# Patient Record
Sex: Female | Born: 1985
Health system: Southern US, Community
[De-identification: ages and names within clinical notes are randomized; demographics above are authoritative.]

## PROBLEM LIST (undated history)

## (undated) ENCOUNTER — Inpatient Hospital Stay (HOSPITAL_COMMUNITY): Payer: Self-pay

## (undated) ENCOUNTER — Emergency Department (HOSPITAL_COMMUNITY): Payer: Medicaid Other

## (undated) DIAGNOSIS — N390 Urinary tract infection, site not specified: Secondary | ICD-10-CM

## (undated) DIAGNOSIS — D649 Anemia, unspecified: Secondary | ICD-10-CM

## (undated) DIAGNOSIS — F329 Major depressive disorder, single episode, unspecified: Secondary | ICD-10-CM

## (undated) DIAGNOSIS — F32A Depression, unspecified: Secondary | ICD-10-CM

## (undated) DIAGNOSIS — A599 Trichomoniasis, unspecified: Secondary | ICD-10-CM

## (undated) DIAGNOSIS — R51 Headache: Secondary | ICD-10-CM

## (undated) DIAGNOSIS — A749 Chlamydial infection, unspecified: Secondary | ICD-10-CM

## (undated) DIAGNOSIS — O139 Gestational [pregnancy-induced] hypertension without significant proteinuria, unspecified trimester: Secondary | ICD-10-CM

## (undated) DIAGNOSIS — R87629 Unspecified abnormal cytological findings in specimens from vagina: Secondary | ICD-10-CM

## (undated) DIAGNOSIS — D219 Benign neoplasm of connective and other soft tissue, unspecified: Secondary | ICD-10-CM

## (undated) DIAGNOSIS — A549 Gonococcal infection, unspecified: Secondary | ICD-10-CM

## (undated) DIAGNOSIS — IMO0002 Reserved for concepts with insufficient information to code with codable children: Secondary | ICD-10-CM

## (undated) HISTORY — PX: GYNECOLOGIC CRYOSURGERY: SHX857

## (undated) HISTORY — PX: FOOT SURGERY: SHX648

## (undated) HISTORY — PX: LIPOSUCTION: SHX10

## (undated) HISTORY — PX: HAMMER TOE SURGERY: SHX385

---
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
## 2003-05-24 ENCOUNTER — Emergency Department (HOSPITAL_COMMUNITY): Admission: EM | Admit: 2003-05-24 | Discharge: 2003-05-24 | Payer: Self-pay | Admitting: Emergency Medicine

## 2003-06-11 ENCOUNTER — Emergency Department (HOSPITAL_COMMUNITY): Admission: EM | Admit: 2003-06-11 | Discharge: 2003-06-11 | Payer: Self-pay | Admitting: *Deleted

## 2003-06-11 ENCOUNTER — Encounter: Payer: Self-pay | Admitting: Emergency Medicine

## 2003-10-24 ENCOUNTER — Emergency Department (HOSPITAL_COMMUNITY): Admission: EM | Admit: 2003-10-24 | Discharge: 2003-10-24 | Payer: Self-pay | Admitting: Emergency Medicine

## 2003-12-24 ENCOUNTER — Emergency Department (HOSPITAL_COMMUNITY): Admission: EM | Admit: 2003-12-24 | Discharge: 2003-12-24 | Payer: Self-pay | Admitting: Emergency Medicine

## 2004-03-03 ENCOUNTER — Emergency Department (HOSPITAL_COMMUNITY): Admission: EM | Admit: 2004-03-03 | Discharge: 2004-03-03 | Payer: Self-pay | Admitting: Emergency Medicine

## 2004-09-20 ENCOUNTER — Emergency Department (HOSPITAL_COMMUNITY): Admission: EM | Admit: 2004-09-20 | Discharge: 2004-09-20 | Payer: Self-pay | Admitting: Family Medicine

## 2005-04-16 ENCOUNTER — Inpatient Hospital Stay (HOSPITAL_COMMUNITY): Admission: AD | Admit: 2005-04-16 | Discharge: 2005-04-16 | Payer: Self-pay

## 2005-05-16 ENCOUNTER — Other Ambulatory Visit: Admission: RE | Admit: 2005-05-16 | Discharge: 2005-05-16 | Payer: Self-pay | Admitting: Obstetrics and Gynecology

## 2005-05-27 ENCOUNTER — Inpatient Hospital Stay (HOSPITAL_COMMUNITY): Admission: AD | Admit: 2005-05-27 | Discharge: 2005-05-27 | Payer: Self-pay | Admitting: Obstetrics and Gynecology

## 2005-06-05 ENCOUNTER — Inpatient Hospital Stay (HOSPITAL_COMMUNITY): Admission: AD | Admit: 2005-06-05 | Discharge: 2005-06-05 | Payer: Self-pay | Admitting: Obstetrics and Gynecology

## 2005-09-23 ENCOUNTER — Inpatient Hospital Stay (HOSPITAL_COMMUNITY): Admission: AD | Admit: 2005-09-23 | Discharge: 2005-09-23 | Payer: Self-pay | Admitting: Obstetrics and Gynecology

## 2005-09-26 ENCOUNTER — Inpatient Hospital Stay (HOSPITAL_COMMUNITY): Admission: AD | Admit: 2005-09-26 | Discharge: 2005-09-26 | Payer: Self-pay | Admitting: Obstetrics and Gynecology

## 2005-09-27 ENCOUNTER — Inpatient Hospital Stay (HOSPITAL_COMMUNITY): Admission: AD | Admit: 2005-09-27 | Discharge: 2005-09-27 | Payer: Self-pay | Admitting: Obstetrics & Gynecology

## 2005-10-01 ENCOUNTER — Inpatient Hospital Stay (HOSPITAL_COMMUNITY): Admission: AD | Admit: 2005-10-01 | Discharge: 2005-10-01 | Payer: Self-pay | Admitting: Obstetrics and Gynecology

## 2005-10-09 ENCOUNTER — Encounter (INDEPENDENT_AMBULATORY_CARE_PROVIDER_SITE_OTHER): Payer: Self-pay | Admitting: *Deleted

## 2005-11-04 ENCOUNTER — Inpatient Hospital Stay (HOSPITAL_COMMUNITY): Admission: AD | Admit: 2005-11-04 | Discharge: 2005-11-04 | Payer: Self-pay | Admitting: Obstetrics and Gynecology

## 2005-11-05 ENCOUNTER — Inpatient Hospital Stay (HOSPITAL_COMMUNITY): Admission: AD | Admit: 2005-11-05 | Discharge: 2005-11-08 | Payer: Self-pay | Admitting: Obstetrics and Gynecology

## 2005-11-06 ENCOUNTER — Encounter (INDEPENDENT_AMBULATORY_CARE_PROVIDER_SITE_OTHER): Payer: Self-pay | Admitting: *Deleted

## 2006-01-09 ENCOUNTER — Inpatient Hospital Stay (HOSPITAL_COMMUNITY): Admission: AD | Admit: 2006-01-09 | Discharge: 2006-01-10 | Payer: Self-pay | Admitting: Obstetrics and Gynecology

## 2006-01-24 ENCOUNTER — Inpatient Hospital Stay (HOSPITAL_COMMUNITY): Admission: AD | Admit: 2006-01-24 | Discharge: 2006-01-24 | Payer: Self-pay | Admitting: Obstetrics & Gynecology

## 2006-03-08 ENCOUNTER — Ambulatory Visit: Payer: Self-pay | Admitting: Family Medicine

## 2006-08-08 ENCOUNTER — Ambulatory Visit: Payer: Self-pay | Admitting: Family Medicine

## 2006-09-13 ENCOUNTER — Ambulatory Visit: Payer: Self-pay | Admitting: *Deleted

## 2006-12-07 ENCOUNTER — Encounter (INDEPENDENT_AMBULATORY_CARE_PROVIDER_SITE_OTHER): Payer: Self-pay | Admitting: *Deleted

## 2007-01-31 ENCOUNTER — Emergency Department (HOSPITAL_COMMUNITY): Admission: EM | Admit: 2007-01-31 | Discharge: 2007-02-01 | Payer: Self-pay | Admitting: Emergency Medicine

## 2007-02-11 ENCOUNTER — Ambulatory Visit: Payer: Self-pay | Admitting: Sports Medicine

## 2007-02-11 DIAGNOSIS — J309 Allergic rhinitis, unspecified: Secondary | ICD-10-CM | POA: Insufficient documentation

## 2007-02-26 ENCOUNTER — Telehealth: Payer: Self-pay | Admitting: Psychology

## 2007-02-26 ENCOUNTER — Telehealth: Payer: Self-pay | Admitting: *Deleted

## 2007-03-08 ENCOUNTER — Encounter: Payer: Self-pay | Admitting: *Deleted

## 2007-05-07 ENCOUNTER — Telehealth: Payer: Self-pay | Admitting: *Deleted

## 2007-05-08 ENCOUNTER — Encounter (INDEPENDENT_AMBULATORY_CARE_PROVIDER_SITE_OTHER): Payer: Self-pay | Admitting: Family Medicine

## 2007-05-08 ENCOUNTER — Ambulatory Visit: Payer: Self-pay | Admitting: Family Medicine

## 2007-05-08 LAB — CONVERTED CEMR LAB
Bilirubin Urine: NEGATIVE
Glucose, Urine, Semiquant: NEGATIVE
Protein, U semiquant: NEGATIVE
pH: 6

## 2007-05-09 ENCOUNTER — Ambulatory Visit: Payer: Self-pay | Admitting: Family Medicine

## 2007-05-09 ENCOUNTER — Telehealth: Payer: Self-pay | Admitting: *Deleted

## 2007-05-27 ENCOUNTER — Telehealth: Payer: Self-pay | Admitting: *Deleted

## 2007-10-28 ENCOUNTER — Emergency Department (HOSPITAL_COMMUNITY): Admission: EM | Admit: 2007-10-28 | Discharge: 2007-10-28 | Payer: Self-pay | Admitting: Emergency Medicine

## 2007-12-10 ENCOUNTER — Inpatient Hospital Stay (HOSPITAL_COMMUNITY): Admission: AD | Admit: 2007-12-10 | Discharge: 2007-12-10 | Payer: Self-pay | Admitting: Obstetrics & Gynecology

## 2008-01-13 ENCOUNTER — Telehealth: Payer: Self-pay | Admitting: *Deleted

## 2008-01-28 ENCOUNTER — Ambulatory Visit: Payer: Self-pay | Admitting: Vascular Surgery

## 2008-01-28 ENCOUNTER — Telehealth: Payer: Self-pay | Admitting: *Deleted

## 2008-01-28 ENCOUNTER — Emergency Department (HOSPITAL_COMMUNITY): Admission: EM | Admit: 2008-01-28 | Discharge: 2008-01-28 | Payer: Self-pay | Admitting: Emergency Medicine

## 2008-01-28 ENCOUNTER — Encounter (INDEPENDENT_AMBULATORY_CARE_PROVIDER_SITE_OTHER): Payer: Self-pay | Admitting: Emergency Medicine

## 2008-02-09 ENCOUNTER — Emergency Department (HOSPITAL_COMMUNITY): Admission: EM | Admit: 2008-02-09 | Discharge: 2008-02-09 | Payer: Self-pay | Admitting: Emergency Medicine

## 2008-02-26 ENCOUNTER — Telehealth: Payer: Self-pay | Admitting: Family Medicine

## 2008-02-26 ENCOUNTER — Encounter: Payer: Self-pay | Admitting: *Deleted

## 2008-03-04 ENCOUNTER — Encounter (INDEPENDENT_AMBULATORY_CARE_PROVIDER_SITE_OTHER): Payer: Self-pay | Admitting: *Deleted

## 2008-03-04 ENCOUNTER — Other Ambulatory Visit: Admission: RE | Admit: 2008-03-04 | Discharge: 2008-03-04 | Payer: Self-pay | Admitting: Family Medicine

## 2008-03-04 ENCOUNTER — Ambulatory Visit: Payer: Self-pay | Admitting: Family Medicine

## 2008-03-04 DIAGNOSIS — E669 Obesity, unspecified: Secondary | ICD-10-CM | POA: Insufficient documentation

## 2008-03-05 ENCOUNTER — Encounter (INDEPENDENT_AMBULATORY_CARE_PROVIDER_SITE_OTHER): Payer: Self-pay | Admitting: *Deleted

## 2008-03-05 LAB — CONVERTED CEMR LAB
ALT: 11 units/L (ref 0–35)
CO2: 22 meq/L (ref 19–32)
Cholesterol: 204 mg/dL — ABNORMAL HIGH (ref 0–200)
GC Probe Amp, Genital: NEGATIVE
LDL Cholesterol: 134 mg/dL — ABNORMAL HIGH (ref 0–99)
Sodium: 138 meq/L (ref 135–145)
TSH: 1.464 microintl units/mL (ref 0.350–5.50)
Total Bilirubin: 0.4 mg/dL (ref 0.3–1.2)
Total Protein: 7.2 g/dL (ref 6.0–8.3)
VLDL: 22 mg/dL (ref 0–40)

## 2008-03-06 ENCOUNTER — Telehealth (INDEPENDENT_AMBULATORY_CARE_PROVIDER_SITE_OTHER): Payer: Self-pay | Admitting: *Deleted

## 2008-03-10 ENCOUNTER — Encounter (INDEPENDENT_AMBULATORY_CARE_PROVIDER_SITE_OTHER): Payer: Self-pay | Admitting: *Deleted

## 2008-03-15 ENCOUNTER — Emergency Department (HOSPITAL_COMMUNITY): Admission: EM | Admit: 2008-03-15 | Discharge: 2008-03-15 | Payer: Self-pay | Admitting: Emergency Medicine

## 2008-03-16 ENCOUNTER — Encounter (INDEPENDENT_AMBULATORY_CARE_PROVIDER_SITE_OTHER): Payer: Self-pay | Admitting: *Deleted

## 2008-06-25 ENCOUNTER — Telehealth: Payer: Self-pay | Admitting: Family Medicine

## 2008-07-06 ENCOUNTER — Encounter: Payer: Self-pay | Admitting: Family Medicine

## 2008-07-14 ENCOUNTER — Telehealth: Payer: Self-pay | Admitting: *Deleted

## 2008-07-16 ENCOUNTER — Ambulatory Visit: Payer: Self-pay | Admitting: Family Medicine

## 2008-07-16 ENCOUNTER — Encounter: Payer: Self-pay | Admitting: Family Medicine

## 2008-07-16 DIAGNOSIS — D649 Anemia, unspecified: Secondary | ICD-10-CM

## 2008-07-16 LAB — CONVERTED CEMR LAB
HCT: 36.9 % (ref 36.0–46.0)
Hemoglobin: 11.6 g/dL — ABNORMAL LOW (ref 12.0–15.0)
MCHC: 31.4 g/dL (ref 30.0–36.0)
MCV: 81.8 fL (ref 78.0–100.0)
Platelets: 259 10*3/uL (ref 150–400)
RBC: 4.51 M/uL (ref 3.87–5.11)
RDW: 13.1 % (ref 11.5–15.5)
WBC: 5.4 10*3/uL (ref 4.0–10.5)

## 2008-07-30 ENCOUNTER — Encounter: Payer: Self-pay | Admitting: *Deleted

## 2008-09-15 ENCOUNTER — Emergency Department (HOSPITAL_COMMUNITY): Admission: EM | Admit: 2008-09-15 | Discharge: 2008-09-15 | Payer: Self-pay | Admitting: Emergency Medicine

## 2008-11-26 ENCOUNTER — Inpatient Hospital Stay (HOSPITAL_COMMUNITY): Admission: AD | Admit: 2008-11-26 | Discharge: 2008-11-26 | Payer: Self-pay | Admitting: Family Medicine

## 2008-11-27 ENCOUNTER — Encounter: Payer: Self-pay | Admitting: Family Medicine

## 2008-11-29 ENCOUNTER — Inpatient Hospital Stay (HOSPITAL_COMMUNITY): Admission: AD | Admit: 2008-11-29 | Discharge: 2008-11-29 | Payer: Self-pay | Admitting: Obstetrics and Gynecology

## 2008-12-04 ENCOUNTER — Inpatient Hospital Stay (HOSPITAL_COMMUNITY): Admission: RE | Admit: 2008-12-04 | Discharge: 2008-12-04 | Payer: Self-pay | Admitting: Family Medicine

## 2008-12-15 ENCOUNTER — Other Ambulatory Visit: Admission: RE | Admit: 2008-12-15 | Discharge: 2008-12-15 | Payer: Self-pay | Admitting: Family Medicine

## 2008-12-15 ENCOUNTER — Encounter (INDEPENDENT_AMBULATORY_CARE_PROVIDER_SITE_OTHER): Payer: Self-pay | Admitting: Family Medicine

## 2008-12-15 ENCOUNTER — Telehealth: Payer: Self-pay | Admitting: Family Medicine

## 2008-12-15 ENCOUNTER — Ambulatory Visit: Payer: Self-pay | Admitting: Family Medicine

## 2008-12-15 LAB — CONVERTED CEMR LAB
Chlamydia, DNA Probe: NEGATIVE
Whiff Test: POSITIVE

## 2008-12-17 ENCOUNTER — Encounter (INDEPENDENT_AMBULATORY_CARE_PROVIDER_SITE_OTHER): Payer: Self-pay | Admitting: Family Medicine

## 2008-12-17 ENCOUNTER — Ambulatory Visit: Payer: Self-pay | Admitting: Family Medicine

## 2008-12-17 ENCOUNTER — Encounter: Payer: Self-pay | Admitting: Family Medicine

## 2008-12-17 LAB — CONVERTED CEMR LAB
Antibody Screen: NEGATIVE
Band Neutrophils: 0 % (ref 0–10)
Basophils Absolute: 0 10*3/uL (ref 0.0–0.1)
Basophils Relative: 0 % (ref 0–1)
Eosinophils Absolute: 0.1 10*3/uL (ref 0.0–0.7)
Eosinophils Relative: 1 % (ref 0–5)
HCT: 35.4 % — ABNORMAL LOW (ref 36.0–46.0)
Hemoglobin: 11.5 g/dL — ABNORMAL LOW (ref 12.0–15.0)
Hepatitis B Surface Ag: NEGATIVE
Lymphocytes Relative: 29 % (ref 12–46)
Lymphs Abs: 1.6 10*3/uL (ref 0.7–4.0)
MCHC: 32.5 g/dL (ref 30.0–36.0)
MCV: 81 fL (ref 78.0–100.0)
Monocytes Absolute: 0.5 10*3/uL (ref 0.1–1.0)
Monocytes Relative: 8 % (ref 3–12)
Neutro Abs: 3.4 10*3/uL (ref 1.7–7.7)
Neutrophils Relative %: 61 % (ref 43–77)
Platelets: 214 10*3/uL (ref 150–400)
RBC: 4.37 M/uL (ref 3.87–5.11)
RDW: 13.8 % (ref 11.5–15.5)
Rh Type: POSITIVE
Rubella: 186.8 intl units/mL — ABNORMAL HIGH
Sickle Cell Screen: NEGATIVE
WBC: 5.6 10*3/uL (ref 4.0–10.5)

## 2008-12-18 ENCOUNTER — Encounter: Payer: Self-pay | Admitting: Family Medicine

## 2008-12-24 ENCOUNTER — Ambulatory Visit: Payer: Self-pay | Admitting: Family Medicine

## 2008-12-24 LAB — CONVERTED CEMR LAB

## 2009-01-01 ENCOUNTER — Ambulatory Visit: Payer: Self-pay | Admitting: Family Medicine

## 2009-01-04 ENCOUNTER — Telehealth: Payer: Self-pay | Admitting: Family Medicine

## 2009-01-05 ENCOUNTER — Telehealth: Payer: Self-pay | Admitting: Family Medicine

## 2009-01-06 ENCOUNTER — Telehealth: Payer: Self-pay | Admitting: *Deleted

## 2009-01-06 ENCOUNTER — Encounter: Payer: Self-pay | Admitting: *Deleted

## 2009-01-07 ENCOUNTER — Encounter: Payer: Self-pay | Admitting: *Deleted

## 2009-01-18 ENCOUNTER — Telehealth: Payer: Self-pay | Admitting: Family Medicine

## 2009-01-18 ENCOUNTER — Ambulatory Visit: Payer: Self-pay | Admitting: Family Medicine

## 2009-01-18 ENCOUNTER — Inpatient Hospital Stay (HOSPITAL_COMMUNITY): Admission: AD | Admit: 2009-01-18 | Discharge: 2009-01-18 | Payer: Self-pay | Admitting: Family Medicine

## 2009-01-21 ENCOUNTER — Ambulatory Visit: Payer: Self-pay | Admitting: Family Medicine

## 2009-02-18 ENCOUNTER — Ambulatory Visit: Payer: Self-pay | Admitting: Obstetrics & Gynecology

## 2009-02-18 LAB — CONVERTED CEMR LAB: TSH: 0.919 microintl units/mL (ref 0.350–4.500)

## 2009-02-21 ENCOUNTER — Inpatient Hospital Stay (HOSPITAL_COMMUNITY): Admission: AD | Admit: 2009-02-21 | Discharge: 2009-02-21 | Payer: Self-pay | Admitting: Family Medicine

## 2009-03-09 ENCOUNTER — Ambulatory Visit (HOSPITAL_COMMUNITY): Admission: RE | Admit: 2009-03-09 | Discharge: 2009-03-09 | Payer: Self-pay | Admitting: Family Medicine

## 2009-03-11 ENCOUNTER — Emergency Department (HOSPITAL_COMMUNITY): Admission: EM | Admit: 2009-03-11 | Discharge: 2009-03-11 | Payer: Self-pay | Admitting: Emergency Medicine

## 2009-03-16 ENCOUNTER — Inpatient Hospital Stay (HOSPITAL_COMMUNITY): Admission: AD | Admit: 2009-03-16 | Discharge: 2009-03-16 | Payer: Self-pay | Admitting: Obstetrics & Gynecology

## 2009-03-16 ENCOUNTER — Ambulatory Visit: Payer: Self-pay | Admitting: Physician Assistant

## 2009-03-22 ENCOUNTER — Ambulatory Visit: Payer: Self-pay | Admitting: Obstetrics & Gynecology

## 2009-03-23 ENCOUNTER — Ambulatory Visit (HOSPITAL_COMMUNITY): Admission: RE | Admit: 2009-03-23 | Discharge: 2009-03-23 | Payer: Self-pay | Admitting: Obstetrics & Gynecology

## 2009-04-05 ENCOUNTER — Ambulatory Visit: Payer: Self-pay | Admitting: Obstetrics & Gynecology

## 2009-04-12 ENCOUNTER — Inpatient Hospital Stay (HOSPITAL_COMMUNITY): Admission: AD | Admit: 2009-04-12 | Discharge: 2009-04-12 | Payer: Self-pay | Admitting: Obstetrics & Gynecology

## 2009-04-15 ENCOUNTER — Ambulatory Visit (HOSPITAL_COMMUNITY): Admission: RE | Admit: 2009-04-15 | Discharge: 2009-04-15 | Payer: Self-pay | Admitting: Obstetrics & Gynecology

## 2009-04-22 ENCOUNTER — Ambulatory Visit: Payer: Self-pay | Admitting: Obstetrics & Gynecology

## 2009-04-29 ENCOUNTER — Ambulatory Visit (HOSPITAL_COMMUNITY): Admission: RE | Admit: 2009-04-29 | Discharge: 2009-04-29 | Payer: Self-pay | Admitting: Obstetrics & Gynecology

## 2009-05-01 ENCOUNTER — Inpatient Hospital Stay (HOSPITAL_COMMUNITY): Admission: AD | Admit: 2009-05-01 | Discharge: 2009-05-02 | Payer: Self-pay | Admitting: Obstetrics and Gynecology

## 2009-05-07 ENCOUNTER — Ambulatory Visit (HOSPITAL_COMMUNITY): Admission: RE | Admit: 2009-05-07 | Discharge: 2009-05-07 | Payer: Self-pay | Admitting: Obstetrics & Gynecology

## 2009-05-13 ENCOUNTER — Ambulatory Visit: Payer: Self-pay | Admitting: Obstetrics & Gynecology

## 2009-05-13 ENCOUNTER — Ambulatory Visit (HOSPITAL_COMMUNITY): Admission: RE | Admit: 2009-05-13 | Discharge: 2009-05-13 | Payer: Self-pay | Admitting: Obstetrics & Gynecology

## 2009-05-13 ENCOUNTER — Encounter: Payer: Self-pay | Admitting: Obstetrics & Gynecology

## 2009-05-13 LAB — CONVERTED CEMR LAB: Chlamydia, DNA Probe: NEGATIVE

## 2009-05-14 ENCOUNTER — Encounter: Payer: Self-pay | Admitting: Obstetrics & Gynecology

## 2009-05-14 LAB — CONVERTED CEMR LAB
Clue Cells Wet Prep HPF POC: NONE SEEN
Yeast Wet Prep HPF POC: NONE SEEN

## 2009-05-20 ENCOUNTER — Encounter (INDEPENDENT_AMBULATORY_CARE_PROVIDER_SITE_OTHER): Payer: Self-pay | Admitting: *Deleted

## 2009-05-20 ENCOUNTER — Ambulatory Visit: Payer: Self-pay | Admitting: Obstetrics & Gynecology

## 2009-05-20 LAB — CONVERTED CEMR LAB
ALT: 9 units/L (ref 0–35)
AST: 11 units/L (ref 0–37)
Albumin: 3.4 g/dL — ABNORMAL LOW (ref 3.5–5.2)
CO2: 21 meq/L (ref 19–32)
Calcium: 8.6 mg/dL (ref 8.4–10.5)
Chloride: 106 meq/L (ref 96–112)
Creatinine 24 HR UR: 1456 mg/24hr (ref 700–1800)
Creatinine, Ser: 0.55 mg/dL (ref 0.40–1.20)
HCT: 30.7 % — ABNORMAL LOW (ref 36.0–46.0)
Platelets: 206 10*3/uL (ref 150–400)
Potassium: 4 meq/L (ref 3.5–5.3)
RDW: 13.5 % (ref 11.5–15.5)
Total Protein: 6.3 g/dL (ref 6.0–8.3)
Uric Acid, Serum: 2.8 mg/dL (ref 2.4–7.0)
WBC: 7.1 10*3/uL (ref 4.0–10.5)

## 2009-05-21 ENCOUNTER — Ambulatory Visit: Payer: Self-pay | Admitting: Obstetrics & Gynecology

## 2009-05-21 ENCOUNTER — Ambulatory Visit (HOSPITAL_COMMUNITY): Admission: RE | Admit: 2009-05-21 | Discharge: 2009-05-21 | Payer: Self-pay | Admitting: Obstetrics & Gynecology

## 2009-05-24 ENCOUNTER — Ambulatory Visit: Payer: Self-pay | Admitting: Obstetrics & Gynecology

## 2009-05-28 ENCOUNTER — Ambulatory Visit (HOSPITAL_COMMUNITY): Admission: RE | Admit: 2009-05-28 | Discharge: 2009-05-28 | Payer: Self-pay | Admitting: Obstetrics & Gynecology

## 2009-05-31 ENCOUNTER — Encounter: Payer: Self-pay | Admitting: Obstetrics & Gynecology

## 2009-05-31 ENCOUNTER — Ambulatory Visit: Payer: Self-pay | Admitting: Obstetrics & Gynecology

## 2009-06-07 ENCOUNTER — Ambulatory Visit: Payer: Self-pay | Admitting: Obstetrics & Gynecology

## 2009-06-09 ENCOUNTER — Ambulatory Visit (HOSPITAL_COMMUNITY): Admission: RE | Admit: 2009-06-09 | Discharge: 2009-06-09 | Payer: Self-pay | Admitting: Obstetrics & Gynecology

## 2009-06-14 ENCOUNTER — Ambulatory Visit: Payer: Self-pay | Admitting: Obstetrics and Gynecology

## 2009-06-14 ENCOUNTER — Inpatient Hospital Stay (HOSPITAL_COMMUNITY): Admission: AD | Admit: 2009-06-14 | Discharge: 2009-06-14 | Payer: Self-pay | Admitting: Obstetrics & Gynecology

## 2009-06-17 ENCOUNTER — Ambulatory Visit (HOSPITAL_COMMUNITY): Admission: RE | Admit: 2009-06-17 | Discharge: 2009-06-17 | Payer: Self-pay | Admitting: Obstetrics & Gynecology

## 2009-06-17 ENCOUNTER — Ambulatory Visit: Payer: Self-pay | Admitting: Obstetrics and Gynecology

## 2009-06-17 ENCOUNTER — Inpatient Hospital Stay (HOSPITAL_COMMUNITY): Admission: AD | Admit: 2009-06-17 | Discharge: 2009-06-17 | Payer: Self-pay | Admitting: Family Medicine

## 2009-06-17 ENCOUNTER — Ambulatory Visit: Payer: Self-pay | Admitting: Obstetrics & Gynecology

## 2009-06-24 ENCOUNTER — Ambulatory Visit: Payer: Self-pay | Admitting: Obstetrics & Gynecology

## 2009-06-29 ENCOUNTER — Inpatient Hospital Stay (HOSPITAL_COMMUNITY): Admission: AD | Admit: 2009-06-29 | Discharge: 2009-06-29 | Payer: Self-pay | Admitting: Obstetrics & Gynecology

## 2009-06-29 ENCOUNTER — Ambulatory Visit: Payer: Self-pay | Admitting: Obstetrics and Gynecology

## 2009-07-01 ENCOUNTER — Ambulatory Visit (HOSPITAL_COMMUNITY): Admission: RE | Admit: 2009-07-01 | Discharge: 2009-07-01 | Payer: Self-pay | Admitting: Obstetrics & Gynecology

## 2009-07-01 ENCOUNTER — Ambulatory Visit: Payer: Self-pay | Admitting: Family Medicine

## 2009-07-01 ENCOUNTER — Encounter: Payer: Self-pay | Admitting: Family Medicine

## 2009-07-01 LAB — CONVERTED CEMR LAB
Chlamydia, DNA Probe: NEGATIVE
GC Probe Amp, Genital: NEGATIVE

## 2009-07-05 ENCOUNTER — Ambulatory Visit: Payer: Self-pay | Admitting: Family Medicine

## 2009-07-05 DIAGNOSIS — F4321 Adjustment disorder with depressed mood: Secondary | ICD-10-CM | POA: Insufficient documentation

## 2009-07-08 ENCOUNTER — Ambulatory Visit (HOSPITAL_COMMUNITY): Admission: RE | Admit: 2009-07-08 | Discharge: 2009-07-08 | Payer: Self-pay | Admitting: Obstetrics & Gynecology

## 2009-07-08 ENCOUNTER — Ambulatory Visit: Payer: Self-pay | Admitting: Obstetrics & Gynecology

## 2009-07-14 ENCOUNTER — Inpatient Hospital Stay (HOSPITAL_COMMUNITY): Admission: AD | Admit: 2009-07-14 | Discharge: 2009-07-17 | Payer: Self-pay | Admitting: Obstetrics & Gynecology

## 2009-07-17 ENCOUNTER — Inpatient Hospital Stay (HOSPITAL_COMMUNITY): Admission: AD | Admit: 2009-07-17 | Discharge: 2009-07-18 | Payer: Self-pay | Admitting: Obstetrics and Gynecology

## 2009-07-20 ENCOUNTER — Encounter: Payer: Self-pay | Admitting: *Deleted

## 2009-08-16 ENCOUNTER — Emergency Department (HOSPITAL_COMMUNITY): Admission: EM | Admit: 2009-08-16 | Discharge: 2009-08-16 | Payer: Self-pay | Admitting: Emergency Medicine

## 2009-08-23 ENCOUNTER — Ambulatory Visit: Payer: Self-pay | Admitting: Family Medicine

## 2009-08-23 LAB — CONVERTED CEMR LAB: Beta hcg, urine, semiquantitative: NEGATIVE

## 2010-10-24 ENCOUNTER — Emergency Department (HOSPITAL_COMMUNITY)
Admission: EM | Admit: 2010-10-24 | Discharge: 2010-10-25 | Payer: Self-pay | Source: Home / Self Care | Admitting: Emergency Medicine

## 2010-10-26 LAB — DIFFERENTIAL
Basophils Absolute: 0 10*3/uL (ref 0.0–0.1)
Basophils Relative: 0 % (ref 0–1)
Eosinophils Absolute: 0.1 10*3/uL (ref 0.0–0.7)
Eosinophils Relative: 1 % (ref 0–5)
Lymphocytes Relative: 36 % (ref 12–46)
Lymphs Abs: 2.4 10*3/uL (ref 0.7–4.0)
Monocytes Absolute: 0.5 10*3/uL (ref 0.1–1.0)
Monocytes Relative: 8 % (ref 3–12)
Neutro Abs: 3.5 10*3/uL (ref 1.7–7.7)
Neutrophils Relative %: 54 % (ref 43–77)

## 2010-10-26 LAB — URINALYSIS, ROUTINE W REFLEX MICROSCOPIC
Bilirubin Urine: NEGATIVE
Hgb urine dipstick: NEGATIVE
Ketones, ur: NEGATIVE mg/dL
Nitrite: NEGATIVE
Protein, ur: NEGATIVE mg/dL
Specific Gravity, Urine: 1.031 — ABNORMAL HIGH (ref 1.005–1.030)
Urine Glucose, Fasting: NEGATIVE mg/dL
Urobilinogen, UA: 1 mg/dL (ref 0.0–1.0)
pH: 5.5 (ref 5.0–8.0)

## 2010-10-26 LAB — POCT CARDIAC MARKERS
CKMB, poc: <1 ng/mL — ABNORMAL LOW (ref 1.0–8.0)
CKMB, poc: <1 ng/mL — ABNORMAL LOW (ref 1.0–8.0)
Myoglobin, poc: 33.6 ng/mL (ref 12–200)
Myoglobin, poc: 35.8 ng/mL (ref 12–200)
Troponin i, poc: <0.05 ng/mL (ref 0.00–0.09)
Troponin i, poc: <0.05 ng/mL (ref 0.00–0.09)

## 2010-10-26 LAB — RAPID URINE DRUG SCREEN, HOSP PERFORMED
Amphetamines: NOT DETECTED
Barbiturates: NOT DETECTED
Benzodiazepines: NOT DETECTED
Cocaine: NOT DETECTED
Opiates: NOT DETECTED
Tetrahydrocannabinol: NOT DETECTED

## 2010-10-26 LAB — CBC
HCT: 36.9 % (ref 36.0–46.0)
Hemoglobin: 11.7 g/dL — ABNORMAL LOW (ref 12.0–15.0)
MCH: 25.7 pg — ABNORMAL LOW (ref 26.0–34.0)
MCHC: 31.7 g/dL (ref 30.0–36.0)
MCV: 81.1 fL (ref 78.0–100.0)
Platelets: 236 10*3/uL (ref 150–400)
RBC: 4.55 MIL/uL (ref 3.87–5.11)
RDW: 13 % (ref 11.5–15.5)
WBC: 6.5 10*3/uL (ref 4.0–10.5)

## 2010-10-26 LAB — BASIC METABOLIC PANEL
BUN: 10 mg/dL (ref 6–23)
CO2: 27 mEq/L (ref 19–32)
Calcium: 9.2 mg/dL (ref 8.4–10.5)
Chloride: 104 mEq/L (ref 96–112)
Creatinine, Ser: 0.73 mg/dL (ref 0.4–1.2)
GFR calc Af Amer: >60 mL/min (ref >60)
GFR calc non Af Amer: >60 mL/min (ref >60)
Glucose, Bld: 84 mg/dL (ref 70–99)
Potassium: 3.7 mEq/L (ref 3.5–5.1)
Sodium: 139 mEq/L (ref 135–145)

## 2010-10-26 LAB — PROTIME-INR
INR: 0.98 (ref 0.00–1.49)
Prothrombin Time: 13.2 seconds (ref 11.6–15.2)

## 2010-10-26 LAB — POCT PREGNANCY, URINE: Preg Test, Ur: NEGATIVE

## 2010-10-26 LAB — D-DIMER, QUANTITATIVE: D-Dimer, Quant: <0.22 ug/mL-FEU (ref 0.00–0.48)

## 2010-10-30 ENCOUNTER — Encounter: Payer: Self-pay | Admitting: *Deleted

## 2010-10-31 ENCOUNTER — Encounter: Payer: Self-pay | Admitting: *Deleted

## 2010-11-08 NOTE — Assessment & Plan Note (Signed)
Summary: 6 WK PP/KH   Vital Signs:  Patient profile:   25 year old female Weight:      186.1 pounds Pulse rate:   71 / minute BP sitting:   130 / 92  (right arm)  Vitals Entered By: Arlyss Repress CMA, (August 23, 2009 2:51 PM) CC: post partum check Is Patient Diabetic? No Pain Assessment Patient in pain? no        Primary Care Provider:  Helane Rima DO  CC:  post partum check.  History of Present Illness: 25 y/o  G2P2002, f/u for 6 week post-partum check:  1. Bleeding: had menses-like bleeding for last 3 days, ending today. Hgb 9.4 at 28 weeks, still taking PNV. 2. GU: no concerns, denies dysuria and incontinence. 3. Depression: no SI/HI, has family support, coping when overwhelmed, not on meds. 4. Contraception: wants Depo bridge to IUD. 5. Feeding: breast/bottle. No concerns.  Habits & Providers  Alcohol-Tobacco-Diet     Tobacco Status: never  Current Medications (verified): 1)  Prenatal Vitamins 0.8 Mg Tabs (Prenatal Multivit-Min-Fe-Fa) .Marland Kitchen.. 1 By Mouth Daily 2)  Ferrous Fumarate 325 Mg Tabs (Ferrous Fumarate) .Marland Kitchen.. 1 By Mouth Daily 3)  Colace 100 Mg Caps (Docusate Sodium) .Marland Kitchen.. 1 By Mouth 2 Times Daily As Needed For Constipation 4)  Monistat 1 Combo Pack 1200-2 Mg-% Kit (Miconazole Nitrate) .... Per Package Instructions  Allergies (verified): No Known Drug Allergies  Review of Systems General:  Denies chills and fever. GI:  Denies constipation. GU:  Denies discharge, dysuria, and incontinence. Psych:  Denies anxiety, depression, easily angered, easily tearful, suicidal thoughts/plans, and thoughts /plans of harming others.  Physical Exam  General:  Well-developed,well-nourished,in no acute distress; alert,appropriate and cooperative throughout examination. vitals reviewed. Neck:  No deformities, masses, or tenderness noted. Lungs:  Normal respiratory effort, chest expands symmetrically. Lungs are clear to auscultation, no crackles or wheezes. Heart:   Normal rate and regular rhythm. S1 and S2 normal without gallop, murmur, click, rub or other extra sounds. Abdomen:  Bowel sounds positive,abdomen soft and non-tender without masses, organomegaly or hernias noted. Genitalia:  normal introitus, no external lesions, no vaginal discharge, normal uterus size and position, and no adnexal masses or tenderness.   Extremities:  No edema. Psych:  normally interactive, good eye contact, and not anxious appearing.     Impression & Recommendations:  Problem # 1:  PREGNANCY, NORMAL (ICD-V22.2) Assessment Unchanged  Doing well. Anticipatory guidance given and questions answered.  [facility]  Problem # 2:  CONTRACEPTIVE MANAGEMENT (ICD-V25.09) Assessment: Unchanged Referred to HD for Implanon. Orders: U Preg-[facility] ([id number]) Depo-Provera 150mg  (J1055) Postpartum visit- [facility] (62952)  Complete Medication List: 1)  Prenatal Vitamins 0.8 Mg Tabs (Prenatal multivit-min-fe-fa) .Marland Kitchen.. 1 by mouth daily 2)  Ferrous Fumarate 325 Mg Tabs (Ferrous fumarate) .Marland Kitchen.. 1 by mouth daily 3)  Colace 100 Mg Caps (Docusate sodium) .Marland Kitchen.. 1 by mouth 2 times daily as needed for constipation 4)  Monistat 1 Combo Pack 1200-2 Mg-% Kit (Miconazole nitrate) .... Per package instructions  Patient Instructions: 1)  It was great to see you today! 2)  You may resume intercourse. 3)  You may resume working. 4)  Let me know if you have any questions or concerns!  Laboratory Results   Urine Tests  Date/Time Received: August 23, 2009 3:11 PM  Date/Time Reported: August 23, 2009 3:16 PM     Urine HCG: negative Comments: ...............test performed by......Marland KitchenBonnie A. [place], MLS (ASCP)cm       Medication  Administration  Injection # 1:    Medication: Depo-Provera 150mg     Diagnosis: CONTRACEPTIVE MANAGEMENT (ICD-V25.09)    Route: IM    Site: LUOQ gluteus    Exp Date: 12/08/2011    Lot #: [id number]    Mfr: Ty Hilts    Comments: NEXT DEPO  DUE 11-08-09 THROUGH 11-22-2008    Patient tolerated injection without complications    Given by: Arlyss Repress CMA, (August 23, 2009 3:41 PM)  Orders Added: 1)  U Preg-[facility] [[id number]] 2)  Depo-Provera 150mg  [J1055] 3)  Postpartum visit- [facility] [[id number]]

## 2010-11-08 NOTE — Assessment & Plan Note (Signed)
Summary: PE  /  DPG   Vital Signs:  Patient Profile:   25 Years Old Female Height:     65 inches Weight:      182.3 pounds Temp:     98.2 degrees F Pulse rate:   72 / minute BP sitting:   120 / 83  (left arm)  Pt. in pain?   no  Vitals Entered By: Alphia Kava (Mar 04, 2008 8:32 AM)              Is Patient Diabetic? Yes      Chief Complaint:  CPP.  History of Present Illness: 25 y/o AAF with multiple issues:  1) PAP - due for this.  Last that I can tell is 2004.  History of abnormal paps and colpo in the early 2000s, but most recent PAP normal.  LMP right now.  2) Family planning - sexually active with one person.  Doesn't want to become pregnant, but not on birth control.  Doesn't want hormonal birth control.  Has a history of multiple STDs, last 2008.  3) Allergic rhinitis - interested in an allergist referral.  has tried OTc nasal sprays and claritin and benardryl and zyrtec without improvement, but hasn't tried nasal steroids.  Had antibiotics earlier this month for sinus infection that helped the congestion/drainage, but she has chronic itchy eyes and nose and sneezing.  4) Obesity - gained weight since last visit (6 lbs).  She attributes it to stress and her job although she is out of work right now.  She doesn't currently exericse, but has been to the [facility] in the past.  Not interested in dietary counselling unless she has diabetes.  5) skin discoloration of nipple - Has a new small hyperpigmented are on her right nipple at about 3 o'clock that she just noticed over the last week.  Nontender.  No change noted.    Past Medical History:    h/o abnl paps (2/03, 7/02) with colpo/cryo (7/02)--> normal PAP 2004    h/o recurrant BV    h/o multiple STDs    gonorrhea in 4/08, trichomonas 4/08 treated.      OBESITY (ICD-278.00)    RHINITIS, ALLERGIC NOS (ICD-477.9)    DISORDER, EPISODIC MOOD NOS (ICD-296.90)       Family History:    Father deceased from gunshot wound.     HTN - mother Kadie Balestrieri)  Social History:    Mother Jari Sportsman) who works in the [employer].   Denies smoking, ETOH, drugs.     Physical Exam  General:     Morbidly obese AAF with no acute distress, alert and oriented x 3 and cooperative Mouth:     Oral mucosa and oropharynx without lesions or exudates.  Teeth in good repair. Neck:     No deformities, masses, or tenderness noted. Breasts:     No mass, nodules, thickening, tenderness, bulging, retraction, inflamation, nipple discharge or skin changes noted.    a 1 cm diameter nonpalpable hyperpigmentation at 3 o'clock of her right breast.  Lungs:     Normal respiratory effort, chest expands symmetrically. Lungs are clear to auscultation, no crackles or wheezes. Heart:     Normal rate and regular rhythm. S1 and S2 normal without gallop, murmur, click, rub or other extra sounds. Abdomen:     Bowel sounds positive,abdomen soft and non-tender without masses, organomegaly or hernias noted. Genitalia:     Normal introitus for age, no external lesions, no vaginal discharge, blood  in cervical os (on her period), mucosa pink and moist, no vaginal or cervical lesions, no vaginal atrophy, no friaility or hemorrhage, normal uterus size and position, no adnexal masses or tenderness Msk:     No deformity or scoliosis noted of thoracic or lumbar spine.   Pulses:     2+ DP pulses Extremities:     No clubbing, cyanosis, edema, or deformity noted with normal full range of motion of all joints.   Neurologic:     No cranial nerve deficits noted. Station and gait are normal. Plantar reflexes are down-going bilaterally. DTRs are symmetrical throughout. Sensory, motor and coordinative functions appear intact. Skin:     Intact without suspicious lesions or rashes Psych:     Cognition and judgment appear intact. Alert and cooperative with normal attention span and concentration. No apparent delusions, illusions,  hallucinations    Impression & Recommendations:  Problem # 1:  CERVICAL DYSPLASIA (ICD-622.1) Pap smear performed.  As stated, last was normal, but it has been since 2004.  Ideally we would have not done on her period day, but given her infrequent visits and extended time since last visit, we decided to proceed.  Orders: Pap Smear-[facility] ([id number]) [facility] - Est  18-39 yrs ([id number])   Problem # 2:  OBESITY (ICD-278.00) Assessment: Deteriorated Screening labs.  Advised to adjust diet and exercise 5 days/week for 45 minutes.  Orders: Comp Met-[facility] [id number]) Lipid-[facility] [id number]) TSH-[facility] [phone]) [facility] - Est  18-39 yrs ([id number])   Problem # 3:  NEVUS, NON-NEOPLASTIC (ICD-448.1) Assessment: New Her nipple skin lesion does not appear to be malignant or infections.  I think it is a benign nevus.  If it increases in size or changes in any other way, she will return for biopsy. Orders: [facility] - Est  18-39 yrs ([id number])   Problem # 4:  EXPOSURE TO VIRAL DISEASE, NEC (ICD-V01.79) Assessment: Unchanged STD screening.  trich negative.  Orders: HIV-[facility] ([id number]) RPR-[facility] [phone]) GC/Chlamydia-[facility] ([id number]) Wet Prep- [facility] ([id number]) [facility] - Est  18-39 yrs ([id number])   Problem # 5:  FAMILY PLANNING (ICD-V25.09) Assessment: Deteriorated Given refusal to use hormonal contraception, we will consider copper IUD.  She has a history of STDs in the past though, so we will discuss risks and benefits at return appointment.  She will use condoms in the mean time. Orders: [facility] - Est  18-39 yrs ([id number])   Complete Medication List: 1)  Metronidazole 500 Mg Tabs (Metronidazole) .... Take 4 tablets all at once for infection. 2)  Flonase 50 Mcg/act Susp (Fluticasone propionate) .... 2 sprays in each nostril daily    Prescriptions: FLONASE 50 MCG/ACT  SUSP (FLUTICASONE PROPIONATE) 2 sprays in each nostril daily  #1 x 11   Entered and Authorized by:   Angeline Slim MD   Signed by:   Terese Door on 03/04/2008   Method used:   Electronically sent to ...       [facility] [address] [phone]*       [address]       [place], [place]  [address]       Ph: [phone]       Fax: [phone]   RxID:   [id number]  ] Laboratory Results  Date/Time Received: Mar 04, 2008 9:29 AM  Date/Time Reported: Mar 04, 2008 9:39 AM   [employer] Source: VAGINAL WBC/hpf: OCC Bacteria/hpf: 2+ Clue cells/hpf: few Yeast/hpf: none Trichomonas/hpf: none Comments: >20 RBC's present ...........test  performed by...........Marland KitchenTerese Door, CMA

## 2010-11-08 NOTE — Assessment & Plan Note (Signed)
Summary: NOB/DSL   Vital Signs:  Patient profile:   25 year old female LMP:     10/23/2008 Weight:      190 pounds BP sitting:   130 / 80  Vitals Entered By: Arlyss Repress CMA, (December 24, 2008 1:47 PM)  History of Present Illness: Christine Cervantes is a 25 year old G41P0101. LMP 10/23/08 with EDC 08/02/09. Confirmed with 5 week [place] on 12/04/08 [facility] 07/30/09). Presents for New OB Visit. FOB with her today.  1. Hx Depression: Not currently on medication. She admits that she is unhappy about being pregnant. She did not want to have a baby and she has had a tougher time during this pregnancy in regards to nausea, fatigue, and mood swings. FOB says that he worries about her mood swings. He is happy about the pregnancy and wants to be involved in the child's life.   2. Anemia: Patient was seen in 10/09 for fatigue/depression. Hgb 11.6, MCV 81.8. Rx FeSO4/Colace, but never started.     Habits & Providers     Alcohol drinks/day: 0     Alcohol Counseling: not indicated; patient does not drink     Tobacco Status: never     Tobacco Counseling: not indicated; no tobacco use     Cigarette Packs/Day: n/a     Have you felt down or hopeless? yes     Have you felt little pleasure in things? yes     Depression Counseling: further diagnostic testing and/or other treatment is indicated     STD Risk: past     STD Risk Counseling: not indicated-no STD risk noted     Contraception Counseling: not applicable     Drug Use: never     Seat Belt Use: always     Sun Exposure: infrequent  Current Medications (verified): 1)  Prenatal Vitamins 0.8 Mg Tabs (Prenatal Multivit-Min-Fe-Fa) .Marland Kitchen.. 1 By Mouth Daily 2)  Ferrous Fumarate 325 Mg Tabs (Ferrous Fumarate) .Marland Kitchen.. 1 By Mouth Daily 3)  Colace 100 Mg Caps (Docusate Sodium) .Marland Kitchen.. 1 By Mouth 2 Times Daily As Needed For Constipation  Allergies (verified): No Known Drug Allergies  Past History:  Past Medical History:    H/O abnl paps (2/03, 7/02) with colpo/cryo (7/02)  and normal PAP 2004, 2010    H/O recurrant BV    H/O multiple STDs    Gonorrhea in 4/08, Trichomonas 4/08 - Treated      Obesity     Allergic Rhinitis    Depression    Anemia  Social History:    Occupation:  CNA    EducationNetwork engineer    Packs/Day:  n/a    Hepatitis Risk:  no    STD Risk:  past    Drug Use:  never    Risk analyst Use:  always    Sun Exposure-Excessive:  infrequent  Review of Systems General:  Complains of fatigue; denies chills, fever, loss of appetite, malaise, and sleep disorder. GI:  Complains of nausea and vomiting; denies abdominal pain, constipation, diarrhea, indigestion, loss of appetite, and vomiting blood. GU:  Denies abnormal vaginal bleeding, discharge, dysuria, genital sores, urinary frequency, and urinary hesitancy. Psych:  Complains of depression and irritability; denies sense of great danger, suicidal thoughts/plans, thoughts of violence, and thoughts /plans of harming others.  Physical Exam  General:  Overwieght AAF with no acute distress, alert and oriented x 3 and cooperative. Head:  Normocephalic and atraumatic without obvious abnormalities. Eyes:  Vision grossly intact, pupils equal,  pupils round, and pupils reactive to light.   Ears:  R ear normal and L ear normal.   Nose:  No external deformity.   Mouth:  Oral mucosa and oropharynx without lesions or exudates.  Teeth in good repair. Neck:  No deformities, masses, or tenderness noted. Lungs:  Normal respiratory effort, chest expands symmetrically. Lungs are clear to auscultation, no crackles or wheezes. Heart:  Normal rate and regular rhythm. S1 and S2 normal without gallop, murmur, click, rub or other extra sounds. Abdomen:  Bowel sounds positive,abdomen soft and non-tender. Pulses:  R and L carotid, dorsalis pedis, and posterior tibial pulses are full and equal bilaterally. Extremities:  No clubbing, cyanosis, edema, or deformity noted with normal full range of motion of all joints.     Neurologic:  No cranial nerve deficits noted. Station and gait are normal. DTRs are symmetrical throughout. Sensory, motor and coordinative functions appear intact. Skin:  Intact without suspicious lesions or rashes. Psych:  Flat affect, lethargic, Oriented X 3 and normally interactive.     Impression & Recommendations:  Problem # 1:  PREGNANCY, NORMAL (ICD-V22.2) Assessment Unchanged  LMP conistent with 8.[redacted] weeks GA. Patient with nausea but able to keep down food and liquid. PNV causes nausea, advised Flinstone Vitamins at night.  Mom with diet-controlled diabetes, so will schedule one hour glucola (patient not able to stay today).   As patient is unhappy about her pregnancy and is still fairly early, I discussed the patient's options with her and FOB including: continuing with the pregnancy with as much support (in terms of community programs) as we could find, adoption, and termination. At this time, patient wishes to continue with pregancy with intent to keep.   OB US on 12/04/08 at 5 wk 4 d confimed [facility] 07/30/09. Intrauterine sac visualized. Hgb 11.5 (12/17/08) Hct 35.4 (12/17/08) Platelets (12/17/08) HepB Negative RPR NR Rubella Immune Sickle Cell Negative HIV NR O Positive, Antibody Negative Urine Culture Negative (12/17/08) GC/Chlam Negative (12/15/08) PAP Negative (12/15/08) Received Seasonal FluVax  Orders: Medicaid OB visit - [facility] ([id number])  Problem # 2:  DEPRESSIVE DISORDER (ICD-311) Assessment: Deteriorated This patient was seen 07/16/08 for depression. Today, she has a flat affect and raises concerns about her pregnancy. We discussed her concerns. No evidence of SI/HI. She is not interested in therapy at this time.  Problem # 3:  ANEMIA (ICD-285.9) Assessment: Unchanged  Her updated medication list for this problem includes:    Ferrous Fumarate 325 Mg Tabs (Ferrous fumarate) .Marland Kitchen... 1 by mouth daily  Complete Medication List: 1)  Prenatal Vitamins 0.8 Mg Tabs (Prenatal  multivit-min-fe-fa) .Marland Kitchen.. 1 by mouth daily 2)  Ferrous Fumarate 325 Mg Tabs (Ferrous fumarate) .Marland Kitchen.. 1 by mouth daily 3)  Colace 100 Mg Caps (Docusate sodium) .Marland Kitchen.. 1 by mouth 2 times daily as needed for constipation  Patient Instructions: 1)  Please schedule a follow-up appointment in 1 month.  2)  If you have any concerns or questions, please call the clinic. If you have bleeding, cramping, or contractions please go to the MAU. 3)  Take 1-2 Flinstone Vitamins at night. 4)  Take Iron Pills as directed. Make sure to get lots of fiber in your diet since they can cause constipation. I will also prescribe colace in case you need it for constipation. 5)  The medication list was reviewed and reconciled.  All changed/ newly prescribed medications were explained.  A complete medication list was provided to the patient/ caregiver. Prescriptions: COLACE 100 MG CAPS (DOCUSATE  SODIUM) 1 by mouth 2 times daily as needed for constipation  #180 x 3   Entered and Authorized by:   Helane Rima MD   Signed by:   Helane Rima MD on 12/24/2008   Method used:   Electronically to        [facility] [address] [phone]* (retail)       [address]       [place], [place]  [address]       Ph: [phone] or [phone]       Fax: [phone]   RxID:   [id number] FERROUS FUMARATE 325 MG TABS (FERROUS FUMARATE) 1 by mouth daily  #90 x 3   Entered and Authorized by:   Helane Rima MD   Signed by:   Helane Rima MD on 12/24/2008   Method used:   Electronically to        [facility] [address] [phone]* (retail)       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  Menstrual History    LMP (date): 10/23/2008    EDC by LMP: 07/30/2009    Best Working EDC: 07/30/2009    LMP - Character: normal    LMP - Reliable? : Yes    Menarche: 9 years    Menses interval: 28 days    Menstrual flow 3-4 days    On BCP's at conception: no    Date of positive (+) home preg. test: 11/18/2008    Pre Pregnancy Weight: 193 lbs.    Symptoms since LMP: amenorrhea, nausea, vomiting, fatigue, tender breasts, urinary frequency   Flowsheet View for Follow-up Visit    Estimated weeks of       gestation:     8 6/7    Weight:     190    Blood pressure:   130 / 80    Headache:     No    Nausea/vomiting:   nausea    Edema:     0    Vaginal bleeding:   no    Vaginal discharge:   no    FHR:       Too Early    Fetal activity:     no    Labor symptoms:   no    Taking prenatal vits?   N    Smoking:     n/a    Next visit:     4 wk    Resident:     EW    Preceptor:     Chambliss   Flowsheet View for Follow-up Visit    Estimated weeks of  gestation:     8 6/7    Weight:     190    Blood pressure:   130 / 80    Hx headache?     No    Nausea/vomiting?   nausea    Edema?     0    Bleeding?     no    Leakage/discharge?   no    Fetal activity:       no    Labor symptoms?   no    FHR:       Too Early    Taking Vitamins?   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  Menstrual History    LMP (date): 10/23/2008    EDC by LMP: 07/30/2009    Best Working EDC: 07/30/2009    LMP - Character: normal    LMP - Reliable? : Yes    Menarche: 9 years    Menses interval: 28 days    Menstrual flow 3-4 days    On BCP's at conception: no    Date of positive (+) home preg. test: 11/18/2008    Pre Pregnancy Weight: 193 lbs.    Symptoms since LMP: amenorrhea, nausea, vomiting, fatigue, tender breasts, urinary frequency   Prenatal Visit    FOB name: Earl Lites [facility] Confirmation:    New working [facility]: 07/30/2009    LMP reliable? Yes    Last menses onset (LMP) date: 10/23/2008    EDC by LMP: 07/30/2009 Ultrasound Dating Information:    First U/S on 12/04/2008   Gest age: 35 wk 4 d   EDC: 08/02/2009.   Past Pregnancy History    Gravida:     2    Term Births:     0    Premature Births:   1    Living Children:   1    Para:       1    Mult. Births:     0    Prev C-Section:   0    Aborta:     0    Elect. Ab:     0    Spont. Ab:     0    Ectopics:     0  Pregnancy # 1    Delivery date:     11/06/2005    Weeks Gestation:   36    Preterm labor:     yes    Delivery type:     NSVD    Anesthesia type:     epidural    Delivery location:     [facility]    Infant Sex:     Female    Birth weight:     6 lbs 7 oz    Name:  Elijah    Comments:     No NICU. No intubations.   Genetic History    Father of baby:   Earl Lites     Thalassemia:     mother: no   father: no    Neural tube defect:   mother: no   father: no    Down's Syndrome:   mother: no   father: no    Tay-Sachs:     mother: no   father: no    Sickle Cell Dz/Trait:   mother: no   father: no    Hemophilia:     mother: no   father: no    Muscular Dystrophy:   mother: no   father: no    Cystic Fibrosis:   mother: no   father: no    Huntington's Dz:   mother: no   father: no     Mental Retardation:   mother: no   father: no    Fragile X:     mother: no   father: no    Other Genetic or       Chromosomal Dz:   mother: no   father: no    Child with other       birth defect:     mother: no   father: no    > 3 spont. abortions:   mother: no    Hx of stillbirth:     mother: no  Infection Risk History    High Risk Hepatitis B: no    Immunized against Hepatitis B: no    Exposure to TB: no    Patient with history of Genital Herpes: no    Sexual partner with history of Genital Herpes: no    History of STD (GC, Chlamydia, Syphilis, HPV): yes    Specific STD: Chlamydia, Gonorrhea, Trich    Rash, Viral, or Febrile Illness since LMP: no    Exposure to Cat Litter: no    Chicken Pox Immune Status: Hx of Disease: Immune    History of Parvovirus (Fifth Disease): no    Occupational Exposure to Children: none  Environmental Exposures    Xray Exposure since LMP: no    Chemical or other exposure: no    Medication, drug, or alcohol use since LMP: no  Additional Infection/Environmental Comments:    Patient uncomfortable speaking about infection history in front of FOB.    Appended Document: Orders Update     Clinical Lists Changes  Orders: Added new Referral order of Obstetric Referral (Obstetric) - Signed

## 2010-11-08 NOTE — Progress Notes (Signed)
Summary: OB - severe cramps   Phone Note Call from Patient Call back at Home Phone [phone]   Caller: Patient Summary of Call: pt at home [phone] is about [redacted] weeks pregnant and started cramping last night, pt describes pain level 8-9.   Initial call taken by: Rae Roam,  December 15, 2008 8:40 AM  Follow-up for Phone Call        c/o discharge x 1 week. clear with an odor. was dark brown yesterday. states the cramps are severe. advised tylenol. she wants to be seen. appt at 11am with Dr. Luz Brazen Follow-up by: Golden Circle RN,  December 15, 2008 8:44 AM

## 2010-11-08 NOTE — Progress Notes (Signed)
Summary: triage   Phone Note Call from Patient Call back at Home Phone [phone]   Caller: Patient Summary of Call: has pain on left side - off and on - dull ache going down her leg Initial call taken by: De Nurse,  January 05, 2009 8:45 AM  Follow-up for Phone Call        c/o pain in ruq. states she has spoken to her doctor about this before. started again last night. comes & goes. 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 has not tried any tylenol. wants to be checked. She is pregnant. appt this am with Dr. Mauricio Po Follow-up by: Golden Circle RN,  January 05, 2009 8:50 AM

## 2010-11-08 NOTE — Progress Notes (Signed)
Summary: referral   Phone Note Call from Patient Call back at Home Phone [phone] Call back at [phone]   Caller: Patient Summary of Call: pt wants to be referred to an OBGYN to continue w/ prenatal care Initial call taken by: De Nurse,  January 04, 2009 10:33 AM  Follow-up for Phone Call        fwd. to dr.Rayanna Matusik Follow-up by: Arlyss Repress CMA,,  January 04, 2009 11:06 AM    Patient has history of preterm labor and may require 17P injections. This can be done at the [facility]. Will forward to Dr. Mauricio Po as he is seeing her this am in the clinic.

## 2010-11-08 NOTE — Progress Notes (Signed)
Summary: PE FORM   Phone Note Call from Patient Call back at Home Phone [phone]   Caller: Patient Summary of Call: PT NEEDS LAST PHYSICAL COMPLETED ON ONE OF OUR SPORTS PE FORMS FOR WORK.  PLEASE CONTACT PT WHEN FORM IS READY FOR PICKUP Initial call taken by: Dedra Skeens CMA,,  June 25, 2008 9:18 AM  Follow-up for Phone Call        Clinical portion done, placed in MD box for completion. Follow-up by: ASHA BENTON LPN,  June 25, 2008 9:36 AM

## 2010-11-08 NOTE — Assessment & Plan Note (Signed)
Summary: ob wants to discuss some things with Dr Earlene Plater   Primary Care Provider:  Helane Rima DO  CC:  depression.  History of Present Illness: 25 yo AAF with:   1. 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 she is worried about her finances, especially with Christmas approaching. Christine Cervantes has several financial aid programs in place including Medicaid, WIC, and food stamps. FOB is involved with pregnancy "when he isn't making me mad." she does have family support Christine Cervantes, also one of my patients, is her mother). Christine Cervantes had similar c/o (again, when she was out of work) prior to becoming pregnant and was prescribed Zoloft which helped, but she stopped as soon as she found out that she was pregnant. she denies a PMHx of post partum depression. she does want to breastfeed. she believes that she will feel better after the baby is born and she can work again. she denies SI/HI.   2. High Risk Pregnancy: G2P0101. LMP 10/23/08 with EDC 08/02/09, confirmed with 5 week [place]. Christine Cervantes has been followed at the [facility] since our inital OB visit. she asks today if I would deliver the baby. she has been told by MFM that she may be induced by 39 weeks. she has already received betamethasone x 2 (August). she states that the baby has a "tumor by its heart" and that she has seen 2 specialists. her son is a patient at the [facility] and she would like for her new baby to be seen here as well if appropriate.   Current Medications (verified): 1)  Prenatal Vitamins 0.8 Mg Tabs (Prenatal Multivit-Min-Fe-Fa) .Marland Kitchen.. 1 By Mouth Daily 2)  Ferrous Fumarate 325 Mg Tabs (Ferrous Fumarate) .Marland Kitchen.. 1 By Mouth Daily 3)  Colace 100 Mg Caps (Docusate Sodium) .Marland Kitchen.. 1 By Mouth 2 Times Daily As Needed For Constipation 4)  Monistat 1 Combo Pack 1200-2 Mg-% Kit (Miconazole Nitrate) .... Per Package Instructions 5)  Sertraline Hcl 100 Mg Tabs (Sertraline Hcl) .... Take 1/2 By Mouth  Daily X 1 Week, Then Increase To 1 Tab Daily.  Allergies (verified): No Known Drug Allergies  Past History:  Past Medical History: Last updated: 12/24/2008 H/O abnl paps (2/03, 7/02) with colpo/cryo (7/02) and normal PAP 2004, 2010 H/O recurrant BV H/O multiple STDs Gonorrhea in 4/08, Trichomonas 4/08 - Treated   Obesity  Allergic Rhinitis Depression Anemia  Family History: Last updated: 2008/07/17 Father- Deceased (gunshot wound) HTN - Mother Christine Cervantes)  Review of Systems General:  Denies chills, fever, and malaise. Psych:  Complains of depression, easily angered, easily tearful, and irritability; denies panic attacks, suicidal thoughts/plans, and thoughts /plans of harming others.  Physical Exam  General:  Overwieght AAF with no acute distress, alert and oriented x 3 and cooperative.  Lungs:  CTAB Heart:  RRR no m/r/g Msk:  Gravid Psych:  Flat affect, lethargic, Oriented X 3 and normally interactive.     Impression & Recommendations:  Problem # 1:  DEPRESSION, SITUATIONAL (ICD-309.0) Assessment New  Will restart Zoloft at low dose and slowly titrate. Guidelines for depression in pregnancy: If patient had previous episode of depression that improved with SSRI/SNRI, restarting medication is first line. Zoloft category C (she is late third trimester), safe for breastfeeding. Discussed with Dr. Raymondo Band, PharmD. Will monitor closely.  Orders: [facility]- Est Level  3 (04540)  Problem # 2:  PREGNANCY, NORMAL (ICD-V22.2) Assessment: Unchanged  [facility]. Will  review records and be available for delivery.  Orders: [facility]- Est Level  3 ([address]109)  Complete Medication List: 1)  Prenatal Vitamins 0.8 Mg Tabs (Prenatal multivit-min-fe-fa) .Marland Kitchen.. 1 by mouth daily 2)  Ferrous Fumarate 325 Mg Tabs (Ferrous fumarate) .Marland Kitchen.. 1 by mouth daily 3)  Colace 100 Mg Caps (Docusate sodium) .Marland Kitchen.. 1 by mouth 2 times daily as needed for constipation 4)  Monistat 1 Combo Pack 1200-2  Mg-% Kit (Miconazole nitrate) .... Per package instructions 5)  Sertraline Hcl 100 Mg Tabs (Sertraline hcl) .... Take 1/2 by mouth daily x 1 week, then increase to 1 tab daily.  Patient Instructions: 1)  It was great to see you today! 2)  I am restarting your previous anti-depression medication, Zoloft. I would like to see you again in 2 weeks to make sure that it is working. If you start to have thoughts of hurting yourself or others, please stop the medication and call. 3)  I would love to deliver your daughter. I will review your records and discuss the case with the [facility] doctors. Please give the nurses my card if you go into labor.  4)  Please let me know if you have any questions or problems.  Prescriptions: SERTRALINE HCL 100 MG TABS (SERTRALINE HCL) take 1/2 by mouth daily x 1 week, then increase to 1 tab daily.  #30 x 1   Entered and Authorized by:   Helane Rima MD   Signed by:   Helane Rima MD on 07/05/2009   Method used:   Electronically to        [facility] [address] [phone]* (retail)       [address]       [place], [place]  [address]       Ph: [phone] or [phone]       Fax: [phone]   RxID:   [id number]

## 2010-11-08 NOTE — Assessment & Plan Note (Signed)
Summary: fatigue, and itching around nipple on breast/ls   Vital Signs:  Patient Profile:   25 Years Old Female Height:     65 inches Weight:      193 pounds BMI:     32.23 Pulse rate:   76 / minute BP sitting:   119 / 81  Vitals Entered By: Lillia Pauls CMA (July 16, 2008 4:27 PM)                 PCP:  Helane Rima MD  Chief Complaint:  TIRED/ANXIOUS X 2-3 WKS; INSOMNIA; NIPPLE ITCH.  History of Present Illness: Christine Cervantes is a 25 year old female presenting with multiple complaints today.  1. Fatigue- She c/o feeling tired throughout the day for several months now, though worse lately. She admits that she has not slept well these last few weeks. She does not exercise.  2. Insomnia- She has trouble with falling asleep due to racing thoughts and worries. She also has trouble with early wakening. She admits to eating at night when she wakes and cannot go back to sleep. She does take Tylenol PM to help her to sleep, but complains that it also makes her sleepy during the next day.  3. Depression/ Anxiety- She admits to feeling depressed lately and crying frequently. This has worsened recently as she is concerned that she may have cancer (she has noticed that her left nipple is itchy and has read that this may be a sign of breast cancer) or MS (her family member was recently diagnosed with MS). She has never been diagnosed with depression before; has never taken medication for depression in the past). She denies suicidal or homicidal ideations.  4. Areola/Nipple Itch- This is intermittent, located at areola/nipple of left breast only, in the same area that she was found to have a benign nevus at her last checkup.    Updated Prior Medication List: FLONASE 50 MCG/ACT  SUSP (FLUTICASONE PROPIONATE) 2 sprays in each nostril daily ZOLOFT 50 MG TABS (SERTRALINE HCL) one by mouth daily TRAZODONE HCL 50 MG TABS (TRAZODONE HCL) take 1/2 to one tab by mouth each night as needed for  sleep  Current Allergies: No known allergies   Past Medical History:    Reviewed history from 03/04/2008 and no changes required:       H/O abnl paps (2/03, 7/02) with colpo/cryo (7/02) and normal PAP 2004       H/o recurrant BV       H/O multiple STDs       Gonorrhea in 4/08, Trichomonas 4/08 - treated         OBESITY        RHINITIS, ALLERGIC NOS       DISORDER, EPISODIC MOOD NOS          Family History:    Reviewed history from 03/04/2008 and no changes required:       Father- Deceased (gunshot wound)       HTN - Mother Joliana Claflin)  Social History:    Reviewed history from 03/04/2008 and no changes required:       Mother Jari Sportsman) who works in the [employer].   Denies smoking, ETOH, and drugs.    Review of Systems       The patient complains of depression.  The patient denies fever, weight loss, chest pain, syncope, dyspnea on exertion, peripheral edema, prolonged cough, headaches, abdominal pain, suspicious skin lesions, and breast masses.     Physical  Exam  General:     Morbidly obese AAF with no acute distress, alert and oriented x 3 and cooperative Neck:     No deformities, masses, or tenderness noted. Breasts:     Fibrocystic change bilaterally, no tenderness, bulging, retraction, inflamation, nipple discharge or skin changes noted.    a 1 cm diameter nonpalpable hyperpigmentation at 3 o'clock of her right breast.  Lungs:     Normal respiratory effort, chest expands symmetrically. Lungs are clear to auscultation, no crackles or wheezes. Heart:     Normal rate and regular rhythm. S1 and S2 normal without gallop, murmur, click, rub or other extra sounds. Abdomen:     Bowel sounds positive,abdomen soft and non-tender without masses, organomegaly or hernias noted. Psych:     Oriented X3, good eye contact, and flat affect.      Impression & Recommendations:  Problem # 1:  FATIGUE (ICD-780.79) Assessment: New TSH was normal at 1.464 on 5/09.  Discussed that multiple factors to consider when treating her fatigue including (1) treating her insomnia (2) addressing depression (3) ruling out other organic causes such as anemia (4) begin exercise and diet changes for weight loss (5) addressing her concerns regarding breast cancer and MS.  Orders: [facility] ([address]) [facility]- Est  Level 4 (60454)   Problem # 2:  ANEMIA (ICD-285.9) Assessment: New Hemoglobin slightly low at 11.6, MCV 81.8. Will discuss anemia with patient at next visit. Will advise ferrous sulfate supplementation with colace for constipation.  Problem # 3:  DEPRESSIVE DISORDER (ICD-311) Assessment: New Will begin low dose Zoloft and recheck in 2 weeks. Orders: [facility]- Est  Level 4 ([address])  Her updated medication list for this problem includes:    Zoloft 50 Mg Tabs (Sertraline hcl) ..... One by mouth daily    Trazodone Hcl 50 Mg Tabs (Trazodone hcl) .Marland Kitchen... Take 1/2 to one tab by mouth each night as needed for sleep   Problem # 4:  INSOMNIA-SLEEP DISORDER-UNSPEC (ICD-780.52) Assessment: New Will begin low dose Trazodone at night for insomnia and recheck in 2 weeks. Orders: [facility]- Est  Level 4 (91478)   Problem # 5:  OBESITY (ICD-278.00) Assessment: Deteriorated Encourage diet change and exercise for weight loss.  Problem # 6:  PRURITUS (ICD-698.9) Assessment: New No masses, nipple discharge, or skin changes were noted during the breast exam. Kristle has no family history of breast cancer. I discussed the low likelihood that she may have breast cancer based on her symptoms and advised her to use calamine lotion for relief of the itching.   Complete Medication List: 1)  Flonase 50 Mcg/act Susp (Fluticasone propionate) .... 2 sprays in each nostril daily 2)  Zoloft 50 Mg Tabs (Sertraline hcl) .... One by mouth daily 3)  Trazodone Hcl 50 Mg Tabs (Trazodone hcl) .... Take 1/2 to one tab by mouth each night as needed for sleep  Other Orders: Influenza Vaccine NON MCR  (29562)   Patient Instructions: 1)  Please schedule a follow-up appointment in 2 weeks.   Prescriptions: TRAZODONE HCL 50 MG TABS (TRAZODONE HCL) take 1/2 to one tab by mouth each night as needed for sleep  #30 x 1   Entered and Authorized by:   Helane Rima MD   Signed by:   Helane Rima MD on 07/16/2008   Method used:   Print then Give to Patient   RxID:   [id number] ZOLOFT 50 MG TABS (SERTRALINE HCL) one by mouth daily  #30 x 1   Entered and Authorized  by:   Helane Rima MD   Signed by:   Helane Rima MD on 07/16/2008   Method used:   Print then Give to Patient   RxID:   [id number]  ]  Influenza Vaccine    Vaccine Type: Fluvax Non-MCR    Site: left deltoid    Mfr: GlaxoSmithKline    Dose: 0.5 ml    Route: IM    Given by: Lillia Pauls CMA    Exp. Date: 04/07/2009    Lot #: [id number]    VIS given: 05/02/07 version given July 16, 2008.  Flu Vaccine Consent Questions    Do you have a history of severe allergic reactions to this vaccine? no    Any prior history of allergic reactions to egg and/or gelatin? no    Do you have a sensitivity to the preservative Thimersol? no    Do you have a past history of Guillan-Barre Syndrome? no    Do you currently have an acute febrile illness? no    Have you ever had a severe reaction to latex? no    Vaccine information given and explained to patient? yes    Are you currently pregnant? no

## 2010-11-08 NOTE — Miscellaneous (Signed)
Summary: MEDICAL FORM  Patient dropped off form to be filled out for her job.  Please call her when it is ready to be picked up. Christine Cervantes  July 06, 2008 10:39 AM  I do not have this paperwork. Please let me know if this has already been completed. Thanks! Christine Cervantes  Appended Document: MEDICAL FORM  completion request form states patient was notified to pick up form that MD filled out on 07/07/2008.

## 2010-11-08 NOTE — Progress Notes (Signed)
Summary: triage   Phone Note Call from Patient Call back at Home Phone [phone]   Caller: Patient Summary of Call: pt is about 3 mo pregnant having constant cramping. feeling real dizzy and light headed. Initial call taken by: Clydell Hakim,  January 18, 2009 2:55 PM  Follow-up for Phone Call        unable to reach at either number in chart. when she calls back will refer her to [facility] ed. she is to start care there in high risk clinic this Thursday Follow-up by: Golden Circle RN,  January 18, 2009 2:59 PM  Additional Follow-up for Phone Call Additional follow up Details #1::        worse today. on & off x 1 week. constant today. sent to [facility] ED. she agreed with this plan Additional Follow-up by: Golden Circle RN,  January 18, 2009 3:11 PM      Appended Document: triage Patient was seen in the MAU at the [facility] on 01/18/09. She was diagnosed with Dehydration and discharged home.

## 2010-11-10 ENCOUNTER — Encounter: Payer: Self-pay | Admitting: Family Medicine

## 2010-11-10 ENCOUNTER — Ambulatory Visit: Payer: Self-pay | Admitting: Family Medicine

## 2010-11-10 ENCOUNTER — Encounter: Payer: Self-pay | Admitting: *Deleted

## 2010-11-10 DIAGNOSIS — N62 Hypertrophy of breast: Secondary | ICD-10-CM | POA: Insufficient documentation

## 2010-11-10 DIAGNOSIS — E049 Nontoxic goiter, unspecified: Secondary | ICD-10-CM | POA: Insufficient documentation

## 2010-11-10 DIAGNOSIS — S161XXA Strain of muscle, fascia and tendon at neck level, initial encounter: Secondary | ICD-10-CM | POA: Insufficient documentation

## 2010-11-10 LAB — CONVERTED CEMR LAB: TSH: 2.007 microintl units/mL (ref 0.350–4.500)

## 2010-11-10 LAB — TSH: TSH: 2.007 u[IU]/mL (ref 0.350–4.500)

## 2010-11-11 ENCOUNTER — Encounter: Payer: Self-pay | Admitting: Family Medicine

## 2010-11-15 ENCOUNTER — Encounter: Payer: Self-pay | Admitting: *Deleted

## 2010-11-16 NOTE — Letter (Signed)
Summary: Work Excuse  [facility]  [address]   [place], [place] [address]   Phone: [phone]  Fax: [phone]    Today's Date: November 10, 2010  Name of Patient: Christine Cervantes  The above named patient had a medical visit today at: 8:45 am.  Please take this into consideration when reviewing the time away from work/school.    Special Instructions:  Arly.Keller ] None  [  ] To be off the remainder of today, returning to the normal work / school schedule tomorrow.  [  ] To be off until the next scheduled appointment on ______________________.  [  ] Other ________________________________________________________________ ________________________________________________________________________   Sincerely yours,   Loralee Pacas CMA

## 2010-11-16 NOTE — Letter (Addendum)
Summary: Generic Letter  [name]  [address]   [place], [place] [address]   Phone: [phone]  Fax: [phone]    11/11/2010 MRN: [id number]  [address] [place], [place]  [address]  [place]  Dear Ms. Philipps,   Your TSH level came back normal. This suggests your thyroid function is normal.    Sincerely,   [facility]  Appended Document: Generic Letter mailed

## 2010-11-16 NOTE — Assessment & Plan Note (Signed)
Summary: chest & back pain/bmc   Vital Signs:  Patient profile:   25 year old female Height:      65 inches Weight:      197.4 pounds BMI:     32.97 Temp:     98.1 degrees F oral Pulse rate:   76 / minute BP sitting:   142 / 93  (left arm) Cuff size:   regular  Vitals Entered By: Jimmy Footman, CMA (November 10, 2010 9:19 AM) CC: back & chest pain x1 month Is Patient Diabetic? No Pain Assessment Patient in pain? yes      Comments Was seen in ER a few weeks ago for this   Primary Provider:  Helane Rima DO  CC:  back & chest pain x1 month.  History of Present Illness: 1. Thoracic Back pain.  patient has a 1.5 month history of upper thoracic midline back pain associated with intermittant chest pressure. She has no other symptoms. She was worked up for MI and PE in the emergency room on the 17th. She had a chest xray which was normal and reviewed again today.  She does have large breasts and shoulder grooves suggestive of strain from the weight of her mastomegaly. On exam the pain is just below the neck in the midline with minimal pain on palpation. She has no palpable vertebral irregularities. She has no weight loss. no family history of bone cancer. She has no fevers, chills or night sweats. She does a lot of heavy lifting with her job.  2. Thyromegaly.  Will obtain a TSH level. it was bilaterally palpable without any nodules. She has no symptoms of hypo or hyperthyroidism.  Preventive Screening-Counseling & Management  Alcohol-Tobacco     Smoking Status: never  Allergies: No Known Drug Allergies  Review of Systems       SEE HPI  Physical Exam  Msk:  tenderness to palpation of upper thoracic vertebra.   Impression & Recommendations:  Problem # 1:  PAIN IN THORACIC SPINE (ICD-724.1) she will do back strengthening excercises. she will purchase a weight distributing bra to help alleviate pain from her large breast weight. she will consider breast reduction  surgery.  Her updated medication list for this problem includes:    Ibuprofen 800 Mg Tabs (Ibuprofen) .Marland Kitchen... Take one tab every 6 hours as needed for pain, take with food  Orders: [facility]- Est Level  3 (99213)  Problem # 2:  THYROMEGALY (ICD-240.9) Will follow TSH level today. close monitoring.  Orders: [facility]- Est Level  3 ([address]) TSH-[facility] ([address])  Complete Medication List: 1)  Ibuprofen 800 Mg Tabs (Ibuprofen) .... Take one tab every 6 hours as needed for pain, take with food  Patient Instructions: 1)  Please schedule a follow-up appointment in 1 month. 2)  Please try and find a supportive bra that helps with back pain. 3)  I recommend strength training excercises for your upper back to build up muscle and support your back. Prescriptions: IBUPROFEN 800 MG TABS (IBUPROFEN) take one tab every 6 hours as needed for pain, take with food  #30 x 1   Entered and Authorized by:   Edd Arbour   Signed by:   Edd Arbour on 11/10/2010   Method used:   Electronically to        [facility]* (retail)       [address]       [place]       [place], [place]  [id number]       Ph: [phone]       Fax: [phone]   RxID:   [id number]    Orders Added: 1)  [facility]- Est Level  3 [6[address]] 2)  [facility] [[id number]]

## 2010-12-05 ENCOUNTER — Telehealth: Payer: Self-pay | Admitting: Family Medicine

## 2010-12-05 NOTE — Telephone Encounter (Signed)
Wants to talk to nurse about her being so tired.

## 2010-12-05 NOTE — Telephone Encounter (Signed)
Discussed with Dennison Nancy RN. She can have the appt Wed pm with her pcp or keep the one she has on Monday. I called pt back & she is adamant that she cannnot get off work Wed. She threatened to go to ED.

## 2010-12-05 NOTE — Telephone Encounter (Signed)
C/o SOB easily. Hx back & chest pain due to her breasts. No URI per pt. C/o extreme fatigue. Sleeps from to 2pm. This has been going on since December. Hx of anemia. Takes one a day vitamins. Admits to fast food diet. Grandma watches the kids.

## 2010-12-12 ENCOUNTER — Ambulatory Visit: Payer: Self-pay | Admitting: Family Medicine

## 2010-12-16 ENCOUNTER — Encounter: Payer: Self-pay | Admitting: Family Medicine

## 2010-12-16 ENCOUNTER — Ambulatory Visit (INDEPENDENT_AMBULATORY_CARE_PROVIDER_SITE_OTHER): Payer: Self-pay | Admitting: Family Medicine

## 2010-12-16 VITALS — BP 130/82 | Temp 98.4°F | Ht 65.0 in | Wt 198.0 lb

## 2010-12-16 DIAGNOSIS — R5381 Other malaise: Secondary | ICD-10-CM

## 2010-12-16 DIAGNOSIS — R5383 Other fatigue: Secondary | ICD-10-CM

## 2010-12-16 LAB — CBC
HCT: 39.1 % (ref 36.0–46.0)
MCHC: 31.2 g/dL (ref 30.0–36.0)
Platelets: 237 10*3/uL (ref 150–400)
RDW: 13.4 % (ref 11.5–15.5)
WBC: 4.2 10*3/uL (ref 4.0–10.5)

## 2010-12-16 LAB — CONVERTED CEMR LAB
AST: 13 units/L (ref 0–37)
Alkaline Phosphatase: 56 units/L (ref 39–117)
BUN: 9 mg/dL (ref 6–23)
Glucose, Bld: 97 mg/dL (ref 70–99)
MCHC: 31.2 g/dL (ref 30.0–36.0)
MCV: 83 fL (ref 78.0–100.0)
RBC: 4.71 M/uL (ref 3.87–5.11)
RDW: 13.4 % (ref 11.5–15.5)
Sodium: 138 meq/L (ref 135–145)
Total Bilirubin: 0.4 mg/dL (ref 0.3–1.2)

## 2010-12-16 LAB — COMPREHENSIVE METABOLIC PANEL
ALT: 10 U/L (ref 0–35)
AST: 13 U/L (ref 0–37)
Albumin: 4.3 g/dL (ref 3.5–5.2)
Alkaline Phosphatase: 56 U/L (ref 39–117)
BUN: 9 mg/dL (ref 6–23)
Calcium: 8.7 mg/dL (ref 8.4–10.5)
Chloride: 105 mEq/L (ref 96–112)
Potassium: 4 mEq/L (ref 3.5–5.3)
Sodium: 138 mEq/L (ref 135–145)

## 2010-12-16 NOTE — Patient Instructions (Signed)
It was nice to see you today!  We are checking labs and a urine pregnancy test today. I will call when the results come back.

## 2010-12-19 ENCOUNTER — Encounter: Payer: Self-pay | Admitting: Family Medicine

## 2010-12-21 ENCOUNTER — Telehealth: Payer: Self-pay | Admitting: Family Medicine

## 2010-12-21 NOTE — Telephone Encounter (Signed)
Pt requesting test results.

## 2010-12-21 NOTE — Telephone Encounter (Signed)
Called patient, informed of normal lab results and neg pregnancy test. Patient requesting to come in for B 12 shot. Told her to schedule appointment with Dr Earlene Plater to discuss.

## 2010-12-21 NOTE — Telephone Encounter (Signed)
Sorry, I sent a letter. All results were normal. Negative pregnancy, normal hemoglobin, normal electrolytes, normal thyroid. Ask patient to make an appointment if she would like to discuss further work-up.

## 2010-12-21 NOTE — Telephone Encounter (Signed)
Forward to [place] for review

## 2010-12-25 ENCOUNTER — Encounter: Payer: Self-pay | Admitting: Family Medicine

## 2010-12-25 NOTE — Progress Notes (Signed)
  Subjective:     Christine Cervantes is a 25 y.o. female who presents for evaluation of fatigue. Symptoms began several weeks ago. The patient feels the fatigue began with: excessive menstrual bleeding. Symptoms of her fatigue have been general malaise and hypersomnolence. Patient describes the following psychological symptoms: NONE - Patient directly denies stress and depression.. Patient denies change in hair texture, cold intolerance, constipation, fever, GI blood loss, significant change in weight, symptoms of arthritis, unusual rashes and witnessed or suspected sleep apnea. Symptoms have been intermittent. Symptom severity: struggles to carry out day to day responsibilities.. Previous visits for this problem: none.   The following portions of the patient's history were reviewed and updated as appropriate: allergies, current medications, past family history, past medical history, past social history, past surgical history and problem list.  Review of Systems Pertinent items are noted in HPI.    Objective:    BP 130/82  Temp(Src) 98.4 F (36.9 C) (Oral)  Ht 5\' 5"  (1.651 m)  Wt 198 lb (89.812 kg)  BMI 32.95 kg/m2  LMP 12/02/2010 General appearance: alert, cooperative, no distress and moderately obese Eyes: conjunctivae/corneas clear. PERRL, EOM's intact. Fundi benign. Neck: no adenopathy and thyroid not enlarged, symmetric, no tenderness/mass/nodules Lungs: clear to auscultation bilaterally Heart: regular rate and rhythm, S1, S2 normal, no murmur, click, rub or gallop Abdomen: soft, non-tender; bowel sounds normal; no masses,  no organomegaly Extremities: extremities normal, atraumatic, no cyanosis or edema Pulses: 2+ and symmetric    Assessment:    Depression is likely contributing to the problem. Will check CBC and TSH for anemia and hypothyroid.    Plan:    Discussed lifestyle modification as means of resolving problem. See orders for lab evaluation. Discussed how depression can  be a cause of fatigue.

## 2011-01-05 ENCOUNTER — Inpatient Hospital Stay (HOSPITAL_COMMUNITY)
Admission: AD | Admit: 2011-01-05 | Discharge: 2011-01-05 | Disposition: A | Discharge status: Home / Self Care | Payer: BC Managed Care – PPO | Source: Ambulatory Visit | Attending: Obstetrics and Gynecology | Admitting: Obstetrics and Gynecology

## 2011-01-05 ENCOUNTER — Inpatient Hospital Stay (HOSPITAL_COMMUNITY): Payer: BC Managed Care – PPO

## 2011-01-05 DIAGNOSIS — R109 Unspecified abdominal pain: Secondary | ICD-10-CM

## 2011-01-05 DIAGNOSIS — O26899 Other specified pregnancy related conditions, unspecified trimester: Secondary | ICD-10-CM

## 2011-01-05 DIAGNOSIS — O210 Mild hyperemesis gravidarum: Secondary | ICD-10-CM

## 2011-01-05 DIAGNOSIS — O9989 Other specified diseases and conditions complicating pregnancy, childbirth and the puerperium: Secondary | ICD-10-CM

## 2011-01-05 DIAGNOSIS — O99891 Other specified diseases and conditions complicating pregnancy: Secondary | ICD-10-CM | POA: Insufficient documentation

## 2011-01-05 LAB — CBC
HCT: 36.2 % (ref 36.0–46.0)
MCH: 25.5 pg — ABNORMAL LOW (ref 26.0–34.0)
MCV: 81.7 fL (ref 78.0–100.0)
Platelets: 200 10*3/uL (ref 150–400)
RDW: 13.5 % (ref 11.5–15.5)
WBC: 6.1 10*3/uL (ref 4.0–10.5)

## 2011-01-05 LAB — URINALYSIS, ROUTINE W REFLEX MICROSCOPIC
Glucose, UA: NEGATIVE mg/dL
Leukocytes, UA: NEGATIVE
pH: 6 (ref 5.0–8.0)

## 2011-01-05 LAB — URINE MICROSCOPIC-ADD ON

## 2011-01-05 LAB — WET PREP, GENITAL
Trich, Wet Prep: NONE SEEN
Yeast Wet Prep HPF POC: NONE SEEN

## 2011-01-06 LAB — GC/CHLAMYDIA PROBE AMP, GENITAL
Chlamydia, DNA Probe: NEGATIVE
GC Probe Amp, Genital: NEGATIVE

## 2011-01-07 ENCOUNTER — Inpatient Hospital Stay (HOSPITAL_COMMUNITY)
Admission: AD | Admit: 2011-01-07 | Discharge: 2011-01-07 | Disposition: A | Discharge status: Home / Self Care | Payer: BC Managed Care – PPO | Source: Ambulatory Visit | Attending: Family Medicine | Admitting: Family Medicine

## 2011-01-07 DIAGNOSIS — R109 Unspecified abdominal pain: Secondary | ICD-10-CM | POA: Insufficient documentation

## 2011-01-07 DIAGNOSIS — O99891 Other specified diseases and conditions complicating pregnancy: Secondary | ICD-10-CM | POA: Insufficient documentation

## 2011-01-10 ENCOUNTER — Inpatient Hospital Stay (HOSPITAL_COMMUNITY)
Admission: AD | Admit: 2011-01-10 | Discharge: 2011-01-10 | Disposition: A | Discharge status: Home / Self Care | Payer: BC Managed Care – PPO | Source: Ambulatory Visit | Attending: Obstetrics and Gynecology | Admitting: Obstetrics and Gynecology

## 2011-01-10 DIAGNOSIS — O99891 Other specified diseases and conditions complicating pregnancy: Secondary | ICD-10-CM | POA: Insufficient documentation

## 2011-01-10 DIAGNOSIS — N949 Unspecified condition associated with female genital organs and menstrual cycle: Secondary | ICD-10-CM | POA: Insufficient documentation

## 2011-01-10 DIAGNOSIS — O9989 Other specified diseases and conditions complicating pregnancy, childbirth and the puerperium: Secondary | ICD-10-CM

## 2011-01-10 LAB — HCG, QUANTITATIVE, PREGNANCY: hCG, Beta Chain, Quant, S: 1615 m[IU]/mL — ABNORMAL HIGH (ref <5)

## 2011-01-12 ENCOUNTER — Inpatient Hospital Stay (HOSPITAL_COMMUNITY): Payer: BC Managed Care – PPO

## 2011-01-12 ENCOUNTER — Inpatient Hospital Stay (HOSPITAL_COMMUNITY)
Admission: AD | Admit: 2011-01-12 | Discharge: 2011-01-12 | Disposition: A | Discharge status: Home / Self Care | Payer: BC Managed Care – PPO | Source: Ambulatory Visit | Attending: Obstetrics & Gynecology | Admitting: Obstetrics & Gynecology

## 2011-01-12 DIAGNOSIS — O039 Complete or unspecified spontaneous abortion without complication: Secondary | ICD-10-CM

## 2011-01-12 DIAGNOSIS — O200 Threatened abortion: Secondary | ICD-10-CM

## 2011-01-12 LAB — CBC
Hemoglobin: 10.2 g/dL — ABNORMAL LOW (ref 12.0–15.0)
MCHC: 31.8 g/dL (ref 30.0–36.0)
Platelets: 196 10*3/uL (ref 150–400)
RBC: 3.48 MIL/uL — ABNORMAL LOW (ref 3.87–5.11)
RDW: 15 % (ref 11.5–15.5)
WBC: 8.6 10*3/uL (ref 4.0–10.5)

## 2011-01-12 LAB — URINALYSIS, ROUTINE W REFLEX MICROSCOPIC
Bilirubin Urine: NEGATIVE
Ketones, ur: NEGATIVE mg/dL
Nitrite: NEGATIVE
Specific Gravity, Urine: <1.005 — ABNORMAL LOW (ref 1.005–1.030)
Urobilinogen, UA: 0.2 mg/dL (ref 0.0–1.0)
pH: 7 (ref 5.0–8.0)

## 2011-01-12 LAB — HCG, QUANTITATIVE, PREGNANCY: hCG, Beta Chain, Quant, S: 531 m[IU]/mL — ABNORMAL HIGH (ref <5)

## 2011-01-12 LAB — RPR: RPR Ser Ql: NONREACTIVE

## 2011-01-13 LAB — POCT URINALYSIS DIP (DEVICE)
Bilirubin Urine: NEGATIVE
Bilirubin Urine: NEGATIVE
Glucose, UA: NEGATIVE mg/dL
Glucose, UA: NEGATIVE mg/dL
Glucose, UA: NEGATIVE mg/dL
Hgb urine dipstick: NEGATIVE
Hgb urine dipstick: NEGATIVE
Ketones, ur: NEGATIVE mg/dL
Ketones, ur: NEGATIVE mg/dL
Ketones, ur: NEGATIVE mg/dL
Ketones, ur: NEGATIVE mg/dL
Nitrite: NEGATIVE
Specific Gravity, Urine: 1.015 (ref 1.005–1.030)
Specific Gravity, Urine: 1.025 (ref 1.005–1.030)
Specific Gravity, Urine: 1.02 (ref 1.005–1.030)
Urobilinogen, UA: 0.2 mg/dL (ref 0.0–1.0)
pH: 7 (ref 5.0–8.0)

## 2011-01-13 LAB — URINALYSIS, ROUTINE W REFLEX MICROSCOPIC
Glucose, UA: NEGATIVE mg/dL
Glucose, UA: NEGATIVE mg/dL
Hgb urine dipstick: NEGATIVE
Protein, ur: NEGATIVE mg/dL
Specific Gravity, Urine: 1.025 (ref 1.005–1.030)
pH: 6.5 (ref 5.0–8.0)
pH: 6.5 (ref 5.0–8.0)

## 2011-01-13 LAB — URINE MICROSCOPIC-ADD ON

## 2011-01-13 LAB — WET PREP, GENITAL

## 2011-01-14 LAB — POCT URINALYSIS DIP (DEVICE)
Bilirubin Urine: NEGATIVE
Bilirubin Urine: NEGATIVE
Bilirubin Urine: NEGATIVE
Glucose, UA: NEGATIVE mg/dL
Glucose, UA: NEGATIVE mg/dL
Hgb urine dipstick: NEGATIVE
Ketones, ur: 15 mg/dL — AB
Ketones, ur: NEGATIVE mg/dL
Nitrite: NEGATIVE
Nitrite: NEGATIVE
Nitrite: NEGATIVE
Protein, ur: 30 mg/dL — AB
Specific Gravity, Urine: 1.01 (ref 1.005–1.030)
Specific Gravity, Urine: 1.025 (ref 1.005–1.030)
Urobilinogen, UA: 1 mg/dL (ref 0.0–1.0)
pH: 7 (ref 5.0–8.0)
pH: 6 (ref 5.0–8.0)

## 2011-01-15 LAB — URINALYSIS, ROUTINE W REFLEX MICROSCOPIC
Bilirubin Urine: NEGATIVE
Hgb urine dipstick: NEGATIVE
Ketones, ur: NEGATIVE mg/dL
Nitrite: NEGATIVE
Protein, ur: NEGATIVE mg/dL
Specific Gravity, Urine: 1.03 (ref 1.005–1.030)
Urobilinogen, UA: 2 mg/dL — ABNORMAL HIGH (ref 0.0–1.0)
Urobilinogen, UA: 0.2 mg/dL (ref 0.0–1.0)
pH: 6 (ref 5.0–8.0)

## 2011-01-15 LAB — POCT URINALYSIS DIP (DEVICE)
Bilirubin Urine: NEGATIVE
Ketones, ur: NEGATIVE mg/dL
Nitrite: NEGATIVE
Protein, ur: NEGATIVE mg/dL

## 2011-01-15 LAB — FETAL FIBRONECTIN: Fetal Fibronectin: NEGATIVE

## 2011-01-15 LAB — GC/CHLAMYDIA PROBE AMP, GENITAL: GC Probe Amp, Genital: NEGATIVE

## 2011-01-16 LAB — URINALYSIS, ROUTINE W REFLEX MICROSCOPIC
Glucose, UA: NEGATIVE mg/dL
Nitrite: NEGATIVE
Protein, ur: NEGATIVE mg/dL

## 2011-01-16 LAB — POCT URINALYSIS DIP (DEVICE)
Glucose, UA: NEGATIVE mg/dL
Glucose, UA: NEGATIVE mg/dL
Nitrite: NEGATIVE
Protein, ur: 30 mg/dL — AB
Specific Gravity, Urine: 1.025 (ref 1.005–1.030)
Specific Gravity, Urine: 1.025 (ref 1.005–1.030)
Urobilinogen, UA: 2 mg/dL — ABNORMAL HIGH (ref 0.0–1.0)
Urobilinogen, UA: 1 mg/dL (ref 0.0–1.0)

## 2011-01-17 LAB — POCT URINALYSIS DIP (DEVICE)
Glucose, UA: NEGATIVE mg/dL
Hgb urine dipstick: NEGATIVE
Nitrite: NEGATIVE
Protein, ur: 30 mg/dL — AB
Specific Gravity, Urine: 1.015 (ref 1.005–1.030)
Urobilinogen, UA: 0.2 mg/dL (ref 0.0–1.0)
pH: 7 (ref 5.0–8.0)

## 2011-01-17 LAB — WET PREP, GENITAL
Trich, Wet Prep: NONE SEEN
Yeast Wet Prep HPF POC: NONE SEEN

## 2011-01-18 LAB — POCT URINALYSIS DIP (DEVICE): Specific Gravity, Urine: 1.025 (ref 1.005–1.030)

## 2011-01-18 LAB — URINALYSIS, ROUTINE W REFLEX MICROSCOPIC
Bilirubin Urine: NEGATIVE
Glucose, UA: NEGATIVE mg/dL
Hgb urine dipstick: NEGATIVE
Ketones, ur: 15 mg/dL — AB
Protein, ur: NEGATIVE mg/dL
Urobilinogen, UA: 0.2 mg/dL (ref 0.0–1.0)

## 2011-01-18 LAB — COMPREHENSIVE METABOLIC PANEL
ALT: 15 U/L (ref 0–35)
AST: 14 U/L (ref 0–37)
Alkaline Phosphatase: 34 U/L — ABNORMAL LOW (ref 39–117)
CO2: 25 mEq/L (ref 19–32)
Calcium: 9.1 mg/dL (ref 8.4–10.5)
GFR calc Af Amer: >60 mL/min (ref >60)
Glucose, Bld: 75 mg/dL (ref 70–99)
Potassium: 3.6 mEq/L (ref 3.5–5.1)
Sodium: 135 mEq/L (ref 135–145)
Total Protein: 6.3 g/dL (ref 6.0–8.3)

## 2011-01-18 LAB — CBC
Hemoglobin: 11 g/dL — ABNORMAL LOW (ref 12.0–15.0)
MCHC: 33.8 g/dL (ref 30.0–36.0)
RBC: 3.89 MIL/uL (ref 3.87–5.11)
RDW: 13.6 % (ref 11.5–15.5)

## 2011-01-18 LAB — WET PREP, GENITAL
Clue Cells Wet Prep HPF POC: NONE SEEN
Trich, Wet Prep: NONE SEEN

## 2011-01-18 LAB — URINE CULTURE: Colony Count: NO GROWTH

## 2011-01-18 LAB — GC/CHLAMYDIA PROBE AMP, GENITAL: GC Probe Amp, Genital: NEGATIVE

## 2011-01-19 LAB — GLUCOSE, CAPILLARY: Glucose-Capillary: 107 mg/dL — ABNORMAL HIGH (ref 70–99)

## 2011-01-24 LAB — WET PREP, GENITAL
Trich, Wet Prep: NONE SEEN
Yeast Wet Prep HPF POC: NONE SEEN

## 2011-01-24 LAB — CBC
Hemoglobin: 10.6 g/dL — ABNORMAL LOW (ref 12.0–15.0)
Platelets: 202 10*3/uL (ref 150–400)
RBC: 4 MIL/uL (ref 3.87–5.11)
RDW: 14 % (ref 11.5–15.5)

## 2011-01-24 LAB — URINALYSIS, ROUTINE W REFLEX MICROSCOPIC
Bilirubin Urine: NEGATIVE
Glucose, UA: NEGATIVE mg/dL
Hgb urine dipstick: NEGATIVE
Ketones, ur: NEGATIVE mg/dL
pH: 7 (ref 5.0–8.0)

## 2011-01-24 LAB — GC/CHLAMYDIA PROBE AMP, GENITAL
Chlamydia, DNA Probe: NEGATIVE
GC Probe Amp, Genital: NEGATIVE

## 2011-01-24 LAB — HCG, QUANTITATIVE, PREGNANCY
hCG, Beta Chain, Quant, S: 1767 m[IU]/mL — ABNORMAL HIGH (ref <5)
hCG, Beta Chain, Quant, S: 446 m[IU]/mL — ABNORMAL HIGH (ref <5)

## 2011-01-26 ENCOUNTER — Inpatient Hospital Stay (HOSPITAL_COMMUNITY)
Admission: AD | Admit: 2011-01-26 | Discharge: 2011-01-27 | Disposition: A | Discharge status: Home / Self Care | Payer: BC Managed Care – PPO | Source: Ambulatory Visit | Attending: Obstetrics & Gynecology | Admitting: Obstetrics & Gynecology

## 2011-01-26 DIAGNOSIS — R112 Nausea with vomiting, unspecified: Secondary | ICD-10-CM | POA: Insufficient documentation

## 2011-01-26 LAB — CBC
HCT: 39.6 % (ref 36.0–46.0)
Hemoglobin: 12.2 g/dL (ref 12.0–15.0)
MCH: 25.7 pg — ABNORMAL LOW (ref 26.0–34.0)
MCHC: 30.8 g/dL (ref 30.0–36.0)

## 2011-01-27 LAB — COMPREHENSIVE METABOLIC PANEL
CO2: 25 mEq/L (ref 19–32)
Calcium: 8.9 mg/dL (ref 8.4–10.5)
Creatinine, Ser: 0.73 mg/dL (ref 0.4–1.2)
GFR calc Af Amer: >60 mL/min (ref >60)
GFR calc non Af Amer: >60 mL/min (ref >60)
Glucose, Bld: 85 mg/dL (ref 70–99)

## 2011-01-27 LAB — URINALYSIS, ROUTINE W REFLEX MICROSCOPIC
Nitrite: NEGATIVE
Specific Gravity, Urine: 1.01 (ref 1.005–1.030)
pH: 8 (ref 5.0–8.0)

## 2011-04-19 ENCOUNTER — Encounter (HOSPITAL_COMMUNITY): Payer: Self-pay | Admitting: *Deleted

## 2011-04-19 ENCOUNTER — Inpatient Hospital Stay (HOSPITAL_COMMUNITY)
Admission: AD | Admit: 2011-04-19 | Discharge: 2011-04-19 | Disposition: A | Discharge status: Home / Self Care | Payer: BC Managed Care – PPO | Source: Ambulatory Visit | Attending: Obstetrics & Gynecology | Admitting: Obstetrics & Gynecology

## 2011-04-19 DIAGNOSIS — B373 Candidiasis of vulva and vagina: Secondary | ICD-10-CM

## 2011-04-19 DIAGNOSIS — R109 Unspecified abdominal pain: Secondary | ICD-10-CM | POA: Insufficient documentation

## 2011-04-19 DIAGNOSIS — N760 Acute vaginitis: Secondary | ICD-10-CM

## 2011-04-19 LAB — URINALYSIS, ROUTINE W REFLEX MICROSCOPIC
Bilirubin Urine: NEGATIVE
Hgb urine dipstick: NEGATIVE
Nitrite: NEGATIVE
Specific Gravity, Urine: 1.025 (ref 1.005–1.030)
pH: 6 (ref 5.0–8.0)

## 2011-04-19 LAB — WET PREP, GENITAL
Trich, Wet Prep: NONE SEEN
Yeast Wet Prep HPF POC: NONE SEEN

## 2011-04-19 MED ORDER — FLUCONAZOLE 150 MG PO TABS: 150.0000 mg | ORAL_TABLET | Freq: Once | ORAL | Status: AC

## 2011-04-19 MED ORDER — IBUPROFEN 800 MG PO TABS: 800.0000 mg | ORAL_TABLET | Freq: Three times a day (TID) | ORAL | Status: DC | PRN

## 2011-04-19 MED ORDER — IBUPROFEN 800 MG PO TABS
800.0000 mg | ORAL_TABLET | Freq: Once | ORAL | Status: AC
  Administered 2011-04-19: 800 mg via ORAL
  Filled 2011-04-19: qty 1

## 2011-04-19 MED ORDER — FLUCONAZOLE 150 MG PO TABS: 150.0000 mg | ORAL_TABLET | Freq: Once | ORAL | Status: DC

## 2011-04-19 NOTE — Treatment Plan (Signed)
Georges Mouse CNM in to see pt

## 2011-04-19 NOTE — ED Notes (Signed)
Georges Mouse CNM in. Spec exam done and wet prep and GC/Chlam obtained. Pt tol well

## 2011-04-19 NOTE — Progress Notes (Signed)
Pt is G3P2 with c/o abd cramping for 2-3 days. Some cl vag d/c with "musty" odor. Breasts tender. LMP 04/09/11. Some nausea but denies emesis and no diarrhea.

## 2011-04-19 NOTE — ED Provider Notes (Signed)
History     Chief Complaint  Patient presents with  . Abdominal Pain   HPI 25 yo G3P2012 presents with c/o low abd cramping x 2 days, vaginal irritation and musty odor x 2 days. Also c/o some breast tenderness and nausea. Reports that LMP on 7/1 was lighter than normal and a few days early.   OB History    Grav Para Term Preterm Abortions TAB SAB Ect Mult Living   3 2  2 1  1   2       No past medical history on file.  No past surgical history on file.  No family history on file.  History  Substance Use Topics  . Smoking status: Never Smoker   . Smokeless tobacco: Never Used  . Alcohol Use: Not on file    Allergies: No Known Allergies  Prescriptions prior to admission  Medication Sig Dispense Refill  . ibuprofen (ADVIL,MOTRIN) 800 MG tablet Take 800 mg by mouth every 6 (six) hours as needed. Take with food         Review of Systems  Constitutional: Negative for fever and chills.  Respiratory: Negative.   Cardiovascular: Negative.   Gastrointestinal: Positive for nausea and abdominal pain. Negative for vomiting, diarrhea and constipation.  Genitourinary: Negative for dysuria, urgency, frequency and hematuria.       + vag d/c, odor  Neurological: Negative.   Psychiatric/Behavioral: Negative.    Physical Exam   Blood pressure 120/83, pulse 80, temperature 98.5 F (36.9 C), temperature source Oral, resp. rate 18, last menstrual period 04/09/2011.  Physical Exam  Constitutional: She appears well-developed and well-nourished. No distress.  Cardiovascular: Normal rate.   Respiratory: Effort normal.  GI: Soft. She exhibits no distension. There is no tenderness.  Genitourinary: Uterus normal. There is no rash or lesion on the right labia. There is no rash or lesion on the left labia. Uterus is not tender. Cervix exhibits no motion tenderness. Right adnexum displays no mass, no tenderness and no fullness. Left adnexum displays no mass, no tenderness and no fullness. No  bleeding around the vagina. Vaginal discharge (curdlike) found.   Wet prep: neg clue, trich, yeast, mod WBC UA neg UPT neg  MAU Course  Procedures  A/P: 25 yo G3P2 with vaginitis, will treat for yeast with diflucan d/t appearance of discharge, F/U with PCP as needed

## 2011-04-19 NOTE — Progress Notes (Signed)
PT SAYS  SHE HAS BEEN CRAMPING X2 DAYS - TOOK NO MED. DID HOME PREG TEST- NEG BUT FEELS S/S OF PREG- BREAST TENDER , NAUSEA, VAG IRRIT.  DENIES MRSA.

## 2011-04-20 LAB — GC/CHLAMYDIA PROBE AMP, GENITAL: Chlamydia, DNA Probe: NEGATIVE

## 2011-05-09 ENCOUNTER — Other Ambulatory Visit (HOSPITAL_COMMUNITY)
Admission: RE | Admit: 2011-05-09 | Discharge: 2011-05-09 | Disposition: A | Discharge status: Home / Self Care | Payer: BC Managed Care – PPO | Source: Ambulatory Visit | Attending: Family Medicine | Admitting: Family Medicine

## 2011-05-09 ENCOUNTER — Encounter: Payer: Self-pay | Admitting: Family Medicine

## 2011-05-09 ENCOUNTER — Ambulatory Visit (INDEPENDENT_AMBULATORY_CARE_PROVIDER_SITE_OTHER): Payer: BC Managed Care – PPO | Admitting: Family Medicine

## 2011-05-09 DIAGNOSIS — Z0000 Encounter for general adult medical examination without abnormal findings: Secondary | ICD-10-CM

## 2011-05-09 DIAGNOSIS — N62 Hypertrophy of breast: Secondary | ICD-10-CM

## 2011-05-09 DIAGNOSIS — Z01419 Encounter for gynecological examination (general) (routine) without abnormal findings: Secondary | ICD-10-CM | POA: Insufficient documentation

## 2011-05-09 DIAGNOSIS — N760 Acute vaginitis: Secondary | ICD-10-CM

## 2011-05-09 DIAGNOSIS — R5383 Other fatigue: Secondary | ICD-10-CM

## 2011-05-09 DIAGNOSIS — Z309 Encounter for contraceptive management, unspecified: Secondary | ICD-10-CM

## 2011-05-09 DIAGNOSIS — R5381 Other malaise: Secondary | ICD-10-CM

## 2011-05-09 DIAGNOSIS — Z124 Encounter for screening for malignant neoplasm of cervix: Secondary | ICD-10-CM

## 2011-05-09 LAB — POCT WET PREP (WET MOUNT)

## 2011-05-09 LAB — POCT URINE PREGNANCY: Preg Test, Ur: NEGATIVE

## 2011-05-09 MED ORDER — MEDROXYPROGESTERONE ACETATE 150 MG/ML IM SUSP
150.0000 mg | Freq: Once | INTRAMUSCULAR | Status: AC
  Administered 2011-05-09: 150 mg via INTRAMUSCULAR

## 2011-05-09 NOTE — Patient Instructions (Addendum)
It was very nice to meet you today! I will call you with the results from your PAP smear and from your blood work. These results should be back in a couple of days.  As far as the fatigue goes, we'll check your B12 and see if they are low. If not, I can see you back in one month and we can decide to start you on something that might help with depression. About your question about breast reduction, we can re-discuss the option of a referral to a plastic surgeon in a follow up visit. In the meantime, continue the great work with exercise and a balanced diet.

## 2011-05-09 NOTE — Assessment & Plan Note (Addendum)
PAP smear, wet prep, GC/Chlamydia done. HIV antibody drawn per patient request. Will call pt with results.  Contraception: pt started on Depo Provera today.

## 2011-05-09 NOTE — Assessment & Plan Note (Signed)
Pt interested in possibility of breast reduction. Advised pt that she would need to loose some weight prior to this, but that we could refer her to a plastic surgeon if she still wanted to pursue possibility.

## 2011-05-09 NOTE — Progress Notes (Signed)
  Subjective:    Patient ID: Christine Cervantes, female    DOB: [birth year], 25 y.o.   MRN: [id number]  HPI 25 yo female presents for yearly physical. She is concerned about increasing fatigue that she has been experiencing since December of last year. She reports a feeling of low energy. She sleeps 8hours per night and denies waking up at night. She denies falling asleep at the wheel or while sitting still during the day. She reports occasional light headness. Pt denies any skin changes, hair loss or constipation. She had a TSH in February which was normal as well as a CBC in April which was normal as well. She denies any GI bleed. Her period lasts three days and is less heavy than it has been in the past. She has a h/o depression and feels that her lack of energy may be bringing on feelings of depression. She reports lack of interest due to low energy. She denies any suicidal ideations.  Pt also concerned that weight from breast could be causing some extra fatigue. Inquired about possibility of breast reduction.   GYN history: last pap smear in 2010: wnl. H/o abnormal pap. H/o chlamydia and trichomonas, both treated.  First day of last menstrual period: 05/05/2011. Period lasts 3 days, moderate flow and moderate cramping. Menarche: 25yo. Denies any burning or itching. Reports having chronic discharge for which she takes boric acid inserts.  Review of Systems Negative except per HPI.     Objective:   Physical Exam Physical Examination: General appearance - alert, well appearing, and in no distress and overweight Chest - clear to auscultation, no wheezes, rales or rhonchi, symmetric air entry Heart - normal rate, regular rhythm, normal S1, S2, no murmurs, rubs, clicks or gallops Abdomen - soft, nontender, nondistended, no masses or organomegaly Breasts - large, symmetrical, no skin changes, no masses b/l.  Pelvic - VULVA: normal appearing vulva with no masses, tenderness or lesions. Silver clitoral  ring noted. VAGINA: normal appearing vagina with normal color and discharge, no lesions, CERVIX: normal appearing cervix without discharge or lesions, UTERUS: uterus is normal size, shape, consistency and nontender, ADNEXA: normal adnexa in size, nontender and no masses Skin - normal coloration and turgor      Assessment & Plan:

## 2011-05-09 NOTE — Assessment & Plan Note (Signed)
Fatigue does not appear to be due to iron deficiency anemia or to hypothyroid, given normal lab results. Sleep deprivation also seems less likely given her report of sleeping 8 uninterrupted hours. B12 deficiency could be a factor-will check B12 levels and give B12 shots if level is low. Pt eager to find medicine that could help her.  Depression could also be playing a role. Will follow up in one month to reevaluate and see if antidepressant may be needed.

## 2011-05-10 LAB — GC/CHLAMYDIA PROBE AMP, GENITAL
Chlamydia, DNA Probe: NEGATIVE
GC Probe Amp, Genital: NEGATIVE

## 2011-05-11 ENCOUNTER — Telehealth: Payer: Self-pay | Admitting: Family Medicine

## 2011-05-11 MED ORDER — METRONIDAZOLE 500 MG PO TABS: 500.0000 mg | ORAL_TABLET | Freq: Two times a day (BID) | ORAL | Status: DC

## 2011-05-11 MED ORDER — METRONIDAZOLE 0.75 % VA GEL: 1.0000 | Freq: Every day | VAGINAL | Status: AC

## 2011-05-11 NOTE — Telephone Encounter (Signed)
I received note from Dennison Nancy letting me know that Ms. Longsworth had called about lab results and also mentioned that she had been having a non productive cough without fevers. I called her back to let her know that this was probably a viral infection that she could take OTC Robitussin for. She told me that she had tried over the counter drugs that did not work and that she thought it was a viral infection and would be going to an urgent care this weekend when she wasn't working. I explained that a viral infection is treated symptomatically and might last up to 10days and that antibiotics are not needed.   She expressed frustration at still feeling tired and not knowing the reason why. I told her I would call her about her B12 results as soon as I received them.  I also let her know that her wet prep showed BV. Pt told me that flagyl made her sick when she took it by mouth. I called the preceptor room and talked with Dr. Cathey Endow who switched the flagyl po to metrogel applicator. Called pt back about this and left message on voicemail to fill the topical flagyl instead of the po form.

## 2011-05-11 NOTE — Telephone Encounter (Signed)
Pt is asking for results of test. Also had told doctor about her not feeling good and having a cough.  She now wants to know if she can get something for her cough.  She doesn't want to come in b/c it would be another $35 co pay.  pls advise

## 2011-05-11 NOTE — Telephone Encounter (Signed)
I filled your pt's Flagyl for her.  OK to use OTC cold meds prn.

## 2011-05-11 NOTE — Telephone Encounter (Signed)
Patient in for an OV on 7/1 and had wet prep, GC/Chlamydia, HIV, PAP and B12 level drawn.   Wanting to know the results of everything.  Told her that the wet prep was positive for BV, HIV was negative and GC/Chlamydia was negative.  The final results for the PAP and B12 were not in yet.  Told her that Dr. Gwenlyn Saran would be calling her to discuss the wet prep and would also share the final results of the B12 and PAP.  Patient also states that during the visit she told her that she was not feeling well and had a slight cough.  Patient now states that the cough has gotten worse however she is afebrile and the cough is non-productive.  She thinks it may be due to sinus drainage.  Wanting to know if Dr. Gwenlyn Saran would be willing to either call something in or recommend an OTC for the cough.   Explained to patient that I would be sending this note to Dr. Gwenlyn Saran and they could discuss this during the conversation.  Patient appreciative.

## 2011-05-15 ENCOUNTER — Telehealth: Payer: Self-pay | Admitting: Family Medicine

## 2011-05-15 DIAGNOSIS — F32A Depression, unspecified: Secondary | ICD-10-CM

## 2011-05-15 DIAGNOSIS — F329 Major depressive disorder, single episode, unspecified: Secondary | ICD-10-CM

## 2011-05-15 MED ORDER — CITALOPRAM HYDROBROMIDE 20 MG PO TABS: 20.0000 mg | ORAL_TABLET | Freq: Every day | ORAL | Status: DC

## 2011-05-15 NOTE — Telephone Encounter (Signed)
Called pt to let her know that her B12 and PAP results were normal. Offered to start her on citalopram, since she does have a history of depression. She agreed to that and to follow up in clinic in 2-3 weeks. Called in citalopram 20mg  to pharmacy.

## 2011-05-15 NOTE — Telephone Encounter (Signed)
Christine Cervantes is calling to get the rest of her test results.

## 2011-05-15 NOTE — Telephone Encounter (Signed)
Will forward to Dr Losq 

## 2011-05-25 ENCOUNTER — Ambulatory Visit: Payer: BC Managed Care – PPO | Admitting: Family Medicine

## 2011-06-29 LAB — POCT PREGNANCY, URINE
Operator id: 234501
Preg Test, Ur: NEGATIVE

## 2011-06-29 LAB — CBC
Hemoglobin: 12.9
Platelets: 180
RDW: 13.1

## 2011-06-29 LAB — I-STAT 8, (EC8 V) (CONVERTED LAB)
Glucose, Bld: 115 — ABNORMAL HIGH
Potassium: 3.9
TCO2: 25
pCO2, Ven: 45.4
pH, Ven: 7.331 — ABNORMAL HIGH

## 2011-06-29 LAB — DIFFERENTIAL
Basophils Absolute: 0
Lymphocytes Relative: 9 — ABNORMAL LOW
Monocytes Absolute: 0.8
Neutro Abs: 5.2
Neutrophils Relative %: 78 — ABNORMAL HIGH

## 2011-06-29 LAB — URINALYSIS, ROUTINE W REFLEX MICROSCOPIC
Leukocytes, UA: NEGATIVE
Nitrite: NEGATIVE
Specific Gravity, Urine: 1.028
pH: 6

## 2011-06-29 LAB — URINE MICROSCOPIC-ADD ON

## 2011-07-03 LAB — URINALYSIS, ROUTINE W REFLEX MICROSCOPIC
Glucose, UA: NEGATIVE
Protein, ur: NEGATIVE
Specific Gravity, Urine: >1.03 — ABNORMAL HIGH
Urobilinogen, UA: 0.2

## 2011-07-03 LAB — POCT PREGNANCY, URINE: Operator id: 25114

## 2011-07-14 LAB — POCT URINALYSIS DIP (DEVICE)
Glucose, UA: NEGATIVE mg/dL
Hgb urine dipstick: NEGATIVE
Nitrite: POSITIVE — AB
Urobilinogen, UA: 1 mg/dL (ref 0.0–1.0)
pH: 6 (ref 5.0–8.0)

## 2011-07-14 LAB — WET PREP, GENITAL

## 2011-08-27 ENCOUNTER — Encounter (HOSPITAL_COMMUNITY): Payer: Self-pay | Admitting: *Deleted

## 2011-08-27 ENCOUNTER — Inpatient Hospital Stay (HOSPITAL_COMMUNITY)
Admission: AD | Admit: 2011-08-27 | Discharge: 2011-08-27 | Disposition: A | Discharge status: Home / Self Care | Payer: BC Managed Care – PPO | Source: Ambulatory Visit | Attending: Obstetrics & Gynecology | Admitting: Obstetrics & Gynecology

## 2011-08-27 DIAGNOSIS — K2990 Gastroduodenitis, unspecified, without bleeding: Secondary | ICD-10-CM

## 2011-08-27 DIAGNOSIS — K2970 Gastritis, unspecified, without bleeding: Secondary | ICD-10-CM

## 2011-08-27 DIAGNOSIS — A5901 Trichomonal vulvovaginitis: Secondary | ICD-10-CM | POA: Insufficient documentation

## 2011-08-27 DIAGNOSIS — R109 Unspecified abdominal pain: Secondary | ICD-10-CM | POA: Insufficient documentation

## 2011-08-27 HISTORY — DX: Chlamydial infection, unspecified: A74.9

## 2011-08-27 HISTORY — DX: Gonococcal infection, unspecified: A54.9

## 2011-08-27 HISTORY — DX: Reserved for concepts with insufficient information to code with codable children: IMO0002

## 2011-08-27 HISTORY — DX: Trichomoniasis, unspecified: A59.9

## 2011-08-27 LAB — URINALYSIS, ROUTINE W REFLEX MICROSCOPIC
Nitrite: NEGATIVE
Specific Gravity, Urine: 1.025 (ref 1.005–1.030)
pH: 6 (ref 5.0–8.0)

## 2011-08-27 LAB — URINE MICROSCOPIC-ADD ON

## 2011-08-27 LAB — COMPREHENSIVE METABOLIC PANEL
Albumin: 3.4 g/dL — ABNORMAL LOW (ref 3.5–5.2)
BUN: 8 mg/dL (ref 6–23)
Chloride: 103 mEq/L (ref 96–112)
Creatinine, Ser: 0.79 mg/dL (ref 0.50–1.10)
GFR calc Af Amer: >90 mL/min (ref >90)
Total Bilirubin: 0.4 mg/dL (ref 0.3–1.2)

## 2011-08-27 LAB — CBC
HCT: 36.9 % (ref 36.0–46.0)
MCHC: 30.9 g/dL (ref 30.0–36.0)
MCV: 81.8 fL (ref 78.0–100.0)
RDW: 13.6 % (ref 11.5–15.5)

## 2011-08-27 MED ORDER — ONDANSETRON HCL 4 MG PO TABS: 4.0000 mg | ORAL_TABLET | Freq: Every day | ORAL | Status: DC | PRN

## 2011-08-27 MED ORDER — RANITIDINE HCL 150 MG PO TABS: 150.0000 mg | ORAL_TABLET | Freq: Two times a day (BID) | ORAL | Status: DC

## 2011-08-27 MED ORDER — ONDANSETRON 8 MG PO TBDP
8.0000 mg | ORAL_TABLET | Freq: Once | ORAL | Status: AC
  Administered 2011-08-27: 8 mg via ORAL
  Filled 2011-08-27: qty 1

## 2011-08-27 MED ORDER — METRONIDAZOLE 500 MG PO TABS
2000.0000 mg | ORAL_TABLET | Freq: Once | ORAL | Status: AC
  Administered 2011-08-27: 2000 mg via ORAL
  Filled 2011-08-27: qty 4

## 2011-08-27 MED ORDER — GI COCKTAIL ~~LOC~~
30.0000 mL | Freq: Once | ORAL | Status: AC
  Administered 2011-08-27: 30 mL via ORAL
  Filled 2011-08-27: qty 30

## 2011-08-27 NOTE — ED Provider Notes (Signed)
Agree with above note.  Christine Azer H. 08/27/2011 9:25 PM

## 2011-08-27 NOTE — Progress Notes (Signed)
Onset of N/V for two weeks was on Depo did not get her shot in October no normal period since however has spotting on and offf, not using a birth control, coughing up green mucus

## 2011-08-27 NOTE — ED Provider Notes (Signed)
History     Chief Complaint  Patient presents with  . Emesis  . Dysuria   HPI C/O upper abd pain x 2 weeks, intermittently, normally relieved with vomiting or BM. Sometimes made worse by eating, certain smells increase nausea.3-4 episodes of vomiting in the last 2 weeks. Missed depo shot in October. Last unprotected IC about 4 weeks ago.     Past Medical History  Diagnosis Date  . Abnormal Pap smear   . Chlamydia   . Trichimoniasis   . Gonorrhea     Past Surgical History  Procedure Date  . Foot surgery 2004-2004    corns removed from both feet    Family History  Problem Relation Age of Onset  . Hypertension Mother   . Hypertension Maternal Aunt   . Hypertension Maternal Grandmother     History  Substance Use Topics  . Smoking status: Never Smoker   . Smokeless tobacco: Never Used  . Alcohol Use: 0.6 oz/week    1 Glasses of wine per week    Allergies: No Known Allergies  Prescriptions prior to admission  Medication Sig Dispense Refill  . citalopram (CELEXA) 20 MG tablet Take 1 tablet (20 mg total) by mouth daily.  30 tablet  2  . ibuprofen (ADVIL,MOTRIN) 800 MG tablet Take 1 tablet (800 mg total) by mouth every 8 (eight) hours as needed. Take with food  30 tablet  0  . Multiple Vitamins-Minerals (MULTI COMPLETE PO) Take 1 tablet by mouth daily.          Review of Systems  Constitutional: Negative.   Respiratory: Positive for cough and sputum production.   Cardiovascular: Negative.   Gastrointestinal: Positive for nausea, vomiting and abdominal pain. Negative for diarrhea and constipation.  Genitourinary: Negative for dysuria, urgency, frequency, hematuria and flank pain.       Negative for vaginal bleeding, vaginal discharge, dyspareunia  Musculoskeletal: Negative.   Neurological: Negative.   Psychiatric/Behavioral: Negative.    Physical Exam   Blood pressure 113/70, pulse 96, temperature 99.5 F (37.5 C), temperature source Oral, resp. rate 18, height  5\' 5"  (1.651 m), weight 83.462 kg (184 lb).  Physical Exam  Nursing note and vitals reviewed. Constitutional: She is oriented to person, place, and time. She appears well-developed and well-nourished. No distress.  Cardiovascular: Normal rate.   Respiratory: Effort normal.  GI: Soft. Bowel sounds are normal. She exhibits no distension and no mass. There is tenderness (epigastric). There is no rebound and no guarding.  Genitourinary: Vagina normal.  Musculoskeletal: Normal range of motion.  Neurological: She is alert and oriented to person, place, and time.  Skin: Skin is warm and dry.  Psychiatric: She has a normal mood and affect.    MAU Course  Procedures  Results for orders placed during the hospital encounter of 08/27/11 (from the past 24 hour(s))  URINALYSIS, ROUTINE W REFLEX MICROSCOPIC     Status: Abnormal   Collection Time   08/27/11  2:30 PM      Component Value Range   Color, Urine YELLOW  YELLOW    Appearance CLEAR  CLEAR    Specific Gravity, Urine 1.025  1.005 - 1.030    pH 6.0  5.0 - 8.0    Glucose, UA NEGATIVE  NEGATIVE (mg/dL)   Hgb urine dipstick SMALL (*) NEGATIVE    Bilirubin Urine NEGATIVE  NEGATIVE    Ketones, ur NEGATIVE  NEGATIVE (mg/dL)   Protein, ur NEGATIVE  NEGATIVE (mg/dL)   Urobilinogen, UA 0.2  0.0 - 1.0 (mg/dL)   Nitrite NEGATIVE  NEGATIVE    Leukocytes, UA TRACE (*) NEGATIVE   URINE MICROSCOPIC-ADD ON     Status: Abnormal   Collection Time   08/27/11  2:30 PM      Component Value Range   Squamous Epithelial / LPF FEW (*) RARE    WBC, UA 3-6  <3 (WBC/hpf)   RBC / HPF 0-2  <3 (RBC/hpf)   Bacteria, UA RARE  RARE    Urine-Other TRICHOMONAS PRESENT    POCT PREGNANCY, URINE     Status: Normal   Collection Time   08/27/11  2:39 PM      Component Value Range   Preg Test, Ur NEGATIVE    CBC     Status: Abnormal   Collection Time   08/27/11  2:57 PM      Component Value Range   WBC 6.9  4.0 - 10.5 (K/uL)   RBC 4.51  3.87 - 5.11 (MIL/uL)    Hemoglobin 11.4 (*) 12.0 - 15.0 (g/dL)   HCT 16.1  09.6 - 04.5 (%)   MCV 81.8  78.0 - 100.0 (fL)   MCH 25.3 (*) 26.0 - 34.0 (pg)   MCHC 30.9  30.0 - 36.0 (g/dL)   RDW 40.9  81.1 - 91.4 (%)   Platelets 235  150 - 400 (K/uL)  COMPREHENSIVE METABOLIC PANEL     Status: Abnormal   Collection Time   08/27/11  2:57 PM      Component Value Range   Sodium 136  135 - 145 (mEq/L)   Potassium 4.1  3.5 - 5.1 (mEq/L)   Chloride 103  96 - 112 (mEq/L)   CO2 25  19 - 32 (mEq/L)   Glucose, Bld 90  70 - 99 (mg/dL)   BUN 8  6 - 23 (mg/dL)   Creatinine, Ser 7.82  0.50 - 1.10 (mg/dL)   Calcium 9.1  8.4 - 95.6 (mg/dL)   Total Protein 6.8  6.0 - 8.3 (g/dL)   Albumin 3.4 (*) 3.5 - 5.2 (g/dL)   AST 12  0 - 37 (U/L)   ALT 12  0 - 35 (U/L)   Alkaline Phosphatase 59  39 - 117 (U/L)   Total Bilirubin 0.4  0.3 - 1.2 (mg/dL)   GFR calc non Af Amer >90  >90 (mL/min)   GFR calc Af Amer >90  >90 (mL/min)    Abd pain resolved with GI cocktail  Trich treated with Flagyl in MAU   Assessment and Plan  25 y.o. [id number] with trichomoniasis and abdominal pain Received Flagyl in MAU Rx Pepcid and Zofran D/C home, f/u at [facility]  Angelic Schnelle 08/27/2011, 2:50 PM

## 2011-08-28 ENCOUNTER — Ambulatory Visit (INDEPENDENT_AMBULATORY_CARE_PROVIDER_SITE_OTHER): Payer: BC Managed Care – PPO | Admitting: Family Medicine

## 2011-08-28 ENCOUNTER — Encounter: Payer: Self-pay | Admitting: Family Medicine

## 2011-08-28 DIAGNOSIS — R112 Nausea with vomiting, unspecified: Secondary | ICD-10-CM | POA: Insufficient documentation

## 2011-08-28 DIAGNOSIS — K5900 Constipation, unspecified: Secondary | ICD-10-CM | POA: Insufficient documentation

## 2011-08-28 MED ORDER — POLYETHYLENE GLYCOL 3350 17 GM/SCOOP PO POWD: 17.0000 g | Freq: Three times a day (TID) | ORAL | Status: AC

## 2011-08-28 NOTE — Patient Instructions (Addendum)
It was nice to meet you today. I'm sorry that you're still having issues with nausea and vomiting. I think that this may be related to constipation. You should start taking MiraLAX one capful 3 times a day until you start having one soft stool a day. You can decrease the amount that you're taking if you start having diarrhea or very loose stools. Once you start having daily soft stools, you can decrease the dose to once a day. Please come back and see your regular physician, Dr. Gwenlyn Saran, next week so she can see how you are doing and if this new plan is helping. Please be seen sooner if you start having blood in her vomit or bright green vomiting or you have blood in your stool or your stools become very black and tarry. You should also return if you start having fevers, worsening abdominal pain, or your nausea and vomiting worsens to the point where you are not able to keep anything down by mouth.    Constipation in Adults Constipation is having fewer than 2 bowel movements per week. Usually, the stools are hard. As we grow older, constipation is more common. If you try to fix constipation with laxatives, the problem may get worse. This is because laxatives taken over a long period of time make the colon muscles weaker. A low-fiber diet, not taking in enough fluids, and taking some medicines may make these problems worse. MEDICATIONS THAT MAY CAUSE CONSTIPATION  Water pills (diuretics).   Calcium channel blockers (used to control blood pressure and for the heart).   Certain pain medicines (narcotics).   Anticholinergics.   Anti-inflammatory agents.   Antacids that contain aluminum.  DISEASES THAT CONTRIBUTE TO CONSTIPATION  Diabetes.   Parkinson's disease.   Dementia.   Stroke.   Depression.   Illnesses that cause problems with salt and water metabolism.  HOME CARE INSTRUCTIONS   Constipation is usually best cared for without medicines. Increasing dietary fiber and eating more  fruits and vegetables is the best way to manage constipation.   Slowly increase fiber intake to 25 to 38 grams per day. Whole grains, fruits, vegetables, and legumes are good sources of fiber. A dietitian can further help you incorporate high-fiber foods into your diet.   Drink enough water and fluids to keep your urine clear or pale yellow.   A fiber supplement may be added to your diet if you cannot get enough fiber from foods.   Increasing your activities also helps improve regularity.   Suppositories, as suggested by your caregiver, will also help. If you are using antacids, such as aluminum or calcium containing products, it will be helpful to switch to products containing magnesium if your caregiver says it is okay.   If you have been given a liquid injection (enema) today, this is only a temporary measure. It should not be relied on for treatment of longstanding (chronic) constipation.   Stronger measures, such as magnesium sulfate, should be avoided if possible. This may cause uncontrollable diarrhea. Using magnesium sulfate may not allow you time to make it to the bathroom.  SEEK IMMEDIATE MEDICAL CARE IF:   There is bright red blood in the stool.   The constipation stays for more than 4 days.   There is belly (abdominal) or rectal pain.   You do not seem to be getting better.   You have any questions or concerns.  MAKE SURE YOU:   Understand these instructions.   Will watch your condition.  Will get help right away if you are not doing well or get worse.  Document Released: 06/23/2004 Document Revised: 06/07/2011 Document Reviewed: 05/13/2008 [facility] Patient Information 2012 [place], [place].

## 2011-08-28 NOTE — Assessment & Plan Note (Signed)
We'll start MiraLAX regimen and have patient followup with PCP next week

## 2011-08-28 NOTE — Assessment & Plan Note (Signed)
Likely secondary constipation. Only red flag is unintentional weight loss. We'll start patient on MiraLAX regimen and have her follow up with PCP in one week. At that time, could consider checking a sedimentation rate as this could be IBS or other autoimmune or inflammatory issue. Patient does have very mild anemia seen on blood work from [facility] hospital yesterday. Otherwise, BMET and CBC were within the normal limits. Patient was treated for trichomonas at that time which may also have been contributing to nausea and vomiting.

## 2011-08-28 NOTE — Progress Notes (Signed)
S: Pt comes in today for nausea/vomiting x 3 weeks. Patient reports vomiting every 2-3 days. She does not think anything brings this on. She last vomited yesterday evening. She sometimes feels lightheaded when she becomes nauseated and has a "hurting feeling" in her upper abdomen which she cannot really describe further that can states it is not sharp and not crampy it is more of an achy feeling. She reports that prior to vomiting, she often has the urge as if she needs to have a bowel movement. One she tries to go the bathroom, she becomes nauseated and vomits. After she vomits, she will have a small somewhat loose stool. She thinks it is been greater than one month since she's had a regular formed bowel movement. She has not had any real diarrhea. A small amount of stool that she has is nonbloody and has not dark and tarry. Her emesis is nonbloody and nonbilious. When she becomes nauseated she becomes very sensitive to smell. She has not started any new medications. She does not have a history of any real stomach issues. She's never had any abdominal surgeries. She has had 2 spontaneous vaginal deliveries, last pregnancy was in March. She had a history of constipation and not pregnancy. 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 She did have a cold for approximately 2 weeks which resolved approximately one week ago. She's not had any fevers or chills. She has had a substantial loss of appetite and reports a 14 pound weight loss.[facility]. She had a negative urine pregnancy test at that time. She has not tried anything for the constipation. The only new medication that she is started on methadone which was given to her last night. She was also given a prescription for Zofran, which she has not yet filled.    ROS: Per HPI  History  Smoking status  . Never Smoker   Smokeless  tobacco  . Never Used    O:  Filed Vitals:   08/28/11 0926  BP: 117/79  Pulse: 94  Temp: 98.6 F (37 C)    Gen: NAD CV: RRR, no murmur Pulm: CTA bilat, no wheezes or crackles Abd: soft, hypoactive BS x4 quad, mild periumbilical tenderness to palpation otherwise benign exam  Ext: Warm, no chronic skin changes, no edema   A/P: 25 y.o. female p/w Nausea and vomiting x3 weeks, constipation  -See problem list -f/u in 1 week

## 2011-09-15 ENCOUNTER — Telehealth: Payer: Self-pay | Admitting: Family Medicine

## 2011-09-15 NOTE — Telephone Encounter (Signed)
Patient was treated for trich recently (4 pills) but says that her urine still has a strong odor.  She has 4 pills that she did not take from an older prescription (2 months ago) and was wondering if she should take them.  Told her to go ahead and if urine still hs odor by the beginning of the week to call us back and we would bring her in for a urine specimen.

## 2011-09-15 NOTE — Telephone Encounter (Signed)
Thinks she has trich and was given metronadazole a few months ago and wants to know if she can still take it now - she used the gel at that time instead.

## 2011-09-15 NOTE — Telephone Encounter (Signed)
Pt returned phone call and requested a call back.

## 2011-11-07 ENCOUNTER — Ambulatory Visit (INDEPENDENT_AMBULATORY_CARE_PROVIDER_SITE_OTHER): Payer: BC Managed Care – PPO | Admitting: Family Medicine

## 2011-11-07 ENCOUNTER — Encounter: Payer: Self-pay | Admitting: Family Medicine

## 2011-11-07 ENCOUNTER — Telehealth: Payer: Self-pay | Admitting: Family Medicine

## 2011-11-07 VITALS — BP 128/87 | HR 88 | Temp 98.8°F | Ht 65.0 in | Wt 182.1 lb

## 2011-11-07 DIAGNOSIS — R112 Nausea with vomiting, unspecified: Secondary | ICD-10-CM

## 2011-11-07 DIAGNOSIS — R110 Nausea: Secondary | ICD-10-CM

## 2011-11-07 DIAGNOSIS — R5381 Other malaise: Secondary | ICD-10-CM

## 2011-11-07 DIAGNOSIS — N760 Acute vaginitis: Secondary | ICD-10-CM

## 2011-11-07 DIAGNOSIS — R5383 Other fatigue: Secondary | ICD-10-CM

## 2011-11-07 LAB — CBC WITH DIFFERENTIAL/PLATELET
Basophils Absolute: 0 10*3/uL (ref 0.0–0.1)
Lymphocytes Relative: 40 % (ref 12–46)
Lymphs Abs: 2 10*3/uL (ref 0.7–4.0)
Neutrophils Relative %: 51 % (ref 43–77)
Platelets: 223 10*3/uL (ref 150–400)
RBC: 4.85 MIL/uL (ref 3.87–5.11)
RDW: 13.9 % (ref 11.5–15.5)
WBC: 5 10*3/uL (ref 4.0–10.5)

## 2011-11-07 LAB — COMPREHENSIVE METABOLIC PANEL
ALT: 11 U/L (ref 0–35)
Albumin: 4.2 g/dL (ref 3.5–5.2)
CO2: 24 mEq/L (ref 19–32)
Calcium: 9.1 mg/dL (ref 8.4–10.5)
Chloride: 103 mEq/L (ref 96–112)
Glucose, Bld: 97 mg/dL (ref 70–99)
Potassium: 3.8 mEq/L (ref 3.5–5.3)
Sodium: 138 mEq/L (ref 135–145)
Total Protein: 7.2 g/dL (ref 6.0–8.3)

## 2011-11-07 LAB — POCT WET PREP (WET MOUNT): Trichomonas Wet Prep HPF POC: NEGATIVE

## 2011-11-07 LAB — POCT URINALYSIS DIPSTICK
Glucose, UA: NEGATIVE
Nitrite, UA: NEGATIVE
Protein, UA: NEGATIVE
Urobilinogen, UA: 1
pH, UA: 5.5

## 2011-11-07 LAB — POCT HEMOGLOBIN: Hemoglobin: 12.2

## 2011-11-07 LAB — POCT URINE PREGNANCY: Preg Test, Ur: NEGATIVE

## 2011-11-07 LAB — POCT UA - MICROSCOPIC ONLY

## 2011-11-07 LAB — TSH: TSH: 0.858 u[IU]/mL (ref 0.350–4.500)

## 2011-11-07 MED ORDER — ONDANSETRON HCL 4 MG PO TABS: 4.0000 mg | ORAL_TABLET | Freq: Three times a day (TID) | ORAL | Status: AC | PRN

## 2011-11-07 NOTE — Telephone Encounter (Signed)
Patient called the emergency line.  Vomiting started early this morning, loose stools since Saturday.  Already has an appt today at 2pm at [facility], but was wondering if she can go to the ER if she feels like she can't wait until then.  (no blood in her stools, ?small amount in her vomit, some postural dizziness, some abdominal pain, no chest pain/SOB, no other medical problems)  Told pt she is always able to go to the ER, but encouraged her to wait for her appt at the [facility].  Patient asked if she would get more testing done in the ER than in the clinic, but I told her not necessarily, and again encouraged her to come to the clinic appt.

## 2011-11-07 NOTE — Patient Instructions (Signed)
We are going to check a few labs that we talked about. I think this is most likely a viral gastroenteritis but we want to check other labs to make sure that nothing else is going on.  Drink plenty of fluids. If you are no longer able to take fluids, if you are having worsening abdominal pain or feeling weak and dizzy by the end of the week, come back to the clinic to be further evaluated. I will precsribe some zofran to help with the nausea.

## 2011-11-07 NOTE — Progress Notes (Signed)
Patient ID: JESSILYN CATINO    DOB: [birth year], 26 y.o.   MRN: [id number] --- Subjective:  Kissa is a 26 y.o.female who presents with nausea, vomiting and loose stools since Saturday. She had multiple loose, non bloody stools from Saturday to early Monday which then resolved and was replaced with nausea and non bilious, non bloody vomiting. She had 4 episodes of vomiting since yesterday.She has been feeling weak and dizzy with decreased appetite. She has been able to drink soda but has not eaten anything since yesterday noon. She had some associated cramping with the loose stools. She has a mild sore throat, but no congestion, cough, shortness of breath or fever.  She works in a nursing home but does not not think she has anything that has occurred there. She had an episode of nausea, vomiting and loose stools in November which was thought to be due to constipation. This has since resolved and she feels like it was worst then than it is now.  She is sexually active but took a pregnancy test which was negative. She is worried that she might have trichomonas but denies any recent discharge.    Objective: Filed Vitals:   11/07/11 1429  BP: 128/87  Pulse: 88  Temp:     Physical Examination:   General appearance - alert, anxious appearing Mouth - mucous membranes moist, pharynx normal without lesions, non erythematous  Neck - supple, no significant adenopathy Chest - clear to auscultation, no wheezes, rales or rhonchi, symmetric air entry Heart - normal rate, regular rhythm, normal S1, S2, no murmurs, rubs, clicks or gallops Abdomen - soft, non distended, mildly tender in left lower quadrant, no masses or organomegaly Extremities - peripheral pulses normal, no pedal edema, no clubbing or cyanosis Pelvic exam: normal external genitalia, vulva, vagina, cervix, uterus and adnexa, VAGINA: vaginal discharge - white and malodorous, CERVIX: normal appearing cervix without discharge or lesions, WET MOUNT  done - results: pending.

## 2011-11-07 NOTE — Assessment & Plan Note (Addendum)
Symptoms could be due to viral gastroenteritis, however given description of fatigue and weakness prior to episode as well as occurrence of similar symptoms a few months before, other causes such as thyroid disorder, inflammatory bowel, celiac disease are not excluded. UPT was ngative, excluding pregnancy. Urinalysis did not show any evidence of UTI. Specific gravity was elevated but did not have any ketones. Moreover, patient did not appear dehydrated on exam. Will check BMP to make sure that there are no electrolyte deficiencies. Will also check TSH given documented 123 pound loss since July. Will check CBC for any anemia. Will also check sed rate for any abnormal elevation. If symptoms persist, will consider GI follow up.  Since patient was convinced that her symptoms were due to trichomonas, wet prep was obtained.

## 2011-11-08 LAB — GC/CHLAMYDIA PROBE AMP, GENITAL: GC Probe Amp, Genital: NEGATIVE

## 2011-11-09 ENCOUNTER — Telehealth: Payer: Self-pay | Admitting: Family Medicine

## 2011-11-09 NOTE — Telephone Encounter (Signed)
Patient is calling for results

## 2011-11-09 NOTE — Telephone Encounter (Signed)
Left message letting patient know that all her lab work is normal.

## 2011-11-09 NOTE — Telephone Encounter (Signed)
Called pt. Left message for pt to call back. Please tell pt, that Dr.Losq will review her labs. Fwd. To Dr.Losq  .Christine Cervantes

## 2011-11-10 NOTE — Telephone Encounter (Signed)
Called pt again and left message to call back. Christine Cervantes, Christine Cervantes

## 2011-11-10 NOTE — Telephone Encounter (Signed)
Pt called back. I informed her of normal labs. Pt had questions about her wet prep (clue cells). Denies symptoms, except some odor. I advised the pt to call back, if she develops vag d/c, itching...she agreed. Fwd. To PCP for info. Lorenda Hatchet, Renato Battles

## 2012-01-01 ENCOUNTER — Ambulatory Visit (INDEPENDENT_AMBULATORY_CARE_PROVIDER_SITE_OTHER): Payer: BC Managed Care – PPO | Admitting: Family Medicine

## 2012-01-01 ENCOUNTER — Encounter: Payer: Self-pay | Admitting: Family Medicine

## 2012-01-01 VITALS — BP 134/95 | HR 77 | Temp 98.3°F | Ht 65.0 in | Wt 185.8 lb

## 2012-01-01 DIAGNOSIS — S139XXA Sprain of joints and ligaments of unspecified parts of neck, initial encounter: Secondary | ICD-10-CM

## 2012-01-01 DIAGNOSIS — M542 Cervicalgia: Secondary | ICD-10-CM

## 2012-01-01 DIAGNOSIS — S161XXA Strain of muscle, fascia and tendon at neck level, initial encounter: Secondary | ICD-10-CM

## 2012-01-01 MED ORDER — MELOXICAM 15 MG PO TABS: 15.0000 mg | ORAL_TABLET | Freq: Every day | ORAL | Status: DC

## 2012-01-01 MED ORDER — CYCLOBENZAPRINE HCL 5 MG PO TABS: 5.0000 mg | ORAL_TABLET | Freq: Three times a day (TID) | ORAL | Status: AC | PRN

## 2012-01-01 NOTE — Progress Notes (Signed)
  Subjective:    Patient ID: Christine Cervantes, female    DOB: 1985-12-08, 26 y.o.   MRN: [id number]  HPI Work in for acute neck strain x several days.  No injury or change in activtiy. 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  Has neck strain approx monthly, concerned about need for breast reduction.  Worse with certain movements, better with rest.  Taking ibuprofen with little relief.  Pain 10/10 located in right neck, no radiation.  No numbness, tingling, weakness.  I have reviewed patient's  PMH, FH, and Social history and Medications as related to this visit.  Review of Systems See HPI    Objective:   Physical Exam GEN: Alert & Oriented, No acute distress Back: no TTP over spine.  Right trapezius tight.  ROM of neck wnl.  Shoulders with normal ROM without pain.       Assessment & Plan:

## 2012-01-01 NOTE — Assessment & Plan Note (Signed)
Will treat acute strain with meloxicam and flexeril.  Agreed to write note to be out of work x 1 day as she would like to take flexeril and has a history of medicines causing sedation and has not taken flexeril before.  Advised will refer to PT as has recurrent neck strains.  Advised against breast reduction today- will try PT first to improve neck/back support and encouraged supportive bra.  Follow-up with PCP

## 2012-01-01 NOTE — Patient Instructions (Signed)
Will refer you to physical therapy to help strengthen and prevent neck strains  Meloxicam is a cousin of ibuprofen- do not take aleve, advil or ibuprofen at the same time.  Safe to take Acetaminophen or tylenol

## 2012-01-18 ENCOUNTER — Inpatient Hospital Stay (HOSPITAL_COMMUNITY)
Admission: AD | Admit: 2012-01-18 | Discharge: 2012-01-18 | Disposition: A | Discharge status: Home / Self Care | Payer: BC Managed Care – PPO | Source: Ambulatory Visit | Attending: Obstetrics and Gynecology | Admitting: Obstetrics and Gynecology

## 2012-01-18 ENCOUNTER — Encounter (HOSPITAL_COMMUNITY): Payer: Self-pay | Admitting: *Deleted

## 2012-01-18 DIAGNOSIS — N760 Acute vaginitis: Secondary | ICD-10-CM | POA: Insufficient documentation

## 2012-01-18 DIAGNOSIS — N949 Unspecified condition associated with female genital organs and menstrual cycle: Secondary | ICD-10-CM | POA: Insufficient documentation

## 2012-01-18 DIAGNOSIS — A499 Bacterial infection, unspecified: Secondary | ICD-10-CM

## 2012-01-18 DIAGNOSIS — B9689 Other specified bacterial agents as the cause of diseases classified elsewhere: Secondary | ICD-10-CM | POA: Insufficient documentation

## 2012-01-18 HISTORY — DX: Urinary tract infection, site not specified: N39.0

## 2012-01-18 HISTORY — DX: Major depressive disorder, single episode, unspecified: F32.9

## 2012-01-18 HISTORY — DX: Gestational (pregnancy-induced) hypertension without significant proteinuria, unspecified trimester: O13.9

## 2012-01-18 HISTORY — DX: Depression, unspecified: F32.A

## 2012-01-18 LAB — URINALYSIS, ROUTINE W REFLEX MICROSCOPIC
Bilirubin Urine: NEGATIVE
Glucose, UA: NEGATIVE mg/dL
Ketones, ur: NEGATIVE mg/dL
pH: 6 (ref 5.0–8.0)

## 2012-01-18 LAB — URINE MICROSCOPIC-ADD ON

## 2012-01-18 MED ORDER — METRONIDAZOLE 500 MG PO TABS: 500.0000 mg | ORAL_TABLET | Freq: Two times a day (BID) | ORAL | Status: AC

## 2012-01-18 NOTE — MAU Note (Signed)
Pt states LMP-01/06/2012, unsure if she removed tampon this past Sunday 01/14/2012, notes brown odorous vaginal d/c. Has started feeling light-headed. Feels same was as prior time she had stuck tampon.

## 2012-01-18 NOTE — Discharge Instructions (Signed)
Bacterial Vaginosis Bacterial vaginosis (BV) is a vaginal infection where the normal balance of bacteria in the vagina is disrupted. The normal balance is then replaced by an overgrowth of certain bacteria. There are several different kinds of bacteria that can cause BV. BV is the most common vaginal infection in women of childbearing age. CAUSES   The cause of BV is not fully understood. BV develops when there is an increase or imbalance of harmful bacteria.   Some activities or behaviors can upset the normal balance of bacteria in the vagina and put women at increased risk including:   Having a new sex partner or multiple sex partners.   Douching.   Using an intrauterine device (IUD) for contraception.   It is not clear what role sexual activity plays in the development of BV. However, women that have never had sexual intercourse are rarely infected with BV.  Women do not get BV from toilet seats, bedding, swimming pools or from touching objects around them.  SYMPTOMS   Grey vaginal discharge.   A fish-like odor with discharge, especially after sexual intercourse.   Itching or burning of the vagina and vulva.   Burning or pain with urination.   Some women have no signs or symptoms at all.  DIAGNOSIS  Your caregiver must examine the vagina for signs of BV. Your caregiver will perform lab tests and look at the sample of vaginal fluid through a microscope. They will look for bacteria and abnormal cells (clue cells), a pH test higher than 4.5, and a positive amine test all associated with BV.  RISKS AND COMPLICATIONS   Pelvic inflammatory disease (PID).   Infections following gynecology surgery.   Developing HIV.   Developing herpes virus.  TREATMENT  Sometimes BV will clear up without treatment. However, all women with symptoms of BV should be treated to avoid complications, especially if gynecology surgery is planned. Female partners generally do not need to be treated. However,  BV may spread between female sex partners so treatment is helpful in preventing a recurrence of BV.   BV may be treated with antibiotics. The antibiotics come in either pill or vaginal cream forms. Either can be used with nonpregnant or pregnant women, but the recommended dosages differ. These antibiotics are not harmful to the baby.   BV can recur after treatment. If this happens, a second round of antibiotics will often be prescribed.   Treatment is important for pregnant women. If not treated, BV can cause a premature delivery, especially for a pregnant woman who had a premature birth in the past. All pregnant women who have symptoms of BV should be checked and treated.   For chronic reoccurrence of BV, treatment with a type of prescribed gel vaginally twice a week is helpful.  HOME CARE INSTRUCTIONS   Finish all medication as directed by your caregiver.   Do not have sex until treatment is completed.   Tell your sexual partner that you have a vaginal infection. They should see their caregiver and be treated if they have problems, such as a mild rash or itching.   Practice safe sex. Use condoms. Only have 1 sex partner.  PREVENTION  Basic prevention steps can help reduce the risk of upsetting the natural balance of bacteria in the vagina and developing BV:  Do not have sexual intercourse (be abstinent).   Do not douche.   Use all of the medicine prescribed for treatment of BV, even if the signs and symptoms go away.     Tell your sex partner if you have BV. That way, they can be treated, if needed, to prevent reoccurrence.  SEEK MEDICAL CARE IF:   Your symptoms are not improving after 3 days of treatment.   You have increased discharge, pain, or fever.  MAKE SURE YOU:   Understand these instructions.   Will watch your condition.   Will get help right away if you are not doing well or get worse.  FOR MORE INFORMATION  Division of STD Prevention (DSTDP), Centers for Disease  Control and Prevention: www.cdc.gov/std American Social Health Association (ASHA): www.ashastd.org  Document Released: 09/25/2005 Document Revised: 09/14/2011 Document Reviewed: 03/18/2009 ExitCare Patient Information 2012 [employer]. 

## 2012-01-18 NOTE — MAU Provider Note (Signed)
History     CSN: [id number]  Arrival date and time: 01/18/12 1478   First Provider Initiated Contact with Patient 01/18/12 0900      Chief Complaint  Patient presents with  . Vaginal Discharge  . Foreign Body in Vagina   HPI Christine Cervantes is 26 y.o. [id number] presents with report of her last period as been on and off this month.  3 days ago she noted a large amount of discharge in her panties and then yesterday had less discharge but a foul odor. ? Retained tampon.  Sexually active X 1 partner for 6 months.  Hx of trich, she was treated and her partner says he was treated.  Denies fever and chills. Has some lightheaded this am.  Patient of [facility].    Past Medical History  Diagnosis Date  . Chlamydia   . Trichimoniasis   . Gonorrhea   . Pregnancy induced hypertension   . Preterm labor   . Urinary tract infection   . Abnormal Pap smear     cryo on cervix  . Depression     Past Surgical History  Procedure Date  . Foot surgery 2004-2004    corns removed from both feet, hammertoes repaired    Family History  Problem Relation Age of Onset  . Hypertension Mother   . Hypertension Maternal Aunt   . Hypertension Maternal Grandmother   . Anesthesia problems Neg Hx     History  Substance Use Topics  . Smoking status: Never Smoker   . Smokeless tobacco: Never Used  . Alcohol Use: 0.6 oz/week    1 Glasses of wine per week    Allergies: No Known Allergies  Prescriptions prior to admission  Medication Sig Dispense Refill  . Boric Acid POWD Place 1 capsule vaginally daily as needed. Dr. told pt to pour powder into a gelatin capsule, and insert into the vagina, to reduce bacterial infections      . cyclobenzaprine (FLEXERIL) 5 MG tablet Take 5 mg by mouth daily as needed. For muscle spasms        Review of Systems  Constitutional: Negative for fever and chills.  Gastrointestinal: Negative for abdominal pain.  Genitourinary:       + for vaginal odor, off and on brown  discharge.  Hx of irregular cycle this month   Physical Exam   Blood pressure 127/82, pulse 75, temperature 98.4 F (36.9 C), resp. rate 18, height 5\' 4"  (1.626 m), weight 83.632 kg (184 lb 6 oz), last menstrual period 01/06/2012, SpO2 100.00%.  Physical Exam  Constitutional: She is oriented to person, place, and time. She appears well-developed and well-nourished.  HENT:  Head: Normocephalic.  Neck: Normal range of motion.  Cardiovascular: Normal rate.   Respiratory: Effort normal.  GI: Soft. There is no tenderness.  Genitourinary: No bleeding around the vagina. No foreign body around the vagina. Vaginal discharge (small amount of malodorous discharge) found.  Neurological: She is alert and oriented to person, place, and time.  Skin: Skin is warm and dry.  Psychiatric: She has a normal mood and affect. Her behavior is normal.   Results for orders placed during the hospital encounter of 01/18/12 (from the past 24 hour(s))  URINALYSIS, ROUTINE W REFLEX MICROSCOPIC     Status: Abnormal   Collection Time   01/18/12  8:45 AM      Component Value Range   Color, Urine YELLOW  YELLOW    APPearance CLEAR  CLEAR    Specific  Gravity, Urine 1.020  1.005 - 1.030    pH 6.0  5.0 - 8.0    Glucose, UA NEGATIVE  NEGATIVE (mg/dL)   Hgb urine dipstick TRACE (*) NEGATIVE    Bilirubin Urine NEGATIVE  NEGATIVE    Ketones, ur NEGATIVE  NEGATIVE (mg/dL)   Protein, ur NEGATIVE  NEGATIVE (mg/dL)   Urobilinogen, UA 1.0  0.0 - 1.0 (mg/dL)   Nitrite NEGATIVE  NEGATIVE    Leukocytes, UA NEGATIVE  NEGATIVE   URINE MICROSCOPIC-ADD ON     Status: Abnormal   Collection Time   01/18/12  8:45 AM      Component Value Range   Squamous Epithelial / LPF FEW (*) RARE    RBC / HPF 0-2  <3 (RBC/hpf)  POCT PREGNANCY, URINE     Status: Normal   Collection Time   01/18/12  8:55 AM      Component Value Range   Preg Test, Ur NEGATIVE  NEGATIVE   WET PREP, GENITAL     Status: Abnormal   Collection Time   01/18/12  9:11  AM      Component Value Range   Yeast Wet Prep HPF POC NONE SEEN  NONE SEEN    Trich, Wet Prep NONE SEEN  NONE SEEN    Clue Cells Wet Prep HPF POC FEW (*) NONE SEEN    WBC, Wet Prep HPF POC FEW (*) NONE SEEN    MAU Course  Procedures GC/CHL culture to lab  MDM   Assessment and Plan  A:  Bacterial vaginosis  P:  Rx for Flagyl bid for 7 days, ETOH warning     Avoid intercourse during treatment  Nayeli Calvert,EVE M 01/18/2012, 9:02 AM

## 2012-01-18 NOTE — MAU Note (Signed)
Been having cycle off and on.  Yesterday noted heavy brownish d/c with foul odor.  Had had a tampon in last wk, doesn't  Remember taking it out, but it was gone in the morning.  Hx of stuck/lost tampon.

## 2012-01-22 NOTE — MAU Provider Note (Signed)
Agree with above note.  Christine Cervantes 01/22/2012 1:35 PM

## 2012-04-08 ENCOUNTER — Ambulatory Visit (INDEPENDENT_AMBULATORY_CARE_PROVIDER_SITE_OTHER): Payer: BC Managed Care – PPO | Admitting: Family Medicine

## 2012-04-08 ENCOUNTER — Encounter: Payer: Self-pay | Admitting: Family Medicine

## 2012-04-08 ENCOUNTER — Telehealth: Payer: Self-pay | Admitting: Family Medicine

## 2012-04-08 VITALS — BP 146/88 | HR 105 | Ht 64.0 in | Wt 185.7 lb

## 2012-04-08 DIAGNOSIS — M25561 Pain in right knee: Secondary | ICD-10-CM

## 2012-04-08 DIAGNOSIS — M25569 Pain in unspecified knee: Secondary | ICD-10-CM

## 2012-04-08 MED ORDER — IBUPROFEN 600 MG PO TABS: 600.0000 mg | ORAL_TABLET | Freq: Three times a day (TID) | ORAL | Status: AC

## 2012-04-08 NOTE — Telephone Encounter (Signed)
Scheduled patient an appt with [facility] overflow clinic at 3:30pm.  Gaylene Brooks, RN

## 2012-04-08 NOTE — Assessment & Plan Note (Signed)
No red flags.  Likely due to inflammation.  Will try scheduled motrin and icing x 2 weeks.  Pt interested in having knee ultrasounded-- will put in referral for Sports Medicine f/u if ice and antiinflammatories do not work.

## 2012-04-08 NOTE — Progress Notes (Signed)
S: Pt comes in today for [facility] for right knee pain.  Has been occuring for months.  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  Pain is achy in nature.  Has tried Tylenol and motrin, which have not helped. C/o some swelling, better with elevation.   Has not tried icing.  No locking, occasional popping.  No known injury.     ROS: Per HPI  History  Smoking status  . Never Smoker   Smokeless tobacco  . Never Used    O:  Filed Vitals:   04/08/12 1543  BP: 146/88  Pulse: 105    Gen: NAD Ext: Warm, no chronic skin changes, no edema Right knee:  No swelling, erythema.  + medial and lateral joint line tenderness, knee is stable, 5/5 strength bilaterally   A/P: 26 y.o. female p/w right knee pain -See problem list -f/u in 2 weeks

## 2012-04-08 NOTE — Patient Instructions (Addendum)
It was nice to meet you.  Take the motrin 3 times per day.  Ice your knee for 15 minutes 2-3 times per day.  You can use a brace at work if you feel like it helps.  Come back and be seen in 2 weeks to see if the ice and motrin are helping.

## 2012-04-08 NOTE — Telephone Encounter (Signed)
Patient is calling because of knee pain and swelling wanting to be seen today because she is out of work today.  There are no openings and everyone has been double booked and there are no cancellations.

## 2012-05-16 ENCOUNTER — Inpatient Hospital Stay (HOSPITAL_COMMUNITY)
Admission: AD | Admit: 2012-05-16 | Discharge: 2012-05-16 | Disposition: A | Discharge status: Home / Self Care | Payer: BC Managed Care – PPO | Source: Ambulatory Visit | Attending: Obstetrics & Gynecology | Admitting: Obstetrics & Gynecology

## 2012-05-16 ENCOUNTER — Encounter (HOSPITAL_COMMUNITY): Payer: Self-pay

## 2012-05-16 ENCOUNTER — Inpatient Hospital Stay (HOSPITAL_COMMUNITY): Payer: BC Managed Care – PPO

## 2012-05-16 DIAGNOSIS — O26899 Other specified pregnancy related conditions, unspecified trimester: Secondary | ICD-10-CM

## 2012-05-16 DIAGNOSIS — R109 Unspecified abdominal pain: Secondary | ICD-10-CM | POA: Insufficient documentation

## 2012-05-16 DIAGNOSIS — N7013 Chronic salpingitis and oophoritis: Secondary | ICD-10-CM | POA: Insufficient documentation

## 2012-05-16 DIAGNOSIS — O34599 Maternal care for other abnormalities of gravid uterus, unspecified trimester: Secondary | ICD-10-CM | POA: Insufficient documentation

## 2012-05-16 DIAGNOSIS — Z3687 Encounter for antenatal screening for uncertain dates: Secondary | ICD-10-CM

## 2012-05-16 DIAGNOSIS — O26859 Spotting complicating pregnancy, unspecified trimester: Secondary | ICD-10-CM

## 2012-05-16 LAB — URINALYSIS, ROUTINE W REFLEX MICROSCOPIC
Nitrite: NEGATIVE
Specific Gravity, Urine: >1.03 — ABNORMAL HIGH (ref 1.005–1.030)
Urobilinogen, UA: 2 mg/dL — ABNORMAL HIGH (ref 0.0–1.0)

## 2012-05-16 LAB — HCG, QUANTITATIVE, PREGNANCY: hCG, Beta Chain, Quant, S: 3897 m[IU]/mL — ABNORMAL HIGH (ref <5)

## 2012-05-16 LAB — WET PREP, GENITAL: Trich, Wet Prep: NONE SEEN

## 2012-05-16 LAB — POCT PREGNANCY, URINE: Preg Test, Ur: POSITIVE — AB

## 2012-05-16 NOTE — MAU Provider Note (Signed)
History     CSN: [id number]  Arrival date and time: 05/16/12 8295   First Provider Initiated Contact with Patient 05/16/12 2124      Chief Complaint  Patient presents with  . Abdominal Cramping  . Fatigue  . Emesis  . Headache   HPI Christine Cervantes is a 26 y.o. female who presents to MAU with abdominal pain and spotting in early pregnancy. Patient states she has had spotting off and on since July 30th. The abdominal pain started a few days ago. She describes the pain as cramping that comes and goes. She has been with her current sex partner off and on for 6 years. Last pap smear less than one year ago and was normal. Last sexual intercourse 3 days ago. Hx of chlamydia, trichomonas, and GC. The history was provided by the patient.  OB History    Grav Para Term Preterm Abortions TAB SAB Ect Mult Living   5 2 1 1 1  1   2       Past Medical History  Diagnosis Date  . Chlamydia   . Trichimoniasis   . Gonorrhea   . Pregnancy induced hypertension   . Preterm labor   . Urinary tract infection   . Abnormal Pap smear     cryo on cervix  . Depression     Past Surgical History  Procedure Date  . Foot surgery 2004-2004    corns removed from both feet, hammertoes repaired    Family History  Problem Relation Age of Onset  . Hypertension Mother   . Hypertension Maternal Aunt   . Hypertension Maternal Grandmother   . Anesthesia problems Neg Hx     History  Substance Use Topics  . Smoking status: Never Smoker   . Smokeless tobacco: Never Used  . Alcohol Use: 0.6 oz/week    1 Glasses of wine per week    Allergies: No Known Allergies  Prescriptions prior to admission  Medication Sig Dispense Refill  . Multiple Vitamin (MULTIVITAMIN WITH MINERALS) TABS Take 1 tablet by mouth daily.        Review of Systems  Constitutional: Negative for fever, chills and weight loss.  HENT: Negative for ear pain, nosebleeds, congestion, sore throat and neck pain.   Eyes: Negative for  blurred vision, double vision, photophobia and pain.  Respiratory: Negative for cough, shortness of breath and wheezing.   Cardiovascular: Negative for chest pain, palpitations and leg swelling.  Gastrointestinal: Positive for nausea and abdominal pain. Negative for heartburn, vomiting, diarrhea and constipation.  Genitourinary: Positive for frequency. Negative for dysuria and urgency.       Vaginal bleeding (spotting)  Musculoskeletal: Negative for myalgias and back pain.  Skin: Negative for itching and rash.  Neurological: Negative for dizziness, sensory change, speech change, seizures, weakness and headaches.  Endo/Heme/Allergies: Does not bruise/bleed easily.  Psychiatric/Behavioral: Negative for depression. The patient is not nervous/anxious.    Physical Exam   Blood pressure 135/75, pulse 82, temperature 98.1 F (36.7 C), temperature source Oral, resp. rate 16, height 5' 4.5" (1.638 m), weight 187 lb 12.8 oz (85.186 kg), last menstrual period 03/11/2012.  Physical Exam  Constitutional: She is oriented to person, place, and time. She appears well-developed and well-nourished. No distress.  HENT:  Head: Normocephalic and atraumatic.  Eyes: EOM are normal.  Neck: Neck supple.  Cardiovascular: Normal rate.   Respiratory: Effort normal.  GI: Soft. There is tenderness.       Mildly tender lower abdomen with  deep palpation. No guarding or rebound.  Genitourinary:       External genitalia without lesions. Frothy discharge vaginal vault. No blood noted. Cervix long, closed, mild CMT, bilateral adnexal tenderness. Uterus slightly enlarged.  Musculoskeletal: Normal range of motion.  Neurological: She is alert and oriented to person, place, and time.  Skin: Skin is warm and dry.  Psychiatric: She has a normal mood and affect. Her behavior is normal. Judgment and thought content normal.   Results for orders placed during the hospital encounter of 05/16/12 (from the past 24 hour(s))    URINALYSIS, ROUTINE W REFLEX MICROSCOPIC     Status: Abnormal   Collection Time   05/16/12  7:30 PM      Component Value Range   Color, Urine YELLOW  YELLOW   APPearance CLEAR  CLEAR   Specific Gravity, Urine >1.030 (*) 1.005 - 1.030   pH 6.0  5.0 - 8.0   Glucose, UA NEGATIVE  NEGATIVE mg/dL   Hgb urine dipstick NEGATIVE  NEGATIVE   Bilirubin Urine NEGATIVE  NEGATIVE   Ketones, ur NEGATIVE  NEGATIVE mg/dL   Protein, ur NEGATIVE  NEGATIVE mg/dL   Urobilinogen, UA 2.0 (*) 0.0 - 1.0 mg/dL   Nitrite NEGATIVE  NEGATIVE   Leukocytes, UA NEGATIVE  NEGATIVE  POCT PREGNANCY, URINE     Status: Abnormal   Collection Time   05/16/12  7:36 PM      Component Value Range   Preg Test, Ur POSITIVE (*) NEGATIVE  HCG, QUANTITATIVE, PREGNANCY     Status: Abnormal   Collection Time   05/16/12  7:53 PM      Component Value Range   hCG, Beta Chain, Quant, S 3897 (*) <5 mIU/mL  WET PREP, GENITAL     Status: Abnormal   Collection Time   05/16/12  9:30 PM      Component Value Range   Yeast Wet Prep HPF POC NONE SEEN  NONE SEEN   Trich, Wet Prep NONE SEEN  NONE SEEN   Clue Cells Wet Prep HPF POC FEW (*) NONE SEEN   WBC, Wet Prep HPF POC FEW (*) NONE SEEN   US Ob Comp Less 14 Wks  05/16/2012  *RADIOLOGY REPORT*  Clinical Data: Spotting.  Unsure of last menstrual period. Quantitative beta HCG is 3897.  Estimated gestational age by dates is 9 weeks 3 days.  OBSTETRIC <14 WK [place] AND TRANSVAGINAL OB US  Technique:  Both transabdominal and transvaginal ultrasound examinations were performed for complete evaluation of the gestation as well as the maternal uterus, adnexal regions, and pelvic cul-de-sac.  Transvaginal technique was performed to assess early pregnancy.  Comparison:  None.  Intrauterine gestational sac:  A single intrauterine gestational sac is not visualized. Yolk sac: Yolk sac is visualized. Embryo: Embryo is not definitively visualized. Cardiac Activity: N/A Heart Rate: N/A bpm  MSD: 7.6 mm  5 w 3 d           [place] EDC: 01/13/2013  Maternal uterus/adnexae: The uterus is anteverted.  No focal myometrial masses.  The ovaries appear symmetrical in size and shape with normal follicular changes present.  In the left adnexa.  There is a tubular cystic structure measuring 3.5 x 0.9 x 0.8 cm suggesting hydrosalpinx.  No free pelvic fluid collections.  IMPRESSION: Early intrauterine gestational sac present.  The yolk sac is visualized but embryo is not definitively identified, likely due to early gestational age.  Estimated gestational age by mean sac diameter is 5  weeks 3 days.  Tubular fluid filled structure in the left adnexa suggesting hydrosalpinx.  Original Report Authenticated By: Marlon Pel, M.D.   US Ob Transvaginal  05/16/2012  *RADIOLOGY REPORT*  Clinical Data: Spotting.  Unsure of last menstrual period. Quantitative beta HCG is 3897.  Estimated gestational age by dates is 9 weeks 3 days.  OBSTETRIC <14 WK [place] AND TRANSVAGINAL OB US  Technique:  Both transabdominal and transvaginal ultrasound examinations were performed for complete evaluation of the gestation as well as the maternal uterus, adnexal regions, and pelvic cul-de-sac.  Transvaginal technique was performed to assess early pregnancy.  Comparison:  None.  Intrauterine gestational sac:  A single intrauterine gestational sac is not visualized. Yolk sac: Yolk sac is visualized. Embryo: Embryo is not definitively visualized. Cardiac Activity: N/A Heart Rate: N/A bpm  MSD: 7.6 mm  5 w 3 d          [place] EDC: 01/13/2013  Maternal uterus/adnexae: The uterus is anteverted.  No focal myometrial masses.  The ovaries appear symmetrical in size and shape with normal follicular changes present.  In the left adnexa.  There is a tubular cystic structure measuring 3.5 x 0.9 x 0.8 cm suggesting hydrosalpinx.  No free pelvic fluid collections.  IMPRESSION: Early intrauterine gestational sac present.  The yolk sac is visualized but embryo is not definitively identified,  likely due to early gestational age.  Estimated gestational age by mean sac diameter is 5 weeks 3 days.  Tubular fluid filled structure in the left adnexa suggesting hydrosalpinx.  Original Report Authenticated By: Marlon Pel, M.D.   Assessment: 26 y.o. female @ 5 weeks 3 days gestation    Hydrosalpinx left adnexa   First trimester spotting  Plan:  Start prenatal care and prenatal vitamins   GC, Chlamydia cultures sent   Tylenol for discomfort  MAU Course: Discussed with Dr. Debroah Loop agrees with A/P  Procedures Medication List  As of 05/17/2012  4:28 AM   CONTINUE taking these medications         multivitamin with minerals Tabs           Follow-up Information    Follow up with [facility] [place]. (OB of choice)    Contact information:   [address] [facility] [address]         Kerrie Buffalo, [place], FNP, [facility] 05/16/2012, 9:25 PM

## 2012-05-16 NOTE — MAU Note (Signed)
Pt unsure of LMP, had an episode of spotting x 1 day 7/30.  Pt feeling tired, cramping and occasional vomiting.

## 2012-05-26 ENCOUNTER — Encounter (HOSPITAL_COMMUNITY): Payer: Self-pay | Admitting: *Deleted

## 2012-05-26 ENCOUNTER — Inpatient Hospital Stay (HOSPITAL_COMMUNITY)
Admission: AD | Admit: 2012-05-26 | Discharge: 2012-05-27 | Disposition: A | Discharge status: Home / Self Care | Payer: BC Managed Care – PPO | Source: Ambulatory Visit | Attending: Obstetrics and Gynecology | Admitting: Obstetrics and Gynecology

## 2012-05-26 ENCOUNTER — Inpatient Hospital Stay (HOSPITAL_COMMUNITY): Payer: BC Managed Care – PPO

## 2012-05-26 DIAGNOSIS — O210 Mild hyperemesis gravidarum: Secondary | ICD-10-CM | POA: Insufficient documentation

## 2012-05-26 DIAGNOSIS — O219 Vomiting of pregnancy, unspecified: Secondary | ICD-10-CM

## 2012-05-26 DIAGNOSIS — O26859 Spotting complicating pregnancy, unspecified trimester: Secondary | ICD-10-CM | POA: Insufficient documentation

## 2012-05-26 LAB — URINALYSIS, ROUTINE W REFLEX MICROSCOPIC
Glucose, UA: NEGATIVE mg/dL
Leukocytes, UA: NEGATIVE
Nitrite: NEGATIVE
Protein, ur: NEGATIVE mg/dL
pH: 7 (ref 5.0–8.0)

## 2012-05-26 MED ORDER — LACTATED RINGERS IV BOLUS (SEPSIS)
1000.0000 mL | Freq: Once | INTRAVENOUS | Status: AC
  Administered 2012-05-26: 1000 mL via INTRAVENOUS

## 2012-05-26 MED ORDER — ONDANSETRON 8 MG PO TBDP
8.0000 mg | ORAL_TABLET | Freq: Once | ORAL | Status: AC
  Administered 2012-05-26: 8 mg via ORAL
  Filled 2012-05-26: qty 1

## 2012-05-26 MED ORDER — SODIUM CHLORIDE 0.9 % IV SOLN
25.0000 mg | INTRAVENOUS | Status: DC
  Filled 2012-05-26: qty 1

## 2012-05-26 NOTE — MAU Provider Note (Signed)
Christine Cervantes 17 @[redacted]w[redacted]d  by LMP Chief Complaint  Patient presents with  . Hyperemesis Gravidarum  . Vaginal Bleeding     First Provider Initiated Contact with Patient 05/26/12 2236      SUBJECTIVE  HPI: She presents with nausea and vomiting all day. Does not have any antiemetics.  In addition she continues to have vaginal spotting and cramping tenderness been evaluated here at prior visits. More vaginal spotting than she has been having previously. Very concerned about viability d/t past experience of SAB. On 05/14/2012 she had ultrasound showing intrauterine gestational sac with yolk sac consistent with 5 week 3 day, no embryo seen, possible left hydrosalpinx. She had few clue cells on wet prep which was treated and she had negative GC and chlamydia.  Past Medical History  Diagnosis Date  . Chlamydia   . Trichimoniasis   . Gonorrhea   . Pregnancy induced hypertension   . Preterm labor   . Urinary tract infection   . Abnormal Pap smear     cryo on cervix  . Depression    Past Surgical History  Procedure Date  . Foot surgery 2004-2004    corns removed from both feet, hammertoes repaired   History   Social History  . Marital Status: Single    Spouse Name: N/A    Number of Children: N/A  . Years of Education: N/A   Occupational History  . Not on file.   Social History Main Topics  . Smoking status: Never Smoker   . Smokeless tobacco: Never Used  . Alcohol Use: 0.6 oz/week    1 Glasses of wine per week  . Drug Use: No  . Sexually Active: Yes    Birth Control/ Protection: None, Condom   Other Topics Concern  . Not on file   Social History Narrative  . No narrative on file   No current facility-administered medications on file prior to encounter.   Current Outpatient Prescriptions on File Prior to Encounter  Medication Sig Dispense Refill  . Multiple Vitamin (MULTIVITAMIN WITH MINERALS) TABS Take 1 tablet by mouth daily.       No Known  Allergies  ROS: Pertinent items in HPI  OBJECTIVE Blood pressure 134/70, pulse 78, temperature 98.7 F (37.1 C), temperature source Oral, resp. rate 18, height 5' 4.5" (1.638 m), weight 84.369 kg (186 lb), last menstrual period 03/11/2012, SpO2 100.00%.  GENERAL: Well-developed, well-nourished female. Looks uncomfortable and is vomiting.   HEENT: Normocephalic, good dentition HEART: normal rate RESP: normal effort ABDOMEN: Soft, nontender EXTREMITIES: Nontender, no edema NEURO: Alert and oriented LAB RESULTS  Results for orders placed during the hospital encounter of 05/26/12 (from the past 24 hour(s))  URINALYSIS, ROUTINE W REFLEX MICROSCOPIC     Status: Abnormal   Collection Time   05/26/12 10:20 PM      Component Value Range   Color, Urine YELLOW  YELLOW   APPearance CLEAR  CLEAR   Specific Gravity, Urine 1.015  1.005 - 1.030   pH 7.0  5.0 - 8.0   Glucose, UA NEGATIVE  NEGATIVE mg/dL   Hgb urine dipstick NEGATIVE  NEGATIVE   Bilirubin Urine NEGATIVE  NEGATIVE   Ketones, ur 15 (*) NEGATIVE mg/dL   Protein, ur NEGATIVE  NEGATIVE mg/dL   Urobilinogen, UA 1.0  0.0 - 1.0 mg/dL   Nitrite NEGATIVE  NEGATIVE   Leukocytes, UA NEGATIVE  NEGATIVE    IMAGING Clinical Data: Spotting. Follow-up from 05/16/2012. Estimated  gestational age from previous ultrasound  is 6 weeks 6 days. No  quantitative beta HCG results available.  TRANSVAGINAL OBSTETRIC US  Technique: Transvaginal ultrasound was performed for complete  evaluation of the gestation as well as the maternal uterus, adnexal  regions, and pelvic cul-de-sac.  Comparison: 05/16/2012  Intrauterine gestational sac: A single intrauterine gestational sac  is present. No subchorionic hemorrhage identified.  Yolk sac: Yolk sac is visualized.  Embryo: Embryo is visualized.  Cardiac Activity: Fetal cardiac activity is observed.  Heart Rate: 122 beats per minute  CRL: 7 mm w 6 d [place] EDC: 01/15/2012  Subchorionic hemorrhage: None  identified.  Maternal uterus/adnexae:  Uterine myometrium appears homogeneous. No free pelvic fluid  collections. Left ovary measures 2.9 x 1.8 cm. Central hypo dense  structure measuring 1.7 cm diameter likely representing corpus  luteum. Flow is demonstrated in the left ovary on color flow  Doppler imaging.  Right ovary measures 2.4 x 1.1 cm. Normal follicular changes.  Tubular cystic structure in the left adnexa consistent with  hydrosalpinx. No change since previous study.  IMPRESSION:  Single intrauterine pregnancy with fetal pole and yolk sac  identified. Estimated gestational age by crown-rump length is 6  weeks 6 days, representing appropriate interval growth since the  previous study. Mild hydrosalpinx again demonstrated.  Original Report Authenticated By: Marlon Pel, M.D.            External Result Report     External Result Report            Imaging     Imaging Information        ASSESSMENT  1. Nausea and vomiting in pregnancy prior to [redacted] weeks gestation   2. Spotting complicating pregnancy     PLAN  Medication List  As of 05/26/2012 10:49 PM   ASK your doctor about these medications         multivitamin with minerals Tabs   Take 1 tablet by mouth daily.           Bleeding precautions. F/U with [facility] provider of choice Pregnancy veerification letter given     [facility] 05/26/2012 10:49 PM

## 2012-05-26 NOTE — MAU Note (Signed)
Pt reports "i am spotting and cramping and vomiting", states she is nauseated all the time and thinks she may be constipated.

## 2012-05-27 DIAGNOSIS — O210 Mild hyperemesis gravidarum: Secondary | ICD-10-CM

## 2012-05-27 MED ORDER — PROMETHAZINE HCL 12.5 MG PO TABS: 12.5000 mg | ORAL_TABLET | Freq: Four times a day (QID) | ORAL | Status: DC | PRN

## 2012-05-27 MED ORDER — ONDANSETRON 4 MG PO TBDP: 4.0000 mg | ORAL_TABLET | Freq: Four times a day (QID) | ORAL | Status: AC | PRN

## 2012-06-01 ENCOUNTER — Telehealth: Payer: Self-pay | Admitting: Family Medicine

## 2012-06-01 NOTE — Telephone Encounter (Signed)
Patient called regarding hyperemesis. She was seen at MAU 7 days ago and given rx for phenergan and zofran. She states these are not helping. Advised she may try double dose of each, but cannot tolerated the side effect-makes her too drowsy. States she cannot keep down fluids at all. Advised her to present to MAU again for evaluation, as she may need IV hydration or observation.

## 2012-06-04 NOTE — MAU Provider Note (Signed)
Attestation of Attending Supervision of Advanced Practitioner: Evaluation and management procedures were performed by the PA/NP/CNM/OB Fellow under my supervision/collaboration. Chart reviewed and agree with management and plan.  Keltin Baird V 06/04/2012 7:10 PM    

## 2012-06-05 ENCOUNTER — Inpatient Hospital Stay (HOSPITAL_COMMUNITY)
Admission: AD | Admit: 2012-06-05 | Discharge: 2012-06-05 | Disposition: A | Discharge status: Home / Self Care | Payer: BC Managed Care – PPO | Source: Ambulatory Visit | Attending: Obstetrics and Gynecology | Admitting: Obstetrics and Gynecology

## 2012-06-05 DIAGNOSIS — O210 Mild hyperemesis gravidarum: Secondary | ICD-10-CM | POA: Insufficient documentation

## 2012-06-05 LAB — URINALYSIS, ROUTINE W REFLEX MICROSCOPIC
Ketones, ur: NEGATIVE mg/dL
Leukocytes, UA: NEGATIVE
Nitrite: NEGATIVE
Urobilinogen, UA: 1 mg/dL (ref 0.0–1.0)
pH: 7 (ref 5.0–8.0)

## 2012-06-05 MED ORDER — METOCLOPRAMIDE HCL 10 MG PO TABS
10.0000 mg | ORAL_TABLET | Freq: Once | ORAL | Status: AC
  Administered 2012-06-05: 10 mg via ORAL
  Filled 2012-06-05: qty 1

## 2012-06-05 MED ORDER — METOCLOPRAMIDE HCL 10 MG PO TABS: 10.0000 mg | ORAL_TABLET | Freq: Four times a day (QID) | ORAL | Status: DC | PRN

## 2012-06-05 MED ORDER — ONDANSETRON 8 MG PO TBDP: 8.0000 mg | ORAL_TABLET | Freq: Once | ORAL | Status: DC

## 2012-06-05 NOTE — MAU Provider Note (Signed)
Chief Complaint: Morning Sickness   First Provider Initiated Contact with Patient 06/05/12 2147     SUBJECTIVE HPI: Christine Cervantes is a 26 y.o. [id number] at [redacted]w[redacted]d by LMP who presents with continuation of hyperemesis. Taking Zofran and phenergan daily w/ little relief of Sx. Also reports upper abd tightness relived by passage of flatus. Denies fever, chills, sick contacts, low abd pain, VB, vaginal discharge. Viable IUP verified at previous MAU visit.  Vomits after any solid food. Able to keep down water and Sprite. C/O constipation.   Past Medical History  Diagnosis Date  . Chlamydia   . Trichimoniasis   . Gonorrhea   . Pregnancy induced hypertension   . Preterm labor   . Urinary tract infection   . Abnormal Pap smear     cryo on cervix  . Depression    OB History    Grav Para Term Preterm Abortions TAB SAB Ect Mult Living   5 2 1 1 1  1   2      # Outc Date GA Lbr Len/2nd Wgt Sex Del Anes PTL Lv   1 PRE     M SVD EPI     Comments: elevated BP   2 TRM     F SVD  No Yes   Comments: ? mass on fetal heart- no problems   3 SAB            4 GRA            5 CUR              Past Surgical History  Procedure Date  . Foot surgery 2004-2004    corns removed from both feet, hammertoes repaired   History   Social History  . Marital Status: Single    Spouse Name: N/A    Number of Children: N/A  . Years of Education: N/A   Occupational History  . Not on file.   Social History Main Topics  . Smoking status: Never Smoker   . Smokeless tobacco: Never Used  . Alcohol Use: 0.6 oz/week    1 Glasses of wine per week  . Drug Use: No  . Sexually Active: Yes    Birth Control/ Protection: None, Condom   Other Topics Concern  . Not on file   Social History Narrative  . No narrative on file   No current facility-administered medications on file prior to encounter.   Current Outpatient Prescriptions on File Prior to Encounter  Medication Sig Dispense Refill  . Multiple Vitamin  (MULTIVITAMIN WITH MINERALS) TABS Take 1 tablet by mouth daily.      . metoCLOPramide (REGLAN) 10 MG tablet Take 1 tablet (10 mg total) by mouth 4 (four) times daily as needed.  30 tablet  2  . promethazine (PHENERGAN) 12.5 MG tablet Take 1 tablet (12.5 mg total) by mouth every 6 (six) hours as needed for nausea.  30 tablet  0   No Known Allergies  ROS: Pertinent items in HPI  OBJECTIVE Blood pressure 116/73, pulse 78, temperature 98.3 F (36.8 C), temperature source Oral, resp. rate 16, height 5' 4.5" (1.638 m), weight 84.369 kg (186 lb), last menstrual period 03/11/2012. GENERAL: Well-developed, well-nourished female in no acute distress.  HEENT: Normocephalic. Mucous membranes moist HEART: normal rate RESP: normal effort ABDOMEN: Soft, non-tender EXTREMITIES: Nontender, no edema NEURO: Alert and oriented SPECULUM EXAM: deferred  LAB RESULTS Results for orders placed during the hospital encounter of 06/05/12 (from the past 24  hour(s))  URINALYSIS, ROUTINE W REFLEX MICROSCOPIC     Status: Normal   Collection Time   06/05/12  8:30 PM      Component Value Range   Color, Urine YELLOW  YELLOW   APPearance CLEAR  CLEAR   Specific Gravity, Urine 1.025  1.005 - 1.030   pH 7.0  5.0 - 8.0   Glucose, UA NEGATIVE  NEGATIVE mg/dL   Hgb urine dipstick NEGATIVE  NEGATIVE   Bilirubin Urine NEGATIVE  NEGATIVE   Ketones, ur NEGATIVE  NEGATIVE mg/dL   Protein, ur NEGATIVE  NEGATIVE mg/dL   Urobilinogen, UA 1.0  0.0 - 1.0 mg/dL   Nitrite NEGATIVE  NEGATIVE   Leukocytes, UA NEGATIVE  NEGATIVE    IMAGING NA  ED COURSE Nausea resolved w./ Reglan. No vomiting in MAU.   ASSESSMENT 1. Hyperemesis complicating pregnancy, antepartum   w/out dehydration  PLAN Discharge home Stop Zofran. Colace TID for constipation Medication List  As of 06/05/2012 11:07 PM   TAKE these medications         metoCLOPramide 10 MG tablet   Commonly known as: REGLAN   Take 1 tablet (10 mg total) by mouth 4  (four) times daily as needed.      multivitamin with minerals Tabs   Take 1 tablet by mouth daily.      promethazine 12.5 MG tablet   Commonly known as: PHENERGAN   Take 1 tablet (12.5 mg total) by mouth every 6 (six) hours as needed for nausea.           Follow-up Information    Follow up with [facility]. (As needed if symptoms worsen)    Contact information:   [address] [place] [address] [phone]         Dorathy Kinsman, [place] 06/05/2012  11:07 PM

## 2012-06-05 NOTE — MAU Note (Signed)
Had N&V for a month;

## 2012-06-06 NOTE — MAU Provider Note (Signed)
Attestation of Attending Supervision of Advanced Practitioner: Evaluation and management procedures were performed by the PA/NP/CNM/OB Fellow under my supervision/collaboration. Chart reviewed and agree with management and plan.  Gaylen Venning V 06/06/2012 4:22 AM

## 2012-06-13 ENCOUNTER — Ambulatory Visit (INDEPENDENT_AMBULATORY_CARE_PROVIDER_SITE_OTHER): Payer: BC Managed Care – PPO | Admitting: Family Medicine

## 2012-06-13 VITALS — BP 111/76 | Wt 182.0 lb

## 2012-06-13 DIAGNOSIS — Z3201 Encounter for pregnancy test, result positive: Secondary | ICD-10-CM

## 2012-06-13 DIAGNOSIS — O210 Mild hyperemesis gravidarum: Secondary | ICD-10-CM

## 2012-06-13 DIAGNOSIS — N912 Amenorrhea, unspecified: Secondary | ICD-10-CM

## 2012-06-13 DIAGNOSIS — O2690 Pregnancy related conditions, unspecified, unspecified trimester: Secondary | ICD-10-CM

## 2012-06-13 MED ORDER — VITAMIN B-6 25 MG PO TABS: 25.0000 mg | ORAL_TABLET | Freq: Three times a day (TID) | ORAL | Status: DC

## 2012-06-13 MED ORDER — DOXYLAMINE SUCCINATE 5 MG PO CHEW: 10.0000 mg | CHEWABLE_TABLET | Freq: Four times a day (QID) | ORAL | Status: DC | PRN

## 2012-06-13 NOTE — Progress Notes (Signed)
Subjective: Pt is a 26yo female at [redacted]w[redacted]d by LMP with ultrasound done on 8/8 at [facility] consistent with IUP estimated at that time to be ~[redacted]w[redacted]d. Since then, pt has been seen multiple times at [facility] for nausea/vomiting and has been prescribed phenergan, zofran, and Reglan. Pt has not established her regular OB follow-up here at [facility] yet.   Pt states that she has continued to have vomiting 2-3 times per day (about enough to fill a ziplock bag), worse with heat or when she eats solid foods. She is able to drink water and Sprite. She has not been taking prenatal vitamins as she has been afraid to try eating them for fear of vomiting more. She can eat some solid bland foods (such as steamed vegetables) in very small bites and portions but often has to stop due to nausea. Pt states she has been very depressed that her symptoms have been about the same despite meds. 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 She has also tried Mylanta, Colace, and PeptoBismol without much relief.  ROS: As above. Otherwise denies bleeding, leakage of fluids, fevers/chills. Some occasional upper abdominal pain/tightness improved after flatus. No pain down in lower abdomen, typically. Some occasional feelings of constipation but no real change in bowel habits.  Objective/Physical Exam: BP 111/76  Wt 182 lb (82.555 kg)  LMP 03/11/2012 Gen: young female in NAD, appears slightly uncomfortable but pleasant and appropriate/cooperative Cardio: RRR, no rub/murmur Pulm: CTAB, no wheezes Abd: soft, mild tenderness to palpation in LUQ, BS+ Ext: no appreciable LE edema, distal pulses 2+ and intact  Assessment: 26yo female with IUP and continued n/v despite various medications. No apparent dehydration at this time, pt able to drink water and Sprite  and eat steamed vegetables/etc a little at a time. Precepted with Dr. Gwendolyn Grant. See Problem List notes for details.  Plan: IUP at [redacted]w[redacted]d: Pt to return to clinic early next week for initial OB visit and initial prenatal labs.  Hyperemesis gravidarum: Pt instructed to continue Reglan and Phenergan as tolerated. Rx given for doxylamine and pyridoxine.

## 2012-06-13 NOTE — Patient Instructions (Signed)
Thank  You for coming in to the clinic, today! I hope we can help with your nausea. I have written prescriptions for doxylamine 5 mg chewable tablets and pyridoxine (vitamin B6) 25 mg tablets. You may take the doxylamine 2 tablets at a time, every 6 hours as needed for the nausea. You should take the pyridoxine 1 tablet at a time, three times every day.  We will check some of your initial prenatal labs today. Please make an "initial prenatal OB visit" for early next week. Please feel free to call the clinic if you have any questions or concerns.

## 2012-06-13 NOTE — Assessment & Plan Note (Signed)
A: Pt with continued N/V for 1+ month, not improved with phenergan/Reglan. Continues 2-3 times per day, ~200-300 mL.  P: Rx's given for doxylamine 5 mg chewable tablets and pyridoxine 25 mg. Pt to take doxylamine 10 mg q6h PRN and pyridoxine 25 mg TID. Follow-up at regular OB visits (to start early next week).

## 2012-06-13 NOTE — Assessment & Plan Note (Addendum)
A: Pt at [redacted]w[redacted]d by LMP. Ultrasound on 8/8 showed ~5w gestational sac with yolk sac but no definite embryo at that time and possible left hydrosalpinx. Not currently taking prenatal vitamins due to prolonged nausea/vomiting with multiple visits to [facility] and despite phenergan/Reglan. Pt not yet established for regular OB visits, no formal initial antepartum visit yet. Some weight loss over the last month likely due to difficulty with N/V. Pt also not taking prenatal vitamins regularly due to N/V.  P: Pt to return early next week for formal initiation of prenatal care and labs. Follow weights at that time. Follow-up on prenatal vitamins, as well; instructed pt to try taking them and if she is not able to keep them down, to try gummy or chewable Flintstones vitamins or similar.

## 2012-07-04 DIAGNOSIS — Z3201 Encounter for pregnancy test, result positive: Secondary | ICD-10-CM | POA: Insufficient documentation

## 2012-07-04 NOTE — Addendum Note (Signed)
Addended by: Bobbye Morton on: 07/04/2012 02:59 PM   Modules accepted: Level of Service

## 2012-08-09 ENCOUNTER — Inpatient Hospital Stay (HOSPITAL_COMMUNITY)
Admission: AD | Admit: 2012-08-09 | Discharge: 2012-08-09 | Disposition: A | Discharge status: Home / Self Care | Payer: Medicaid Other | Source: Ambulatory Visit | Attending: Obstetrics & Gynecology | Admitting: Obstetrics & Gynecology

## 2012-08-09 ENCOUNTER — Encounter (HOSPITAL_COMMUNITY): Payer: Self-pay | Admitting: *Deleted

## 2012-08-09 DIAGNOSIS — O99891 Other specified diseases and conditions complicating pregnancy: Secondary | ICD-10-CM | POA: Insufficient documentation

## 2012-08-09 DIAGNOSIS — N949 Unspecified condition associated with female genital organs and menstrual cycle: Secondary | ICD-10-CM

## 2012-08-09 DIAGNOSIS — M545 Low back pain, unspecified: Secondary | ICD-10-CM | POA: Insufficient documentation

## 2012-08-09 DIAGNOSIS — R109 Unspecified abdominal pain: Secondary | ICD-10-CM | POA: Insufficient documentation

## 2012-08-09 LAB — WET PREP, GENITAL: Trich, Wet Prep: NONE SEEN

## 2012-08-09 LAB — URINALYSIS, ROUTINE W REFLEX MICROSCOPIC
Bilirubin Urine: NEGATIVE
Hgb urine dipstick: NEGATIVE
Specific Gravity, Urine: 1.01 (ref 1.005–1.030)
Urobilinogen, UA: 1 mg/dL (ref 0.0–1.0)

## 2012-08-09 MED ORDER — ACETAMINOPHEN 325 MG PO TABS
650.0000 mg | ORAL_TABLET | Freq: Once | ORAL | Status: AC
  Administered 2012-08-09: 650 mg via ORAL
  Filled 2012-08-09: qty 2

## 2012-08-09 NOTE — MAU Note (Signed)
Pt states having lower abd pain, intermittent vaginal pressure, back pains. Has had pains x2 weeks. Denies bleeding, notes abnormal vaginal discharge, thick, only noted 1 day. Constant back pain, intermittent vaginal/abd pain.

## 2012-08-09 NOTE — MAU Provider Note (Signed)
History     CSN: [id number]  Arrival date and time: 08/09/12 1708   First Provider Initiated Contact with Patient 08/09/12 2002      Chief Complaint  Patient presents with  . Abdominal Pain   HPI Christine Cervantes is a 26 y.o. [id number] at [redacted]w[redacted]d. She presents today with low back pain and abdominal pain.   Had a period 04/11/12. She thinks that period was normal. She has not started any prenatal care at this time. She was undecided about keeping the pregnancy, and has now decided to keep it. She started having lower back pain radiating to the buttocks X 2 weeks. She thinks that she has possibly felt the baby move. Has had some white vaginal discharge.   Has been seen previously here at the [facility] clinic D/T hx of pre-term labor and delivery.  Past Medical History  Diagnosis Date  . Chlamydia   . Trichimoniasis   . Gonorrhea   . Pregnancy induced hypertension   . Preterm labor   . Urinary tract infection   . Abnormal Pap smear     cryo on cervix  . Depression     Past Surgical History  Procedure Date  . Foot surgery 2004-2004    corns removed from both feet, hammertoes repaired  . No past surgeries     Family History  Problem Relation Age of Onset  . Hypertension Mother   . Hypertension Maternal Aunt   . Hypertension Maternal Grandmother   . Anesthesia problems Neg Hx   . Other Neg Hx     History  Substance Use Topics  . Smoking status: Never Smoker   . Smokeless tobacco: Never Used  . Alcohol Use: 0.6 oz/week    1 Glasses of wine per week    Allergies: No Known Allergies  No prescriptions prior to admission    Review of Systems  Constitutional: Negative for fever and chills.  Respiratory: Negative for cough.   Cardiovascular: Negative for orthopnea.  Gastrointestinal: Positive for abdominal pain. Negative for nausea, vomiting (see HPI), diarrhea and constipation.  Genitourinary: Negative for dysuria, urgency and flank pain.  Neurological: Negative for  dizziness and headaches.   Physical Exam   Blood pressure 123/71, pulse 81, temperature 97 F (36.1 C), temperature source Oral, resp. rate 18, height 5\' 5"  (1.651 m), weight 83.519 kg (184 lb 2 oz), last menstrual period 03/11/2012.  Physical Exam  Nursing note and vitals reviewed. Constitutional: She is oriented to person, place, and time. She appears well-developed and well-nourished.  Cardiovascular: Normal rate and regular rhythm.   Respiratory: Effort normal and breath sounds normal.  GI: Soft. Bowel sounds are normal.  Genitourinary:        External:normal, clitoral piercing Vagina: small amount of white discharge Cervix: pink, smooth. Closed/thick Uterus: 17 week size Adnexa: NP  Neurological: She is alert and oriented to person, place, and time.  Skin: Skin is warm and dry.  Psychiatric: She has a normal mood and affect.    MAU Course  Procedures  Results for orders placed during the hospital encounter of 08/09/12 (from the past 24 hour(s))  URINALYSIS, ROUTINE W REFLEX MICROSCOPIC     Status: Normal   Collection Time   08/09/12  6:16 PM      Component Value Range   Color, Urine YELLOW  YELLOW   APPearance CLEAR  CLEAR   Specific Gravity, Urine 1.010  1.005 - 1.030   pH 7.0  5.0 - 8.0   Glucose,  UA NEGATIVE  NEGATIVE mg/dL   Hgb urine dipstick NEGATIVE  NEGATIVE   Bilirubin Urine NEGATIVE  NEGATIVE   Ketones, ur NEGATIVE  NEGATIVE mg/dL   Protein, ur NEGATIVE  NEGATIVE mg/dL   Urobilinogen, UA 1.0  0.0 - 1.0 mg/dL   Nitrite NEGATIVE  NEGATIVE   Leukocytes, UA NEGATIVE  NEGATIVE  WET PREP, GENITAL     Status: Abnormal   Collection Time   08/09/12  8:15 PM      Component Value Range   Yeast Wet Prep HPF POC NONE SEEN  NONE SEEN   Trich, Wet Prep NONE SEEN  NONE SEEN   Clue Cells Wet Prep HPF POC FEW (*) NONE SEEN   WBC, Wet Prep HPF POC MANY (*) NONE SEEN    Assessment and Plan   1. Round ligament pain   Tylenol PRN 2nd trimester danger signs  reviewed Will schedule appointment with [facility] clinic for [facility]   Tawnya Crook 08/09/2012, 8:02 PM

## 2012-08-09 NOTE — MAU Note (Signed)
Pt reports back and stomach pains for two weeks. Pt also reports some increased discharge 2 weeks ago and then a "clump of white discharge 4 days ago". Today pt reports normal discharge. Pt was unsure about keeping the baby and has not had prenatal yet, but now is keeping the pregnancy and is waiting for medicaid to start.

## 2012-08-10 LAB — GC/CHLAMYDIA PROBE AMP, GENITAL
Chlamydia, DNA Probe: NEGATIVE
GC Probe Amp, Genital: NEGATIVE

## 2012-08-20 ENCOUNTER — Telehealth (HOSPITAL_COMMUNITY): Payer: Self-pay

## 2012-08-26 ENCOUNTER — Encounter (HOSPITAL_COMMUNITY): Payer: Self-pay | Admitting: *Deleted

## 2012-08-26 ENCOUNTER — Emergency Department (HOSPITAL_COMMUNITY): Payer: Medicaid Other

## 2012-08-26 ENCOUNTER — Emergency Department (HOSPITAL_COMMUNITY)
Admission: EM | Admit: 2012-08-26 | Discharge: 2012-08-26 | Disposition: A | Discharge status: Home / Self Care | Payer: Medicaid Other | Attending: Emergency Medicine | Admitting: Emergency Medicine

## 2012-08-26 DIAGNOSIS — Z8619 Personal history of other infectious and parasitic diseases: Secondary | ICD-10-CM | POA: Insufficient documentation

## 2012-08-26 DIAGNOSIS — S99929A Unspecified injury of unspecified foot, initial encounter: Secondary | ICD-10-CM | POA: Insufficient documentation

## 2012-08-26 DIAGNOSIS — Z3490 Encounter for supervision of normal pregnancy, unspecified, unspecified trimester: Secondary | ICD-10-CM

## 2012-08-26 DIAGNOSIS — M25569 Pain in unspecified knee: Secondary | ICD-10-CM

## 2012-08-26 DIAGNOSIS — Y9389 Activity, other specified: Secondary | ICD-10-CM | POA: Insufficient documentation

## 2012-08-26 DIAGNOSIS — S8990XA Unspecified injury of unspecified lower leg, initial encounter: Secondary | ICD-10-CM | POA: Insufficient documentation

## 2012-08-26 DIAGNOSIS — Z8709 Personal history of other diseases of the respiratory system: Secondary | ICD-10-CM | POA: Insufficient documentation

## 2012-08-26 DIAGNOSIS — O99891 Other specified diseases and conditions complicating pregnancy: Secondary | ICD-10-CM | POA: Insufficient documentation

## 2012-08-26 DIAGNOSIS — R109 Unspecified abdominal pain: Secondary | ICD-10-CM

## 2012-08-26 LAB — URINALYSIS, ROUTINE W REFLEX MICROSCOPIC
Glucose, UA: NEGATIVE mg/dL
Leukocytes, UA: NEGATIVE
Nitrite: NEGATIVE
Protein, ur: NEGATIVE mg/dL
pH: 6.5 (ref 5.0–8.0)

## 2012-08-26 LAB — CBC WITH DIFFERENTIAL/PLATELET
Basophils Absolute: 0 10*3/uL (ref 0.0–0.1)
Basophils Relative: 0 % (ref 0–1)
MCHC: 32.3 g/dL (ref 30.0–36.0)
Neutro Abs: 4.4 10*3/uL (ref 1.7–7.7)
Neutrophils Relative %: 67 % (ref 43–77)
RDW: 12.4 % (ref 11.5–15.5)

## 2012-08-26 LAB — ABO/RH: ABO/RH(D): O POS

## 2012-08-26 LAB — COMPREHENSIVE METABOLIC PANEL
AST: 11 U/L (ref 0–37)
Albumin: 2.9 g/dL — ABNORMAL LOW (ref 3.5–5.2)
Alkaline Phosphatase: 39 U/L (ref 39–117)
Chloride: 103 mEq/L (ref 96–112)
Potassium: 3.5 mEq/L (ref 3.5–5.1)
Total Bilirubin: 0.3 mg/dL (ref 0.3–1.2)

## 2012-08-26 LAB — TYPE AND SCREEN: Antibody Screen: NEGATIVE

## 2012-08-26 MED ORDER — FENTANYL CITRATE 0.05 MG/ML IJ SOLN
50.0000 ug | Freq: Once | INTRAMUSCULAR | Status: AC
  Administered 2012-08-26: 50 ug via INTRAVENOUS

## 2012-08-26 MED ORDER — HYDROCODONE-ACETAMINOPHEN 5-325 MG PO TABS: 1.0000 | ORAL_TABLET | Freq: Three times a day (TID) | ORAL | Status: DC | PRN

## 2012-08-26 MED ORDER — SODIUM CHLORIDE 0.9 % IV SOLN
Freq: Once | INTRAVENOUS | Status: AC
  Administered 2012-08-26: 09:00:00 via INTRAVENOUS

## 2012-08-26 MED ORDER — FENTANYL CITRATE 0.05 MG/ML IJ SOLN
INTRAMUSCULAR | Status: AC
  Filled 2012-08-26: qty 2

## 2012-08-26 MED ORDER — NALBUPHINE HCL 10 MG/ML IJ SOLN
10.0000 mg | INTRAMUSCULAR | Status: DC | PRN
  Filled 2012-08-26: qty 1
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
## 2012-08-26 NOTE — ED Notes (Signed)
Patient transported to X-ray 

## 2012-08-26 NOTE — ED Notes (Signed)
Patient transported to CT 
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
## 2012-08-26 NOTE — ED Notes (Addendum)
Ortho tech paged & notified

## 2012-08-26 NOTE — ED Notes (Addendum)
Dr Bonk at bedside with ultrasound 
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
## 2012-08-26 NOTE — ED Notes (Signed)
Pt sitting up in chair. Ice pack given & applied to right knee. Awaiting ortho tech.

## 2012-08-26 NOTE — Progress Notes (Signed)
Orthopedic Tech Progress Note Patient Details:  Christine Cervantes 12-18-1985 [id number] Ace wrap applied by ED nurse.  Ortho Devices Type of Ortho Device: Ace wrap Ortho Device/Splint Location: Right Ortho Device/Splint Interventions: Application   Asia R Thompson 08/26/2012, 12:06 PM
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
## 2012-08-26 NOTE — ED Notes (Signed)
Rapid Response OB nurse remains at bedside, placed on toco

## 2012-08-31 ENCOUNTER — Encounter (HOSPITAL_COMMUNITY): Payer: Self-pay | Admitting: *Deleted

## 2012-08-31 ENCOUNTER — Inpatient Hospital Stay (HOSPITAL_COMMUNITY)
Admission: AD | Admit: 2012-08-31 | Discharge: 2012-09-01 | Disposition: A | Discharge status: Home / Self Care | Payer: Medicaid Other | Source: Ambulatory Visit | Attending: Obstetrics & Gynecology | Admitting: Obstetrics & Gynecology

## 2012-08-31 DIAGNOSIS — O0930 Supervision of pregnancy with insufficient antenatal care, unspecified trimester: Secondary | ICD-10-CM | POA: Insufficient documentation

## 2012-08-31 DIAGNOSIS — O99891 Other specified diseases and conditions complicating pregnancy: Secondary | ICD-10-CM | POA: Insufficient documentation

## 2012-08-31 DIAGNOSIS — O26872 Cervical shortening, second trimester: Secondary | ICD-10-CM

## 2012-08-31 DIAGNOSIS — O26879 Cervical shortening, unspecified trimester: Secondary | ICD-10-CM | POA: Insufficient documentation

## 2012-08-31 DIAGNOSIS — R109 Unspecified abdominal pain: Secondary | ICD-10-CM | POA: Insufficient documentation

## 2012-08-31 LAB — URINALYSIS, ROUTINE W REFLEX MICROSCOPIC
Bilirubin Urine: NEGATIVE
Ketones, ur: NEGATIVE mg/dL
Leukocytes, UA: NEGATIVE
Nitrite: NEGATIVE
Urobilinogen, UA: 1 mg/dL (ref 0.0–1.0)
pH: 6 (ref 5.0–8.0)

## 2012-08-31 MED ORDER — PROGESTERONE MICRONIZED 200 MG PO CAPS: ORAL_CAPSULE | ORAL | Status: DC

## 2012-08-31 MED ORDER — PROGESTERONE MICRONIZED 200 MG PO CAPS
200.0000 mg | ORAL_CAPSULE | Freq: Once | ORAL | Status: AC
  Administered 2012-09-01: 200 mg via VAGINAL
  Filled 2012-08-31: qty 1
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
## 2012-09-04 ENCOUNTER — Encounter (HOSPITAL_COMMUNITY): Payer: Self-pay

## 2012-09-04 ENCOUNTER — Inpatient Hospital Stay (HOSPITAL_COMMUNITY): Payer: Medicaid Other

## 2012-09-04 ENCOUNTER — Encounter: Payer: Self-pay | Admitting: Advanced Practice Midwife

## 2012-09-04 ENCOUNTER — Inpatient Hospital Stay (HOSPITAL_COMMUNITY)
Admission: AD | Admit: 2012-09-04 | Discharge: 2012-09-04 | Disposition: A | Discharge status: Home / Self Care | Payer: Medicaid Other | Source: Ambulatory Visit | Attending: Family Medicine | Admitting: Family Medicine

## 2012-09-04 ENCOUNTER — Ambulatory Visit (INDEPENDENT_AMBULATORY_CARE_PROVIDER_SITE_OTHER): Payer: Self-pay | Admitting: Advanced Practice Midwife

## 2012-09-04 VITALS — BP 110/71 | Temp 97.2°F | Wt 182.2 lb

## 2012-09-04 DIAGNOSIS — O09219 Supervision of pregnancy with history of pre-term labor, unspecified trimester: Secondary | ICD-10-CM

## 2012-09-04 DIAGNOSIS — O26879 Cervical shortening, unspecified trimester: Secondary | ICD-10-CM | POA: Diagnosis present

## 2012-09-04 DIAGNOSIS — O4410 Placenta previa with hemorrhage, unspecified trimester: Secondary | ICD-10-CM

## 2012-09-04 DIAGNOSIS — O2690 Pregnancy related conditions, unspecified, unspecified trimester: Secondary | ICD-10-CM

## 2012-09-04 DIAGNOSIS — O4420 Partial placenta previa NOS or without hemorrhage, unspecified trimester: Secondary | ICD-10-CM | POA: Insufficient documentation

## 2012-09-04 DIAGNOSIS — O09899 Supervision of other high risk pregnancies, unspecified trimester: Secondary | ICD-10-CM | POA: Insufficient documentation

## 2012-09-04 DIAGNOSIS — O09299 Supervision of pregnancy with other poor reproductive or obstetric history, unspecified trimester: Secondary | ICD-10-CM

## 2012-09-04 DIAGNOSIS — O4700 False labor before 37 completed weeks of gestation, unspecified trimester: Secondary | ICD-10-CM | POA: Insufficient documentation

## 2012-09-04 LAB — POCT URINALYSIS DIP (DEVICE)
Bilirubin Urine: NEGATIVE
Hgb urine dipstick: NEGATIVE
Ketones, ur: NEGATIVE mg/dL
Protein, ur: NEGATIVE mg/dL
Specific Gravity, Urine: >=1.03 (ref 1.005–1.030)
pH: 6 (ref 5.0–8.0)

## 2012-09-04 NOTE — Progress Notes (Signed)
P = 89 Lower pelvic and vaginal pressure
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
## 2012-09-04 NOTE — MAU Note (Signed)
Patient states she was sent to [facility] for evaluation of preterm contractions. Patient states she was told her cervix was short. Patient states she has been feeling some contractions and lower abdominal pain off and on. Has felt fetal movement. Denies bleeding or discharge.

## 2012-09-04 NOTE — MAU Note (Signed)
Patient is sent from the clinic to be monitored for preterm contractions. She c/o contractions when she is standing or walking. She states that she does not feel the contractions when she lays down. She denies vaginal bleeding or lof. She reports good fetal movement.
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
## 2012-09-05 ENCOUNTER — Encounter: Payer: Self-pay | Admitting: Advanced Practice Midwife

## 2012-09-05 LAB — OBSTETRIC PANEL
Antibody Screen: NEGATIVE
Eosinophils Absolute: 0.1 10*3/uL (ref 0.0–0.7)
Eosinophils Relative: 2 % (ref 0–5)
HCT: 32.3 % — ABNORMAL LOW (ref 36.0–46.0)
Hemoglobin: 10.6 g/dL — ABNORMAL LOW (ref 12.0–15.0)
Hepatitis B Surface Ag: NEGATIVE
Lymphs Abs: 1.2 10*3/uL (ref 0.7–4.0)
MCH: 27.4 pg (ref 26.0–34.0)
MCHC: 32.8 g/dL (ref 30.0–36.0)
MCV: 83.5 fL (ref 78.0–100.0)
Monocytes Absolute: 0.5 10*3/uL (ref 0.1–1.0)
Monocytes Relative: 7 % (ref 3–12)
Neutrophils Relative %: 75 % (ref 43–77)
RBC: 3.87 MIL/uL (ref 3.87–5.11)
Rh Type: POSITIVE

## 2012-09-06 NOTE — MAU Provider Note (Signed)
Chart reviewed and agree with management and plan.  

## 2012-09-09 ENCOUNTER — Ambulatory Visit (HOSPITAL_COMMUNITY)
Admission: RE | Admit: 2012-09-09 | Discharge: 2012-09-09 | Disposition: A | Discharge status: Home / Self Care | Payer: Medicaid Other | Source: Ambulatory Visit | Attending: Advanced Practice Midwife | Admitting: Advanced Practice Midwife

## 2012-09-09 DIAGNOSIS — Z363 Encounter for antenatal screening for malformations: Secondary | ICD-10-CM | POA: Insufficient documentation

## 2012-09-09 DIAGNOSIS — O358XX0 Maternal care for other (suspected) fetal abnormality and damage, not applicable or unspecified: Secondary | ICD-10-CM | POA: Insufficient documentation

## 2012-09-09 DIAGNOSIS — O4420 Partial placenta previa NOS or without hemorrhage, unspecified trimester: Secondary | ICD-10-CM

## 2012-09-09 DIAGNOSIS — Z1389 Encounter for screening for other disorder: Secondary | ICD-10-CM | POA: Insufficient documentation

## 2012-09-09 DIAGNOSIS — O09899 Supervision of other high risk pregnancies, unspecified trimester: Secondary | ICD-10-CM

## 2012-09-09 DIAGNOSIS — Z8751 Personal history of pre-term labor: Secondary | ICD-10-CM | POA: Insufficient documentation

## 2012-09-09 LAB — CULTURE, OB URINE

## 2012-09-12 ENCOUNTER — Ambulatory Visit (INDEPENDENT_AMBULATORY_CARE_PROVIDER_SITE_OTHER): Payer: Self-pay | Admitting: Advanced Practice Midwife

## 2012-09-12 ENCOUNTER — Encounter: Payer: Self-pay | Admitting: Advanced Practice Midwife

## 2012-09-12 ENCOUNTER — Other Ambulatory Visit (HOSPITAL_COMMUNITY)
Admission: RE | Admit: 2012-09-12 | Discharge: 2012-09-12 | Disposition: A | Discharge status: Home / Self Care | Payer: Self-pay | Source: Ambulatory Visit | Attending: Advanced Practice Midwife | Admitting: Advanced Practice Midwife

## 2012-09-12 VITALS — BP 114/72 | Temp 97.6°F | Wt 182.5 lb

## 2012-09-12 DIAGNOSIS — O26879 Cervical shortening, unspecified trimester: Secondary | ICD-10-CM

## 2012-09-12 DIAGNOSIS — O2390 Unspecified genitourinary tract infection in pregnancy, unspecified trimester: Secondary | ICD-10-CM

## 2012-09-12 DIAGNOSIS — N390 Urinary tract infection, site not specified: Secondary | ICD-10-CM

## 2012-09-12 DIAGNOSIS — N760 Acute vaginitis: Secondary | ICD-10-CM | POA: Insufficient documentation

## 2012-09-12 DIAGNOSIS — O9989 Other specified diseases and conditions complicating pregnancy, childbirth and the puerperium: Secondary | ICD-10-CM

## 2012-09-12 DIAGNOSIS — N898 Other specified noninflammatory disorders of vagina: Secondary | ICD-10-CM

## 2012-09-12 DIAGNOSIS — O26899 Other specified pregnancy related conditions, unspecified trimester: Secondary | ICD-10-CM

## 2012-09-12 DIAGNOSIS — O2340 Unspecified infection of urinary tract in pregnancy, unspecified trimester: Secondary | ICD-10-CM

## 2012-09-12 LAB — POCT URINALYSIS DIP (DEVICE)
Glucose, UA: NEGATIVE mg/dL
Specific Gravity, Urine: >=1.03 (ref 1.005–1.030)
Urobilinogen, UA: 1 mg/dL (ref 0.0–1.0)
pH: 5.5 (ref 5.0–8.0)

## 2012-09-12 MED ORDER — AMPICILLIN 500 MG PO CAPS: 500.0000 mg | ORAL_CAPSULE | Freq: Four times a day (QID) | ORAL | Status: DC

## 2012-09-12 NOTE — Progress Notes (Signed)
P = 88 Pressure in lower abdomen and vaginal area Concerned about LOF

## 2012-09-12 NOTE — Patient Instructions (Addendum)
Preterm Labor Preterm labor is when labor starts at less than 37 weeks of pregnancy. The normal length of a pregnancy is 39 to 41 weeks. CAUSES Often, there is no identifiable underlying cause as to why a woman goes into preterm labor. However, one of the most common known causes of preterm labor is infection. Infections of the uterus, cervix, vagina, amniotic sac, bladder, kidney, or even the lungs (pneumonia) can cause labor to start. Other causes of preterm labor include:  Urogenital infections, such as yeast infections and bacterial vaginosis.  Uterine abnormalities (uterine shape, uterine septum, fibroids, bleeding from the placenta).  A cervix that has been operated on and opens prematurely.  Malformations in the baby.  Multiple gestations (twins, triplets, and so on).  Breakage of the amniotic sac. Additional risk factors for preterm labor include:  Previous history of preterm labor.  Premature rupture of membranes (PROM).  A placenta that covers the opening of the cervix (placenta previa).  A placenta that separates from the uterus (placenta abruption).  A cervix that is too weak to hold the baby in the uterus (incompetence cervix).  Having too much fluid in the amniotic sac (polyhydramnios).  Taking illegal drugs or smoking while pregnant.  Not gaining enough weight while pregnant.  Women younger than 14 and older than 29 years old.  Low socioeconomic status.  African-American ethnicity. SYMPTOMS Signs and symptoms of preterm labor include:  Menstrual-like cramps.  Contractions that are 30 to 70 seconds apart, become very regular, closer together, and are more intense and painful.  Contractions that start on the top of the uterus and spread down to the lower abdomen and back.  A sense of increased pelvic pressure or back pain.  A watery or bloody discharge that comes from the vagina. DIAGNOSIS  A diagnosis can be confirmed by:  A vaginal exam.  An  ultrasound of the cervix.  Sampling (swabbing) cervico-vaginal secretions. These samples can be tested for the presence of fetal fibronectin. This is a protein found in cervical discharge which is associated with preterm labor.  Fetal monitoring. TREATMENT  Depending on the length of the pregnancy and other circumstances, a caregiver may suggest bed rest. If necessary, there are medicines that can be given to stop contractions and to quicken fetal lung maturity. If labor happens before 34 weeks of pregnancy, a prolonged hospital stay may be recommended. Treatment depends on the condition of both the mother and baby. PREVENTION There are some things a mother can do to lower the risk of preterm labor in future pregnancies. A woman can:   Stop smoking.  Maintain healthy weight gain and avoid chemicals and drugs that are not necessary.  Be watchful for any type of infection.  Inform her caregiver if she has a known history of preterm labor. Document Released: 12/16/2003 Document Revised: 12/18/2011 Document Reviewed: 01/20/2011 [facility] Patient Information 2013 [place], [place].   Urinary Tract Infection Urinary tract infections (UTIs) can develop anywhere along your urinary tract. Your urinary tract is your body's drainage system for removing wastes and extra water. Your urinary tract includes two kidneys, two ureters, a bladder, and a urethra. Your kidneys are a pair of bean-shaped organs. Each kidney is about the size of your fist. They are located below your ribs, one on each side of your spine. CAUSES Infections are caused by microbes, which are microscopic organisms, including fungi, viruses, and bacteria. These organisms are so small that they can only be seen through a microscope. Bacteria are the  microbes that most commonly cause UTIs. SYMPTOMS  Symptoms of UTIs may vary by age and gender of the patient and by the location of the infection. Symptoms in young women typically include a  frequent and intense urge to urinate and a painful, burning feeling in the bladder or urethra during urination. Older women and men are more likely to be tired, shaky, and weak and have muscle aches and abdominal pain. A fever may mean the infection is in your kidneys. Other symptoms of a kidney infection include pain in your back or sides below the ribs, nausea, and vomiting. DIAGNOSIS To diagnose a UTI, your caregiver will ask you about your symptoms. Your caregiver also will ask to provide a urine sample. The urine sample will be tested for bacteria and white blood cells. White blood cells are made by your body to help fight infection. TREATMENT  Typically, UTIs can be treated with medication. Because most UTIs are caused by a bacterial infection, they usually can be treated with the use of antibiotics. The choice of antibiotic and length of treatment depend on your symptoms and the type of bacteria causing your infection. HOME CARE INSTRUCTIONS  If you were prescribed antibiotics, take them exactly as your caregiver instructs you. Finish the medication even if you feel better after you have only taken some of the medication.  Drink enough water and fluids to keep your urine clear or pale yellow.  Avoid caffeine, tea, and carbonated beverages. They tend to irritate your bladder.  Empty your bladder often. Avoid holding urine for long periods of time.  Empty your bladder before and after sexual intercourse.  After a bowel movement, women should cleanse from front to back. Use each tissue only once. SEEK MEDICAL CARE IF:   You have back pain.  You develop a fever.  Your symptoms do not begin to resolve within 3 days. SEEK IMMEDIATE MEDICAL CARE IF:   You have severe back pain or lower abdominal pain.  You develop chills.  You have nausea or vomiting.  You have continued burning or discomfort with urination. MAKE SURE YOU:   Understand these instructions.  Will watch your  condition.  Will get help right away if you are not doing well or get worse. Document Released: 07/05/2005 Document Revised: 03/26/2012 Document Reviewed: 11/03/2011 [facility] Patient Information 2013 [place], [place].

## 2012-09-12 NOTE — Progress Notes (Signed)
Pelvic pressure and contractions improved since last visit, but still present. Also reports dampness in underwear. Unsure color. Fern neg. Wet prep ordered. Dx. UTI 09/04/12. Will Rx Ampicillin to cover Proteus and Enterococcus. Anatomy scan normal growth, CL 4.02???, Placenta posterior contradicts findings from limited US 09/04/12 showing marginal previa and CL 2.2 cm. Consulted w/ Dr. Penne Lash. [place] for placentation and CL ordered. Very gentle SSE show no visible dilation of cervix. Cannot afford Prometrium. Agrees to 17-P. Will order and start ASAP.

## 2012-09-12 NOTE — Progress Notes (Signed)
Nutrition Note:  1st visit with RD Diagnosis:  Pt seen for general nutrition education.  Hx of prenatal delivery and pregnancy induced hypertension. Obese prior to pregnancy.  Wt. Loss of .5# currently.  Pt reports drinking Boost with previous pregnancy to gain adequate wt. Pt given verbal and written diet instruction for pregnancy.  No food allergies.  Takes Flinstones vitamins occ.  Meal pattern varies.  Generally 1 meal and 2 snacks/day.   Discussed weight gain goals of 11-20#.  Discussed meal pattern of 3 meals and 2-3 snacks/day.  Discussed regular vitamin intake. Started application for [facility] today. F/U in 2-4 weeks Candice C. Earlene Plater, MPH, RD, LDN

## 2012-09-12 NOTE — Progress Notes (Signed)
U/S scheduled 09/17/12 at 4 pm with MFM.

## 2012-09-17 ENCOUNTER — Other Ambulatory Visit: Payer: Self-pay | Admitting: Advanced Practice Midwife

## 2012-09-17 ENCOUNTER — Encounter (HOSPITAL_COMMUNITY): Payer: Self-pay

## 2012-09-17 ENCOUNTER — Ambulatory Visit (HOSPITAL_COMMUNITY)
Admission: RE | Admit: 2012-09-17 | Discharge: 2012-09-17 | Disposition: A | Discharge status: Home / Self Care | Payer: Medicaid Other | Source: Ambulatory Visit | Attending: Advanced Practice Midwife | Admitting: Advanced Practice Midwife

## 2012-09-17 VITALS — BP 106/62 | HR 89 | Wt 185.0 lb

## 2012-09-17 DIAGNOSIS — O358XX0 Maternal care for other (suspected) fetal abnormality and damage, not applicable or unspecified: Secondary | ICD-10-CM | POA: Insufficient documentation

## 2012-09-17 DIAGNOSIS — N760 Acute vaginitis: Secondary | ICD-10-CM

## 2012-09-17 DIAGNOSIS — O4420 Partial placenta previa NOS or without hemorrhage, unspecified trimester: Secondary | ICD-10-CM

## 2012-09-17 DIAGNOSIS — O26899 Other specified pregnancy related conditions, unspecified trimester: Secondary | ICD-10-CM

## 2012-09-17 DIAGNOSIS — Z8751 Personal history of pre-term labor: Secondary | ICD-10-CM | POA: Insufficient documentation

## 2012-09-17 DIAGNOSIS — O26879 Cervical shortening, unspecified trimester: Secondary | ICD-10-CM

## 2012-09-17 DIAGNOSIS — O09899 Supervision of other high risk pregnancies, unspecified trimester: Secondary | ICD-10-CM

## 2012-09-17 MED ORDER — METRONIDAZOLE 500 MG PO TABS: 500.0000 mg | ORAL_TABLET | Freq: Two times a day (BID) | ORAL | Status: DC

## 2012-09-17 NOTE — Progress Notes (Signed)
Ms. Smylie was seen for ultrasound appointment today.  Please see [facility] report for details.  

## 2012-09-17 NOTE — Progress Notes (Signed)
Dx BV 09/12/12. Rx Flagyl.

## 2012-09-18 NOTE — Progress Notes (Signed)
Christine Cervantes, and unable to leave a message- did hear message that this mailbox is full.  Pt. Has appt 09/19/12

## 2012-09-19 ENCOUNTER — Encounter: Payer: Self-pay | Admitting: Family

## 2012-09-19 ENCOUNTER — Encounter: Payer: Self-pay | Admitting: Advanced Practice Midwife

## 2012-09-26 ENCOUNTER — Ambulatory Visit (INDEPENDENT_AMBULATORY_CARE_PROVIDER_SITE_OTHER): Payer: Self-pay | Admitting: Family

## 2012-09-26 VITALS — BP 117/75 | Wt 186.6 lb

## 2012-09-26 DIAGNOSIS — O09899 Supervision of other high risk pregnancies, unspecified trimester: Secondary | ICD-10-CM

## 2012-09-26 DIAGNOSIS — O26879 Cervical shortening, unspecified trimester: Secondary | ICD-10-CM

## 2012-09-26 LAB — POCT URINALYSIS DIP (DEVICE)
Bilirubin Urine: NEGATIVE
Ketones, ur: NEGATIVE mg/dL
pH: 7 (ref 5.0–8.0)

## 2012-09-26 MED ORDER — HYDROXYPROGESTERONE CAPROATE 250 MG/ML IM OIL
250.0000 mg | TOPICAL_OIL | Freq: Once | INTRAMUSCULAR | Status: AC
  Administered 2012-09-26: 250 mg via INTRAMUSCULAR

## 2012-09-26 NOTE — Progress Notes (Signed)
Pulse: 93

## 2012-09-26 NOTE — Addendum Note (Signed)
Addended by: Jill Side on: 09/26/2012 12:34 PM   Modules accepted: Orders

## 2012-09-26 NOTE — Progress Notes (Signed)
No concerns today, contractions have lessened; Unable to do Prometrium due to Medicaid pending; obtain 17-p today.

## 2012-10-04 ENCOUNTER — Ambulatory Visit: Payer: Self-pay

## 2012-10-09 NOTE — L&D Delivery Note (Signed)
Delivery Note  KARIMAH WINQUIST is a 27 y.o. N5A2130 admitted at [redacted]w[redacted]d in active labor. She progressed to complete dilation and SVD.   At 11:39 PM a viable and healthy female was delivered via Vaginal, Spontaneous Delivery (Presentation: ; Occiput Anterior).  APGAR: 8, 9; weight pending.   Placenta status: Intact, Spontaneous.  Cord: 3 vessels with the following complications: None.  Cord pH: n/a  Anesthesia: Epidural  Episiotomy: None Lacerations: None Suture Repair: none Est. Blood Loss (mL): 250  Mom to postpartum.  Baby to nursery-stable.  MCGILL,JACQUELYN 01/02/2013, 11:54 PM  I was present for entire delivery and agree with above resident note. Napoleon Form, MD

## 2012-10-10 ENCOUNTER — Encounter: Payer: Self-pay | Admitting: Family

## 2012-10-22 ENCOUNTER — Encounter (HOSPITAL_COMMUNITY): Payer: Self-pay | Admitting: *Deleted

## 2012-10-22 ENCOUNTER — Inpatient Hospital Stay (HOSPITAL_COMMUNITY)
Admission: AD | Admit: 2012-10-22 | Discharge: 2012-10-22 | Disposition: A | Discharge status: Home / Self Care | Payer: Medicaid Other | Source: Ambulatory Visit | Attending: Family Medicine | Admitting: Family Medicine

## 2012-10-22 DIAGNOSIS — B9689 Other specified bacterial agents as the cause of diseases classified elsewhere: Secondary | ICD-10-CM

## 2012-10-22 DIAGNOSIS — N760 Acute vaginitis: Secondary | ICD-10-CM

## 2012-10-22 DIAGNOSIS — N949 Unspecified condition associated with female genital organs and menstrual cycle: Secondary | ICD-10-CM

## 2012-10-22 DIAGNOSIS — A499 Bacterial infection, unspecified: Secondary | ICD-10-CM

## 2012-10-22 DIAGNOSIS — R109 Unspecified abdominal pain: Secondary | ICD-10-CM

## 2012-10-22 LAB — URINALYSIS, ROUTINE W REFLEX MICROSCOPIC
Bilirubin Urine: NEGATIVE
Hgb urine dipstick: NEGATIVE
Ketones, ur: NEGATIVE mg/dL
Specific Gravity, Urine: 1.02 (ref 1.005–1.030)
pH: 6 (ref 5.0–8.0)

## 2012-10-22 LAB — WET PREP, GENITAL: Trich, Wet Prep: NONE SEEN

## 2012-10-22 MED ORDER — METRONIDAZOLE 500 MG PO TABS: 500.0000 mg | ORAL_TABLET | Freq: Two times a day (BID) | ORAL | Status: DC

## 2012-10-22 MED ORDER — OXYCODONE-ACETAMINOPHEN 5-325 MG PO TABS
1.0000 | ORAL_TABLET | Freq: Once | ORAL | Status: AC
  Administered 2012-10-22: 1 via ORAL
  Filled 2012-10-22: qty 1

## 2012-10-22 MED ORDER — CYCLOBENZAPRINE HCL 10 MG PO TABS
10.0000 mg | ORAL_TABLET | Freq: Once | ORAL | Status: AC
  Administered 2012-10-22: 10 mg via ORAL
  Filled 2012-10-22: qty 1

## 2012-10-22 NOTE — MAU Note (Signed)
Patient taken directly from lobby to room 10 in MAU

## 2012-10-22 NOTE — MAU Provider Note (Signed)
I saw this patient with the PA student. I agree with his assessment with the following additions.   A: BV Round ligament pain  P: Discharge patient [facility] with an appointment Rx for Flagyl sent to patient's pharmacy. Patient had not taken Rx last time because she uses a "home remedy". Agrees to take Rx this time.  Patient may return to MAU as needed  Freddi Starr, PA-C 10/22/2012 12:19 PM

## 2012-10-22 NOTE — MAU Note (Signed)
Pt C/O aching all over, intense pain in vagina.  Denies discharge or bleeding, LOF.  Pain has been for 2 days.

## 2012-10-22 NOTE — MAU Provider Note (Signed)
History  Patient presents to the MAU with abdominal pain, generalized body aches, and vaginal pain.  The patient states that the symptoms began two days ago and have persisted.  The patient states that her abdominal pain is not relieved with flatus, is colicky, and is a 10/10 when present.  Her baseline abdominal pain is an 8.  The patient states that movement makes the pain worse and rest alleviates most of her pain .  The patient's body aches began around the same time as the abdominal pain and is hard to localize.  Her vaginal pain is also present with movement and alleviated with rest.  The patient has not had these symptoms in the past.  The patient's last sexual intercourse was one week ago and she denies any sick contacts.  She has a history of Chlamydia, Trich, and Gonorrhea but states no recent STI's.  The patient complains of no vaginal bleeding or discharge.  The patient has had no fever, chills, night sweats, or syncope.  The patient does not complain of headaches, SOB, chest pain, bowel/bladder issues, swelling of the hands feet, or face.  The patients does not indicate any rashes.  CSN: [id number]  Arrival date and time: 10/22/12 4098   First Provider Initiated Contact with Patient 10/22/12 1020      Chief Complaint  Patient presents with  . Generalized Body Aches  . Vaginal Pain   HPI  Pertinent Gynecological History:   Contraception: none DES exposure: unknown Blood transfusions: unknown Sexually transmitted diseases: past history: gonorrhea, chlamydia, trich Last pap: unknown Date: unknown   Past Medical History  Diagnosis Date  . Chlamydia   . Trichimoniasis   . Gonorrhea   . Pregnancy induced hypertension   . Preterm labor   . Urinary tract infection   . Abnormal Pap smear     cryo on cervix  . Depression     Past Surgical History  Procedure Date  . Foot surgery 2004-2004    corns removed from both feet, hammertoes repaired  . No past surgeries      Family History  Problem Relation Age of Onset  . Hypertension Mother   . Hypertension Maternal Aunt   . Hypertension Maternal Grandmother   . Anesthesia problems Neg Hx   . Other Neg Hx   . Diabetes Paternal Grandmother     History  Substance Use Topics  . Smoking status: Never Smoker   . Smokeless tobacco: Never Used  . Alcohol Use: No     Comment: occ before (+) UPT    Allergies: No Known Allergies  Prescriptions prior to admission  Medication Sig Dispense Refill  . flintstones complete (FLINTSTONES) 60 MG chewable tablet Chew 1 tablet by mouth daily.      Marland Kitchen ibuprofen (ADVIL,MOTRIN) 200 MG tablet Take 800 mg by mouth daily as needed. For pain      . metroNIDAZOLE (FLAGYL) 500 MG tablet Take 1 tablet (500 mg total) by mouth 2 (two) times daily.  14 tablet  0    ROS Physical Exam   Blood pressure 112/55, pulse 101, temperature 99.4 F (37.4 C), temperature source Oral, resp. rate 18, height 5\' 4"  (1.626 m), weight 190 lb 3.2 oz (86.274 kg), last menstrual period 03/11/2012.  Physical Exam   Head:atraumatic, normocephalic Heart: RRR Lungs: clear breath sounds bilaterally ABD: tender to mild palpation in RLQ, LLQ.  No hepatosplenomegaly noted. No CVA tenderness PV: good distal pulses, no peripheral edema GU: no vaginal discharge noted, off  white, medium consistency discharged noted on speculum exam.  Cervix palpable on bimanual   MAU Course  Procedures    Assessment and Plan  GC/Chlamydia screen Wet prep Patient was given flexeril and percocet for pain Referral to clinic for 17 P   Evalee Mutton 10/22/2012, 10:36 AM

## 2012-10-23 ENCOUNTER — Emergency Department (HOSPITAL_COMMUNITY)
Admission: EM | Admit: 2012-10-23 | Discharge: 2012-10-23 | Disposition: A | Discharge status: Home / Self Care | Payer: Medicaid Other | Attending: Emergency Medicine | Admitting: Emergency Medicine

## 2012-10-23 ENCOUNTER — Ambulatory Visit: Payer: Self-pay | Admitting: Family Medicine

## 2012-10-23 ENCOUNTER — Encounter (HOSPITAL_COMMUNITY): Payer: Self-pay | Admitting: *Deleted

## 2012-10-23 DIAGNOSIS — IMO0001 Reserved for inherently not codable concepts without codable children: Secondary | ICD-10-CM | POA: Insufficient documentation

## 2012-10-23 DIAGNOSIS — Z87440 Personal history of urinary (tract) infections: Secondary | ICD-10-CM | POA: Insufficient documentation

## 2012-10-23 DIAGNOSIS — R5381 Other malaise: Secondary | ICD-10-CM | POA: Insufficient documentation

## 2012-10-23 DIAGNOSIS — J3489 Other specified disorders of nose and nasal sinuses: Secondary | ICD-10-CM | POA: Insufficient documentation

## 2012-10-23 DIAGNOSIS — J111 Influenza due to unidentified influenza virus with other respiratory manifestations: Secondary | ICD-10-CM

## 2012-10-23 DIAGNOSIS — Z8659 Personal history of other mental and behavioral disorders: Secondary | ICD-10-CM | POA: Insufficient documentation

## 2012-10-23 DIAGNOSIS — O9989 Other specified diseases and conditions complicating pregnancy, childbirth and the puerperium: Secondary | ICD-10-CM | POA: Insufficient documentation

## 2012-10-23 DIAGNOSIS — R509 Fever, unspecified: Secondary | ICD-10-CM | POA: Insufficient documentation

## 2012-10-23 DIAGNOSIS — R059 Cough, unspecified: Secondary | ICD-10-CM | POA: Insufficient documentation

## 2012-10-23 DIAGNOSIS — R5383 Other fatigue: Secondary | ICD-10-CM | POA: Insufficient documentation

## 2012-10-23 DIAGNOSIS — R51 Headache: Secondary | ICD-10-CM | POA: Insufficient documentation

## 2012-10-23 DIAGNOSIS — Z8619 Personal history of other infectious and parasitic diseases: Secondary | ICD-10-CM | POA: Insufficient documentation

## 2012-10-23 DIAGNOSIS — Z3490 Encounter for supervision of normal pregnancy, unspecified, unspecified trimester: Secondary | ICD-10-CM

## 2012-10-23 DIAGNOSIS — R05 Cough: Secondary | ICD-10-CM | POA: Insufficient documentation

## 2012-10-23 DIAGNOSIS — J029 Acute pharyngitis, unspecified: Secondary | ICD-10-CM | POA: Insufficient documentation

## 2012-10-23 LAB — GC/CHLAMYDIA PROBE AMP
CT Probe RNA: NEGATIVE
GC Probe RNA: NEGATIVE

## 2012-10-23 MED ORDER — ACETAMINOPHEN 325 MG PO TABS
650.0000 mg | ORAL_TABLET | Freq: Once | ORAL | Status: AC
  Administered 2012-10-23: 650 mg via ORAL
  Filled 2012-10-23: qty 2

## 2012-10-23 MED ORDER — OSELTAMIVIR PHOSPHATE 75 MG PO CAPS: 75.0000 mg | ORAL_CAPSULE | Freq: Two times a day (BID) | ORAL | Status: DC

## 2012-10-23 NOTE — ED Notes (Signed)
Pt c/o fever and body aches with cough; pt sts [redacted] weeks pregnant and sts was seen at [facility] last night for same

## 2012-10-23 NOTE — ED Provider Notes (Signed)
History     CSN: [id number]  Arrival date & time 10/23/12  1518   First MD Initiated Contact with Patient 10/23/12 1655      Chief Complaint  Patient presents with  . Influenza    (Consider location/radiation/quality/duration/timing/severity/associated sxs/prior treatment) HPI Comments: Pt who is currently [redacted] weeks pregnant presents to the ED with complaints of flu-like symptoms of cough, congestion, sore throat, muscle aches, chills, fever, headache, and fatigue.  The patient states that the symptoms started two days ago.  Pt has been around other sick contacts and did not get the flu shot this year. The patient denies neck pain or stiffness, weakness, vision changes, severe abdominal pain, inability to eat or drink, difficulty breathing, SOB, wheezing, or chest pain. She denies vaginal discharge, vaginal bleeding, or pelvic pain.  The patient has taken Ibuprofen and Dayquil for her symptoms without relief.  She was seen at [facility] last evening and was told that everything was OK with the baby.    The history is provided by the patient.    Past Medical History  Diagnosis Date  . Chlamydia   . Trichimoniasis   . Gonorrhea   . Pregnancy induced hypertension   . Preterm labor   . Urinary tract infection   . Abnormal Pap smear     cryo on cervix  . Depression     Past Surgical History  Procedure Date  . Foot surgery 2004-2004    corns removed from both feet, hammertoes repaired  . No past surgeries     Family History  Problem Relation Age of Onset  . Hypertension Mother   . Hypertension Maternal Aunt   . Hypertension Maternal Grandmother   . Anesthesia problems Neg Hx   . Other Neg Hx   . Diabetes Paternal Grandmother     History  Substance Use Topics  . Smoking status: Never Smoker   . Smokeless tobacco: Never Used  . Alcohol Use: No     Comment: occ before (+) UPT    OB History    Grav Para Term Preterm Abortions TAB SAB Ect Mult Living   4 2 1 1 1   1   2       Review of Systems  Constitutional: Positive for fever, chills and fatigue.  HENT: Positive for congestion, sore throat and rhinorrhea. Negative for trouble swallowing, neck pain, neck stiffness, voice change and sinus pressure.   Eyes: Negative for visual disturbance.  Respiratory: Positive for cough. Negative for shortness of breath.   Cardiovascular: Negative for leg swelling.  Gastrointestinal: Negative for nausea, vomiting, abdominal pain and diarrhea.  Genitourinary: Negative for dysuria, urgency, frequency, decreased urine volume, vaginal bleeding, vaginal discharge and pelvic pain.  Skin: Negative for rash.  Neurological: Positive for headaches. Negative for dizziness, syncope and light-headedness.  Psychiatric/Behavioral: Negative for confusion.    Allergies  Review of patient's allergies indicates no known allergies.  Home Medications   Current Outpatient Rx  Name  Route  Sig  Dispense  Refill  . ACETAMINOPHEN 325 MG PO TABS   Oral   Take 650 mg by mouth once.         Marland Kitchen FLINTSTONES COMPLETE 60 MG PO CHEW   Oral   Chew 1 tablet by mouth daily.         Marland Kitchen METRONIDAZOLE 500 MG PO TABS   Oral   Take 1 tablet (500 mg total) by mouth 2 (two) times daily.   14 tablet   0  BP 109/66  Pulse 105  Temp 100 F (37.8 C) (Oral)  Resp 20  SpO2 100%  LMP 03/11/2012  Physical Exam  Nursing note and vitals reviewed. Constitutional: She appears well-developed and well-nourished. No distress.  HENT:  Head: Normocephalic and atraumatic.  Mouth/Throat: Oropharynx is clear and moist.  Eyes: EOM are normal. Pupils are equal, round, and reactive to light.  Neck: Normal range of motion. Neck supple.  Cardiovascular: Normal rate, regular rhythm and normal heart sounds.   Pulmonary/Chest: Effort normal and breath sounds normal. No respiratory distress. She has no wheezes. She has no rales.  Abdominal: Soft. Bowel sounds are normal. There is no tenderness.        Gravid  Musculoskeletal: Normal range of motion.  Neurological: She is alert.  Skin: Skin is warm and dry. No rash noted. She is not diaphoretic.  Psychiatric: She has a normal mood and affect.    ED Course  Procedures (including critical care time)  Labs Reviewed - No data to display No results found.   No diagnosis found.    MDM  Patient who is currently [redacted] weeks pregnant presents to the ED today with flu like symptoms.  Patient able to tolerate PO liquids.  She denies any pelvic pain or vaginal bleeding.  She was seen at [facility] last evening.  She is followed by an Geologist, engineering at [facility].  Patient educated to not use Ibuprofen and to use Tylenol instead.  Patient discharged home with Rx for Tamiflu.  Return precautions discussed.        Pascal Lux [place], PA-C 10/24/12 1304  Pascal Lux [place], PA-C 10/24/12 [phone]

## 2012-10-23 NOTE — ED Notes (Signed)
Pt reports flu like symptoms, generalized weakness, and fever x 2 days. Reports coughing up yellow tinged sputum.

## 2012-10-24 ENCOUNTER — Ambulatory Visit: Payer: Self-pay

## 2012-10-26 NOTE — ED Provider Notes (Signed)
Medical screening examination/treatment/procedure(s) were performed by non-physician practitioner and as supervising physician I was immediately available for consultation/collaboration.   Maelin Kurkowski L Kalem Rockwell, MD 10/26/12 0752 

## 2012-11-11 ENCOUNTER — Encounter: Payer: Self-pay | Admitting: *Deleted

## 2012-11-14 ENCOUNTER — Encounter: Payer: Self-pay | Admitting: Obstetrics & Gynecology

## 2012-11-17 ENCOUNTER — Encounter (HOSPITAL_COMMUNITY): Payer: Self-pay | Admitting: Obstetrics and Gynecology

## 2012-11-17 ENCOUNTER — Inpatient Hospital Stay (HOSPITAL_COMMUNITY)
Admission: AD | Admit: 2012-11-17 | Discharge: 2012-11-20 | DRG: 781 | Disposition: A | Discharge status: Home / Self Care | Payer: Medicaid Other | Source: Ambulatory Visit | Attending: Obstetrics and Gynecology | Admitting: Obstetrics and Gynecology

## 2012-11-17 DIAGNOSIS — B9689 Other specified bacterial agents as the cause of diseases classified elsewhere: Secondary | ICD-10-CM | POA: Diagnosis present

## 2012-11-17 DIAGNOSIS — O4700 False labor before 37 completed weeks of gestation, unspecified trimester: Secondary | ICD-10-CM | POA: Diagnosis present

## 2012-11-17 DIAGNOSIS — R05 Cough: Secondary | ICD-10-CM | POA: Diagnosis present

## 2012-11-17 DIAGNOSIS — R059 Cough, unspecified: Secondary | ICD-10-CM | POA: Diagnosis present

## 2012-11-17 DIAGNOSIS — N760 Acute vaginitis: Secondary | ICD-10-CM | POA: Diagnosis present

## 2012-11-17 DIAGNOSIS — N390 Urinary tract infection, site not specified: Secondary | ICD-10-CM | POA: Diagnosis present

## 2012-11-17 DIAGNOSIS — O4420 Partial placenta previa NOS or without hemorrhage, unspecified trimester: Secondary | ICD-10-CM

## 2012-11-17 DIAGNOSIS — O2390 Unspecified genitourinary tract infection in pregnancy, unspecified trimester: Secondary | ICD-10-CM | POA: Diagnosis present

## 2012-11-17 DIAGNOSIS — O09899 Supervision of other high risk pregnancies, unspecified trimester: Secondary | ICD-10-CM

## 2012-11-17 DIAGNOSIS — O26879 Cervical shortening, unspecified trimester: Secondary | ICD-10-CM

## 2012-11-17 DIAGNOSIS — A499 Bacterial infection, unspecified: Secondary | ICD-10-CM | POA: Diagnosis present

## 2012-11-17 DIAGNOSIS — O4410 Placenta previa with hemorrhage, unspecified trimester: Secondary | ICD-10-CM | POA: Diagnosis present

## 2012-11-17 LAB — URINALYSIS, ROUTINE W REFLEX MICROSCOPIC
Bilirubin Urine: NEGATIVE
Glucose, UA: NEGATIVE mg/dL
Hgb urine dipstick: NEGATIVE
Ketones, ur: 15 mg/dL — AB
Protein, ur: NEGATIVE mg/dL
pH: 6 (ref 5.0–8.0)

## 2012-11-17 LAB — WET PREP, GENITAL: Trich, Wet Prep: NONE SEEN

## 2012-11-17 LAB — URINE MICROSCOPIC-ADD ON

## 2012-11-17 MED ORDER — PRENATAL MULTIVITAMIN CH
1.0000 | ORAL_TABLET | Freq: Every day | ORAL | Status: DC
  Administered 2012-11-18 – 2012-11-20 (×3): 1 via ORAL
  Filled 2012-11-17 (×3): qty 1

## 2012-11-17 MED ORDER — CALCIUM CARBONATE ANTACID 500 MG PO CHEW
2.0000 | CHEWABLE_TABLET | ORAL | Status: DC | PRN
  Filled 2012-11-17: qty 2

## 2012-11-17 MED ORDER — ZOLPIDEM TARTRATE 5 MG PO TABS
5.0000 mg | ORAL_TABLET | Freq: Every evening | ORAL | Status: DC | PRN
  Administered 2012-11-18: 5 mg via ORAL
  Filled 2012-11-17: qty 1

## 2012-11-17 MED ORDER — METRONIDAZOLE 500 MG PO TABS
500.0000 mg | ORAL_TABLET | Freq: Two times a day (BID) | ORAL | Status: DC
  Administered 2012-11-17 – 2012-11-20 (×6): 500 mg via ORAL
  Filled 2012-11-17 (×7): qty 1

## 2012-11-17 MED ORDER — MAGNESIUM SULFATE 40 G IN LACTATED RINGERS - SIMPLE
2.0000 g/h | INTRAVENOUS | Status: DC
  Administered 2012-11-18: 2 g/h via INTRAVENOUS
  Filled 2012-11-17 (×2): qty 500

## 2012-11-17 MED ORDER — DOCUSATE SODIUM 100 MG PO CAPS
100.0000 mg | ORAL_CAPSULE | Freq: Every day | ORAL | Status: DC
  Administered 2012-11-20: 100 mg via ORAL
  Filled 2012-11-17 (×3): qty 1

## 2012-11-17 MED ORDER — SODIUM CHLORIDE 0.9 % IV SOLN: INTRAVENOUS | Status: DC

## 2012-11-17 MED ORDER — BETAMETHASONE SOD PHOS & ACET 6 (3-3) MG/ML IJ SUSP
12.0000 mg | INTRAMUSCULAR | Status: AC
  Administered 2012-11-17 – 2012-11-18 (×2): 12 mg via INTRAMUSCULAR
  Filled 2012-11-17 (×2): qty 2

## 2012-11-17 MED ORDER — MAGNESIUM SULFATE BOLUS VIA INFUSION
4.0000 g | Freq: Once | INTRAVENOUS | Status: AC
  Administered 2012-11-17: 4 g via INTRAVENOUS
  Filled 2012-11-17: qty 500

## 2012-11-17 MED ORDER — NIFEDIPINE 10 MG PO CAPS
10.0000 mg | ORAL_CAPSULE | Freq: Once | ORAL | Status: AC
  Administered 2012-11-17: 10 mg via ORAL
  Filled 2012-11-17: qty 1

## 2012-11-17 MED ORDER — LACTATED RINGERS IV BOLUS (SEPSIS)
1000.0000 mL | Freq: Once | INTRAVENOUS | Status: AC
  Administered 2012-11-17: 1000 mL via INTRAVENOUS

## 2012-11-17 MED ORDER — NIFEDIPINE 10 MG PO CAPS
10.0000 mg | ORAL_CAPSULE | ORAL | Status: DC | PRN
  Administered 2012-11-17 (×2): 10 mg via ORAL
  Filled 2012-11-17 (×2): qty 1
  Filled 2012-11-17: qty 2

## 2012-11-17 MED ORDER — ACETAMINOPHEN 325 MG PO TABS
650.0000 mg | ORAL_TABLET | ORAL | Status: DC | PRN
  Administered 2012-11-18: 650 mg via ORAL
  Filled 2012-11-17: qty 2

## 2012-11-17 NOTE — MAU Note (Signed)
"  I have been having BH UC's for the past 4 days.  My lower back is really hurting the worse.  I had preterm labor with my daughter the same way.  (+) FM.  No VB or LOF."

## 2012-11-17 NOTE — MAU Note (Signed)
Pt reports having braxton hicks contractions for the past 4 days. Stated they feel stronger and having constant back pain now.

## 2012-11-17 NOTE — MAU Provider Note (Signed)
Chief Complaint:  Contractions   First Provider Initiated Contact with Patient 11/17/12 1716      HPI: Christine Cervantes is a 27 y.o. [id number] at [redacted]w[redacted]d who presents to maternity admissions reporting worsening back pain and suprapubic pain concerning for contractions.  Pt states these began about two days ago and has had some lower back pain that reminded her of her previous pregnancy when she delivered preterm.  She is also having increased urinary frequency and urgency for the last two days but denies fever, chills, sweats, CVA tenderness.    Denies  leakage of fluid or vaginal bleeding, HA, blurred vision, diplopia, worsening LE edema, Nausea/Vomiting/Diarrhea/Recent Sick contacts. Good fetal movement.   Has been following in [facility] due to previous shortened cervix with preterm delivery.  Pt has not been to her appointment in about 1 1/2 months due to scheduling but previous to that had an US showing placenta non previa and also adequate cervix.     Past Medical History: Past Medical History  Diagnosis Date  . Chlamydia   . Trichimoniasis   . Gonorrhea   . Pregnancy induced hypertension   . Preterm labor   . Urinary tract infection   . Abnormal Pap smear     cryo on cervix  . Depression     Past obstetric history: OB History   Grav Para Term Preterm Abortions TAB SAB Ect Mult Living   4 2 1 1 1  1   2      # Outc Date GA Lbr Len/2nd Wgt Sex Del Anes PTL Lv   1 PRE 1/07 107w0d   M SVD EPI Yes Yes   Comments: elevated BP   2 TRM 10/10 [redacted]w[redacted]d   F SVD EPI No Yes   Comments: ? mass on fetal heart- no problems   3 SAB 2011           4 CUR               Past Surgical History: Past Surgical History  Procedure Laterality Date  . Foot surgery  2004-2004    corns removed from both feet, hammertoes repaired  . No past surgeries      Family History: Family History  Problem Relation Age of Onset  . Hypertension Mother   . Hypertension Maternal Aunt   . Hypertension Maternal  Grandmother   . Anesthesia problems Neg Hx   . Other Neg Hx   . Diabetes Paternal Grandmother     Social History: History  Substance Use Topics  . Smoking status: Never Smoker   . Smokeless tobacco: Never Used  . Alcohol Use: No     Comment: occ before (+) UPT    Allergies: No Known Allergies  Meds:  Prescriptions prior to admission  Medication Sig Dispense Refill  . Multiple Vitamins-Minerals (ADEKS) chewable tablet Chew 1 tablet by mouth daily.        ROS: Pertinent findings in history of present illness.  Physical Exam  Blood pressure 117/72, pulse 95, temperature 98.4 F (36.9 C), temperature source Oral, resp. rate 18, height 5\' 4"  (1.626 m), weight 84.46 kg (186 lb 3.2 oz), last menstrual period 03/11/2012. GENERAL: Well-developed, well-nourished female in no acute distress.  HEENT: normocephalic HEART: RRR RESP: CTA ABDOMEN: Soft,  TTP suprapubic, no CVA tenderness, no RUQ tenderness EXTREMITIES: Nontender, no edema NEURO: alert and oriented SPECULUM EXAM: NEFG, physiologic discharge, no blood, cervix clean    FHT:  Baseline 145 , moderate variability, accelerations present,  no decelerations Contractions: Irregular    Labs: Results for orders placed during the hospital encounter of 11/17/12 (from the past 24 hour(s))  URINALYSIS, ROUTINE W REFLEX MICROSCOPIC     Status: Abnormal   Collection Time    11/17/12  3:55 PM      Result Value Range   Color, Urine YELLOW  YELLOW   APPearance CLEAR  CLEAR   Specific Gravity, Urine 1.025  1.005 - 1.030   pH 6.0  5.0 - 8.0   Glucose, UA NEGATIVE  NEGATIVE mg/dL   Hgb urine dipstick NEGATIVE  NEGATIVE   Bilirubin Urine NEGATIVE  NEGATIVE   Ketones, ur 15 (*) NEGATIVE mg/dL   Protein, ur NEGATIVE  NEGATIVE mg/dL   Urobilinogen, UA 1.0  0.0 - 1.0 mg/dL   Nitrite NEGATIVE  NEGATIVE   Leukocytes, UA MODERATE (*) NEGATIVE  URINE MICROSCOPIC-ADD ON     Status: Abnormal   Collection Time    11/17/12  3:55 PM       Result Value Range   Squamous Epithelial / LPF MANY (*) RARE   WBC, UA 7-10  <3 WBC/hpf   Bacteria, UA RARE  RARE   Urine-Other MUCOUS PRESENT      Imaging:  No results found. MAU Course: 1) Wet Prep shows BV, will tx with Flagyl x 7 days, also will tx UTI with Keflex x 7 days.  2) Pt continues to have contractions, responded to Procardia 10 mg, will try another 10 mg.  If this works, will send home on Procardia 30 mg XL qd.    Assessment: 1. Supervision of other high-risk pregnancy   2. Previous pregnancy complicated by pregnancy-induced hypertension, antepartum   3. Marginal placenta previa     Plan: Please see Thressa Sheller, CNM note, pt signed off for A/P     Medication List    ASK your doctor about these medications       adeks chewable tablet  Chew 1 tablet by mouth daily.        Briscoe Deutscher, DO 11/17/2012 5:25 PM  I saw and examined patient along with student and agree with above note.   FRAZIER,NATALIE 11/22/2012 5:24 PM

## 2012-11-17 NOTE — H&P (Signed)
Chief Complaint: Contractions   First Provider Initiated Contact with Patient 11/17/12 1716  HPI: Christine Cervantes is a 27 y.o. [id number] at [redacted]w[redacted]d who presents to maternity admissions reporting worsening back pain and suprapubic pain concerning for contractions. Pt states these began about two days ago and has had some lower back pain that reminded her of her previous pregnancy when she delivered preterm. She is also having increased urinary frequency and urgency for the last two days but denies fever, chills, sweats, CVA tenderness.  Denies leakage of fluid or vaginal bleeding, HA, blurred vision, diplopia, worsening LE edema, Nausea/Vomiting/Diarrhea/Recent Sick contacts. Good fetal movement.  Has been following in [place] due to previous shortened cervix with preterm delivery. Pt has not been to her appointment in about 1 1/2 months due to scheduling but previous to that had an US showing placenta non previa and also adequate cervix.  Past Medical History:  Past Medical History   Diagnosis  Date   .  Chlamydia    .  Trichimoniasis    .  Gonorrhea    .  Pregnancy induced hypertension    .  Preterm labor    .  Urinary tract infection    .  Abnormal Pap smear      cryo on cervix   .  Depression     Past obstetric history:  OB History    Grav  Para  Term  Preterm  Abortions  TAB  SAB  Ect  Mult  Living    4  2  1  1  1   1    2       #  Outc  Date  GA  Lbr Len/2nd  Wgt  Sex  Del  Anes  PTL  Lv    1  PRE  1/07  102w0d    M  SVD  EPI  Yes  Yes    Comments: elevated BP    2  TRM  10/10  [redacted]w[redacted]d    F  SVD  EPI  No  Yes    Comments: ? mass on fetal heart- no problems    3  SAB  2011            4  CUR               Past Surgical History:  Past Surgical History   Procedure  Laterality  Date   .  Foot surgery   2004-2004     corns removed from both feet, hammertoes repaired   .  No past surgeries      Family History:  Family History   Problem  Relation  Age of Onset   .  Hypertension  Mother     .  Hypertension  Maternal Aunt    .  Hypertension  Maternal Grandmother    .  Anesthesia problems  Neg Hx    .  Other  Neg Hx    .  Diabetes  Paternal Grandmother     Social History:  History   Substance Use Topics   .  Smoking status:  Never Smoker   .  Smokeless tobacco:  Never Used   .  Alcohol Use:  No      Comment: occ before (+) UPT    Allergies: No Known Allergies  Meds:  Prescriptions prior to admission   Medication  Sig  Dispense  Refill   .  Multiple Vitamins-Minerals (ADEKS) chewable tablet  Chew 1 tablet  by mouth daily.      ROS: Pertinent findings in history of present illness.  Physical Exam   Blood pressure 117/72, pulse 95, temperature 98.4 F (36.9 C), temperature source Oral, resp. rate 18, height 5\' 4"  (1.626 m), weight 84.46 kg (186 lb 3.2 oz), last menstrual period 03/11/2012.  GENERAL: Well-developed, well-nourished female in no acute distress.  HEENT: normocephalic  HEART: RRR  RESP: CTA  ABDOMEN: Soft, TTP suprapubic, no CVA tenderness, no RUQ tenderness  EXTREMITIES: Nontender, no edema  NEURO: alert and oriented  SPECULUM EXAM: NEFG, physiologic discharge, no blood, cervix clean   FHT: Baseline 145 , moderate variability, accelerations present, no decelerations  Contractions: Irregular  Labs:  Results for orders placed during the hospital encounter of 11/17/12 (from the past 24 hour(s))   URINALYSIS, ROUTINE W REFLEX MICROSCOPIC Status: Abnormal    Collection Time    11/17/12 3:55 PM   Result  Value  Range    Color, Urine  YELLOW  YELLOW    APPearance  CLEAR  CLEAR    Specific Gravity, Urine  1.025  1.005 - 1.030    pH  6.0  5.0 - 8.0    Glucose, UA  NEGATIVE  NEGATIVE mg/dL    Hgb urine dipstick  NEGATIVE  NEGATIVE    Bilirubin Urine  NEGATIVE  NEGATIVE    Ketones, ur  15 (*)  NEGATIVE mg/dL    Protein, ur  NEGATIVE  NEGATIVE mg/dL    Urobilinogen, UA  1.0  0.0 - 1.0 mg/dL    Nitrite  NEGATIVE  NEGATIVE    Leukocytes, UA  MODERATE (*)   NEGATIVE   URINE MICROSCOPIC-ADD ON Status: Abnormal    Collection Time    11/17/12 3:55 PM   Result  Value  Range    Squamous Epithelial / LPF  MANY (*)  RARE    WBC, UA  7-10  <3 WBC/hpf    Bacteria, UA  RARE  RARE    Urine-Other  MUCOUS PRESENT     Imaging:  No results found.  MAU Course: 1) Wet Prep shows BV, will tx with Flagyl x 7 days, also will tx UTI with Keflex x 7 days. 2) Pt continues to have contractions, responded to Procardia 10 mg, will try another 10 mg. If this works, will send home on Procardia 30 mg XL qd.  Assessment:  1.  Supervision of other high-risk pregnancy   2.  Previous pregnancy complicated by pregnancy-induced hypertension, antepartum   3.  Marginal placenta previa    Plan: Admit to Antenatal BMZ x 2 Mag for CP prophylaxsis

## 2012-11-18 ENCOUNTER — Inpatient Hospital Stay (HOSPITAL_COMMUNITY): Payer: Medicaid Other

## 2012-11-18 ENCOUNTER — Encounter (HOSPITAL_COMMUNITY): Payer: Self-pay | Admitting: *Deleted

## 2012-11-18 DIAGNOSIS — O26879 Cervical shortening, unspecified trimester: Principal | ICD-10-CM

## 2012-11-18 DIAGNOSIS — O4700 False labor before 37 completed weeks of gestation, unspecified trimester: Secondary | ICD-10-CM

## 2012-11-18 LAB — RAPID URINE DRUG SCREEN, HOSP PERFORMED
Barbiturates: NOT DETECTED
Benzodiazepines: NOT DETECTED
Cocaine: NOT DETECTED
Tetrahydrocannabinol: NOT DETECTED

## 2012-11-18 LAB — URINE CULTURE: Colony Count: 8000

## 2012-11-18 LAB — CBC
HCT: 31.8 % — ABNORMAL LOW (ref 36.0–46.0)
Hemoglobin: 10.3 g/dL — ABNORMAL LOW (ref 12.0–15.0)
MCH: 27.1 pg (ref 26.0–34.0)
MCHC: 32.4 g/dL (ref 30.0–36.0)
RDW: 13.4 % (ref 11.5–15.5)

## 2012-11-18 LAB — TYPE AND SCREEN: ABO/RH(D): O POS

## 2012-11-18 MED ORDER — OXYCODONE-ACETAMINOPHEN 5-325 MG PO TABS: 1.0000 | ORAL_TABLET | Freq: Four times a day (QID) | ORAL | Status: DC | PRN

## 2012-11-18 MED ORDER — ONDANSETRON 8 MG PO TBDP
8.0000 mg | ORAL_TABLET | Freq: Three times a day (TID) | ORAL | Status: DC | PRN
  Administered 2012-11-18: 8 mg via ORAL
  Filled 2012-11-18: qty 1

## 2012-11-18 MED ORDER — LACTATED RINGERS IV SOLN
INTRAVENOUS | Status: DC
  Administered 2012-11-18 – 2012-11-19 (×3): via INTRAVENOUS

## 2012-11-18 MED ORDER — CYCLOBENZAPRINE HCL 10 MG PO TABS
10.0000 mg | ORAL_TABLET | Freq: Every day | ORAL | Status: DC
  Administered 2012-11-18 – 2012-11-20 (×2): 10 mg via ORAL
  Filled 2012-11-18 (×2): qty 1

## 2012-11-18 NOTE — Progress Notes (Signed)
Patient ID: Christine Cervantes, female   DOB: 1985-11-15, 27 y.o.   MRN: [id number] FACULTY PRACTICE ANTEPARTUM(COMPREHENSIVE) NOTE  Christine Cervantes is a 27 y.o. [id number] with Estimated Date of Delivery: 01/13/13  by  early ultrasound at [redacted]w[redacted]d  who is admitted for Preterm labor.    Fetal presentation is unsure. Length of Stay:  1  Days  Date of admission:11/17/2012  Subjective: Patient reports Christine Cervantes lower back pain. She reports good fetal movement, rare contractions, no leakage of fluid or vaginal bleeding Patient reports the fetal movement as active. Patient reports uterine contraction  activity as irregular. Patient reports  vaginal bleeding as none. Patient describes fluid per vagina as None.  Vitals:  Blood pressure 118/59, pulse 93, temperature 98.5 F (36.9 C), temperature source Oral, resp. rate 16, height 5\' 4"  (1.626 m), weight 186 lb 3.2 oz (84.46 kg), last menstrual period 03/11/2012. Filed Vitals:   11/18/12 0305 11/18/12 0402 11/18/12 0502 11/18/12 0602  BP: 100/47 112/69 107/70 118/59  Pulse: 86 90 100 93  Temp:      TempSrc:      Resp: 20 20 16 16   Height:      Weight:       Physical Examination:  General appearance - alert, well appearing, and in no distress Back exam - reproducible lower back pain Abd: Soft, gravid, NT Pelvic Exam:  examination not indicated Cervical Exam: Not evaluated. . Extremities: extremities normal, atraumatic, no cyanosis or edema and no edema, redness or tenderness in the calves or thighs with DTRs 2+ bilaterally Membranes:intact  Fetal Monitoring:  Baseline: 140 bpm, Variability: Good {> 6 bpm), Accelerations: Reactive, Decelerations: Variable: mild and Toco: uterine irritability     Labs:  Recent Results (from the past 24 hour(s))  URINALYSIS, ROUTINE W REFLEX MICROSCOPIC   Collection Time    11/17/12  3:55 PM      Result Value Range   Color, Urine YELLOW  YELLOW   APPearance CLEAR  CLEAR   Specific Gravity, Urine 1.025  1.005 - 1.030    pH 6.0  5.0 - 8.0   Glucose, UA NEGATIVE  NEGATIVE mg/dL   Hgb urine dipstick NEGATIVE  NEGATIVE   Bilirubin Urine NEGATIVE  NEGATIVE   Ketones, ur 15 (*) NEGATIVE mg/dL   Protein, ur NEGATIVE  NEGATIVE mg/dL   Urobilinogen, UA 1.0  0.0 - 1.0 mg/dL   Nitrite NEGATIVE  NEGATIVE   Leukocytes, UA MODERATE (*) NEGATIVE  URINE MICROSCOPIC-ADD ON   Collection Time    11/17/12  3:55 PM      Result Value Range   Squamous Epithelial / LPF MANY (*) RARE   WBC, UA 7-10  <3 WBC/hpf   Bacteria, UA RARE  RARE   Urine-Other MUCOUS PRESENT    WET PREP, GENITAL   Collection Time    11/17/12  5:35 PM      Result Value Range   Yeast Wet Prep HPF POC NONE SEEN  NONE SEEN   Trich, Wet Prep NONE SEEN  NONE SEEN   Clue Cells Wet Prep HPF POC MODERATE (*) NONE SEEN   WBC, Wet Prep HPF POC MODERATE (*) NONE SEEN    Imaging Studies:      Medications:  Scheduled . betamethasone acetate-betamethasone sodium phosphate  12 mg Intramuscular Q24H  . docusate sodium  100 mg Oral Daily  . metroNIDAZOLE  500 mg Oral BID  . prenatal multivitamin  1 tablet Oral Daily   I have reviewed the patient's current  medications.  ASSESSMENT: Patient Active Problem List  Diagnosis  . OBESITY  . depression  . THYROMEGALY  . HYPERTROPHY OF BREAST  . Fatigue  . Right knee pain  . Hyperemesis complicating pregnancy, antepartum  . Pregnancy examination or test, positive result  . Supervision of other high-risk pregnancy  . History of preterm delivery, currently pregnant  . Previous pregnancy complicated by pregnancy-induced hypertension, antepartum  . Marginal placenta previa  . Cervical shortening complicating pregnancy    PLAN: 27 yo [id number] at 32 weeks admitted with PTL - Continue tocolysis with magnesium sulfate - Patient to receive second dose of betamethasone this evening - Continue close monitoring and current antenatal care  [name] 11/18/2012,7:15 AM

## 2012-11-18 NOTE — H&P (Signed)
Attestation of Attending Supervision of Advanced Practitioner (CNM/NP): Evaluation and management procedures were performed by the Advanced Practitioner under my supervision and collaboration.  I have reviewed the Advanced Practitioner's note and chart, and I agree with the management and plan.  Jordis Repetto 11/18/2012 8:02 AM

## 2012-11-18 NOTE — Progress Notes (Signed)
Patient ID: Christine Cervantes, female   DOB: Aug 25, 1986, 27 y.o.   MRN: [id number] S: called to room pt c/o of intermittent lower back pain and nausea. +FM, denies lof or vb O: VSS, afebrile Toco: some UI, 1 UC in last hour No abdominal tenderness No CVA tenderness Cervix: unchanged from yesterday, 2/thick/high A/P: muscle spasm Flexeril 10mg  PO now Zofran 8mg  ODT

## 2012-11-18 NOTE — Progress Notes (Signed)
Ur chart review completed.  

## 2012-11-19 LAB — GC/CHLAMYDIA PROBE AMP
CT Probe RNA: NEGATIVE
GC Probe RNA: NEGATIVE

## 2012-11-19 MED ORDER — MAGNESIUM SULFATE 40 G IN LACTATED RINGERS - SIMPLE
1.0000 g/h | INTRAVENOUS | Status: DC
  Filled 2012-11-19: qty 500

## 2012-11-19 MED ORDER — NIFEDIPINE 10 MG PO CAPS
20.0000 mg | ORAL_CAPSULE | Freq: Four times a day (QID) | ORAL | Status: DC
  Administered 2012-11-19 – 2012-11-20 (×3): 20 mg via ORAL
  Filled 2012-11-19 (×2): qty 2

## 2012-11-19 MED ORDER — GUAIFENESIN 100 MG/5ML PO SOLN
200.0000 mg | Freq: Four times a day (QID) | ORAL | Status: DC | PRN
  Administered 2012-11-19: 200 mg via ORAL
  Administered 2012-11-20: 300 mg via ORAL
  Filled 2012-11-19 (×2): qty 15

## 2012-11-19 NOTE — Progress Notes (Signed)
Pt called out concerned about cough that has worsened through night.  Lungs clear.  Pt offered cough med.  MD notified of pt cough/lungs clear and order received

## 2012-11-19 NOTE — Progress Notes (Signed)
CSW scheduled pt a [facility] appointment for Tuesday, November 26, 2012 @ 3pm, upon pt's request.  Pt thanked CSW and did not identify other CSW needs at this time.  CSW signing off.

## 2012-11-19 NOTE — Progress Notes (Signed)
Patient ID: Christine Cervantes, female   DOB: 1986/07/31, 27 y.o.   MRN: [id number]  FACULTY PRACTICE ANTEPARTUM(COMPREHENSIVE) NOTE  Christine Cervantes is a 27 y.o. [id number] at 101w1d  who is admitted for Preterm labor.   Length of Stay:  2  Days  Subjective: None Patient reports the fetal movement as active. Patient reports uterine contraction  activity as rare--5 overnight. Patient reports  vaginal bleeding as none. Patient describes fluid per vagina as None.  Vitals:  Blood pressure 110/70, pulse 91, temperature 98.1 F (36.7 C), temperature source Oral, resp. rate 18, height 5\' 4"  (1.626 m), weight 186 lb 3.2 oz (84.46 kg), last menstrual period 03/11/2012. Physical Examination:  General appearance - alert, well appearing, and in no distress Abdomen:  Gravid, soft, non tender, non distended Extremities - no edema, redness or tenderness in the calves or thighs, Homan's sign negative bilaterally  Fetal Monitoring:  Baseline: 130 bpm, Variability: Good {> 6 bpm), Accelerations: Reactive and Decelerations: Absent  Labs:  Recent Results (from the past 24 hour(s))  CBC   Collection Time    11/18/12  8:35 AM      Result Value Range   WBC 8.3  4.0 - 10.5 K/uL   RBC 3.80 (*) 3.87 - 5.11 MIL/uL   Hemoglobin 10.3 (*) 12.0 - 15.0 g/dL   HCT 01.0 (*) 27.2 - 53.6 %   MCV 83.7  78.0 - 100.0 fL   MCH 27.1  26.0 - 34.0 pg   MCHC 32.4  30.0 - 36.0 g/dL   RDW 64.4  03.4 - 74.2 %   Platelets 187  150 - 400 K/uL  TYPE AND SCREEN   Collection Time    11/18/12  8:35 AM      Result Value Range   ABO/RH(D) O POS     Antibody Screen NEG     Sample Expiration 11/21/2012    GROUP B STREP BY PCR   Collection Time    11/18/12  9:55 AM      Result Value Range   Group B strep by PCR NEGATIVE  NEGATIVE  URINE RAPID DRUG SCREEN (HOSP PERFORMED)   Collection Time    11/18/12  9:56 AM      Result Value Range   Opiates RESULTS UNAVAILABLE DUE TO INTERFERING SUBSTANCE (*) NONE DETECTED   Cocaine NONE  DETECTED  NONE DETECTED   Benzodiazepines NONE DETECTED  NONE DETECTED   Amphetamines NONE DETECTED  NONE DETECTED   Tetrahydrocannabinol NONE DETECTED  NONE DETECTED   Barbiturates NONE DETECTED  NONE DETECTED    Imaging Studies:      Medications:  Scheduled . cyclobenzaprine  10 mg Oral QHS  . docusate sodium  100 mg Oral Daily  . metroNIDAZOLE  500 mg Oral BID  . prenatal multivitamin  1 tablet Oral Daily   I have reviewed the patient's current medications.  ASSESSMENT: Patient Active Problem List  Diagnosis  . OBESITY  . depression  . THYROMEGALY  . HYPERTROPHY OF BREAST  . Fatigue  . Right knee pain  . Hyperemesis complicating pregnancy, antepartum  . Pregnancy examination or test, positive result  . Supervision of other high-risk pregnancy  . History of preterm delivery, currently pregnant  . Previous pregnancy complicated by pregnancy-induced hypertension, antepartum  . Marginal placenta previa  . Cervical shortening complicating pregnancy    PLAN: 27 yo female with PTL Cervical exam stable (2/thick) Will seen magnesium sulfate.  Christine Levi H. 11/19/2012,6:55 AM

## 2012-11-20 DIAGNOSIS — O09219 Supervision of pregnancy with history of pre-term labor, unspecified trimester: Secondary | ICD-10-CM

## 2012-11-20 DIAGNOSIS — O4700 False labor before 37 completed weeks of gestation, unspecified trimester: Secondary | ICD-10-CM | POA: Diagnosis present

## 2012-11-20 MED ORDER — ZOLPIDEM TARTRATE 5 MG PO TABS: 5.0000 mg | ORAL_TABLET | Freq: Every evening | ORAL | Status: DC | PRN

## 2012-11-20 MED ORDER — CYCLOBENZAPRINE HCL 10 MG PO TABS: 10.0000 mg | ORAL_TABLET | Freq: Every day | ORAL | Status: DC

## 2012-11-20 MED ORDER — NIFEDIPINE ER 30 MG PO TB24: 30.0000 mg | ORAL_TABLET | Freq: Two times a day (BID) | ORAL | Status: DC

## 2012-11-20 MED ORDER — HYDROXYPROGESTERONE CAPROATE 250 MG/ML IM OIL
250.0000 mg | TOPICAL_OIL | Freq: Once | INTRAMUSCULAR | Status: AC
  Administered 2012-11-20: 250 mg via INTRAMUSCULAR
  Filled 2012-11-20: qty 1

## 2012-11-20 MED ORDER — OXYCODONE-ACETAMINOPHEN 5-325 MG PO TABS: 1.0000 | ORAL_TABLET | Freq: Four times a day (QID) | ORAL | Status: DC | PRN

## 2012-11-20 MED ORDER — NIFEDIPINE ER 30 MG PO TB24
30.0000 mg | ORAL_TABLET | Freq: Two times a day (BID) | ORAL | Status: DC
  Administered 2012-11-20: 30 mg via ORAL
  Filled 2012-11-20: qty 1

## 2012-11-20 NOTE — Consult Note (Signed)
[facility]  Neonatal Medicine Consultation       11/20/2012    12:07 AM  I was called at the request of the patient's obstetrician (Dr. Jolayne Panther) to speak to this patient due to premature labor and shortened cervix at [redacted] weeks gestation.  The patient was admitted on 2/9, and has been treated with betamethasone, magnesium sulfate, antibiotics (for bacterial vaginosis and UTI), and now Procardia.  I reviewed likely outcome for a baby born this early.  Overall prognosis is good.  I talked about survival, length of stay, respiratory distress, feeding, long-term development.  Mom wants to breast feed, which I encouraged.    _____________________ Electronically Signed By: Angelita Ingles, MD Neonatologist

## 2012-11-20 NOTE — Discharge Summary (Addendum)
Antenatal Physician Discharge Summary  Patient ID: Christine Cervantes MRN: [id number] DOB/AGE: 29-Feb-1987 29 y.o.  Admit date: 11/17/2012 Discharge date: 11/20/2012  Admission Diagnoses: Preterm Labor  Discharge Diagnoses: The same  Prenatal Procedures: NST and ultrasound  Consults: Neonatology  Significant Diagnostic Studies: Results for orders placed during the hospital encounter of 11/17/12 (from the past 72 hour(s))  URINALYSIS, ROUTINE W REFLEX MICROSCOPIC     Status: Abnormal   Collection Time    11/17/12  3:55 PM      Result Value Range   Color, Urine YELLOW  YELLOW   APPearance CLEAR  CLEAR   Specific Gravity, Urine 1.025  1.005 - 1.030   pH 6.0  5.0 - 8.0   Glucose, UA NEGATIVE  NEGATIVE mg/dL   Hgb urine dipstick NEGATIVE  NEGATIVE   Bilirubin Urine NEGATIVE  NEGATIVE   Ketones, ur 15 (*) NEGATIVE mg/dL   Protein, ur NEGATIVE  NEGATIVE mg/dL   Urobilinogen, UA 1.0  0.0 - 1.0 mg/dL   Nitrite NEGATIVE  NEGATIVE   Leukocytes, UA MODERATE (*) NEGATIVE  URINE MICROSCOPIC-ADD ON     Status: Abnormal   Collection Time    11/17/12  3:55 PM      Result Value Range   Squamous Epithelial / LPF MANY (*) RARE   WBC, UA 7-10  <3 WBC/hpf   Bacteria, UA RARE  RARE   Urine-Other MUCOUS PRESENT    URINE CULTURE     Status: None   Collection Time    11/17/12  3:55 PM      Result Value Range   Specimen Description URINE, CLEAN CATCH     Special Requests Normal     Culture  Setup Time 11/17/2012 22:52     Colony Count 8,000 COLONIES/ML     Culture INSIGNIFICANT GROWTH     Report Status 11/18/2012 FINAL    WET PREP, GENITAL     Status: Abnormal   Collection Time    11/17/12  5:35 PM      Result Value Range   Yeast Wet Prep HPF POC NONE SEEN  NONE SEEN   Trich, Wet Prep NONE SEEN  NONE SEEN   Clue Cells Wet Prep HPF POC MODERATE (*) NONE SEEN   WBC, Wet Prep HPF POC MODERATE (*) NONE SEEN   Comment: FEW BACTERIA SEEN  GC/CHLAMYDIA PROBE AMP     Status: None   Collection Time     11/17/12  5:35 PM      Result Value Range   CT Probe RNA NEGATIVE  NEGATIVE   GC Probe RNA NEGATIVE  NEGATIVE   Comment: (NOTE)                                                                            CBC     Status: Abnormal   Collection Time    11/18/12  8:35 AM      Result Value Range   WBC 8.3  4.0 - 10.5 K/uL   RBC 3.80 (*) 3.87 - 5.11 MIL/uL   Hemoglobin 10.3 (*) 12.0 - 15.0 g/dL   HCT 30.8 (*) 65.7 - 84.6 %   MCV 83.7  78.0 - 100.0 fL  MCH 27.1  26.0 - 34.0 pg   MCHC 32.4  30.0 - 36.0 g/dL   RDW 96.0  45.4 - 09.8 %   Platelets 187  150 - 400 K/uL  TYPE AND SCREEN     Status: None   Collection Time    11/18/12  8:35 AM      Result Value Range   ABO/RH(D) O POS     Antibody Screen NEG     Sample Expiration 11/21/2012    GROUP B STREP BY PCR     Status: None   Collection Time    11/18/12  9:55 AM      Result Value Range   Group B strep by PCR NEGATIVE  NEGATIVE       URINE RAPID DRUG SCREEN (HOSP PERFORMED)     Status: Abnormal   Collection Time    11/18/12  9:56 AM      Result Value Range   Opiates RESULTS UNAVAILABLE DUE TO INTERFERING SUBSTANCE (*) NONE DETECTED   Comment: SUGGEST RECOLLECT   Cocaine NONE DETECTED  NONE DETECTED   Benzodiazepines NONE DETECTED  NONE DETECTED   Amphetamines NONE DETECTED  NONE DETECTED   Tetrahydrocannabinol NONE DETECTED  NONE DETECTED   Barbiturates NONE DETECTED  NONE DETECTED             11/18/12 OB Ultrasound EFW 1699g/38%, AFI 17.3 cm, cephalic, posterior placenta, 1.8 cm cervical length with funneling at the internal os, funnel length 0.9 cm and width 1.5 cm.  Treatments: IV hydration, Betamethasone x 2 doses, Magnesium sulfate for neuroprotection and initial tocolysis which was transitioned to [facility] Course:  This is a 27 y.o. [id number] with IUP at [redacted]w[redacted]d admitted for concern about preterm labor. [facility] history of PTD and short cervix and receives weekly 17-P.  She was admitted  with contractions, noted to have a cervical exam of 2/thick/posterior.  No leaking of fluid and no bleeding.  She was initially started on magnesium sulfate for tocolysis and neuroprotection and also received betamethasone x 2 doses.  Her tocolysis was transitioned to Procardia. She was seen by Neonatology during her stay.  She was observed, fetal heart rate monitoring remained reassuring, and she had no signs/symptoms of progressing preterm labor or other maternal-fetal concerns.  Her cervical exam was unchanged from admission.  She was deemed stable for discharge to home with outpatient follow up.  Discharge Exam: Blood pressure 126/62, pulse 101, temperature 98.3 F (36.8 C), temperature source Oral, resp. rate 18, height 5\' 4"  (1.626 m), weight 186 lb 3.2 oz (84.46 kg), last menstrual period 03/11/2012. General appearance: alert and no distress GI: soft, non-tender; bowel sounds normal; no masses,  no organomegaly Pelvic: cervix normal in appearance, external genitalia normal and 2/thick/high  Discharge Condition: Stable  Disposition: 01-Home or Self Care     Medication List    TAKE these medications       adeks chewable tablet  Chew 1 tablet by mouth daily.     cyclobenzaprine 10 MG tablet  Commonly known as:  FLEXERIL  Take 1 tablet (10 mg total) by mouth at bedtime.     NIFEdipine 30 MG 24 hr tablet  Commonly known as:  PROCARDIA-XL/ADALAT CC  Take 1 tablet (30 mg total) by mouth 2 (two) times daily.     oxyCODONE-acetaminophen 5-325 MG per tablet  Commonly known as:  PERCOCET/ROXICET  Take 1-2 tablets by mouth every 6 (six) hours as needed.  zolpidem 5 MG tablet  Commonly known as:  AMBIEN  Take 1 tablet (5 mg total) by mouth at bedtime as needed for sleep.         SignedJaynie Collins A 11/20/2012, 3:22 AM   Addendum:  17P injection ordered and given in hospital prior to D/C 11/20/12.  Appointment in [facility] next week for prenatal visit and next injection.     Sharen Counter Certified Nurse-Midwife

## 2012-11-21 NOTE — Discharge Summary (Signed)
Attestation of Attending Supervision of Advanced Practitioner (PA/CNM/NP): Evaluation and management procedures were performed by the Advanced Practitioner under my supervision and collaboration.  I have reviewed the Advanced Practitioner's note and chart, and I agree with the management and plan.  Rory Xiang, MD, FACOG Attending Obstetrician & Gynecologist Faculty Practice, [facility]  

## 2012-11-25 ENCOUNTER — Encounter: Payer: Self-pay | Admitting: Obstetrics & Gynecology

## 2012-11-25 NOTE — MAU Provider Note (Signed)
Attestation of Attending Supervision of Advanced Practitioner (CNM/NP): Evaluation and management procedures were performed by the Advanced Practitioner under my supervision and collaboration.  I have reviewed the Advanced Practitioner's note and chart, and I agree with the management and plan.  Geovanni Rahming 11/25/2012 8:58 AM

## 2012-12-02 ENCOUNTER — Telehealth: Payer: Self-pay | Admitting: Obstetrics and Gynecology

## 2012-12-02 NOTE — Telephone Encounter (Signed)
Patient called stating that she needs a letter to relieve her from work. Advised patient that she would need to make sure to come for her next appointment this Thursda 02/14 @  1015 and seek clearance of relief from work from the provider at that time. Patient agrees and satisfied.

## 2012-12-05 ENCOUNTER — Other Ambulatory Visit: Payer: Self-pay | Admitting: Obstetrics & Gynecology

## 2012-12-05 ENCOUNTER — Encounter: Payer: Self-pay | Admitting: *Deleted

## 2012-12-05 ENCOUNTER — Ambulatory Visit (INDEPENDENT_AMBULATORY_CARE_PROVIDER_SITE_OTHER): Payer: Medicaid Other | Admitting: Obstetrics & Gynecology

## 2012-12-05 DIAGNOSIS — O4700 False labor before 37 completed weeks of gestation, unspecified trimester: Secondary | ICD-10-CM

## 2012-12-05 LAB — POCT URINALYSIS DIP (DEVICE)
Bilirubin Urine: NEGATIVE
Glucose, UA: NEGATIVE mg/dL
Specific Gravity, Urine: 1.02 (ref 1.005–1.030)
pH: 7 (ref 5.0–8.0)

## 2012-12-05 MED ORDER — HYDROXYPROGESTERONE CAPROATE 250 MG/ML IM OIL
250.0000 mg | TOPICAL_OIL | Freq: Once | INTRAMUSCULAR | Status: AC
  Administered 2012-12-05: 250 mg via INTRAMUSCULAR

## 2012-12-05 NOTE — Patient Instructions (Addendum)

## 2012-12-05 NOTE — Progress Notes (Signed)
Hospitalized 3 days early Feb. Will resume 17p at this time, D/C nifedipine, which she isn't taking

## 2012-12-06 ENCOUNTER — Inpatient Hospital Stay (HOSPITAL_COMMUNITY)
Admission: AD | Admit: 2012-12-06 | Discharge: 2012-12-06 | Disposition: A | Discharge status: Home / Self Care | Payer: Medicaid Other | Source: Ambulatory Visit | Attending: Obstetrics & Gynecology | Admitting: Obstetrics & Gynecology

## 2012-12-06 ENCOUNTER — Encounter (HOSPITAL_COMMUNITY): Payer: Self-pay | Admitting: *Deleted

## 2012-12-06 DIAGNOSIS — O4700 False labor before 37 completed weeks of gestation, unspecified trimester: Secondary | ICD-10-CM | POA: Insufficient documentation

## 2012-12-06 DIAGNOSIS — N949 Unspecified condition associated with female genital organs and menstrual cycle: Secondary | ICD-10-CM | POA: Insufficient documentation

## 2012-12-06 LAB — URINE MICROSCOPIC-ADD ON

## 2012-12-06 LAB — URINALYSIS, ROUTINE W REFLEX MICROSCOPIC
Bilirubin Urine: NEGATIVE
Glucose, UA: NEGATIVE mg/dL
Specific Gravity, Urine: 1.02 (ref 1.005–1.030)

## 2012-12-06 MED ORDER — LACTATED RINGERS IV BOLUS (SEPSIS)
1000.0000 mL | Freq: Once | INTRAVENOUS | Status: AC
  Administered 2012-12-06: 1000 mL via INTRAVENOUS

## 2012-12-06 MED ORDER — NIFEDIPINE ER 30 MG PO TB24: 30.0000 mg | ORAL_TABLET | Freq: Two times a day (BID) | ORAL | Status: DC

## 2012-12-06 MED ORDER — ONDANSETRON 8 MG PO TBDP
8.0000 mg | ORAL_TABLET | Freq: Once | ORAL | Status: AC
  Administered 2012-12-06: 8 mg via ORAL
  Filled 2012-12-06: qty 1

## 2012-12-06 MED ORDER — NIFEDIPINE ER OSMOTIC RELEASE 30 MG PO TB24
30.0000 mg | ORAL_TABLET | ORAL | Status: AC
  Administered 2012-12-06: 30 mg via ORAL
  Filled 2012-12-06: qty 1

## 2012-12-06 MED ORDER — NIFEDIPINE 10 MG PO CAPS
20.0000 mg | ORAL_CAPSULE | Freq: Three times a day (TID) | ORAL | Status: DC
  Administered 2012-12-06: 20 mg via ORAL
  Filled 2012-12-06: qty 2

## 2012-12-06 NOTE — MAU Provider Note (Signed)
History     CSN: [id number]  Arrival date and time: 12/06/12 1478   First Provider Initiated Contact with Patient 12/06/12 0309      No chief complaint on file.  HPI  27 year old F G4P1112 @ 34.[redacted] weeks EGA by early u/s who presents with complaints of pelvic pressure and vomiting x 1. This pelvic pressure started this morning after receiving a 17-P injection at [facility] clinic for her history of pre-term delivery at 36 weeks. She says it is mainly lower pelvic pressure and pain but is uncertain if truly having contractions. She denies vaginal bleeding or leakage of fluids. She has had decreased PO intake today. Her cervix was checked in clinic this morning and determined to be  3.5/50/-2.   OB History   Grav Para Term Preterm Abortions TAB SAB Ect Mult Living   4 2 1 1 1  1   2       Patient Active Problem List  Diagnosis  . OBESITY  . Depression  . Fatigue  . Hyperemesis complicating pregnancy, antepartum  . Supervision of other high-risk pregnancy  . History of preterm delivery, currently pregnant  . Previous pregnancy complicated by pregnancy-induced hypertension, antepartum  . Cervical shortening complicating pregnancy  . Threatened preterm labor, antepartum     Past Medical History  Diagnosis Date  . Chlamydia   . Trichimoniasis   . Gonorrhea   . Pregnancy induced hypertension   . Preterm labor   . Urinary tract infection   . Abnormal Pap smear     cryo on cervix  . Depression     Past Surgical History  Procedure Laterality Date  . Foot surgery  2004-2004    corns removed from both feet, hammertoes repaired  . Gynecologic cryosurgery      Family History  Problem Relation Age of Onset  . Hypertension Mother   . Hypertension Maternal Aunt   . Hypertension Maternal Grandmother   . Anesthesia problems Neg Hx   . Other Neg Hx   . Diabetes Paternal Grandmother     History  Substance Use Topics  . Smoking status: Never Smoker   . Smokeless tobacco:  Never Used  . Alcohol Use: No     Comment: occ before (+) UPT    Allergies: No Known Allergies  Prescriptions prior to admission  Medication Sig Dispense Refill  . hydroxyprogesterone caproate (DELALUTIN) 250 mg/mL OIL Inject 250 mg into the muscle once.      . Multiple Vitamins-Minerals (ADEKS) chewable tablet Chew 1 tablet by mouth daily.      . cyclobenzaprine (FLEXERIL) 10 MG tablet Take 1 tablet (10 mg total) by mouth at bedtime.  30 tablet  2  . oxyCODONE-acetaminophen (PERCOCET/ROXICET) 5-325 MG per tablet Take 1-2 tablets by mouth every 6 (six) hours as needed.  30 tablet  0  . zolpidem (AMBIEN) 5 MG tablet Take 1 tablet (5 mg total) by mouth at bedtime as needed for sleep.  30 tablet  1    ROS Physical Exam   Blood pressure 120/78, pulse 94, temperature 98.4 F (36.9 C), temperature source Oral, resp. rate 20, height 5\' 4"  (1.626 m), weight 86.183 kg (190 lb), last menstrual period 03/11/2012.  Physical Exam  Constitutional: She appears well-developed and well-nourished. No distress.  HENT:  Head: Normocephalic and atraumatic.  Mouth/Throat: Oropharynx is clear and moist.  Neck: Normal range of motion. Neck supple.  Cardiovascular: Normal rate, regular rhythm, normal heart sounds and intact distal pulses.  Respiratory: Effort normal and breath sounds normal.  GI: There is no tenderness.  Gravid  Genitourinary: There is no lesion on the right labia. There is no lesion on the left labia. No bleeding around the vagina.  Clitoral piercing   Musculoskeletal: She exhibits no edema.  Neurological: She is alert.  Skin: Skin is warm and dry.  Psychiatric: She has a normal mood and affect. Her behavior is normal. Thought content normal.   @ 3:15 Dilation: 4 Station: -3 Exam by:: Dr Clinton Sawyer  @ 4:30 Dilation: 4 Effacement (%): 50 Cervical Position: Posterior Station: -3 Presentation: Vertex Exam by:: Dr Clinton Sawyer   MAU Course  Procedures  Give 1L LR bolus,  given zofran and procardia  MDM FHM - baseline 140, moderate variability Toco - irregular contractions q 15 min  Assessment and Plan  27 year old F G4P1112 @ 34.[redacted] weeks EGA who presents with false labor. Her cervical exam was stable from previous day in clinic and membranes were intact. Patient given rx for Procardia XL and indications to return. Otherwise, she will follow up at high risk clinic.   Obrien Huskins 12/06/2012, 3:20 AM

## 2012-12-06 NOTE — MAU Note (Signed)
PT SAYS  SHE  WENT TO [facility] TODAY-   VE -3-4 CM- GAVE 17 P SHOT.  WENT TO  BED AT HOME -  AWOKE AT 0130- VOMITNG  WITH WORSE PRESSURE IN VAG AREA,.  HAS LOWER  BACK PAIN- COLLECTED UA IN CLINIC-  NOTHING SAID.

## 2012-12-08 NOTE — MAU Provider Note (Signed)
I saw and examined patient and reviewed prenatal records, labs, imaging and FHTs. I checked her cervix prior to discharge and found it unchanged from prior checks (3.5 cm). I agree with above resident note and discharge. Discussed labor precautions with patient. Napoleon Form, MD

## 2012-12-12 ENCOUNTER — Observation Stay (HOSPITAL_COMMUNITY)
Admission: AD | Admit: 2012-12-12 | Discharge: 2012-12-12 | Disposition: A | Discharge status: Home / Self Care | Payer: Medicaid Other | Source: Ambulatory Visit | Attending: Obstetrics & Gynecology | Admitting: Obstetrics & Gynecology

## 2012-12-12 ENCOUNTER — Ambulatory Visit (INDEPENDENT_AMBULATORY_CARE_PROVIDER_SITE_OTHER): Payer: Medicaid Other | Admitting: Obstetrics & Gynecology

## 2012-12-12 ENCOUNTER — Encounter (HOSPITAL_COMMUNITY): Payer: Self-pay | Admitting: *Deleted

## 2012-12-12 ENCOUNTER — Other Ambulatory Visit (HOSPITAL_COMMUNITY)
Admission: RE | Admit: 2012-12-12 | Discharge: 2012-12-12 | Disposition: A | Discharge status: Home / Self Care | Payer: Medicaid Other | Source: Ambulatory Visit | Attending: Obstetrics & Gynecology | Admitting: Obstetrics & Gynecology

## 2012-12-12 DIAGNOSIS — Z113 Encounter for screening for infections with a predominantly sexual mode of transmission: Secondary | ICD-10-CM | POA: Insufficient documentation

## 2012-12-12 DIAGNOSIS — O4700 False labor before 37 completed weeks of gestation, unspecified trimester: Principal | ICD-10-CM | POA: Insufficient documentation

## 2012-12-12 DIAGNOSIS — O09893 Supervision of other high risk pregnancies, third trimester: Secondary | ICD-10-CM

## 2012-12-12 LAB — CBC
Hemoglobin: 10.7 g/dL — ABNORMAL LOW (ref 12.0–15.0)
MCH: 26.8 pg (ref 26.0–34.0)
MCV: 83.3 fL (ref 78.0–100.0)
RBC: 4 MIL/uL (ref 3.87–5.11)

## 2012-12-12 LAB — RPR: RPR Ser Ql: NONREACTIVE

## 2012-12-12 LAB — OB RESULTS CONSOLE GBS: GBS: NEGATIVE

## 2012-12-12 LAB — POCT URINALYSIS DIP (DEVICE)
Bilirubin Urine: NEGATIVE
Glucose, UA: NEGATIVE mg/dL
Nitrite: NEGATIVE

## 2012-12-12 MED ORDER — LACTATED RINGERS IV SOLN: INTRAVENOUS | Status: DC

## 2012-12-12 MED ORDER — OXYTOCIN BOLUS FROM INFUSION: 500.0000 mL | INTRAVENOUS | Status: DC

## 2012-12-12 MED ORDER — ACETAMINOPHEN 325 MG PO TABS: 650.0000 mg | ORAL_TABLET | ORAL | Status: DC | PRN

## 2012-12-12 MED ORDER — LIDOCAINE HCL (PF) 1 % IJ SOLN: 30.0000 mL | INTRAMUSCULAR | Status: DC | PRN

## 2012-12-12 MED ORDER — OXYTOCIN 40 UNITS IN LACTATED RINGERS INFUSION - SIMPLE MED: 62.5000 mL/h | INTRAVENOUS | Status: DC

## 2012-12-12 MED ORDER — ONDANSETRON HCL 4 MG/2ML IJ SOLN: 4.0000 mg | Freq: Four times a day (QID) | INTRAMUSCULAR | Status: DC | PRN

## 2012-12-12 MED ORDER — OXYCODONE-ACETAMINOPHEN 5-325 MG PO TABS: 1.0000 | ORAL_TABLET | ORAL | Status: DC | PRN

## 2012-12-12 MED ORDER — CITRIC ACID-SODIUM CITRATE 334-500 MG/5ML PO SOLN: 30.0000 mL | ORAL | Status: DC | PRN

## 2012-12-12 MED ORDER — IBUPROFEN 600 MG PO TABS: 600.0000 mg | ORAL_TABLET | Freq: Four times a day (QID) | ORAL | Status: DC | PRN

## 2012-12-12 MED ORDER — LACTATED RINGERS IV SOLN: 500.0000 mL | INTRAVENOUS | Status: DC | PRN

## 2012-12-12 NOTE — Progress Notes (Signed)
Pt having contractions.  5+ cm dilation.  Will send to L&D for obs.  Cultures done today.

## 2012-12-12 NOTE — Progress Notes (Signed)
Pulse- 97  Edema-feet  Pain/pressure- Lower back, vaginal Vaginal discharge- clear Pt states may have lost mucous plug and went to MAU last Thursday

## 2012-12-12 NOTE — Discharge Summary (Signed)
Pt s/p prolonged fetal monitoring with no cervical changes.  I spoke with pt who feels comfortable going home.  She expresses understanding of when to follow up.  Agree with above Midwife note.  Fermin Yan L. Harraway-Smith, M.D., Evern Core

## 2012-12-12 NOTE — Discharge Summary (Signed)
  Obstetric Discharge Summary Reason for Admission: threatened PTL  Prenatal Procedures: ultrasound and betamethasone   H/H: Lab Results  Component Value Date/Time   HGB 10.7* 12/12/2012 11:34 AM   HGB 12.2 11/07/2011  2:18 PM   HCT 33.3* 12/12/2012 11:34 AM      Discharge Diagnoses: False labor-undelivered [id number] at [redacted]w[redacted]d Preterm contractions without cervical change Category 1 FHR  Discharge Information: Date: 04/20/2011 Activity: pelvic rest Diet: routine Medications: PNV Declines Ambien Condition: stable Instructions: Rest as much as possible. Retrun for increased contractions or SROM, bleeding, decreased FM Discharge to: home   Bernal Luhman 12/12/2012,3:33 PM

## 2012-12-12 NOTE — Progress Notes (Signed)
Pt refuses 17-P today.  Feels contractions--cergvix is--

## 2012-12-12 NOTE — Progress Notes (Signed)
Christine Cervantes is a 27 y.o. [id number] at [redacted]w[redacted]d admitted from routine office visit due to preterm cervical dilation Subjective: Uncomfortable UCs for days. Had mucusy discharge prior to PNV today. Cx. 4/60/-2. (Had been 3cm 1 wk before).  S/P BMZ 3 wks ago. No LOF or VB. Good FM. Cx 5/70 per RN 1-2 hr ago. UCs still uncomfortable, but not significantly different. Has had IVFs.  Lives 10 min from [facility].   Objective: BP 114/53  Pulse 91  Temp(Src) 98.7 F (37.1 C) (Oral)  Resp 18  Ht 5\' 4"  (1.626 m)  Wt 84.823 kg (187 lb)  BMI 32.08 kg/m2  LMP 03/11/2012      FHT: 140 FHR: 140 bpm, variability: moderate,  accelerations:  Present,  decelerations:  Absent UC:   irregular, every 5-7 minutes, UI SVE:   Dilation: 4 Effacement (%): 60 Station: -2 Exam by:: Diedre Poe, cnm Cx mid to posterior, soft 4-5/60/-2 vtx intact  Labs: Lab Results  Component Value Date   WBC 6.6 12/12/2012   HGB 10.7* 12/12/2012   HCT 33.3* 12/12/2012   MCV 83.3 12/12/2012   PLT 190 12/12/2012    Assessment / Plan: Multiparous cx, unchanged after 7 hrs obs and IVF  hydration [redacted]w[redacted]d   Labor: not in estsablished labor. Prodromal contractions.  Fetal Wellbeing:  Category I Pain Control:  coping with discomfort I/D:  n/a Anticipated MOD:  NSVD. S/Sx labor reviewed at length Follow-up Information   Follow up with [facility]. Schedule an appointment as soon as possible for a visit in 1 week.   Contact information:   [address] [place] [address] [phone]      POE,DEIRDRE 12/12/2012, 3:38 PM

## 2012-12-12 NOTE — H&P (Signed)
Christine Cervantes is a 27 y.o. female presenting for contractions. Maternal Medical History:  Reason for admission: Contractions.  Nausea.  Contractions: Onset was 1 week ago.   Frequency: rare.   Duration is approximately 30 seconds.   Perceived severity is moderate.    Fetal activity: Perceived fetal activity is normal.    Prenatal complications: Preterm labor (hx of ).   No bleeding, hypertension or substance abuse.     OB History   Grav Para Term Preterm Abortions TAB SAB Ect Mult Living   4 2 1 1 1  1   2      Past Medical History  Diagnosis Date  . Chlamydia   . Trichimoniasis   . Gonorrhea   . Pregnancy induced hypertension   . Preterm labor   . Urinary tract infection   . Abnormal Pap smear     cryo on cervix  . Depression    Past Surgical History  Procedure Laterality Date  . Foot surgery  2004-2004    corns removed from both feet, hammertoes repaired  . Gynecologic cryosurgery     Family History: family history includes Diabetes in her paternal grandmother and Hypertension in her maternal aunt, maternal grandmother, and mother.  There is no history of Anesthesia problems and Other. Social History:  reports that she has never smoked. She has never used smokeless tobacco. She reports that she does not drink alcohol or use illicit drugs.   Prenatal Transfer Tool  Maternal Diabetes: No Genetic Screening: Declined Maternal Ultrasounds/Referrals: Normal Fetal Ultrasounds or other Referrals:  None Maternal Substance Abuse:  No Significant Maternal Medications:  None Significant Maternal Lab Results:  Lab values include: Other: Rubella  Other Comments:  None  Review of Systems  Constitutional: Negative for fever and chills.  HENT: Negative for congestion and sore throat.   Eyes: Positive for blurred vision (last week w/ spontaneious resolution). Negative for pain.  Respiratory: Negative for sputum production and wheezing.   Cardiovascular: Negative for chest  pain, palpitations, claudication and leg swelling.  Gastrointestinal: Negative for heartburn, nausea, vomiting, abdominal pain, diarrhea and constipation.  Genitourinary: Negative for dysuria, urgency, frequency and hematuria.  Musculoskeletal: Positive for back pain. Negative for myalgias and falls.  Skin: Negative for itching and rash.  Neurological: Negative for dizziness, tingling and headaches.  Endo/Heme/Allergies: Does not bruise/bleed easily.  Psychiatric/Behavioral: Negative for depression.  All other systems reviewed and are negative.      Blood pressure 122/75, pulse 93, temperature 98.7 F (37.1 C), temperature source Oral, resp. rate 20, height 5\' 4"  (1.626 m), weight 84.823 kg (187 lb), last menstrual period 03/11/2012. Maternal Exam:  Uterine Assessment: Contraction strength is mild.  Introitus: not evaluated.   Ferning test: not done.  Nitrazine test: not done. Amniotic fluid character: not assessed.  Cervix: not evaluated.   Fetal Exam Fetal Monitor Review: Mode: ultrasound.   Baseline rate: 150.  Variability: moderate (6-25 bpm).   Pattern: accelerations present.    Fetal State Assessment: Category I - tracings are normal.     Physical Exam  Constitutional: She is oriented to person, place, and time. She appears well-developed and well-nourished.  HENT:  Head: Normocephalic and atraumatic.  Eyes: Conjunctivae and EOM are normal. Pupils are equal, round, and reactive to light.  Neck: Normal range of motion. Neck supple.  Cardiovascular: Normal rate, regular rhythm, normal heart sounds and intact distal pulses.   Respiratory: Effort normal and breath sounds normal.  GI: Soft. Bowel sounds are normal.  There is no tenderness. There is no rebound, no guarding and no CVA tenderness.  Genitourinary:  deferred  Musculoskeletal: Normal range of motion.  Neurological: She is alert and oriented to person, place, and time. She has normal reflexes.  Reflex  Scores:      Bicep reflexes are 2+ on the right side and 2+ on the left side.      Achilles reflexes are 2+ on the right side and 2+ on the left side. Skin: Skin is warm and dry.  Psychiatric: She has a normal mood and affect. Her behavior is normal. Judgment and thought content normal.    Prenatal labs: ABO, Rh: --/--/O POS (02/10 1610) Antibody: NEG (02/10 0835) Rubella: 172.4 (11/27 1007) RPR: NON REAC (11/27 1007)  HBsAg: NEGATIVE (11/27 1007)  HIV: NON REACTIVE (11/27 1007)  GBS:   Unknown  U/S: 11/18/12: Impression: GA at [redacted]w[redacted]d.  EFW at 38%ile.  No late developing fetal abnormalities on visualized anatomy.  AFI 17.3 cm.  Shortened cervix at 1.8 cm in length.   Assessment/Plan: 27 y/o [id number] at [redacted]w[redacted]d presenting from the clinic with contractions  # Contractions:  -- admit for observation -- evaluation for labor progression -- routine care per floor protocol -- Rapid GBS 2/2 presenting with contractions at 35 weeks and hx of preterm delivery w/ 1st pregnancy  DISPO: Diet: thin liquids  Activity: up ab lib Nursing: Per floor protocol Electrolytes: replace as needed Prophylaxis: GI: none; DVT up ab lib Code: Full  Ancipitate the pt to be admitted for 2 midnight(s) for contractions and anticipated NSVD.     Gregor Hams 12/12/2012, 12:03 PM Evaluation and management procedures were performed by Resident physician under my supervision/collaboration. Chart reviewed, patient examined by me and I agree with management and plan.

## 2012-12-14 ENCOUNTER — Inpatient Hospital Stay (HOSPITAL_COMMUNITY)
Admission: AD | Admit: 2012-12-14 | Discharge: 2012-12-14 | Disposition: A | Discharge status: Home / Self Care | Payer: Medicaid Other | Source: Ambulatory Visit | Attending: Obstetrics and Gynecology | Admitting: Obstetrics and Gynecology

## 2012-12-14 ENCOUNTER — Encounter (HOSPITAL_COMMUNITY): Payer: Self-pay

## 2012-12-14 DIAGNOSIS — O09893 Supervision of other high risk pregnancies, third trimester: Secondary | ICD-10-CM

## 2012-12-14 DIAGNOSIS — O479 False labor, unspecified: Secondary | ICD-10-CM

## 2012-12-14 DIAGNOSIS — R109 Unspecified abdominal pain: Secondary | ICD-10-CM | POA: Insufficient documentation

## 2012-12-14 DIAGNOSIS — O4700 False labor before 37 completed weeks of gestation, unspecified trimester: Secondary | ICD-10-CM

## 2012-12-14 DIAGNOSIS — N949 Unspecified condition associated with female genital organs and menstrual cycle: Secondary | ICD-10-CM | POA: Insufficient documentation

## 2012-12-14 LAB — URINALYSIS, ROUTINE W REFLEX MICROSCOPIC
Hgb urine dipstick: NEGATIVE
Nitrite: NEGATIVE
Specific Gravity, Urine: <1.005 — ABNORMAL LOW (ref 1.005–1.030)
Urobilinogen, UA: 0.2 mg/dL (ref 0.0–1.0)

## 2012-12-14 LAB — URINE MICROSCOPIC-ADD ON

## 2012-12-14 MED ORDER — OXYCODONE-ACETAMINOPHEN 5-325 MG PO TABS
2.0000 | ORAL_TABLET | Freq: Once | ORAL | Status: AC
  Administered 2012-12-14: 2 via ORAL
  Filled 2012-12-14: qty 2

## 2012-12-14 NOTE — MAU Note (Signed)
Patient has been having contractions since Wednesday was told she was 4 cm, had mucus discharge.

## 2012-12-14 NOTE — MAU Provider Note (Signed)
  History     CSN: [id number]  Arrival date and time: 12/14/12 1527   First Provider Initiated Contact with Patient 12/14/12 1607      Chief Complaint  Patient presents with  . Contractions  . Vaginal Discharge   HPI Christine Cervantes is a 27yo [id number] at 35.5wks who presents for eval of upper abd tightening. Denies leak or bldg. Was observed for an extended period of time on 3/6 when cx remained 4+/70% during that time.   OB History   Grav Para Term Preterm Abortions TAB SAB Ect Mult Living   4 2 1 1 1  1   2       Past Medical History  Diagnosis Date  . Chlamydia   . Trichimoniasis   . Gonorrhea   . Pregnancy induced hypertension   . Preterm labor   . Urinary tract infection   . Abnormal Pap smear     cryo on cervix  . Depression     Past Surgical History  Procedure Laterality Date  . Foot surgery  2004-2004    corns removed from both feet, hammertoes repaired  . Gynecologic cryosurgery      Family History  Problem Relation Age of Onset  . Hypertension Mother   . Hypertension Maternal Aunt   . Hypertension Maternal Grandmother   . Anesthesia problems Neg Hx   . Other Neg Hx   . Diabetes Paternal Grandmother     History  Substance Use Topics  . Smoking status: Never Smoker   . Smokeless tobacco: Never Used  . Alcohol Use: No     Comment: occ before (+) UPT    Allergies: No Known Allergies  Prescriptions prior to admission  Medication Sig Dispense Refill  . flintstones complete (FLINTSTONES) 60 MG chewable tablet Chew 2 tablets by mouth daily.      . Pseudoeph-Doxylamine-DM-APAP (NYQUIL) 60-7.03-07-999 MG/30ML LIQD Take 30 mLs by mouth daily as needed (cold symptoms).        ROS Physical Exam   Blood pressure 113/71, pulse 98, temperature 98.3 F (36.8 C), temperature source Oral, resp. rate 18, height 5\' 4"  (1.626 m), weight 188 lb 12.8 oz (85.639 kg), last menstrual period 03/11/2012.  Physical Exam  Constitutional: She is oriented to person, place,  and time. She appears well-developed.  HENT:  Head: Normocephalic.  Cardiovascular: Normal rate.   Respiratory: Effort normal.  Genitourinary: Vagina normal.  Cx 4/60%/-2 very post  Musculoskeletal: Normal range of motion.  Neurological: She is alert and oriented to person, place, and time.  Skin: Skin is warm and dry.  Psychiatric: She has a normal mood and affect. Her behavior is normal. Thought content normal.  FHR 140s +accels, no decels No appreciable ctx pattern per toco  MAU Course  Procedures   Assessment and Plan  IUP at 35.5wks Braxton Hicks ctx  Given Percocet x 2 here D/C home with labor precautions F/U at [facility] this week if no labor  Cam Hai 12/14/2012, 4:18 PM

## 2012-12-15 LAB — CULTURE, BETA STREP (GROUP B ONLY)

## 2012-12-16 ENCOUNTER — Encounter: Payer: Self-pay | Admitting: Obstetrics & Gynecology

## 2012-12-16 NOTE — MAU Provider Note (Signed)
Attestation of Attending Supervision of Advanced Practitioner (CNM/NP): Evaluation and management procedures were performed by the Advanced Practitioner under my supervision and collaboration.  I have reviewed the Advanced Practitioner's note and chart, and I agree with the management and plan.  Jahnasia Tatum 12/16/2012 12:52 PM   

## 2012-12-16 NOTE — H&P (Signed)
Attestation of Attending Supervision of Advanced Practitioner (CNM/NP): Evaluation and management procedures were performed by the Advanced Practitioner under my supervision and collaboration.  I have reviewed the Advanced Practitioner's note and chart, and I agree with the management and plan.  HARRAWAY-SMITH, CAROLYN 10:51 AM     

## 2012-12-19 ENCOUNTER — Ambulatory Visit (INDEPENDENT_AMBULATORY_CARE_PROVIDER_SITE_OTHER): Payer: Medicaid Other | Admitting: Obstetrics & Gynecology

## 2012-12-19 ENCOUNTER — Other Ambulatory Visit: Payer: Self-pay | Admitting: Obstetrics & Gynecology

## 2012-12-19 VITALS — BP 117/74 | Temp 97.7°F | Wt 190.0 lb

## 2012-12-19 DIAGNOSIS — O09899 Supervision of other high risk pregnancies, unspecified trimester: Secondary | ICD-10-CM

## 2012-12-19 DIAGNOSIS — O09219 Supervision of pregnancy with history of pre-term labor, unspecified trimester: Secondary | ICD-10-CM

## 2012-12-19 LAB — POCT URINALYSIS DIP (DEVICE)
Glucose, UA: NEGATIVE mg/dL
Leukocytes, UA: NEGATIVE
Nitrite: NEGATIVE

## 2012-12-19 NOTE — Patient Instructions (Signed)
Return to clinic for any obstetric concerns or go to MAU for evaluation  

## 2012-12-19 NOTE — Progress Notes (Signed)
Pulse- 86  Edema-ankles  Pain/pressure- stomach/vaginal area  Vaginal discharge-clear

## 2012-12-19 NOTE — Progress Notes (Signed)
Refuses last 17-P today.  Unchanged cervix.  Negative GBS. No other complaints or concerns.  Fetal movement and labor precautions reviewed.

## 2012-12-23 ENCOUNTER — Encounter (HOSPITAL_COMMUNITY): Payer: Self-pay | Admitting: *Deleted

## 2012-12-23 ENCOUNTER — Inpatient Hospital Stay (HOSPITAL_COMMUNITY)
Admission: AD | Admit: 2012-12-23 | Discharge: 2012-12-23 | Disposition: A | Discharge status: Home / Self Care | Payer: Medicaid Other | Source: Ambulatory Visit | Attending: Obstetrics & Gynecology | Admitting: Obstetrics & Gynecology

## 2012-12-23 DIAGNOSIS — O09893 Supervision of other high risk pregnancies, third trimester: Secondary | ICD-10-CM

## 2012-12-23 DIAGNOSIS — N949 Unspecified condition associated with female genital organs and menstrual cycle: Secondary | ICD-10-CM | POA: Insufficient documentation

## 2012-12-23 DIAGNOSIS — O212 Late vomiting of pregnancy: Secondary | ICD-10-CM | POA: Insufficient documentation

## 2012-12-23 DIAGNOSIS — O479 False labor, unspecified: Secondary | ICD-10-CM | POA: Insufficient documentation

## 2012-12-23 DIAGNOSIS — O4700 False labor before 37 completed weeks of gestation, unspecified trimester: Secondary | ICD-10-CM

## 2012-12-23 MED ORDER — ONDANSETRON 4 MG PO TBDP
4.0000 mg | ORAL_TABLET | Freq: Once | ORAL | Status: AC
  Administered 2012-12-23: 4 mg via ORAL
  Filled 2012-12-23: qty 1

## 2012-12-23 MED ORDER — LACTATED RINGERS IV SOLN
INTRAVENOUS | Status: AC
  Filled 2012-12-23: qty 1000

## 2012-12-23 MED ORDER — NALBUPHINE HCL 10 MG/ML IJ SOLN
5.0000 mg | Freq: Once | INTRAMUSCULAR | Status: AC
  Administered 2012-12-23: 5 mg via INTRAVENOUS
  Filled 2012-12-23: qty 0.5

## 2012-12-23 MED ORDER — ONDANSETRON HCL 4 MG PO TABS
4.0000 mg | ORAL_TABLET | Freq: Once | ORAL | Status: AC
  Administered 2012-12-23: 4 mg via ORAL
  Filled 2012-12-23: qty 1

## 2012-12-23 MED ORDER — LACTATED RINGERS IV BOLUS (SEPSIS)
1000.0000 mL | INTRAVENOUS | Status: DC
  Administered 2012-12-23: 1000 mL via INTRAVENOUS

## 2012-12-23 MED ORDER — LACTATED RINGERS IV BOLUS (SEPSIS)
1000.0000 mL | Freq: Once | INTRAVENOUS | Status: AC
  Administered 2012-12-23: 1000 mL via INTRAVENOUS

## 2012-12-23 MED ORDER — ONDANSETRON 4 MG PO TBDP: 4.0000 mg | ORAL_TABLET | Freq: Three times a day (TID) | ORAL | Status: DC | PRN

## 2012-12-23 NOTE — MAU Note (Signed)
Patient is in with c/o intense pressure and vomiting since yesterday. She denies frequent contractions, vaginal bleeding or lof. She reports good fetal movement. She states that she was 5cm yesterday last week at the [facility].

## 2012-12-23 NOTE — MAU Provider Note (Signed)
History     CSN: [id number]  Arrival date and time: 12/23/12 1045   First Provider Initiated Contact with Patient 12/23/12 1109      Chief Complaint  Patient presents with  . Abdominal Pain  . Emesis   HPI 27 y/o [id number] at 37.0 here with vaginal pressure, nausea, and vomiting, she has been having irregular contractions for a few weeks but they became more severe with the onset of her nausea and vomiting 3-4 days ago. She has vomited after eating anything for the last 3-4 days, She states it is non-bloody/non-billous. Her contractions range from 5-9/10 and  have begun to get more severe and regular now at 8-10 minutes apart. She denies vaginal LOF/dc/bleeding/irritation, and decreased fetal movement,. She has had some blurry vision and occasional RUQ pain but neither today. She has a Hx of PTD and PIH in prevoious pregnancies She denies dyspnea, chest pain, headache, headache, and dysuria.    OB History   Grav Para Term Preterm Abortions TAB SAB Ect Mult Living   4 2 1 1 1  1   2       Past Medical History  Diagnosis Date  . Chlamydia   . Trichimoniasis   . Gonorrhea   . Pregnancy induced hypertension   . Preterm labor   . Urinary tract infection   . Abnormal Pap smear     cryo on cervix  . Depression     Past Surgical History  Procedure Laterality Date  . Foot surgery  2004-2004    corns removed from both feet, hammertoes repaired  . Gynecologic cryosurgery      Family History  Problem Relation Age of Onset  . Hypertension Mother   . Hypertension Maternal Aunt   . Hypertension Maternal Grandmother   . Anesthesia problems Neg Hx   . Other Neg Hx   . Diabetes Paternal Grandmother     History  Substance Use Topics  . Smoking status: Never Smoker   . Smokeless tobacco: Never Used  . Alcohol Use: No     Comment: occ before (+) UPT    Allergies: No Known Allergies  Prescriptions prior to admission  Medication Sig Dispense Refill  . flintstones complete  (FLINTSTONES) 60 MG chewable tablet Chew 2 tablets by mouth daily.      . Pseudoeph-Doxylamine-DM-APAP (NYQUIL) 60-7.03-07-999 MG/30ML LIQD Take 30 mLs by mouth daily as needed (cold symptoms).        ROS Per hpI Physical Exam   Blood pressure 133/84, pulse 88, temperature 97 F (36.1 C), temperature source Oral, resp. rate 20, height 5\' 4"  (1.626 m), weight 86.637 kg (191 lb), last menstrual period 03/11/2012.  Physical Exam  Gen: NAD, alert, cooperative with exam HEENT: NCAT, EOMI, PERRL CV: RRR, good S1/S2, no murmur Resp:soft, non tender pregnant abdomen Ext: No edema Neuro: Alert and oriented, No gross deficits  FHT: baseline 140, moderte variability, accels present, no decels Toco: irreg q2-10+ minutes, also some irritability  Dilation: 5.5 Effacement (%): 70 Cervical Position: Middle Station: -2 Presentation: Vertex Exam by:: Dr. Thad Ranger   MAU Course  Procedures  4 mg zofran ODT X2 and 2 L LR bolus given  Assessment and Plan  27 y/o [id number] here at 37.0 for contractions, nasuea, and vomiting - Nausea improved with 2 doses of PO zofran and 2 L LR- Rx given for zofran - Contractions/vaginal pain are continuing, helped by nubain, pt declines Rx for percocet - No real cervical change after 4 + hours of  observation, also very irregular contractions seen on the toco - Given that she was dilated 5 cm on 3/6 and again on 3/13 and has not made change despite significant change we will dc home with routine pre-natal care follow up. - labor precautions reviewed in detail, return if contractions intensify or become closer together and more regular.   Kevin Fenton 12/23/2012, 11:27 AM

## 2012-12-24 ENCOUNTER — Encounter: Payer: Medicaid Other | Admitting: Obstetrics & Gynecology

## 2012-12-26 ENCOUNTER — Encounter: Payer: Self-pay | Admitting: Family Medicine

## 2012-12-26 ENCOUNTER — Ambulatory Visit (INDEPENDENT_AMBULATORY_CARE_PROVIDER_SITE_OTHER): Payer: Medicaid Other | Admitting: Family Medicine

## 2012-12-26 VITALS — BP 133/85 | Temp 96.5°F | Wt 190.3 lb

## 2012-12-26 DIAGNOSIS — O26879 Cervical shortening, unspecified trimester: Secondary | ICD-10-CM

## 2012-12-26 DIAGNOSIS — R8281 Pyuria: Secondary | ICD-10-CM

## 2012-12-26 DIAGNOSIS — R82998 Other abnormal findings in urine: Secondary | ICD-10-CM

## 2012-12-26 LAB — POCT URINALYSIS DIP (DEVICE)
Nitrite: NEGATIVE
Protein, ur: 100 mg/dL — AB

## 2012-12-26 MED ORDER — CEPHALEXIN 500 MG PO CAPS: 500.0000 mg | ORAL_CAPSULE | Freq: Four times a day (QID) | ORAL | Status: DC

## 2012-12-26 NOTE — Progress Notes (Signed)
Pulse- 87  Edema-ankles  Pain/pressure- lower abd and back  Vaginal discharge-clear

## 2012-12-26 NOTE — Progress Notes (Signed)
U/a with blood, leukocytes and prot.  Will treat for UTI, check culture. Has been to MAU for labor check.  Advanced dilation but no change.

## 2012-12-26 NOTE — Patient Instructions (Signed)
Contraception Choices Contraception (birth control) is the use of any methods or devices to prevent pregnancy. Below are some methods to help avoid pregnancy. HORMONAL METHODS   Contraceptive implant. This is a thin, plastic tube containing progesterone hormone. It does not contain estrogen hormone. Your caregiver inserts the tube in the inner part of the upper arm. The tube can remain in place for up to 3 years. After 3 years, the implant must be removed. The implant prevents the ovaries from releasing an egg (ovulation), thickens the cervical mucus which prevents sperm from entering the uterus, and thins the lining of the inside of the uterus.  Progesterone-only injections. These injections are given every 3 months by your caregiver to prevent pregnancy. This synthetic progesterone hormone stops the ovaries from releasing eggs. It also thickens cervical mucus and changes the uterine lining. This makes it harder for sperm to survive in the uterus.  Birth control pills. These pills contain estrogen and progesterone hormone. They work by stopping the egg from forming in the ovary (ovulation). Birth control pills are prescribed by a caregiver.Birth control pills can also be used to treat heavy periods.  Minipill. This type of birth control pill contains only the progesterone hormone. They are taken every day of each month and must be prescribed by your caregiver.  Birth control patch. The patch contains hormones similar to those in birth control pills. It must be changed once a week and is prescribed by a caregiver.  Vaginal ring. The ring contains hormones similar to those in birth control pills. It is left in the vagina for 3 weeks, removed for 1 week, and then a new one is put back in place. The patient must be comfortable inserting and removing the ring from the vagina.A caregiver's prescription is necessary.  Emergency contraception. Emergency contraceptives prevent pregnancy after unprotected  sexual intercourse. This pill can be taken right after sex or up to 5 days after unprotected sex. It is most effective the sooner you take the pills after having sexual intercourse. Emergency contraceptive pills are available without a prescription. Check with your pharmacist. Do not use emergency contraception as your only form of birth control. BARRIER METHODS   Female condom. This is a thin sheath (latex or rubber) that is worn over the penis during sexual intercourse. It can be used with spermicide to increase effectiveness.  Female condom. This is a soft, loose-fitting sheath that is put into the vagina before sexual intercourse.  Diaphragm. This is a soft, latex, dome-shaped barrier that must be fitted by a caregiver. It is inserted into the vagina, along with a spermicidal jelly. It is inserted before intercourse. The diaphragm should be left in the vagina for 6 to 8 hours after intercourse.  Cervical cap. This is a round, soft, latex or plastic cup that fits over the cervix and must be fitted by a caregiver. The cap can be left in place for up to 48 hours after intercourse.  Sponge. This is a soft, circular piece of polyurethane foam. The sponge has spermicide in it. It is inserted into the vagina after wetting it and before sexual intercourse.  Spermicides. These are chemicals that kill or block sperm from entering the cervix and uterus. They come in the form of creams, jellies, suppositories, foam, or tablets. They do not require a prescription. They are inserted into the vagina with an applicator before having sexual intercourse. The process must be repeated every time you have sexual intercourse. INTRAUTERINE CONTRACEPTION  Intrauterine device (  IUD). This is a T-shaped device that is put in a woman's uterus during a menstrual period to prevent pregnancy. There are 2 types:  Copper IUD. This type of IUD is wrapped in copper wire and is placed inside the uterus. Copper makes the uterus and  fallopian tubes produce a fluid that kills sperm. It can stay in place for 10 years.  Hormone IUD. This type of IUD contains the hormone progestin (synthetic progesterone). The hormone thickens the cervical mucus and prevents sperm from entering the uterus, and it also thins the uterine lining to prevent implantation of a fertilized egg. The hormone can weaken or kill the sperm that get into the uterus. It can stay in place for 5 years. PERMANENT METHODS OF CONTRACEPTION  Female tubal ligation. This is when the woman's fallopian tubes are surgically sealed, tied, or blocked to prevent the egg from traveling to the uterus.  Female sterilization. This is when the female has the tubes that carry sperm tied off (vasectomy).This blocks sperm from entering the vagina during sexual intercourse. After the procedure, the man can still ejaculate fluid (semen). NATURAL PLANNING METHODS  Natural family planning. This is not having sexual intercourse or using a barrier method (condom, diaphragm, cervical cap) on days the woman could become pregnant.  Calendar method. This is keeping track of the length of each menstrual cycle and identifying when you are fertile.  Ovulation method. This is avoiding sexual intercourse during ovulation.  Symptothermal method. This is avoiding sexual intercourse during ovulation, using a thermometer and ovulation symptoms.  Post-ovulation method. This is timing sexual intercourse after you have ovulated. Regardless of which type or method of contraception you choose, it is important that you use condoms to protect against the transmission of sexually transmitted diseases (STDs). Talk with your caregiver about which form of contraception is most appropriate for you. Document Released: 09/25/2005 Document Revised: 12/18/2011 Document Reviewed: 02/01/2011 [employer] Patient Information 2013 [employer]. Breastfeeding Deciding to breastfeed is one of the best choices you can make  for you and your baby. The information that follows gives a brief overview of the benefits of breastfeeding as well as common topics surrounding breastfeeding. BENEFITS OF BREASTFEEDING For the baby  The first milk (colostrum) helps the baby's digestive system function better.   There are antibodies in the mother's milk that help the baby fight off infections.   The baby has a lower incidence of asthma, allergies, and sudden infant death syndrome (SIDS).   The nutrients in breast milk are better for the baby than infant formulas, and breast milk helps the baby's brain grow better.   Babies who breastfeed have less gas, colic, and constipation.  For the mother  Breastfeeding helps develop a very special bond between the mother and her baby.   Breastfeeding is convenient, always available at the correct temperature, and costs nothing.   Breastfeeding burns calories in the mother and helps her lose weight that was gained during pregnancy.   Breastfeeding makes the uterus contract back down to normal size faster and slows bleeding following delivery.   Breastfeeding mothers have a lower risk of developing breast cancer.  BREASTFEEDING FREQUENCY  A healthy, full-term baby may breastfeed as often as every hour or space his or her feedings to every 3 hours.   Watch your baby for signs of hunger. Nurse your baby if he or she shows signs of hunger. How often you nurse will vary from baby to baby.   Nurse as often as   the baby requests, or when you feel the need to reduce the fullness of your breasts.   Awaken the baby if it has been 3 4 hours since the last feeding.   Frequent feeding will help the mother make more milk and will help prevent problems, such as sore nipples and engorgement of the breasts.  BABY'S POSITION AT THE BREAST  Whether lying down or sitting, be sure that the baby's tummy is facing your tummy.   Support the breast with 4 fingers underneath the  breast and the thumb above. Make sure your fingers are well away from the nipple and baby's mouth.   Stroke the baby's lips gently with your finger or nipple.   When the baby's mouth is open wide enough, place all of your nipple and as much of the areola as possible into your baby's mouth.   Pull the baby in close so the tip of the nose and the baby's cheeks touch the breast during the feeding.  FEEDINGS AND SUCTION  The length of each feeding varies from baby to baby and from feeding to feeding.   The baby must suck about 2 3 minutes for your milk to get to him or her. This is called a "let down." For this reason, allow the baby to feed on each breast as long as he or she wants. Your baby will end the feeding when he or she has received the right balance of nutrients.   To break the suction, put your finger into the corner of the baby's mouth and slide it between his or her gums before removing your breast from his or her mouth. This will help prevent sore nipples.  HOW TO TELL WHETHER YOUR BABY IS GETTING ENOUGH BREAST MILK. Wondering whether or not your baby is getting enough milk is a common concern among mothers. You can be assured that your baby is getting enough milk if:   Your baby is actively sucking and you hear swallowing.   Your baby seems relaxed and satisfied after a feeding.   Your baby nurses at least 8 12 times in a 24 hour time period. Nurse your baby until he or she unlatches or falls asleep at the first breast (at least 10 20 minutes), then offer the second side.   Your baby is wetting 5 6 disposable diapers (6 8 cloth diapers) in a 24 hour period by 5 6 days of age.   Your baby is having at least 3 4 stools every 24 hours for the first 6 weeks. The stool should be soft and yellow.   Your baby should gain 4 7 ounces per week after he or she is 4 days old.   Your breasts feel softer after nursing.  REDUCING BREAST ENGORGEMENT  In the first week after  your baby is born, you may experience signs of breast engorgement. When breasts are engorged, they feel heavy, warm, full, and may be tender to the touch. You can reduce engorgement if you:   Nurse frequently, every 2 3 hours. Mothers who breastfeed early and often have fewer problems with engorgement.   Place light ice packs on your breasts for 10 20 minutes between feedings. This reduces swelling. Wrap the ice packs in a lightweight towel to protect your skin. Bags of frozen vegetables work well for this purpose.   Take a warm shower or apply warm, moist heat to your breast for 5 10 minutes just before each feeding. This increases circulation and helps the   milk flow.   Gently massage your breast before and during the feeding. Using your finger tips, massage from the chest wall towards your nipple in a circular motion.   Make sure that the baby empties at least one breast at every feeding before switching sides.   Use a breast pump to empty the breasts if your baby is sleepy or not nursing well. You may also want to pump if you are returning to work oryou feel you are getting engorged.   Avoid bottle feeds, pacifiers, or supplemental feedings of water or juice in place of breastfeeding. Breast milk is all the food your baby needs. It is not necessary for your baby to have water or formula. In fact, to help your breasts make more milk, it is best not to give your baby supplemental feedings during the early weeks.   Be sure the baby is latched on and positioned properly while breastfeeding.   Wear a supportive bra, avoiding underwire styles.   Eat a balanced diet with enough fluids.   Rest often, relax, and take your prenatal vitamins to prevent fatigue, stress, and anemia.  If you follow these suggestions, your engorgement should improve in 24 48 hours. If you are still experiencing difficulty, call your lactation consultant or caregiver.  CARING FOR YOURSELF Take care of your  breasts  Bathe or shower daily.   Avoid using soap on your nipples.   Start feedings on your left breast at one feeding and on your right breast at the next feeding.   You will notice an increase in your milk supply 2 5 days after delivery. You may feel some discomfort from engorgement, which makes your breasts very firm and often tender. Engorgement "peaks" out within 24 48 hours. In the meantime, apply warm moist towels to your breasts for 5 10 minutes before feeding. Gentle massage and expression of some milk before feeding will soften your breasts, making it easier for your baby to latch on.   Wear a well-fitting nursing bra, and air dry your nipples for a 3 4minutes after each feeding.   Only use cotton bra pads.   Only use pure lanolin on your nipples after nursing. You do not need to wash it off before feeding the baby again. Another option is to express a few drops of breast milk and gently massage it into your nipples.  Take care of yourself  Eat well-balanced meals and nutritious snacks.   Drinking milk, fruit juice, and water to satisfy your thirst (about 8 glasses a day).   Get plenty of rest.  Avoid foods that you notice affect the baby in a bad way.  SEEK MEDICAL CARE IF:   You have difficulty with breastfeeding and need help.   You have a hard, red, sore area on your breast that is accompanied by a fever.   Your baby is too sleepy to eat well or is having trouble sleeping.   Your baby is wetting less than 6 diapers a day, by 5 days of age.   Your baby's skin or white part of his or her eyes is more yellow than it was in the hospital.   You feel depressed.  Document Released: 09/25/2005 Document Revised: 03/26/2012 Document Reviewed: 12/24/2011 [employer] Patient Information 2013 [employer].  

## 2012-12-26 NOTE — MAU Provider Note (Signed)
I saw and examined patient and reviewed prenatal history, labs and imaging. I also personally reviewed fetal heart tracing, which was reactive. Patient's cervix remained unchanged from previous checks. Agree with above resident note. Napoleon Form, MD

## 2012-12-28 LAB — CULTURE, OB URINE: Colony Count: 30000

## 2013-01-02 ENCOUNTER — Encounter: Payer: Self-pay | Admitting: Advanced Practice Midwife

## 2013-01-02 ENCOUNTER — Encounter (HOSPITAL_COMMUNITY): Payer: Self-pay | Admitting: Anesthesiology

## 2013-01-02 ENCOUNTER — Ambulatory Visit (INDEPENDENT_AMBULATORY_CARE_PROVIDER_SITE_OTHER): Payer: Medicaid Other | Admitting: Advanced Practice Midwife

## 2013-01-02 ENCOUNTER — Inpatient Hospital Stay (HOSPITAL_COMMUNITY): Payer: Medicaid Other | Admitting: Anesthesiology

## 2013-01-02 ENCOUNTER — Inpatient Hospital Stay (HOSPITAL_COMMUNITY)
Admission: AD | Admit: 2013-01-02 | Discharge: 2013-01-04 | DRG: 775 | Disposition: A | Discharge status: Home / Self Care | Payer: Medicaid Other | Source: Ambulatory Visit | Attending: Obstetrics and Gynecology | Admitting: Obstetrics and Gynecology

## 2013-01-02 ENCOUNTER — Encounter (HOSPITAL_COMMUNITY): Payer: Self-pay | Admitting: *Deleted

## 2013-01-02 VITALS — BP 137/92 | Temp 97.0°F | Wt 189.4 lb

## 2013-01-02 DIAGNOSIS — IMO0001 Reserved for inherently not codable concepts without codable children: Secondary | ICD-10-CM

## 2013-01-02 DIAGNOSIS — O09219 Supervision of pregnancy with history of pre-term labor, unspecified trimester: Secondary | ICD-10-CM

## 2013-01-02 DIAGNOSIS — O169 Unspecified maternal hypertension, unspecified trimester: Secondary | ICD-10-CM

## 2013-01-02 DIAGNOSIS — O09893 Supervision of other high risk pregnancies, third trimester: Secondary | ICD-10-CM

## 2013-01-02 DIAGNOSIS — O139 Gestational [pregnancy-induced] hypertension without significant proteinuria, unspecified trimester: Principal | ICD-10-CM | POA: Diagnosis present

## 2013-01-02 HISTORY — DX: Anemia, unspecified: D64.9

## 2013-01-02 HISTORY — DX: Headache: R51

## 2013-01-02 LAB — PROTEIN / CREATININE RATIO, URINE
Creatinine, Urine: 119.06 mg/dL
Protein Creatinine Ratio: 0.08 (ref 0.00–0.15)
Total Protein, Urine: 9.6 mg/dL

## 2013-01-02 LAB — CBC
Hemoglobin: 11 g/dL — ABNORMAL LOW (ref 12.0–15.0)
MCHC: 32.3 g/dL (ref 30.0–36.0)
MCHC: 31.8 g/dL (ref 30.0–36.0)
MCV: 81.8 fL (ref 78.0–100.0)
Platelets: 212 10*3/uL (ref 150–400)
Platelets: 212 10*3/uL (ref 150–400)
RDW: 13.9 % (ref 11.5–15.5)
RDW: 14 % (ref 11.5–15.5)
WBC: 6.5 10*3/uL (ref 4.0–10.5)

## 2013-01-02 LAB — POCT URINALYSIS DIP (DEVICE)
Protein, ur: 30 mg/dL — AB
Specific Gravity, Urine: 1.02 (ref 1.005–1.030)
Urobilinogen, UA: 2 mg/dL — ABNORMAL HIGH (ref 0.0–1.0)
pH: 6 (ref 5.0–8.0)

## 2013-01-02 LAB — COMPREHENSIVE METABOLIC PANEL
AST: 9 U/L (ref 0–37)
Albumin: 2.5 g/dL — ABNORMAL LOW (ref 3.5–5.2)
BUN: 5 mg/dL — ABNORMAL LOW (ref 6–23)
Calcium: 9.3 mg/dL (ref 8.4–10.5)
Chloride: 102 mEq/L (ref 96–112)
Creatinine, Ser: 0.67 mg/dL (ref 0.50–1.10)
Total Bilirubin: 0.4 mg/dL (ref 0.3–1.2)
Total Protein: 6.4 g/dL (ref 6.0–8.3)

## 2013-01-02 MED ORDER — LACTATED RINGERS IV SOLN: 500.0000 mL | INTRAVENOUS | Status: DC | PRN

## 2013-01-02 MED ORDER — FENTANYL 2.5 MCG/ML BUPIVACAINE 1/10 % EPIDURAL INFUSION (WH - ANES)
INTRAMUSCULAR | Status: DC | PRN
  Administered 2013-01-02: 14 mL/h via EPIDURAL

## 2013-01-02 MED ORDER — IBUPROFEN 600 MG PO TABS: 600.0000 mg | ORAL_TABLET | Freq: Four times a day (QID) | ORAL | Status: DC | PRN

## 2013-01-02 MED ORDER — FENTANYL 2.5 MCG/ML BUPIVACAINE 1/10 % EPIDURAL INFUSION (WH - ANES)
14.0000 mL/h | INTRAMUSCULAR | Status: DC | PRN
  Filled 2013-01-02: qty 125

## 2013-01-02 MED ORDER — ONDANSETRON HCL 4 MG/2ML IJ SOLN
4.0000 mg | Freq: Four times a day (QID) | INTRAMUSCULAR | Status: DC | PRN
  Administered 2013-01-02: 4 mg via INTRAVENOUS
  Filled 2013-01-02: qty 2

## 2013-01-02 MED ORDER — EPHEDRINE 5 MG/ML INJ
10.0000 mg | INTRAVENOUS | Status: DC | PRN
Start: 2013-01-02 — End: 2013-01-03
  Filled 2013-01-02: qty 2

## 2013-01-02 MED ORDER — LACTATED RINGERS IV SOLN
500.0000 mL | Freq: Once | INTRAVENOUS | Status: AC
  Administered 2013-01-02: 21:00:00 via INTRAVENOUS

## 2013-01-02 MED ORDER — LIDOCAINE HCL (PF) 1 % IJ SOLN
INTRAMUSCULAR | Status: DC | PRN
  Administered 2013-01-02 (×2): 4 mL

## 2013-01-02 MED ORDER — FENTANYL CITRATE 0.05 MG/ML IJ SOLN
100.0000 ug | INTRAMUSCULAR | Status: DC | PRN
  Administered 2013-01-02 (×2): 100 ug via INTRAVENOUS
  Filled 2013-01-02 (×2): qty 2

## 2013-01-02 MED ORDER — DIPHENHYDRAMINE HCL 50 MG/ML IJ SOLN: 12.5000 mg | INTRAMUSCULAR | Status: DC | PRN

## 2013-01-02 MED ORDER — OXYTOCIN 40 UNITS IN LACTATED RINGERS INFUSION - SIMPLE MED
62.5000 mL/h | INTRAVENOUS | Status: DC
  Filled 2013-01-02: qty 1000

## 2013-01-02 MED ORDER — LIDOCAINE HCL (PF) 1 % IJ SOLN
30.0000 mL | INTRAMUSCULAR | Status: DC | PRN
  Filled 2013-01-02 (×2): qty 30

## 2013-01-02 MED ORDER — ACETAMINOPHEN 325 MG PO TABS: 650.0000 mg | ORAL_TABLET | ORAL | Status: DC | PRN

## 2013-01-02 MED ORDER — PHENYLEPHRINE 40 MCG/ML (10ML) SYRINGE FOR IV PUSH (FOR BLOOD PRESSURE SUPPORT)
80.0000 ug | PREFILLED_SYRINGE | INTRAVENOUS | Status: DC | PRN
  Filled 2013-01-02: qty 2

## 2013-01-02 MED ORDER — EPHEDRINE 5 MG/ML INJ
10.0000 mg | INTRAVENOUS | Status: DC | PRN
  Filled 2013-01-02: qty 4
  Filled 2013-01-02: qty 2

## 2013-01-02 MED ORDER — OXYTOCIN BOLUS FROM INFUSION
500.0000 mL | INTRAVENOUS | Status: DC
  Administered 2013-01-02: 500 mL via INTRAVENOUS

## 2013-01-02 MED ORDER — PHENYLEPHRINE 40 MCG/ML (10ML) SYRINGE FOR IV PUSH (FOR BLOOD PRESSURE SUPPORT)
80.0000 ug | PREFILLED_SYRINGE | INTRAVENOUS | Status: DC | PRN
  Filled 2013-01-02: qty 2
  Filled 2013-01-02: qty 5

## 2013-01-02 MED ORDER — OXYTOCIN 40 UNITS IN LACTATED RINGERS INFUSION - SIMPLE MED
1.0000 m[IU]/min | INTRAVENOUS | Status: DC
  Administered 2013-01-02: 2 m[IU]/min via INTRAVENOUS

## 2013-01-02 MED ORDER — FENTANYL 2.5 MCG/ML BUPIVACAINE 1/10 % EPIDURAL INFUSION (WH - ANES): 14.0000 mL/h | INTRAMUSCULAR | Status: DC | PRN

## 2013-01-02 MED ORDER — LACTATED RINGERS IV SOLN
INTRAVENOUS | Status: DC
  Administered 2013-01-02: 15:00:00 via INTRAVENOUS

## 2013-01-02 MED ORDER — OXYCODONE-ACETAMINOPHEN 5-325 MG PO TABS
1.0000 | ORAL_TABLET | ORAL | Status: DC | PRN
Start: 2013-01-02 — End: 2013-01-03

## 2013-01-02 MED ORDER — CITRIC ACID-SODIUM CITRATE 334-500 MG/5ML PO SOLN: 30.0000 mL | ORAL | Status: DC | PRN

## 2013-01-02 MED ORDER — ZOLPIDEM TARTRATE 5 MG PO TABS: 5.0000 mg | ORAL_TABLET | Freq: Every evening | ORAL | Status: DC | PRN

## 2013-01-02 NOTE — MAU Note (Signed)
Hx of CHTN, BP up at clinic visit, sent here for further eval.  Was 4-5cm on exam, membranes were swept as reported by Artelia Laroche CNM.

## 2013-01-02 NOTE — Patient Instructions (Signed)
Normal Labor and Delivery  Your caregiver must first be sure you are in labor. Signs of labor include:  · You may pass what is called "the mucus plug" before labor begins. This is a small amount of blood stained mucus.  · Regular uterine contractions.  · The time between contractions get closer together.  · The discomfort and pain gradually gets more intense.  · Pains are mostly located in the back.  · Pains get worse when walking.  · The cervix (the opening of the uterus becomes thinner (begins to efface) and opens up (dilates).  Once you are in labor and admitted into the hospital or care center, your caregiver will do the following:  · A complete physical examination.  · Check your vital signs (blood pressure, pulse, temperature and the fetal heart rate).  · Do a vaginal examination (using a sterile glove and lubricant) to determine:  · The position (presentation) of the baby (head [vertex] or buttock first).  · The level (station) of the baby's head in the birth canal.  · The effacement and dilatation of the cervix.  · You may have your pubic hair shaved and be given an enema depending on your caregiver and the circumstance.  · An electronic monitor is usually placed on your abdomen. The monitor follows the length and intensity of the contractions, as well as the baby's heart rate.  · Usually, your caregiver will insert an IV in your arm with a bottle of sugar water. This is done as a precaution so that medications can be given to you quickly during labor or delivery.  NORMAL LABOR AND DELIVERY IS DIVIDED UP INTO 3 STAGES:  First Stage  This is when regular contractions begin and the cervix begins to efface and dilate. This stage can last from 3 to 15 hours. The end of the first stage is when the cervix is 100% effaced and 10 centimeters dilated. Pain medications may be given by   · Injection (morphine, demerol, etc.)  · Regional anesthesia (spinal, caudal or epidural, anesthetics given in different locations of  the spine). Paracervical pain medication may be given, which is an injection of and anesthetic on each side of the cervix.  A pregnant woman may request to have "Natural Childbirth" which is not to have any medications or anesthesia during her labor and delivery.  Second Stage  This is when the baby comes down through the birth canal (vagina) and is born. This can take 1 to 4 hours. As the baby's head comes down through the birth canal, you may feel like you are going to have a bowel movement. You will get the urge to bear down and push until the baby is delivered. As the baby's head is being delivered, the caregiver will decide if an episiotomy (a cut in the perineum and vagina area) is needed to prevent tearing of the tissue in this area. The episiotomy is sewn up after the delivery of the baby and placenta. Sometimes a mask with nitrous oxide is given for the mother to breath during the delivery of the baby to help if there is too much pain. The end of Stage 2 is when the baby is fully delivered. Then when the umbilical cord stops pulsating it is clamped and cut.  Third Stage  The third stage begins after the baby is completely delivered and ends after the placenta (afterbirth) is delivered. This usually takes 5 to 30 minutes. After the placenta is delivered, a medication   is given either by intravenous or injection to help contract the uterus and prevent bleeding. The third stage is not painful and pain medication is usually not necessary. If an episiotomy was done, it is repaired at this time.  After the delivery, the mother is watched and monitored closely for 1 to 2 hours to make sure there is no postpartum bleeding (hemorrhage). If there is a lot of bleeding, medication is given to contract the uterus and stop the bleeding.  Document Released: 07/04/2008 Document Revised: 12/18/2011 Document Reviewed: 07/04/2008  [employer]® Patient Information ©2013 [employer].

## 2013-01-02 NOTE — MAU Note (Signed)
Nauseated, crackers and fluids given.. Pt had thrown up.  BP noted low, pt states, dr was having her move around that was when she got sick- also BP probably  related.

## 2013-01-02 NOTE — Progress Notes (Signed)
Some headaches off and on. Sees spots daily. BP elevated today. Trace-1+ edema, DTRs 3+ with one beat clonus. Will send to MAU for PIH eval. Membranes swept

## 2013-01-02 NOTE — Anesthesia Preprocedure Evaluation (Addendum)
Anesthesia Evaluation  Patient identified by MRN, date of birth, ID band Patient awake    Reviewed: Allergy & Precautions, H&P , Patient's Chart, lab work & pertinent test results  Airway Mallampati: II TM Distance: >3 FB Neck ROM: full    Dental no notable dental hx. (+) Teeth Intact   Pulmonary neg pulmonary ROS,  breath sounds clear to auscultation  Pulmonary exam normal       Cardiovascular hypertension, negative cardio ROS  Rhythm:regular Rate:Normal  PIH   Neuro/Psych  Headaches, PSYCHIATRIC DISORDERS Depression    GI/Hepatic negative GI ROS, Neg liver ROS,   Endo/Other  negative endocrine ROS  Renal/GU negative Renal ROS     Musculoskeletal   Abdominal Normal abdominal exam  (+)   Peds  Hematology  (+) Blood dyscrasia, anemia ,   Anesthesia Other Findings Chlamydia     Trichimoniasis        Gonorrhea     Pregnancy induced hypertension        Preterm labor     Urinary tract infection        Abnormal Pap smear   cryo on cervix Depression        Headache     Anemia    Reproductive/Obstetrics (+) Pregnancy                          Anesthesia Physical Anesthesia Plan  ASA: III  Anesthesia Plan: Epidural   Post-op Pain Management:    Induction:   Airway Management Planned:   Additional Equipment:   Intra-op Plan:   Post-operative Plan:   Informed Consent: I have reviewed the patients History and Physical, chart, labs and discussed the procedure including the risks, benefits and alternatives for the proposed anesthesia with the patient or authorized representative who has indicated his/her understanding and acceptance.     Plan Discussed with: Anesthesiologist  Anesthesia Plan Comments:        Anesthesia Quick Evaluation

## 2013-01-02 NOTE — Anesthesia Procedure Notes (Signed)
Epidural Patient location during procedure: OB Start time: 01/02/2013 9:34 PM  Staffing Anesthesiologist: Jaiah Weigel A. Performed by: anesthesiologist   Preanesthetic Checklist Completed: patient identified, site marked, surgical consent, pre-op evaluation, timeout performed, IV checked, risks and benefits discussed and monitors and equipment checked  Epidural Patient position: sitting Prep: site prepped and draped and DuraPrep Patient monitoring: continuous pulse ox and blood pressure Approach: midline Injection technique: LOR air  Needle:  Needle type: Tuohy  Needle gauge: 17 G Needle length: 9 cm and 9 Needle insertion depth: 6 cm Catheter type: closed end flexible Catheter size: 19 Gauge Catheter at skin depth: 11 cm Test dose: negative and Other  Assessment Events: blood not aspirated, injection not painful, no injection resistance, negative IV test and no paresthesia  Additional Notes Patient identified. Risks and benefits discussed including failed block, incomplete  Pain control, post dural puncture headache, nerve damage, paralysis, blood pressure Changes, nausea, vomiting, reactions to medications-both toxic and allergic and post Partum back pain. All questions were answered. Patient expressed understanding and wished to proceed. Sterile technique was used throughout procedure. Epidural site was Dressed with sterile barrier dressing. No paresthesias, signs of intravascular injection Or signs of intrathecal spread were encountered.  Patient was more comfortable after the epidural was dosed. Please see RN's note for documentation of vital signs and FHR which are stable.

## 2013-01-02 NOTE — Progress Notes (Signed)
   Subjective: Pt reports slight increase in contractions.  Objective: BP 117/81  Pulse 99  Temp(Src) 98.3 F (36.8 C) (Oral)  Resp 20  Ht 5' 4.5" (1.638 m)  Wt 86.365 kg (190 lb 6.4 oz)  BMI 32.19 kg/m2  LMP 03/11/2012      FHT:  FHR: 130's bpm, variability: moderate,  accelerations:  Present,  decelerations:  Absent UC:   irregular, every 6-8 minutes SVE:   Dilation: 7 Effacement (%): 70 Station: -1 Exam by:: Jerolyn Center, CNM  Labs: Lab Results  Component Value Date   WBC 6.4 01/02/2013   HGB 11.0* 01/02/2013   HCT 34.6* 01/02/2013   MCV 81.2 01/02/2013   PLT 212 01/02/2013    Assessment / Plan: Labor  Labor: Active Labor Preeclampsia:  n/a Fetal Wellbeing:  Category I Pain Control:  Labor support without medications I/D:  n/a Anticipated MOD:  NSVD  Begin low dose pitocin.  [facility] 01/02/2013, 7:11 PM

## 2013-01-02 NOTE — Progress Notes (Signed)
Patient c/o rectal pressure.

## 2013-01-02 NOTE — H&P (Signed)
Christine Cervantes is a 27 y.o. female [phone] at [redacted]w[redacted]d with hx of PTL presenting for evaluation of hypertension vs pre-eclampsia.  Maternal Medical History:  Reason for admission: Nausea.    Outsided clinical course: Pt was noted to have a blood pressure of 137/92 with 30 protein and 3+ DTR and 1 beat of clonus  # Hypertension:  Pt was sent into MAU from outside clinic for concern of gestational hypertension vs pre-eclampsia and consideration for induction of labor.  Pt reports steadily elevating blood pressures over the last few weeks.  Pt mentions swelling of hands associated with occasional headaches with blurry vision.  Pt denies RUQ pain, bleeding or bruising.  Headache and blurry vision improve when seated, worse with standing, nothing else tried.  Pt denies pain with urination, increased urinary frequency, n/v/d/c, chest pain or palpitations.    Hx of PTL: Pt was admitted at [redacted]w[redacted]d for PTL with a hx of PTD currently receiving 17-P with a cervix of 2/thick/posterior.  Pt received mag sulfate for tocolysis and neuro prophylaxis, pt received betamethasone x 2 NST was reassuring hospitalization and pt did not have cervical change from admission and pt was discharged home on procardia.      OB History   Grav Para Term Preterm Abortions TAB SAB Ect Mult Living   4 2 1 1 1  1   2      Past Medical History  Diagnosis Date  . Chlamydia   . Trichimoniasis   . Gonorrhea   . Pregnancy induced hypertension   . Preterm labor   . Urinary tract infection   . Abnormal Pap smear     cryo on cervix  . Depression   . Headache   . Anemia    Past Surgical History  Procedure Laterality Date  . Foot surgery  2004-2004    corns removed from both feet, hammertoes repaired  . Gynecologic cryosurgery     Family History: family history includes Diabetes in her paternal grandmother and Hypertension in her maternal aunt, maternal grandmother, and mother.  There is no history of Anesthesia problems and  Other. Social History:  reports that she has never smoked. She has never used smokeless tobacco. She reports that she does not drink alcohol or use illicit drugs.   Prenatal Transfer Tool  Maternal Diabetes: No Genetic Screening: Normal Maternal Ultrasounds/Referrals: Normal Fetal Ultrasounds or other Referrals:  None Maternal Substance Abuse:  No Significant Maternal Medications:  Meds include: Other: 17-P until 35 weeks, betamethasone  Significant Maternal Lab Results:  Lab values include: Group B Strep negative, Other: Rubella Other Comments:  Concern for PIH vs Pre-Eclampsia  Review of Systems  Constitutional: Negative for fever and chills.  HENT: Negative for sore throat.   Eyes: Positive for blurred vision. Negative for double vision.  Respiratory: Negative for cough, shortness of breath and wheezing.   Cardiovascular: Negative for chest pain, palpitations and leg swelling.  Gastrointestinal: Negative for heartburn, nausea, vomiting, abdominal pain, diarrhea and constipation.  Genitourinary: Negative for dysuria, urgency and frequency.  Musculoskeletal: Negative for myalgias and joint pain.  Skin: Negative for itching and rash.  Neurological: Positive for dizziness (when standing). Negative for tingling, focal weakness and headaches.  Endo/Heme/Allergies: Does not bruise/bleed easily.  All other systems reviewed and are negative.      Blood pressure 121/76, pulse 87, temperature 98.4 F (36.9 C), temperature source Oral, resp. rate 18, height 5' 4.5" (1.638 m), weight 86.365 kg (190 lb 6.4 oz), last menstrual period  03/11/2012. Maternal Exam:  Uterine Assessment: Contraction strength is mild.  Introitus: not evaluated.   Cervix: Cervix evaluated by digital exam.     Fetal Exam Fetal Monitor Review: Mode: ultrasound.   Baseline rate: 150.  Variability: moderate (6-25 bpm).   Pattern: accelerations present and no decelerations.    Fetal State Assessment: Category I -  tracings are normal.     Physical Exam  Vitals reviewed. Constitutional: She is oriented to person, place, and time. She appears well-developed and well-nourished. No distress.  HENT:  Head: Normocephalic and atraumatic.  Eyes: EOM are normal. Pupils are equal, round, and reactive to light.  Neck: Normal range of motion. Neck supple.  Cardiovascular: Normal rate, regular rhythm, normal heart sounds and intact distal pulses.  Exam reveals no gallop and no friction rub.   No murmur heard. Respiratory: Effort normal and breath sounds normal. No respiratory distress. She has no wheezes. She has no rales.  GI: Soft. Bowel sounds are normal. She exhibits no distension. There is no tenderness. There is no rebound, no guarding and no CVA tenderness.  Genitourinary:  Cervical exam from outside facility: 4/70/-1  Musculoskeletal: Normal range of motion. She exhibits no edema and no tenderness.  Neurological: She is alert and oriented to person, place, and time. She displays abnormal reflex (unable to illicit patellar reflex, 2 beats of clonus, 2+ achelies refelex bialterlaly ).  Skin: Skin is warm and dry. She is not diaphoretic. No erythema.  Psychiatric: She has a normal mood and affect. Her behavior is normal. Thought content normal.    Results for orders placed during the hospital encounter of 01/02/13 (from the past 24 hour(s))  PROTEIN / CREATININE RATIO, URINE     Status: None   Collection Time    01/02/13 11:40 AM      Result Value Range   Creatinine, Urine 119.06     Total Protein, Urine 9.6     PROTEIN CREATININE RATIO 0.08  0.00 - 0.15  CBC     Status: Abnormal   Collection Time    01/02/13 11:50 AM      Result Value Range   WBC 6.5  4.0 - 10.5 K/uL   RBC 4.12  3.87 - 5.11 MIL/uL   Hemoglobin 10.9 (*) 12.0 - 15.0 g/dL   HCT 16.1 (*) 09.6 - 04.5 %   MCV 81.8  78.0 - 100.0 fL   MCH 26.5  26.0 - 34.0 pg   MCHC 32.3  30.0 - 36.0 g/dL   RDW 40.9  81.1 - 91.4 %   Platelets 212   150 - 400 K/uL  COMPREHENSIVE METABOLIC PANEL     Status: Abnormal   Collection Time    01/02/13 11:50 AM      Result Value Range   Sodium 134 (*) 135 - 145 mEq/L   Potassium 4.4  3.5 - 5.1 mEq/L   Chloride 102  96 - 112 mEq/L   CO2 21  19 - 32 mEq/L   Glucose, Bld 82  70 - 99 mg/dL   BUN 5 (*) 6 - 23 mg/dL   Creatinine, Ser 7.82  0.50 - 1.10 mg/dL   Calcium 9.3  8.4 - 95.6 mg/dL   Total Protein 6.4  6.0 - 8.3 g/dL   Albumin 2.5 (*) 3.5 - 5.2 g/dL   AST 9  0 - 37 U/L   ALT 6  0 - 35 U/L   Alkaline Phosphatase 184 (*) 39 - 117 U/L  Total Bilirubin 0.4  0.3 - 1.2 mg/dL   GFR calc non Af Amer >90  >90 mL/min   GFR calc Af Amer >90  >90 mL/min   U/S: 11/18/12: Impression: GA at [redacted]w[redacted]d. EFW at 38%ile. No late developing fetal abnormalities on visualized anatomy. AFI 17.3 cm. Shortened cervix at 1.8 cm in length.    Prenatal labs: ABO, Rh: --/--/O POS (02/10 4098) Antibody: NEG (02/10 0835) Rubella: 172.4 (11/27 1007) RPR: NON REACTIVE (03/06 1134)  HBsAg: NEGATIVE (11/27 1007)  HIV: NON REACTIVE (11/27 1007)  GBS:   Negative   Assessment/Plan: Christine Cervantes is a 27 y.o. female [phone] at [redacted]w[redacted]d with hx of PTL presenting for evaluation of hypertension vs pre-eclampsia.  # SOL: Pt presented for evaluation of hypertension and on subsequent cervical check prior to discharge pt had dilated her cervix to 6 cm a change from 4 cm during her prenatal clinical visit at 1102. -- admit to L&D -- anticipated NSVD -- no augmentation needed 2/2 appropriate cervical change  # Gestational hypertension: Pt was referred in for evaluation of elevated blood pressures.   -- blood pressures have been normotensive since arrival -- protein/creatine ratio 0.08 -- plts and LFTs wnl -- continue to monitor  DISPO: Diet: thin Activity: up ab lib Nursing: Per floor protocol Electrolytes: replace as needed Prophylaxis: GI: none; DVT early ambulation Code: Full  Ancipitate the pt to be admitted for  2 midnight(s) for anticipated NSVD.      Gregor Hams 01/02/2013, 2:21 PM

## 2013-01-02 NOTE — MAU Note (Signed)
Patient states she was seen in the [facility] today and sent to MAU for evaluation of elevated blood pressure. Patient states she has been having some contractions and spots before her eyes.

## 2013-01-02 NOTE — MAU Note (Signed)
Name and DOB verified. Pt confirms spelling is correct on armband

## 2013-01-02 NOTE — Progress Notes (Signed)
Christine Cervantes is a 27 y.o. [id number] at [redacted]w[redacted]d  admitted for induction of labor due to Hypertension.  Subjective: Much more comfortable with epidural placed.  No pressure. No HA, RUQ pain, vision changes.  Objective: BP 129/74  Pulse 86  Temp(Src) 97.5 F (36.4 C) (Oral)  Resp 18  Ht 5' 4.5" (1.638 m)  Wt 86.365 kg (190 lb 6.4 oz)  BMI 32.19 kg/m2  SpO2 98%  LMP 03/11/2012      FHT:  FHR: 130 bpm, variability: moderate,  accelerations:  Present,  decelerations:  Absent UC:   regular, every 3 minutes SVE:   Dilation: 8 Effacement (%): 90 Station: -1 Exam by:: dr. Fara Boros  Labs: Lab Results  Component Value Date   WBC 6.4 01/02/2013   HGB 11.0* 01/02/2013   HCT 34.6* 01/02/2013   MCV 81.2 01/02/2013   PLT 212 01/02/2013    Assessment / Plan: Induction of labor due to gestational hypertension,  progressing well on pitocin  Labor: AROMed, progressing, in active labor Preeclampsia:  gHTN only, no proteinuria or symptoms  Fetal Wellbeing:  Category I Pain Control:  Epidural I/D:  n/a Anticipated MOD:  NSVD  Christine Cervantes 01/02/2013, 10:14 PM

## 2013-01-03 ENCOUNTER — Encounter (HOSPITAL_COMMUNITY): Payer: Self-pay | Admitting: *Deleted

## 2013-01-03 LAB — RPR: RPR Ser Ql: NONREACTIVE

## 2013-01-03 MED ORDER — OXYCODONE-ACETAMINOPHEN 5-325 MG PO TABS
1.0000 | ORAL_TABLET | ORAL | Status: DC | PRN
  Administered 2013-01-03 (×2): 1 via ORAL
  Administered 2013-01-03 – 2013-01-04 (×2): 2 via ORAL
  Filled 2013-01-03: qty 2
  Filled 2013-01-03 (×2): qty 1
  Filled 2013-01-03: qty 2

## 2013-01-03 MED ORDER — TETANUS-DIPHTH-ACELL PERTUSSIS 5-2.5-18.5 LF-MCG/0.5 IM SUSP
0.5000 mL | Freq: Once | INTRAMUSCULAR | Status: AC
  Administered 2013-01-03: 0.5 mL via INTRAMUSCULAR
  Filled 2013-01-03: qty 0.5

## 2013-01-03 MED ORDER — DIBUCAINE 1 % RE OINT: 1.0000 "application " | TOPICAL_OINTMENT | RECTAL | Status: DC | PRN

## 2013-01-03 MED ORDER — LANOLIN HYDROUS EX OINT: TOPICAL_OINTMENT | CUTANEOUS | Status: DC | PRN

## 2013-01-03 MED ORDER — BENZOCAINE-MENTHOL 20-0.5 % EX AERO: 1.0000 "application " | INHALATION_SPRAY | CUTANEOUS | Status: DC | PRN

## 2013-01-03 MED ORDER — DIPHENHYDRAMINE HCL 25 MG PO CAPS
25.0000 mg | ORAL_CAPSULE | Freq: Four times a day (QID) | ORAL | Status: DC | PRN
  Administered 2013-01-03: 25 mg via ORAL
  Filled 2013-01-03: qty 1

## 2013-01-03 MED ORDER — ONDANSETRON HCL 4 MG/2ML IJ SOLN: 4.0000 mg | INTRAMUSCULAR | Status: DC | PRN

## 2013-01-03 MED ORDER — SENNOSIDES-DOCUSATE SODIUM 8.6-50 MG PO TABS
2.0000 | ORAL_TABLET | Freq: Every day | ORAL | Status: DC
  Administered 2013-01-03: 2 via ORAL

## 2013-01-03 MED ORDER — IBUPROFEN 600 MG PO TABS
600.0000 mg | ORAL_TABLET | Freq: Four times a day (QID) | ORAL | Status: DC
  Administered 2013-01-03 – 2013-01-04 (×4): 600 mg via ORAL
  Filled 2013-01-03 (×6): qty 1

## 2013-01-03 MED ORDER — NALBUPHINE SYRINGE 5 MG/0.5 ML
10.0000 mg | INJECTION | INTRAMUSCULAR | Status: DC | PRN
  Administered 2013-01-03: 10 mg via INTRAVENOUS
  Filled 2013-01-03: qty 1

## 2013-01-03 MED ORDER — ONDANSETRON HCL 4 MG PO TABS: 4.0000 mg | ORAL_TABLET | ORAL | Status: DC | PRN

## 2013-01-03 MED ORDER — ZOLPIDEM TARTRATE 5 MG PO TABS: 5.0000 mg | ORAL_TABLET | Freq: Every evening | ORAL | Status: DC | PRN

## 2013-01-03 MED ORDER — PRENATAL MULTIVITAMIN CH
1.0000 | ORAL_TABLET | Freq: Every day | ORAL | Status: DC
  Administered 2013-01-03: 1 via ORAL
  Filled 2013-01-03: qty 1

## 2013-01-03 MED ORDER — SIMETHICONE 80 MG PO CHEW: 80.0000 mg | CHEWABLE_TABLET | ORAL | Status: DC | PRN

## 2013-01-03 MED ORDER — WITCH HAZEL-GLYCERIN EX PADS: 1.0000 "application " | MEDICATED_PAD | CUTANEOUS | Status: DC | PRN

## 2013-01-03 MED ORDER — INFLUENZA VIRUS VACC SPLIT PF IM SUSP
0.5000 mL | INTRAMUSCULAR | Status: AC
  Administered 2013-01-03: 0.5 mL via INTRAMUSCULAR
  Filled 2013-01-03: qty 0.5

## 2013-01-03 NOTE — Anesthesia Postprocedure Evaluation (Signed)
  Anesthesia Post-op Note  Patient: Christine Cervantes  Procedure(s) Performed: * No procedures listed *  Patient Location: Mother/Baby  Anesthesia Type:Epidural  Level of Consciousness: awake  Airway and Oxygen Therapy: Patient Spontanous Breathing  Post-op Pain: none  Post-op Assessment: Patient's Cardiovascular Status Stable, Respiratory Function Stable, Patent Airway, No signs of Nausea or vomiting, Adequate PO intake, Pain level controlled, No headache, No backache, No residual numbness and No residual motor weakness  Post-op Vital Signs: Reviewed and stable  Complications: No apparent anesthesia complications

## 2013-01-03 NOTE — Progress Notes (Signed)
Patient was referred for history of depression/anxiety. * Referral screened out by Clinical Social Worker because none of the following criteria appear to apply:  ~ History of anxiety/depression during this pregnancy, or of post-partum depression.  ~ Diagnosis of anxiety and/or depression within last 3 years, as per pt.  ~ History of depression due to pregnancy loss/loss of child  OR * Patient's symptoms currently being treated with medication and/or therapy.  Please contact the Clinical Social Worker if needs arise, or by the patient's request.  

## 2013-01-03 NOTE — Progress Notes (Signed)
UR chart review completed.  

## 2013-01-03 NOTE — Progress Notes (Signed)
Post Partum Day 1 Subjective: no complaints, up ad lib, voiding, tolerating PO and + flatus  Objective: Blood pressure 122/79, pulse 79, temperature 98.3 F (36.8 C), temperature source Oral, resp. rate 18, height 5' 4.5" (1.638 m), weight 86.365 kg (190 lb 6.4 oz), last menstrual period 03/11/2012, SpO2 98.00%, unknown if currently breastfeeding.  Physical Exam:  General: alert, cooperative and no distress Lochia: appropriate Uterine Fundus: firm Incision: n/a DVT Evaluation: No evidence of DVT seen on physical exam.   Recent Labs  01/02/13 1150 01/02/13 1500  HGB 10.9* 11.0*  HCT 33.7* 34.6*    Assessment/Plan: Plan for discharge tomorrow, Breastfeeding, Lactation consult and Contraception depo then Nexplanon   LOS: 1 day   Christine Cervantes 01/03/2013, 6:45 AM

## 2013-01-03 NOTE — Progress Notes (Signed)
I saw and examined patient and agree with above resident note. I reviewed history, delivery summary, labs and vitals. Yida Hyams, MD  

## 2013-01-03 NOTE — Progress Notes (Signed)
[facility] updated on SVE.  Dr to report to pt bedside now.

## 2013-01-04 ENCOUNTER — Other Ambulatory Visit: Payer: Self-pay | Admitting: Family Medicine

## 2013-01-04 MED ORDER — OXYCODONE-ACETAMINOPHEN 5-325 MG PO TABS: 1.0000 | ORAL_TABLET | ORAL | Status: DC | PRN

## 2013-01-04 MED ORDER — DOCUSATE SODIUM 100 MG PO CAPS: 100.0000 mg | ORAL_CAPSULE | Freq: Two times a day (BID) | ORAL | Status: DC | PRN

## 2013-01-04 MED ORDER — IBUPROFEN 600 MG PO TABS: 600.0000 mg | ORAL_TABLET | Freq: Four times a day (QID) | ORAL | Status: DC

## 2013-01-04 MED ORDER — MEDROXYPROGESTERONE ACETATE 150 MG/ML IM SUSP: 150.0000 mg | INTRAMUSCULAR | Status: DC

## 2013-01-04 NOTE — H&P (Signed)
I examined pt and agree with documentation above and resident plan of care. Christine Cervantes,Christine Cervantes  

## 2013-01-04 NOTE — Discharge Summary (Signed)
Obstetric Discharge Summary  Christine Cervantes is a 27 y.o. [phone] presenting at [redacted]w[redacted]d with contractions and elevated blood pressure. Her PIH labs were negative, including a urine protein:creatinine ratio of 0.08. Her blood pressures during labor and postpartum were normal except for a few DBP in the 90s. She was augmented with pitocin and went on to have a normal SVD with no complications. Her postpartum course was unremarkable. She is breast and bottle feeding and plans Nexplanon for contraception with Depo Provera shot this week.  Reason for Admission: onset of labor and elevated blood pressures Prenatal Procedures: none Intrapartum Procedures: spontaneous vaginal delivery Postpartum Procedures: none Complications-Operative and Postpartum: none Hemoglobin  Date Value Range Status  01/02/2013 11.0* 12.0 - 15.0 g/dL Final  4/54/0981 19.1   Final     CAPILLARY SAMPLE     HCT  Date Value Range Status  01/02/2013 34.6* 36.0 - 46.0 % Final    Physical Exam:  General: alert, cooperative and no distress Lochia: appropriate Uterine Fundus: firm Incision: n/a DVT Evaluation: No evidence of DVT seen on physical exam. Negative Homan's sign. No cords or calf tenderness. No significant calf/ankle edema.  Discharge Diagnoses: Term Pregnancy-delivered  Discharge Information: Date: 01/04/2013 Activity: pelvic rest Diet: routine Medications: PNV, Ibuprofen, Colace and Percocet Condition: stable Instructions: refer to practice specific booklet Discharge to: home Follow-up Information   Follow up with [facility] In 2 days. (For Depo shot. You should receive a call for a nurse visit for Depo shot. If you do not hear by Monday afternoon, call the number above to schedule.)    Contact information:   [address] [place] [address] [phone]      Follow up with [facility] In 5 weeks. (For postpartum visit and to discuss Implanon)    Contact information:   [address] [place] [address] [phone]      Newborn Data: Live born female  Birth Weight: 6 lb 3.1 oz (2810 g) APGAR: 8, 9  Home with mother.  Napoleon Form 01/04/2013, 9:12 AM

## 2013-01-04 NOTE — Lactation Note (Signed)
This note was copied from the chart of Christine Cervantes. Lactation Consultation Note Mom is latching baby when I enter room. Mom trying to latch baby on the left in football hold, but she was not holding baby's head, she was leaning into baby's face with her breast. Reviewed position and technique, ad inst mom to hold baby's head, at that time mom was able to easily latch baby. Baby maintains strong suck with rhythmic sucking and audible swallowing. Mom has many questions about using breast and bottle, states she is feeding baby at breast for 10 min then supplementing with formula. Enc mom to allow baby to self detach from the breast whenever she is finished, and to supplement only if necessary. Extensive teaching about the importance of the colostrum and frequent STS/ cue based feeding.  Enc mom to call the lactation office if she has any concerns, and to attend the BFSG. Questions answered.   Patient Name: Christine Cervantes [id number] Date: 01/04/2013 Reason for consult: Follow-up assessment   Maternal Data    Feeding Feeding Type: Formula Feeding method: Bottle Nipple Type: Slow - flow Length of feed: 15 min  LATCH Score/Interventions Latch: Grasps breast easily, tongue down, lips flanged, rhythmical sucking.  Audible Swallowing: Spontaneous and intermittent  Type of Nipple: Everted at rest and after stimulation  Comfort (Breast/Nipple): Soft / non-tender     Hold (Positioning): Assistance needed to correctly position infant at breast and maintain latch. Intervention(s): Breastfeeding basics reviewed;Support Pillows;Position options;Skin to skin  LATCH Score: 9  Lactation Tools Discussed/Used     Consult Status Consult Status: Complete    Lenard Forth 01/04/2013, 10:23 AM

## 2013-01-04 NOTE — Discharge Summary (Signed)

## 2013-01-07 ENCOUNTER — Ambulatory Visit: Payer: Medicaid Other

## 2013-01-08 ENCOUNTER — Ambulatory Visit (INDEPENDENT_AMBULATORY_CARE_PROVIDER_SITE_OTHER): Payer: Medicaid Other | Admitting: *Deleted

## 2013-01-08 VITALS — BP 167/82 | HR 73

## 2013-01-08 DIAGNOSIS — Z309 Encounter for contraceptive management, unspecified: Secondary | ICD-10-CM

## 2013-01-08 MED ORDER — MEDROXYPROGESTERONE ACETATE 150 MG/ML IM SUSP
150.0000 mg | Freq: Once | INTRAMUSCULAR | Status: AC
  Administered 2013-01-08: 150 mg via INTRAMUSCULAR

## 2013-01-08 NOTE — Progress Notes (Signed)
Patient is 6 days postpartum and here today for Depo Provera.  Depo given.  Next Depo due June 18-April 09, 2013 and appt card given to patient.  Gaylene Brooks, RN

## 2013-01-10 ENCOUNTER — Telehealth: Payer: Self-pay | Admitting: Family Medicine

## 2013-01-10 NOTE — Telephone Encounter (Signed)
Pt has appt 01/13/13 with Dr.McIntyre. PCP not available until 5/14 .Christine Cervantes

## 2013-01-10 NOTE — Telephone Encounter (Signed)
Spoke with nurse and she reports, that she told the pt to go back to [facility], but pt delivered baby on 27 th of March. We are her PCP. I told the nurse  that I would call the pt and schedule appt for f/up. BP 167/82 with [place] on 01/08/13. Called pt. Unable to reach. No answer. Lorenda Hatchet, Renato Battles

## 2013-01-10 NOTE — Telephone Encounter (Signed)
If pt is having elevated blood pressures, headache, and vision changes, she needs to be evaluated in the MAU for possible pre-eclampsia. I have precepted this with attending Dr. Mauricio Po who agrees.   Called patient back and spoke with her. She said she was seeing spots this morning and had a headache yesterday. I told patient that my strong recommendation is that she go to the MAU to be evaluated.  Pt expressed concern over what to do with her newborn and I advised that she could bring the newborn to the MAU with her if she had to, but that it was very important that she be seen tonight. Explained the potential severity of pre-eclampsia and that it can progress to seizures. Patient stated her understanding of these instructions.

## 2013-01-10 NOTE — Telephone Encounter (Signed)
Nurse went to home to take moms BP today which is 130/100 on both sides.  Mom had a headache and blurred vision with spots so nurse reminded her that she is supposed to call in to the office when this happens.

## 2013-01-13 ENCOUNTER — Ambulatory Visit: Payer: Medicaid Other | Admitting: Family Medicine

## 2013-01-13 ENCOUNTER — Telehealth: Payer: Self-pay | Admitting: Family Medicine

## 2013-01-13 NOTE — Telephone Encounter (Signed)
Attempted to call patient to check on her, as she no-showed to her appointment today. I had called her on Friday and strongly recommended to her that she go directly to the MAU to be evaluated for possible pre-eclampsia. From reviewing her medical record it appears that she did not ever present to the MAU or any [facility] affiliated site to be seen, and then no-showed today. When I called her just now she did not answer and I was unable to leave a voicemail as her mailbox was full.  If patient calls back, please advise that if she is having ANY abnormal symptoms (headache, vision changes, abdominal pain, nausea/vomiting, chest pain, swelling in her legs, or any altered metnal status) she should go directly to the MAU to be seen TONIGHT or she risks having a seizure or other serious problems.  If she is feeling entirely fine she should still come in tomorrow or the next day to see a doctor and have her blood pressure checked.  Precepted this patient with Dr. Earnest Bailey, who agrees with this plan.

## 2013-02-11 ENCOUNTER — Ambulatory Visit: Payer: Medicaid Other | Admitting: Family Medicine

## 2013-02-17 ENCOUNTER — Encounter: Payer: Self-pay | Admitting: Family Medicine

## 2013-02-17 ENCOUNTER — Ambulatory Visit (INDEPENDENT_AMBULATORY_CARE_PROVIDER_SITE_OTHER): Payer: Medicaid Other | Admitting: Family Medicine

## 2013-02-17 VITALS — BP 152/102 | HR 73 | Ht 64.5 in | Wt 192.0 lb

## 2013-02-17 DIAGNOSIS — N898 Other specified noninflammatory disorders of vagina: Secondary | ICD-10-CM

## 2013-02-17 DIAGNOSIS — N939 Abnormal uterine and vaginal bleeding, unspecified: Secondary | ICD-10-CM | POA: Insufficient documentation

## 2013-02-17 DIAGNOSIS — I10 Essential (primary) hypertension: Secondary | ICD-10-CM | POA: Insufficient documentation

## 2013-02-17 LAB — COMPREHENSIVE METABOLIC PANEL
ALT: 13 U/L (ref 0–35)
AST: 14 U/L (ref 0–37)
Albumin: 3.9 g/dL (ref 3.5–5.2)
Alkaline Phosphatase: 63 U/L (ref 39–117)
Glucose, Bld: 81 mg/dL (ref 70–99)
Potassium: 4.3 mEq/L (ref 3.5–5.3)
Sodium: 137 mEq/L (ref 135–145)
Total Protein: 6.8 g/dL (ref 6.0–8.3)

## 2013-02-17 LAB — CBC
Hemoglobin: 11.3 g/dL — ABNORMAL LOW (ref 12.0–15.0)
MCHC: 32 g/dL (ref 30.0–36.0)
Platelets: 225 10*3/uL (ref 150–400)
RDW: 14.7 % (ref 11.5–15.5)

## 2013-02-17 MED ORDER — LISINOPRIL 20 MG PO TABS: 20.0000 mg | ORAL_TABLET | Freq: Every day | ORAL | Status: DC

## 2013-02-17 NOTE — Patient Instructions (Addendum)
Thank you for coming in, today! I'm sorry you're not feeling well. I think some of your issues are due to your high blood pressure. I am prescribing a medication called lisinopril. It should help with your blood pressure. We will check a few labs today, to check your liver, kidneys, and thyroid. I want you to come back to clinic within the week, this Friday or next Monday. It is very important that we get your blood pressure under control and see what your labs look like. If you start having any of the following, call the clinic here, go to the MAU, or go to the emergency room:  Worse headaches that won't go away  Continued changes in your vision  Heavy vaginal bleeding that won't stop  High fever (over 100.3), vomiting, or worse abdominal pain Please feel free to call with any questions or concerns at any time, at [phone]. --Dr. Casper Harrison

## 2013-02-20 NOTE — Progress Notes (Signed)
Subjective:    Patient ID: Christine Cervantes, female    DOB: 04-21-1986, 27 y.o.   MRN: [id number]  HPI: Pt presents today for her 6-week post-partum visit (late visit--pt delivered at [redacted]w[redacted]d on March 27 and no-showed/canceled two visits, one for elevated BP at home; see below and also telephone notes bt Dr. Pollie Meyer 4/4 and 01/13/2013) and to be seen for elevated blood pressure and vaginal bleeding. Of note, pt had an admission around 31w for PTL (has a hx of PTD), and received BMZ, then was on 17-P until 35w.  Post-partum: Pt states she missed her appointments due to "not feeling well" and over concerns for her baby; baby is reportedly "not feeding well" and spitting up (seen in clinic the same day as this appointment by Dr. Fara Boros; her note reflects good growth and well-appearing baby). Pt states she has delayed bringing her baby in and coming to her own appointments because she "didn't want to hear anything bad."  -Since she delivered, she has had significantly decreased energy, feeling tired, not resting well; secondary to concerns over baby, as well as caring for a 7yo son and a 3yo daughter.  -Denies frank depression or anhedonia; is interested in her baby and in caring for all of her children, but does state she feels overwhelmed.  -Baby's father (her husband) is present and supportive. - She initially tried breast feeding, but is now formula-feeding; she states "my milk never came in good" and states the baby has been having an easier time with the bottle than with breast-feeding. -Pt received a Depo-Provera injection on 4/2; is undecided on final/further birth control method plans. -see also elevated BP and vaginal bleeding, below  BP: Pt admitted to [facility] on 3/27 after an elevated BP in OB clinic; after admission, pt had only a few elevated diastolic pressures and did not require any medication, and PIH labs were not indicative of definite pre-eclampsia. Pt went on to deliver without incident, but  home nurse checked pt's BP at home on 4/4, noted to be 130/100. Pt was advised to go to MAU immediately, but scheduled an outpt appointment, then no-showed.  -Pt has had changes in vision "off and on" since delivery, and headaches (mostly in the back of her head, sometimes on sides; pressure, dull/achey pain, sometimes "shooting" in her neck) -Pt has also had swelling in her feet "most days" since she delivered. -Pt states BP runs in the family. Pt has never taken medicine for BP; only has taken ASA for headaches, and vitamins.  Vaginal bleeding: Present since delivery; pt initially thought it was normal lochia/post-delivery bleeding, but it has persisted. Some days only "spotting" amounts. -Pt reports using tampons, sometimes up to 3 per day, but bleeding is variable, sometimes none for a day or two -Bleeding has been constant/relatively heavy for the past few days, sometimes associated with abdominal pain, but no fevers, N/V (pt has not felt feverish but has not checked) -Pt did have Depo-Provera injection on 4/2 as above; was on it "a long time ago" but does not think she had significant bleeding with it, before. -Denies other vaginal discharge, dysuria, severe/intractible abdominal pain.  In addition to the above documentation, pt's PMH, surgical history, FH, and SH all reviewed and updated where appropriate in the EMR. Pt is a previous smoker (quit early in this pregnancy).  Review of Systems: As above. Specifically denies depression or anhedonia/lack of interest, but does state she feels "overwhelmed" at times (baby's spitting up, etc, as  above). Otherwise, full 12-system ROS reviewed and negative.     Objective:   Physical Exam BP 152/102  Pulse 73  Ht 5' 4.5" (1.638 m)  Wt 192 lb (87.091 kg)  BMI 32.46 kg/m2 Gen: well-appearing female but does appear very tired HEENT: [place]/AT, sclerae slightly icteric but no conjunctival injection, no lid lag, EOMI, PERRLA  MMM, posterior oropharynx  clear, neck supple with full ROM Cardio: RRR, no murmur appreciated; distal pulses intact Pulm: CTAB, no wheezes, normal work of breathing Abd: soft, nondistended, BS+ Ext: some LE swelling but no pitting; otherwise warm, well-perfused Neuro/Psych: alert/oriented; does appear tired/slightly anxious with some flattening of affect, but denies frank depressed mood GU: external vulvar structures normal in appearance; no obvious bladder masses  Speculum: vaginal sidewalls normal, no discharge; cervix with some oozing blood but no brisk bleeding, no discharge, no obvious friability  Bimanual: uterus normal in size, some fundal/suprapubic tenderness but no adnexal masses or tenderness, no CMT     Assessment & Plan:  Documentation for recent hospitalization for delivery reviewed/used to clarify details of the HPI above.  Discussed with Dr. Michiel Sites. See problem list notes for details/work-up plans. Doubt true PP pre-eclampsia ~8 weeks from delivery; BP likely reflects systemic/essential HTN. Uncertain cause of vaginal bleeding. DDx includes thyroid abnormality, bleeding related to Depo-Provera. Plan to f/u within the week. MDM: Moderate.

## 2013-02-20 NOTE — Assessment & Plan Note (Addendum)
A: Uncertain cause; thyroid abnormality considered but TSH wnl. CBC shows Hb 11.3, but stable from previous and not frankly microcytic (though MCV is at the lower limit of normal). Could be caused by Depo-Provera, though pt states she has used Depo in the past without significant bleeding. Bleeding itself could be contributing to low energy, etc.  P: Plan to f/u closely for BP and to readdress bleeding. Counseled pt that Depo could be playing a role. Will strongly suggest continuing PNV and/or will consider starting Fe supplement. Red flags reviewed with pt for if/when to present to the MAU (s/s of systemic infection, worse/very heavy bleeding, etc).

## 2013-02-20 NOTE — Assessment & Plan Note (Signed)
A: Pt appears to be adjusting relatively well to new baby and is appropriately concerned/interested/invested in baby's care, and has support from her husband. Per Dr. Jamie Kato note 5/12, baby appears well and is growing appropriately. Some ?limited insight given pt's statement of not coming in due to "not wanting to hear anything bad." Hx of depression may be exacerbating/underlying some current symptoms, and BP and vaginal bleeding are definitely issues that need to be addressed.  P: Discussed general safety/basic anticipatory guidance for baby, including emergency care, safe sleep, feeding. Plan to f/u closely for BP control and to evaluate vaginal bleeding further, as well as to evaluate for worsening/new depression. Stressed the importance of close f/u and calling/coming to clinic if either other providers (e.g.., home nurse) or she herself identifies a problem/concern for herself or her baby/other children. Pt currently on Depo-Provera; will need continued discussions about other birth control methods per her preference, particularly if Depo may be contributing to vaginal bleeding. See also other problem list notes.

## 2013-02-20 NOTE — Assessment & Plan Note (Addendum)
A: Uncertain if this represents chronic hypertension (?previously subclinical/intermittent and not obvious at regular follow-up prior to pregnancy) but seems less likely true post-partum pre-eclampsia given ~8 weeks since delivery. CMP and CBC grossly unremarkable; Hb only 11.3, but stable from prior.  P: Started on lisinopril 20 mg. Will consider repeat BMP within the next 1-2 weeks. Red flags reviewed that would prompt immediate presentation to MAU or emergency room (worse headaches, continued/worse change in vision, worse/new swelling, new neurologic symptoms, etc, particularly after medication is started). Plan to follow up closely and will stress the importance of f/u with pt's PCP Dr. Gwenlyn Saran even once BP is under control. Should probably consider switching to another medication, if different birth control method is chosen and/or if Depo-Provera is discontinued due to concerns over vaginal bleeding.

## 2013-02-21 ENCOUNTER — Ambulatory Visit: Payer: Medicaid Other | Admitting: Family Medicine

## 2013-02-25 ENCOUNTER — Encounter: Payer: Self-pay | Admitting: Family Medicine

## 2013-02-25 ENCOUNTER — Ambulatory Visit (INDEPENDENT_AMBULATORY_CARE_PROVIDER_SITE_OTHER): Payer: Medicaid Other | Admitting: Family Medicine

## 2013-02-25 VITALS — BP 135/91 | HR 80 | Ht 64.0 in | Wt 190.0 lb

## 2013-02-25 DIAGNOSIS — R51 Headache: Secondary | ICD-10-CM

## 2013-02-25 DIAGNOSIS — R5381 Other malaise: Secondary | ICD-10-CM

## 2013-02-25 DIAGNOSIS — D509 Iron deficiency anemia, unspecified: Secondary | ICD-10-CM | POA: Insufficient documentation

## 2013-02-25 DIAGNOSIS — N898 Other specified noninflammatory disorders of vagina: Secondary | ICD-10-CM

## 2013-02-25 DIAGNOSIS — R519 Headache, unspecified: Secondary | ICD-10-CM | POA: Insufficient documentation

## 2013-02-25 DIAGNOSIS — N939 Abnormal uterine and vaginal bleeding, unspecified: Secondary | ICD-10-CM

## 2013-02-25 DIAGNOSIS — R5383 Other fatigue: Secondary | ICD-10-CM

## 2013-02-25 DIAGNOSIS — I10 Essential (primary) hypertension: Secondary | ICD-10-CM

## 2013-02-25 MED ORDER — SUMATRIPTAN SUCCINATE 50 MG PO TABS: ORAL_TABLET | ORAL | Status: DC

## 2013-02-25 MED ORDER — HYDROCHLOROTHIAZIDE 12.5 MG PO CAPS: 12.5000 mg | ORAL_CAPSULE | Freq: Every day | ORAL | Status: DC

## 2013-02-25 MED ORDER — FERROUS SULFATE 325 (65 FE) MG PO TBEC: 325.0000 mg | DELAYED_RELEASE_TABLET | Freq: Two times a day (BID) | ORAL | Status: DC

## 2013-02-25 NOTE — Assessment & Plan Note (Signed)
Presumptive diagnosis based on Hb 11.3 and MCV at lower limit of normal, particularly in the setting of active vaginal bleeding (?secondary to Depo-Provera vs other pathology). Rx for ferrous sulfate today. Would recheck CBC in 6-8 weeks.

## 2013-02-25 NOTE — Assessment & Plan Note (Signed)
A: Description of headaches seems consistent with migraines. Pt slightly vague about ibuprofen and ASA use, but does not appear to be medication overuse. Likely component of blood pressure, though control is improving with lisinopril and HCTZ added today. Underlying pathology linking BP, headache, and vaginal bleeding remains possible.  P: Rx provided for sumatriptan.  -Red flags and adverse effects reviewed, including chest pain/pressure, SOB, worse/rebound headache, and including reasons to return promptly to care. -Reviewed sig specifically with pt (sumatriptan 50 mg once for headache, 50 mg in two hours if no resolution, no more than 100 mg in 12 hour period, no more than 200 mg daily) -If medication helps but pt requires many days of therapy out of a given month, would consider adding prophylactic migraine medication (?propranolol, given BP)

## 2013-02-25 NOTE — Patient Instructions (Signed)
Thank you for coming in, today! Your recent labs were all normal, except for the iron in your blood (which was low). For your blood pressure:  I think the medication we started is helping (lisinopril).  I am adding another medication that will work with the lisinopril called HCTZ.  Your kidney function was good before we started these medicines.  I want to recheck your kidney function in about a week, then see you again in about 2 weeks. For your headache:  I think these are migraines. I will prescribe a medicine for you take called Sumatriptan.  Take 1 pill when you start a headache. If you don't have improvement in 2 hours, take a second pill.  Do not take more than 2 pills in 12 hours. Do not take more than 4 pills in one day.  If you have any chest pain or tightness, shortness of breath, or dizziness, STOP taking the medicine.  If you have any of those symptoms, call the clinic or go to the emergency room. For your vaginal bleeding:  I think this is probably due to the Depo shots. You may need to reconsider a different birth control in the future.  If the bleeding keeps getting heavier, we may do some more studies.  Your iron is low due to the bleeding. I have prescribed you iron to take twice per day.  We will recheck your blood in about 2 months to see if the iron helps. I will write you a letter to keep you out of work this week. Please feel free to call with any questions or concerns at any time, at [phone]. --Dr. Sharmon Revere desk: Please make a lab appointment for patient in 1 week, and a follow-up appointment in 2 weeks.  Follow up with Dr. Casper Harrison or Dr. Gwenlyn Saran

## 2013-02-25 NOTE — Assessment & Plan Note (Signed)
A: Continues, though perhaps some improvement with improving BP control. Pt has numerous psychosocial reasons to be tired, including stress of having two young children in addition to a now-66-week-old infant. TSH normal, though CBC last week consistent with iron deficiency, likely secondary to vaginal bleeding.  P: Rx for ferrous sulfate provided today. Would repeat CBC in 6-8 weeks. Pt to f/u for BP and headaches prior to that; would continue to assess symptoms along with control of those problems, as well as to counsel/support pt through family stressors and child/infant care.

## 2013-02-25 NOTE — Assessment & Plan Note (Signed)
A: Unchanged to slightly worse since last week; present since delivery of now-48-week-old female infant. Etiology remains unclear, but working diagnosis is secondary to Depo-Provera. Underlying pathology linking BP, headaches, and bleeding also possible. DDx also includes uterine pathology such as fibroids. CMP and TSH all normal, and CBC significant only for mildly low Hb.  P: Pt following up closely for BP control and headaches; will continue to monitor. Red flags reiterated with pt that would indicated prompt presentation to [facility] MAU. Rx for ferrous sulfate for presumed secondary iron deficiency anemia (see other problem list notes from this date). Will continue to counsel pt on birth control methods given that Depo may be playing a role; if a different method is chosen, would need to consider change in BP medication (recently started on lisinopril; now on ACE and HCTZ added today).

## 2013-02-25 NOTE — Assessment & Plan Note (Signed)
A: Blood pressure improved but still elevated. BP likely contributing to headaches. Possibility remains that BP and headaches both are hormonally related to vaginal bleeding, and/or an underlying process is ongoing, such as intracranial pathology; however, with normal neurological exam and given that pt is now 8 weeks out from delivery, this seems less likely. Pt is concerned that her BP is "not improving quickly enough" since starting lisinopril.  P: Pt counseled that her BP is improved but not yet at goal, and that she started only a moderate dose of only one medication (gave pt the example that some people take 2 or 3 mediations for BP or more).  -Continue lisinopril 20 mg daily. Added HCTZ 12.5 mg today.  -Pt to f/u next week for repeat BMP, then to f/u with me or Dr. Gwenlyn Saran about a week after that. Reiterated red flags with pt.  -Again, given pt's age and possibility of changing birth control methods, would need to consider change of agent away from an ACE in the future. -Will discuss with Dr. Gwenlyn Saran and defer to her management in the long term.

## 2013-02-25 NOTE — Progress Notes (Signed)
  Subjective:    Patient ID: Christine Cervantes, female    DOB: 04/15/1986, 27 y.o.   MRN: [id number]  HPI: Pt presents to clinic for f/u on continued elevated BP, headaches.  BP: Pt relieved to see some improvement in BP today (see exam below) but concerned that it is "not changing very quickly." -Continues to endorse of intermittent blurriness of vision, unchanged. Still with some "off and on numbness" in her upper extremities the attributes to her BP -No chest pain, palpitations, SOB; pt believes her LE swelling is slightly better -Pt states her main concern is controlling her BP "in order to keep from having a stroke or a heart attack"  Headache: Still present, as per her previous visit note. Pain is mostly in the back of her head, sometimes on the sides, dull/achey, occasionally sharp and into her neck. -Some photo-/phonophobia associated with her headaches. Blurriness of vision as above but no definite aura associated with the headache. Some improvement with rest. -Headaches have been changed little since starting BP medication. Present since delivery in March, or a week or two before, worse for the past few weeks. -Pt states "migraines run in the family." Pt has not previously been on any migraine medications. -Little to no improvement with occasional ibuprofen or ASA; last taken on Saturday.  -Pt states she uses NSAIDs/ASA only infrequently, "less than 3 days per week" because "they don't help."  Vaginal bleeding: Pt continuing to have vaginal bleeding daily, slightly heavier over the past week but otherwise little change. -Pt uses 4-6 tampons per day. Pt states she has "never bled this long" before -No associated vaginal pain, discharge, dysuria, or increased urinary urge/frequency -Pt reiterates that she was on Depo "years ago" and never had any bleeding with it. -Pt remains concerned that her bleeding could be "hormonal" and involved with her headaches and blood pressure  Reviewed recent  labs with pt. -Pt relieved to hear CMP and thyroid generally normal. -Pt states she thinks it "makes sense" that her Hb is slightly low on CBC, due to her bleeding, as above.  In addition to the above documentation, pt's PMH, surgical history, FH, and SH all reviewed and updated where appropriate in the EMR.  Review of Systems: As above. Otherwise full 12-system ROS reviewed and all negative.     Objective:   Physical Exam BP 135/91  Pulse 80  Ht 5\' 4"  (1.626 m)  Wt 190 lb (86.183 kg)  BMI 32.6 kg/m2 Gen: well-appearing female in NAD; appears tired but otherwise less uncomfortable than last week  Of note, pt's young daughter and new baby are present with her today HEENT: [place]/AT, sclerae clear with improved icterus compared to last week; no conjunctival injection, no lid lag, EOMI, PERRLA  Optic fundi without gross papilledema or hemorrhage, but exam limited due to pt cooperation (left eye in particular not as well-visualized as right)  MMM, posterior oropharynx clear, neck supple with full ROM, no masses appreciated; thyroid not enlarged Cardio: RRR, no murmur appreciated; distal pulses intact  Pulm: CTAB, no wheezes, normal WOB Abd: soft, nondistended, BS+  Ext: mild non-pitting LE improved from previous visit; otherwise warm, well-perfused MSK: grip strength and both bilateral upper and lower extremities all with 5/5 strength Neuro/Psych: alert/oriented, sensation grossly intact  Appears tired as above, but less acutely anxious than previous visit, affect more reactive     Assessment & Plan:

## 2013-03-04 ENCOUNTER — Other Ambulatory Visit: Payer: Medicaid Other

## 2013-03-04 DIAGNOSIS — O2690 Pregnancy related conditions, unspecified, unspecified trimester: Secondary | ICD-10-CM

## 2013-03-04 NOTE — Progress Notes (Signed)
OB LABS DONE TODAY Christine Cervantes 

## 2013-03-05 LAB — OBSTETRIC PANEL
Antibody Screen: NEGATIVE
Basophils Absolute: 0 10*3/uL (ref 0.0–0.1)
Eosinophils Absolute: 0.1 10*3/uL (ref 0.0–0.7)
Eosinophils Relative: 4 % (ref 0–5)
MCH: 25.8 pg — ABNORMAL LOW (ref 26.0–34.0)
MCHC: 32.9 g/dL (ref 30.0–36.0)
MCV: 78.5 fL (ref 78.0–100.0)
Monocytes Absolute: 0.3 10*3/uL (ref 0.1–1.0)
Platelets: 260 10*3/uL (ref 150–400)
RDW: 14.5 % (ref 11.5–15.5)

## 2013-03-05 LAB — SICKLE CELL SCREEN: Sickle Cell Screen: NEGATIVE

## 2013-03-06 ENCOUNTER — Other Ambulatory Visit: Payer: Medicaid Other

## 2013-03-06 LAB — CULTURE, OB URINE
Colony Count: NO GROWTH
Organism ID, Bacteria: NO GROWTH

## 2013-03-10 ENCOUNTER — Ambulatory Visit: Payer: Medicaid Other | Admitting: Family Medicine

## 2013-03-11 ENCOUNTER — Encounter: Payer: Self-pay | Admitting: Family Medicine

## 2013-03-11 ENCOUNTER — Ambulatory Visit (INDEPENDENT_AMBULATORY_CARE_PROVIDER_SITE_OTHER): Payer: Medicaid Other | Admitting: Family Medicine

## 2013-03-11 VITALS — BP 126/84 | HR 88 | Temp 98.8°F | Ht 64.0 in | Wt 182.0 lb

## 2013-03-11 DIAGNOSIS — R05 Cough: Secondary | ICD-10-CM | POA: Insufficient documentation

## 2013-03-11 DIAGNOSIS — R51 Headache: Secondary | ICD-10-CM

## 2013-03-11 DIAGNOSIS — I10 Essential (primary) hypertension: Secondary | ICD-10-CM

## 2013-03-11 DIAGNOSIS — F4321 Adjustment disorder with depressed mood: Secondary | ICD-10-CM

## 2013-03-11 NOTE — Assessment & Plan Note (Signed)
well controlled  

## 2013-03-11 NOTE — Assessment & Plan Note (Signed)
Concern for ACE related. To stop Lisinopril if more throat tightening

## 2013-03-11 NOTE — Assessment & Plan Note (Signed)
Improved without taking Sumatriptan

## 2013-03-11 NOTE — Progress Notes (Signed)
  Subjective:    Patient ID: Christine Cervantes, female    DOB: [birth year], 27 y.o.   MRN: [id number]  HPI Hypertension - HCTZ was added last visit and she feels better.   Cough - She's  has had this and some mild tightness in her throat after taking her meds, but no tongue or lip swelling.   headache - her headaches have resolved since HCTZ started. Never got the Sumatriptan.   Back and hip pain - slipped on water and fell on her left hip. Still sore there and low back since.    Review of Systems     Objective:   Physical Exam  HENT:  Mouth/Throat: Oropharynx is clear and moist. No oropharyngeal exudate.  Neck: No thyromegaly present.  Cardiovascular: Normal rate and regular rhythm.   No murmur heard. Pulmonary/Chest: Effort normal and breath sounds normal. She has no rales.  Musculoskeletal: Normal range of motion.  Hip and SI tests negative.  Lymphadenopathy:    She has no cervical adenopathy.          Assessment & Plan:

## 2013-03-11 NOTE — Patient Instructions (Addendum)
Return in one month to see Dr Gwenlyn Saran  Try Loratadine 10 mg one daily for allergies  If the tightness in your throat worsens or if you get swelling of your tongue or lips, stop taking the Lisinopril immediately and call the office for further instructions.

## 2013-03-11 NOTE — Assessment & Plan Note (Signed)
Feeling better.

## 2013-03-22 ENCOUNTER — Telehealth: Payer: Self-pay | Admitting: Family Medicine

## 2013-03-22 NOTE — Telephone Encounter (Signed)
Received call from patient that her blood pressure was running high over the past few days.  Checked today and bp was 150/119.  She has also had a headache.  She denies blurred vision, weakness, chest pain.  Advised that she can take an additional HCTZ for now and follow up at our clinic.  Given warning signs that would prompt her to go to the ED including worsening headache, vision changes, chest pain, shortness of breath.

## 2013-08-09 ENCOUNTER — Telehealth: Payer: Self-pay | Admitting: Family Medicine

## 2013-08-09 NOTE — Telephone Encounter (Signed)
Emergency Line / After Hours Call  Patient called the emergency line. She has been having a lot of vaginal discharge and blood for the past 2 weeks. Has not had a normal menstrual cycle. Had a baby in March and has been on depo, but did not get her last shot when she was supposed to come get it. Denies having any abdominal pain or fevers. She has felt somewhat dizzy and lightheaded. As she reports she has been bleeding a lot and is dizzy/lightheaded, I recommended to her that she go to Urgent Care to be evaluated today. Pt understood this recommendation.  Levert Feinstein, MD Family Medicine PGY-2

## 2013-10-27 ENCOUNTER — Ambulatory Visit: Payer: Medicaid Other | Admitting: Family Medicine

## 2014-05-20 ENCOUNTER — Ambulatory Visit: Payer: Medicaid Other

## 2014-08-10 ENCOUNTER — Encounter: Payer: Self-pay | Admitting: Family Medicine

## 2014-10-19 ENCOUNTER — Encounter (HOSPITAL_COMMUNITY): Payer: Self-pay | Admitting: *Deleted

## 2014-10-19 ENCOUNTER — Inpatient Hospital Stay (HOSPITAL_COMMUNITY)
Admission: AD | Admit: 2014-10-19 | Discharge: 2014-10-20 | Disposition: A | Discharge status: Home / Self Care | Payer: Self-pay | Source: Ambulatory Visit | Attending: Family Medicine | Admitting: Family Medicine

## 2014-10-19 DIAGNOSIS — O26891 Other specified pregnancy related conditions, first trimester: Secondary | ICD-10-CM

## 2014-10-19 DIAGNOSIS — M545 Low back pain: Secondary | ICD-10-CM | POA: Insufficient documentation

## 2014-10-19 DIAGNOSIS — N949 Unspecified condition associated with female genital organs and menstrual cycle: Secondary | ICD-10-CM | POA: Insufficient documentation

## 2014-10-19 DIAGNOSIS — R102 Pelvic and perineal pain: Secondary | ICD-10-CM

## 2014-10-19 DIAGNOSIS — N912 Amenorrhea, unspecified: Secondary | ICD-10-CM | POA: Insufficient documentation

## 2014-10-19 DIAGNOSIS — Z3201 Encounter for pregnancy test, result positive: Secondary | ICD-10-CM | POA: Insufficient documentation

## 2014-10-19 LAB — URINE MICROSCOPIC-ADD ON

## 2014-10-19 LAB — URINALYSIS, ROUTINE W REFLEX MICROSCOPIC
Bilirubin Urine: NEGATIVE
Glucose, UA: NEGATIVE mg/dL
KETONES UR: NEGATIVE mg/dL
Nitrite: NEGATIVE
PH: 6 (ref 5.0–8.0)
PROTEIN: NEGATIVE mg/dL
Specific Gravity, Urine: >1.03 — ABNORMAL HIGH (ref 1.005–1.030)
Urobilinogen, UA: 1 mg/dL (ref 0.0–1.0)

## 2014-10-19 LAB — CBC
HCT: 35.6 % — ABNORMAL LOW (ref 36.0–46.0)
Hemoglobin: 11.4 g/dL — ABNORMAL LOW (ref 12.0–15.0)
MCH: 26.5 pg (ref 26.0–34.0)
MCHC: 32 g/dL (ref 30.0–36.0)
MCV: 82.8 fL (ref 78.0–100.0)
PLATELETS: 211 10*3/uL (ref 150–400)
RBC: 4.3 MIL/uL (ref 3.87–5.11)
RDW: 13.4 % (ref 11.5–15.5)
WBC: 6.5 10*3/uL (ref 4.0–10.5)

## 2014-10-19 LAB — POCT PREGNANCY, URINE: PREG TEST UR: POSITIVE — AB

## 2014-10-19 NOTE — MAU Note (Signed)
Pt reports LMP 11/25, has not had a preg test but states she feels nauseated and tired. Has had spotting off/on.

## 2014-10-19 NOTE — MAU Provider Note (Signed)
History     CSN: [id number]  Arrival date and time: 10/19/14 2253   First Provider Initiated Contact with Patient 10/19/14 2339      Chief Complaint  Patient presents with  . Abdominal Pain  . Possible Pregnancy   HPI   Christine Cervantes is a 29 y.o. [phone] who presents with amenorrhea, back pain, abdominal pain and vaginal spotting. LMP 11/25, no birth control, hasn't taken pregnancy test. Low back pain since mid December; rates 8/10. Describes pain as an ache that is worse with activity and relieved with rest; no meds.  Lower achy abdominal pain x 1 week. Rates 4/10. No meds. No alleviating or aggravating factors.  Pink vaginal spotting on toilet paper intermittently since last week.  Reports some nausea, denies vomiting/diarrhea/constipation.      OB History    Gravida Para Term Preterm AB TAB SAB Ectopic Multiple Living   5 3 2 1 1  1   3       Past Medical History  Diagnosis Date  . Chlamydia   . Trichimoniasis   . Gonorrhea   . Pregnancy induced hypertension   . Preterm labor   . Urinary tract infection   . Abnormal Pap smear     cryo on cervix  . Depression   . Headache(784.0)   . Anemia     Past Surgical History  Procedure Laterality Date  . Foot surgery  2004-2004    corns removed from both feet, hammertoes repaired  . Gynecologic cryosurgery      Family History  Problem Relation Age of Onset  . Hypertension Mother   . Hypertension Maternal Aunt   . Hypertension Maternal Grandmother   . Anesthesia problems Neg Hx   . Other Neg Hx   . Diabetes Paternal Grandmother     History  Substance Use Topics  . Smoking status: Never Smoker   . Smokeless tobacco: Never Used  . Alcohol Use: No     Comment: occ before (+) UPT    Allergies: No Known Allergies  Prescriptions prior to admission  Medication Sig Dispense Refill Last Dose  . ferrous sulfate 325 (65 FE) MG EC tablet Take 1 tablet (325 mg total) by mouth 2 (two) times daily with a meal. 60  tablet 2 More than a month at Unknown time  . flintstones complete (FLINTSTONES) 60 MG chewable tablet Chew 2 tablets by mouth daily.   More than a month at Unknown time  . hydrochlorothiazide (MICROZIDE) 12.5 MG capsule Take 1 capsule (12.5 mg total) by mouth daily. 30 capsule 1 More than a month at Unknown time  . ibuprofen (ADVIL,MOTRIN) 600 MG tablet Take 1 tablet (600 mg total) by mouth every 6 (six) hours. 30 tablet 1 More than a month at Unknown time  . lisinopril (PRINIVIL,ZESTRIL) 20 MG tablet Take 1 tablet (20 mg total) by mouth daily. 90 tablet 3 More than a month at Unknown time  . medroxyPROGESTERone (DEPO-PROVERA) 150 MG/ML injection Inject 1 mL (150 mg total) into the muscle every 3 (three) months. 1 mL 4 Taking  . SUMAtriptan (IMITREX) 50 MG tablet Take 1 tablet for headache. If no improvement in 2 hours, take a second tablet. Take NO MORE than 2 tablets within 12 hours. No more than 4 tablets in one day. 20 tablet 0 Not Taking    Review of Systems  Constitutional: Negative.   Cardiovascular: Negative.   Gastrointestinal: Positive for nausea and abdominal pain. Negative for vomiting, diarrhea and constipation.  Genitourinary: Positive for dysuria. Negative for urgency and frequency.       Positive for vaginal bleeding/pink spotting.  Negative for vaginal discharge.   Musculoskeletal: Positive for back pain.   Physical Exam   Blood pressure 151/81, pulse 71, temperature 98.8 F (37.1 C), temperature source Oral, resp. rate 18, height  (1.626 m), weight 207 lb (93.895 kg), last menstrual period 09/02/2014, SpO2 100 %.  Physical Exam  Constitutional: She is oriented to person, place, and time. She appears well-developed and well-nourished. No distress.  HENT:  Head: Normocephalic and atraumatic.  Cardiovascular: Normal rate, regular rhythm and normal heart sounds.   No murmur heard. Respiratory: Effort normal and breath sounds normal. No respiratory distress. She has no  wheezes.  GI: Soft. She exhibits no distension and no mass. There is tenderness (LLQ tenderness with bimanual exam). There is no rebound.  Genitourinary: Uterus normal. Uterus is not tender. Cervix exhibits discharge (small amount of thin white discharge). Cervix exhibits no motion tenderness and no friability. Right adnexum displays no mass, no tenderness and no fullness. Left adnexum displays tenderness. Left adnexum displays no mass and no fullness. Vaginal discharge found.  Neurological: She is alert and oriented to person, place, and time.  Skin: Skin is warm and dry. She is not diaphoretic.  Psychiatric: She has a normal mood and affect. Her behavior is normal.   Results for orders placed or performed during the hospital encounter of 10/19/14 (from the past 24 hour(s))  Urinalysis, Routine w reflex microscopic     Status: Abnormal   Collection Time: 10/19/14 11:17 PM  Result Value Ref Range   Color, Urine YELLOW YELLOW   APPearance CLEAR CLEAR   Specific Gravity, Urine >1.030 (H) 1.005 - 1.030   pH 6.0 5.0 - 8.0   Glucose, UA NEGATIVE NEGATIVE mg/dL   Hgb urine dipstick TRACE (A) NEGATIVE   Bilirubin Urine NEGATIVE NEGATIVE   Ketones, ur NEGATIVE NEGATIVE mg/dL   Protein, ur NEGATIVE NEGATIVE mg/dL   Urobilinogen, UA 1.0 0.0 - 1.0 mg/dL   Nitrite NEGATIVE NEGATIVE   Leukocytes, UA TRACE (A) NEGATIVE  Urine microscopic-add on     Status: Abnormal   Collection Time: 10/19/14 11:17 PM  Result Value Ref Range   Squamous Epithelial / LPF RARE RARE   WBC, UA 3-6 <3 WBC/hpf   Bacteria, UA FEW (A) RARE   Urine-Other MUCOUS PRESENT   Pregnancy, urine POC     Status: Abnormal   Collection Time: 10/19/14 11:26 PM  Result Value Ref Range   Preg Test, Ur POSITIVE (A) NEGATIVE  CBC     Status: Abnormal   Collection Time: 10/19/14 11:39 PM  Result Value Ref Range   WBC 6.5 4.0 - 10.5 K/uL   RBC 4.30 3.87 - 5.11 MIL/uL   Hemoglobin 11.4 (L) 12.0 - 15.0 g/dL   HCT 16.1 (L) 09.6 - 04.5 %    MCV 82.8 78.0 - 100.0 fL   MCH 26.5 26.0 - 34.0 pg   MCHC 32.0 30.0 - 36.0 g/dL   RDW 40.9 81.1 - 91.4 %   Platelets 211 150 - 400 K/uL  hCG, quantitative, pregnancy     Status: Abnormal   Collection Time: 10/19/14 11:39 PM  Result Value Ref Range   hCG, Beta Chain, Quant, S 43813 (H) <5 mIU/mL  Wet prep, genital     Status: Abnormal   Collection Time: 10/19/14 11:50 PM  Result Value Ref Range   Yeast Wet Prep HPF POC  NONE SEEN NONE SEEN   Trich, Wet Prep NONE SEEN NONE SEEN   Clue Cells Wet Prep HPF POC NONE SEEN NONE SEEN   WBC, Wet Prep HPF POC FEW (A) NONE SEEN   US Ob Comp Less 14 Wks  10/20/2014   CLINICAL DATA:  Acute onset of pelvic pain and vaginal spotting. Initial encounter.  EXAM: OBSTETRIC <14 WK [place] AND TRANSVAGINAL OB US  TECHNIQUE: Both transabdominal and transvaginal ultrasound examinations were performed for complete evaluation of the gestation as well as the maternal uterus, adnexal regions, and pelvic cul-de-sac. Transvaginal technique was performed to assess early pregnancy.  COMPARISON:  Pelvic ultrasound performed 11/18/2012  FINDINGS: Intrauterine gestational sac: Visualized/normal in shape.  Yolk sac:  Yes  Embryo:  Yes  Cardiac Activity: Yes  Heart Rate: 104 bpm  CRL:   3.8 mm   6 w 1 d                  [place] EDC: 06/14/2015  Maternal uterus/adnexae: A small to moderate amount of subchorionic hemorrhage is noted. The uterus is otherwise unremarkable.  The ovaries are within normal limits. The right ovary measures 3.6 x 1.7 x 3.2 cm, while the left ovary measures 2.3 x 1.0 x 2.2 cm. No suspicious adnexal masses are seen; there is no evidence for ovarian torsion.  Trace free fluid is seen within the pelvic cul-de-sac.  IMPRESSION: 1. Single live intrauterine pregnancy noted, with a crown-rump length of 4 mm, corresponding to a gestational age of [redacted] weeks 1 day. This matches the gestational age of [redacted] weeks 6 days by LMP, reflecting an estimated date of delivery of June 09, 2015. 2. Small to moderate amount of subchorionic hemorrhage noted.   Electronically Signed   By: Roanna Raider M.D.   On: 10/20/2014 02:17   US Ob Transvaginal  10/20/2014   CLINICAL DATA:  Acute onset of pelvic pain and vaginal spotting. Initial encounter.  EXAM: OBSTETRIC <14 WK [place] AND TRANSVAGINAL OB US  TECHNIQUE: Both transabdominal and transvaginal ultrasound examinations were performed for complete evaluation of the gestation as well as the maternal uterus, adnexal regions, and pelvic cul-de-sac. Transvaginal technique was performed to assess early pregnancy.  COMPARISON:  Pelvic ultrasound performed 11/18/2012  FINDINGS: Intrauterine gestational sac: Visualized/normal in shape.  Yolk sac:  Yes  Embryo:  Yes  Cardiac Activity: Yes  Heart Rate: 104 bpm  CRL:   3.8 mm   6 w 1 d                  [place] EDC: 06/14/2015  Maternal uterus/adnexae: A small to moderate amount of subchorionic hemorrhage is noted. The uterus is otherwise unremarkable.  The ovaries are within normal limits. The right ovary measures 3.6 x 1.7 x 3.2 cm, while the left ovary measures 2.3 x 1.0 x 2.2 cm. No suspicious adnexal masses are seen; there is no evidence for ovarian torsion.  Trace free fluid is seen within the pelvic cul-de-sac.  IMPRESSION: 1. Single live intrauterine pregnancy noted, with a crown-rump length of 4 mm, corresponding to a gestational age of [redacted] weeks 1 day. This matches the gestational age of [redacted] weeks 6 days by LMP, reflecting an estimated date of delivery of June 09, 2015. 2. Small to moderate amount of subchorionic hemorrhage noted.   Electronically Signed   By: Roanna Raider M.D.   On: 10/20/2014 02:17     MAU Course  Procedures  MDM Positive UPT - [redacted]w[redacted]d  based on LMP Declines pain medication in MAU U/s: SIUP with cardiac activity Wet prep negative  Assessment and Plan   1. Pelvic pain affecting pregnancy in first trimester, antepartum     Plan: Discharge to home [name] Discussed reasons to return to [facility]  Judeth HornLawrence, Erin B 10/19/2014, 11:44 PM   I have seen the patient with the resident/student and agree with the above.  Tawnya CrookHogan, Heather Donovan

## 2014-10-20 ENCOUNTER — Inpatient Hospital Stay (HOSPITAL_COMMUNITY): Payer: Medicaid Other

## 2014-10-20 DIAGNOSIS — R102 Pelvic and perineal pain: Secondary | ICD-10-CM

## 2014-10-20 DIAGNOSIS — O26891 Other specified pregnancy related conditions, first trimester: Secondary | ICD-10-CM

## 2014-10-20 LAB — GC/CHLAMYDIA PROBE AMP
CT Probe RNA: NEGATIVE
GC PROBE AMP APTIMA: NEGATIVE

## 2014-10-20 LAB — HIV ANTIBODY (ROUTINE TESTING W REFLEX)
HIV 1/O/2 Abs-Index Value: <1 (ref <1.00)
HIV-1/HIV-2 Ab: NONREACTIVE

## 2014-10-20 LAB — WET PREP, GENITAL
Clue Cells Wet Prep HPF POC: NONE SEEN
Trich, Wet Prep: NONE SEEN
Yeast Wet Prep HPF POC: NONE SEEN

## 2014-10-20 LAB — HCG, QUANTITATIVE, PREGNANCY: HCG, BETA CHAIN, QUANT, S: 43813 m[IU]/mL — AB (ref <5)

## 2014-10-20 NOTE — Discharge Instructions (Signed)
First Trimester of Pregnancy The first trimester of pregnancy is from week 1 until the end of week 12 (months 1 through 3). A week after a sperm fertilizes an egg, the egg will implant on the wall of the uterus. This embryo will begin to develop into a baby. Genes from you and your partner are forming the baby. The female genes determine whether the baby is a boy or a girl. At 6-8 weeks, the eyes and face are formed, and the heartbeat can be seen on ultrasound. At the end of 12 weeks, all the baby's organs are formed.  Now that you are pregnant, you will want to do everything you can to have a healthy baby. Two of the most important things are to get good prenatal care and to follow your health care provider's instructions. Prenatal care is all the medical care you receive before the baby's birth. This care will help prevent, find, and treat any problems during the pregnancy and childbirth. BODY CHANGES Your body goes through many changes during pregnancy. The changes vary from woman to woman.   You may gain or lose a couple of pounds at first.  You may feel sick to your stomach (nauseous) and throw up (vomit). If the vomiting is uncontrollable, call your health care provider.  You may tire easily.  You may develop headaches that can be relieved by medicines approved by your health care provider.  You may urinate more often. Painful urination may mean you have a bladder infection.  You may develop heartburn as a result of your pregnancy.  You may develop constipation because certain hormones are causing the muscles that push waste through your intestines to slow down.  You may develop hemorrhoids or swollen, bulging veins (varicose veins).  Your breasts may begin to grow larger and become tender. Your nipples may stick out more, and the tissue that surrounds them (areola) may become darker.  Your gums may bleed and may be sensitive to brushing and flossing.  Dark spots or blotches (chloasma,  mask of pregnancy) may develop on your face. This will likely fade after the baby is born.  Your menstrual periods will stop.  You may have a loss of appetite.  You may develop cravings for certain kinds of food.  You may have changes in your emotions from day to day, such as being excited to be pregnant or being concerned that something may go wrong with the pregnancy and baby.  You may have more vivid and strange dreams.  You may have changes in your hair. These can include thickening of your hair, rapid growth, and changes in texture. Some women also have hair loss during or after pregnancy, or hair that feels dry or thin. Your hair will most likely return to normal after your baby is born. WHAT TO EXPECT AT YOUR PRENATAL VISITS During a routine prenatal visit:  You will be weighed to make sure you and the baby are growing normally.  Your blood pressure will be taken.  Your abdomen will be measured to track your baby's growth.  The fetal heartbeat will be listened to starting around week 10 or 12 of your pregnancy.  Test results from any previous visits will be discussed. Your health care provider may ask you:  How you are feeling.  If you are feeling the baby move.  If you have had any abnormal symptoms, such as leaking fluid, bleeding, severe headaches, or abdominal cramping.  If you have any questions. Other tests   that may be performed during your first trimester include:  Blood tests to find your blood type and to check for the presence of any previous infections. They will also be used to check for low iron levels (anemia) and Rh antibodies. Later in the pregnancy, blood tests for diabetes will be done along with other tests if problems develop.  Urine tests to check for infections, diabetes, or protein in the urine.  An ultrasound to confirm the proper growth and development of the baby.  An amniocentesis to check for possible genetic problems.  Fetal screens for  spina bifida and Down syndrome.  You may need other tests to make sure you and the baby are doing well. HOME CARE INSTRUCTIONS  Medicines  Follow your health care provider's instructions regarding medicine use. Specific medicines may be either safe or unsafe to take during pregnancy.  Take your prenatal vitamins as directed.  If you develop constipation, try taking a stool softener if your health care provider approves. Diet  Eat regular, well-balanced meals. Choose a variety of foods, such as meat or vegetable-based protein, fish, milk and low-fat dairy products, vegetables, fruits, and whole grain breads and cereals. Your health care provider will help you determine the amount of weight gain that is right for you.  Avoid raw meat and uncooked cheese. These carry germs that can cause birth defects in the baby.  Eating four or five small meals rather than three large meals a day may help relieve nausea and vomiting. If you start to feel nauseous, eating a few soda crackers can be helpful. Drinking liquids between meals instead of during meals also seems to help nausea and vomiting.  If you develop constipation, eat more high-fiber foods, such as fresh vegetables or fruit and whole grains. Drink enough fluids to keep your urine clear or pale yellow. Activity and Exercise  Exercise only as directed by your health care provider. Exercising will help you:  Control your weight.  Stay in shape.  Be prepared for labor and delivery.  Experiencing pain or cramping in the lower abdomen or low back is a good sign that you should stop exercising. Check with your health care provider before continuing normal exercises.  Try to avoid standing for long periods of time. Move your legs often if you must stand in one place for a long time.  Avoid heavy lifting.  Wear low-heeled shoes, and practice good posture.  You may continue to have sex unless your health care provider directs you  otherwise. Relief of Pain or Discomfort  Wear a good support bra for breast tenderness.   Take warm sitz baths to soothe any pain or discomfort caused by hemorrhoids. Use hemorrhoid cream if your health care provider approves.   Rest with your legs elevated if you have leg cramps or low back pain.  If you develop varicose veins in your legs, wear support hose. Elevate your feet for 15 minutes, 3-4 times a day. Limit salt in your diet. Prenatal Care  Schedule your prenatal visits by the twelfth week of pregnancy. They are usually scheduled monthly at first, then more often in the last 2 months before delivery.  Write down your questions. Take them to your prenatal visits.  Keep all your prenatal visits as directed by your health care provider. Safety  Wear your seat belt at all times when driving.  Make a list of emergency phone numbers, including numbers for family, friends, the hospital, and police and fire departments. General Tips    Ask your health care provider for a referral to a local prenatal education class. Begin classes no later than at the beginning of month 6 of your pregnancy.  Ask for help if you have counseling or nutritional needs during pregnancy. Your health care provider can offer advice or refer you to specialists for help with various needs.  Do not use hot tubs, steam rooms, or saunas.  Do not douche or use tampons or scented sanitary pads.  Do not cross your legs for long periods of time.  Avoid cat litter boxes and soil used by cats. These carry germs that can cause birth defects in the baby and possibly loss of the fetus by miscarriage or stillbirth.  Avoid all smoking, herbs, alcohol, and medicines not prescribed by your health care provider. Chemicals in these affect the formation and growth of the baby.  Schedule a dentist appointment. At home, brush your teeth with a soft toothbrush and be gentle when you floss. SEEK MEDICAL CARE IF:   You have  dizziness.  You have mild pelvic cramps, pelvic pressure, or nagging pain in the abdominal area.  You have persistent nausea, vomiting, or diarrhea.  You have a bad smelling vaginal discharge.  You have pain with urination.  You notice increased swelling in your face, hands, legs, or ankles. SEEK IMMEDIATE MEDICAL CARE IF:   You have a fever.  You are leaking fluid from your vagina.  You have spotting or bleeding from your vagina.  You have severe abdominal cramping or pain.  You have rapid weight gain or loss.  You vomit blood or material that looks like coffee grounds.  You are exposed to German measles and have never had them.  You are exposed to fifth disease or chickenpox.  You develop a severe headache.  You have shortness of breath.  You have any kind of trauma, such as from a fall or a car accident. Document Released: 09/19/2001 Document Revised: 02/09/2014 Document Reviewed: 08/05/2013 [employer] Patient Information 2015 [employer]. This information is not intended to replace advice given to you by your health care provider. Make sure you discuss any questions you have with your health care provider.  

## 2014-11-07 ENCOUNTER — Inpatient Hospital Stay (HOSPITAL_COMMUNITY)
Admission: AD | Admit: 2014-11-07 | Discharge: 2014-11-07 | Disposition: A | Discharge status: Home / Self Care | Payer: Self-pay | Source: Ambulatory Visit | Attending: Family Medicine | Admitting: Family Medicine

## 2014-11-07 ENCOUNTER — Encounter (HOSPITAL_COMMUNITY): Payer: Self-pay | Admitting: *Deleted

## 2014-11-07 ENCOUNTER — Inpatient Hospital Stay (HOSPITAL_COMMUNITY): Payer: Self-pay

## 2014-11-07 DIAGNOSIS — O210 Mild hyperemesis gravidarum: Secondary | ICD-10-CM | POA: Insufficient documentation

## 2014-11-07 DIAGNOSIS — B9689 Other specified bacterial agents as the cause of diseases classified elsewhere: Secondary | ICD-10-CM | POA: Insufficient documentation

## 2014-11-07 DIAGNOSIS — O23591 Infection of other part of genital tract in pregnancy, first trimester: Secondary | ICD-10-CM | POA: Insufficient documentation

## 2014-11-07 DIAGNOSIS — O209 Hemorrhage in early pregnancy, unspecified: Secondary | ICD-10-CM | POA: Insufficient documentation

## 2014-11-07 DIAGNOSIS — N760 Acute vaginitis: Secondary | ICD-10-CM | POA: Insufficient documentation

## 2014-11-07 DIAGNOSIS — O26851 Spotting complicating pregnancy, first trimester: Secondary | ICD-10-CM

## 2014-11-07 DIAGNOSIS — O219 Vomiting of pregnancy, unspecified: Secondary | ICD-10-CM

## 2014-11-07 DIAGNOSIS — Z3A09 9 weeks gestation of pregnancy: Secondary | ICD-10-CM | POA: Insufficient documentation

## 2014-11-07 LAB — CBC
HEMATOCRIT: 34.1 % — AB (ref 36.0–46.0)
Hemoglobin: 11.1 g/dL — ABNORMAL LOW (ref 12.0–15.0)
MCH: 26.5 pg (ref 26.0–34.0)
MCHC: 32.6 g/dL (ref 30.0–36.0)
MCV: 81.4 fL (ref 78.0–100.0)
Platelets: 193 10*3/uL (ref 150–400)
RBC: 4.19 MIL/uL (ref 3.87–5.11)
RDW: 12.8 % (ref 11.5–15.5)
WBC: 4.7 10*3/uL (ref 4.0–10.5)

## 2014-11-07 LAB — URINALYSIS, ROUTINE W REFLEX MICROSCOPIC
Bilirubin Urine: NEGATIVE
Glucose, UA: NEGATIVE mg/dL
HGB URINE DIPSTICK: NEGATIVE
Ketones, ur: NEGATIVE mg/dL
NITRITE: NEGATIVE
PROTEIN: 30 mg/dL — AB
SPECIFIC GRAVITY, URINE: 1.02 (ref 1.005–1.030)
UROBILINOGEN UA: 0.2 mg/dL (ref 0.0–1.0)
pH: 7 (ref 5.0–8.0)

## 2014-11-07 LAB — WET PREP, GENITAL
Trich, Wet Prep: NONE SEEN
YEAST WET PREP: NONE SEEN

## 2014-11-07 LAB — URINE MICROSCOPIC-ADD ON

## 2014-11-07 MED ORDER — METRONIDAZOLE 500 MG PO TABS: 500.0000 mg | ORAL_TABLET | Freq: Two times a day (BID) | ORAL | Status: DC

## 2014-11-07 NOTE — MAU Provider Note (Signed)
History     CSN: [id number]  Arrival date and time: 11/07/14 [id number]   First Provider Initiated Contact with Patient 11/07/14 1024      Chief Complaint  Patient presents with  . Vaginal Bleeding  . Emesis  . Abdominal Pain   HPI Christine Cervantes 29 y.o. [id number] @[redacted]w[redacted]d  presents to MAU complaining of vaginal bleeding with a quarter sized blood clot this morning.  Yesterday she had abdominal pain/cramping which was moderate.  She is also having vomiting once a day at bedtime.  She has had spotting throughout her pregnancy.  She denies fever, dysuria and complains of vaginal discharge and weakness.   OB History    Gravida Para Term Preterm AB TAB SAB Ectopic Multiple Living   5 3 2 1 1  1   3       Past Medical History  Diagnosis Date  . Chlamydia   . Trichimoniasis   . Gonorrhea   . Pregnancy induced hypertension   . Preterm labor   . Urinary tract infection   . Abnormal Pap smear     cryo on cervix  . Depression   . Headache(784.0)   . Anemia     Past Surgical History  Procedure Laterality Date  . Foot surgery  2004-2004    corns removed from both feet, hammertoes repaired  . Gynecologic cryosurgery      Family History  Problem Relation Age of Onset  . Hypertension Mother   . Hypertension Maternal Aunt   . Hypertension Maternal Grandmother   . Anesthesia problems Neg Hx   . Other Neg Hx   . Diabetes Paternal Grandmother     History  Substance Use Topics  . Smoking status: Never Smoker   . Smokeless tobacco: Never Used  . Alcohol Use: No     Comment: occ before (+) UPT    Allergies: No Known Allergies  Prescriptions prior to admission  Medication Sig Dispense Refill Last Dose  . ferrous sulfate 325 (65 FE) MG EC tablet Take 1 tablet (325 mg total) by mouth 2 (two) times daily with a meal. (Patient not taking: Reported on 11/07/2014) 60 tablet 2 More than a month at Unknown time  . hydrochlorothiazide (MICROZIDE) 12.5 MG capsule Take 1 capsule (12.5 mg total)  by mouth daily. (Patient not taking: Reported on 11/07/2014) 30 capsule 1 More than a month at Unknown time  . ibuprofen (ADVIL,MOTRIN) 600 MG tablet Take 1 tablet (600 mg total) by mouth every 6 (six) hours. (Patient not taking: Reported on 11/07/2014) 30 tablet 1 More than a month at Unknown time  . lisinopril (PRINIVIL,ZESTRIL) 20 MG tablet Take 1 tablet (20 mg total) by mouth daily. (Patient not taking: Reported on 11/07/2014) 90 tablet 3 More than a month at Unknown time  . medroxyPROGESTERone (DEPO-PROVERA) 150 MG/ML injection Inject 1 mL (150 mg total) into the muscle every 3 (three) months. (Patient not taking: Reported on 11/07/2014) 1 mL 4 Taking  . SUMAtriptan (IMITREX) 50 MG tablet Take 1 tablet for headache. If no improvement in 2 hours, take a second tablet. Take NO MORE than 2 tablets within 12 hours. No more than 4 tablets in one day. (Patient not taking: Reported on 11/07/2014) 20 tablet 0 Not Taking    ROS Pertinent ROS in HPI   Physical Exam   Blood pressure 139/78, pulse 81, temperature 98.8 F (37.1 C), temperature source Oral, resp. rate 18, height 5\' 6"  (1.676 m), weight 202 lb 4 oz (91.74 kg),  last menstrual period 09/02/2014.  Physical Exam  Constitutional: She is oriented to person, place, and time. She appears well-developed and well-nourished.  HENT:  Head: Normocephalic and atraumatic.  Eyes: EOM are normal.  Neck: Normal range of motion.  Cardiovascular: Normal rate, regular rhythm and normal heart sounds.   Respiratory: Effort normal and breath sounds normal. No respiratory distress.  GI: Soft. Bowel sounds are normal. She exhibits no distension. There is tenderness.  Mild TTP of LLQ  Genitourinary:  White vaginal discharge in vault.  NO signs of bleeding Cervix is closed.  NO CMT.  No adnexal mass or tenderness noted  Musculoskeletal: Normal range of motion.  Neurological: She is alert and oriented to person, place, and time.  Skin: Skin is warm and dry.   Psychiatric: She has a normal mood and affect.   Results for orders placed or performed during the hospital encounter of 11/07/14 (from the past 48 hour(s))  Urinalysis, Routine w reflex microscopic     Status: Abnormal   Collection Time: 11/07/14  9:50 AM  Result Value Ref Range   Color, Urine YELLOW YELLOW   APPearance CLEAR CLEAR   Specific Gravity, Urine 1.020 1.005 - 1.030   pH 7.0 5.0 - 8.0   Glucose, UA NEGATIVE NEGATIVE mg/dL   Hgb urine dipstick NEGATIVE NEGATIVE   Bilirubin Urine NEGATIVE NEGATIVE   Ketones, ur NEGATIVE NEGATIVE mg/dL   Protein, ur 30 (A) NEGATIVE mg/dL   Urobilinogen, UA 0.2 0.0 - 1.0 mg/dL   Nitrite NEGATIVE NEGATIVE   Leukocytes, UA TRACE (A) NEGATIVE  Urine microscopic-add on     Status: Abnormal   Collection Time: 11/07/14  9:50 AM  Result Value Ref Range   Squamous Epithelial / LPF FEW (A) RARE   WBC, UA 11-20 <3 WBC/hpf   RBC / HPF 0-2 <3 RBC/hpf   Bacteria, UA FEW (A) RARE  Wet prep, genital     Status: Abnormal   Collection Time: 11/07/14 10:35 AM  Result Value Ref Range   Yeast Wet Prep HPF POC NONE SEEN NONE SEEN   Trich, Wet Prep NONE SEEN NONE SEEN   Clue Cells Wet Prep HPF POC FEW (A) NONE SEEN   WBC, Wet Prep HPF POC FEW (A) NONE SEEN    Comment: MANY BACTERIA SEEN  CBC     Status: Abnormal   Collection Time: 11/07/14 10:46 AM  Result Value Ref Range   WBC 4.7 4.0 - 10.5 K/uL   RBC 4.19 3.87 - 5.11 MIL/uL   Hemoglobin 11.1 (L) 12.0 - 15.0 g/dL   HCT 30.8 (L) 65.7 - 84.6 %   MCV 81.4 78.0 - 100.0 fL   MCH 26.5 26.0 - 34.0 pg   MCHC 32.6 30.0 - 36.0 g/dL   RDW 96.2 95.2 - 84.1 %   Platelets 193 150 - 400 K/uL   US Ob Transvaginal  11/07/2014   CLINICAL DATA:  Vaginal bleeding, first trimester of pregnancy.  EXAM: TRANSVAGINAL OB ULTRASOUND  TECHNIQUE: Transvaginal ultrasound was performed for complete evaluation of the gestation as well as the maternal uterus, adnexal regions, and pelvic cul-de-sac.  COMPARISON:  Ultrasound of  October 20, 2014.  FINDINGS: Intrauterine gestational sac: Visualized/normal in shape.  Yolk sac:  Visualized.  Embryo:  Visualized.  Cardiac Activity: Visualized.  Heart Rate: 175 bpm  CRL:   22  mm   8 w 6 d  [place] EDC: June 13, 2015.  Maternal uterus/adnexae: No hemorrhage is noted. Ovaries appear normal. No free fluid is noted.  IMPRESSION: Single live intrauterine gestation of 8 weeks 6 days.   Electronically Signed   By: Roque Lias M.D.   On: 11/07/2014 11:43    MAU Course  Procedures  MDM Exam and wet prep consistent with BV. U/S shows appropriate growth of IUP with cardiac activity.  NO subchorionic hemorrhage seen today.    Assessment and Plan  A: vaginal bleeding in pregnancy, vomiting in pregnancy, Bacterial vaginosis  P: Discharge to home Obtain [facility] asap PNV qd Flagyl bid x 1 week.  No etoh/IC x 10 days Pelvic Rest Patient may return to MAU as needed or if her condition were to change or worsen    Bertram Denver 11/07/2014, 10:26 AM

## 2014-11-07 NOTE — Discharge Instructions (Signed)
Nausea medication to take during pregnancy:   Unisom (doxylamine succinate 25 mg tablets) Take one tablet daily at bedtime. If symptoms are not adequately controlled, the dose can be increased to a maximum recommended dose of two tablets daily (1/2 tablet in the morning, 1/2 tablet mid-afternoon and one at bedtime).  Vitamin B6  tablets. Take one tablet twice a day (up to 200 mg per day).  Morning Sickness Morning sickness is when you feel sick to your stomach (nauseous) during pregnancy. You may feel sick to your stomach and throw up (vomit). You may feel sick in the morning, but you can feel this way any time of day. Some women feel very sick to their stomach and cannot stop throwing up (hyperemesis gravidarum). HOME CARE  Only take medicines as told by your doctor.  Take multivitamins as told by your doctor. Taking multivitamins before getting pregnant can stop or lessen the harshness of morning sickness.  Eat dry toast or unsalted crackers before getting out of bed.  Eat 5 to 6 small meals a day.  Eat dry and bland foods like rice and baked potatoes.  Do not drink liquids with meals. Drink between meals.  Do not eat greasy, fatty, or spicy foods.  Have someone cook for you if the smell of food causes you to feel sick or throw up.  If you feel sick to your stomach after taking prenatal vitamins, take them at night or with a snack.  Eat protein when you need a snack (nuts, yogurt, cheese).  Eat unsweetened gelatins for dessert.  Wear a bracelet used for sea sickness (acupressure wristband).  Go to a doctor that puts thin needles into certain body points (acupuncture) to improve how you feel.  Do not smoke.  Use a humidifier to keep the air in your house free of odors.  Get lots of fresh air. GET HELP IF:  You need medicine to feel better.  You feel dizzy or lightheaded.  You are losing weight. GET HELP RIGHT AWAY IF:   You feel very sick to your stomach and  cannot stop throwing up.  You pass out (faint). MAKE SURE YOU:  Understand these instructions.  Will watch your condition.  Will get help right away if you are not doing well or get worse. Document Released: 11/02/2004 Document Revised: 09/30/2013 Document Reviewed: 03/12/2013 [facility] Patient Information 2015 [place], [place]. This information is not intended to replace advice given to you by your health care provider. Make sure you discuss any questions you have with your health care provider. Bacterial Vaginosis Bacterial vaginosis is a vaginal infection that occurs when the normal balance of bacteria in the vagina is disrupted. It results from an overgrowth of certain bacteria. This is the most common vaginal infection in women of childbearing age. Treatment is important to prevent complications, especially in pregnant women, as it can cause a premature delivery. CAUSES  Bacterial vaginosis is caused by an increase in harmful bacteria that are normally present in smaller amounts in the vagina. Several different kinds of bacteria can cause bacterial vaginosis. However, the reason that the condition develops is not fully understood. RISK FACTORS Certain activities or behaviors can put you at an increased risk of developing bacterial vaginosis, including:  Having a new sex partner or multiple sex partners.  Douching.  Using an intrauterine device (IUD) for contraception. Women do not get bacterial vaginosis from toilet seats, bedding, swimming pools, or contact with objects around them. SIGNS AND SYMPTOMS  Some women  with bacterial vaginosis have no signs or symptoms. Common symptoms include:  Grey vaginal discharge.  A fishlike odor with discharge, especially after sexual intercourse.  Itching or burning of the vagina and vulva.  Burning or pain with urination. DIAGNOSIS  Your health care provider will take a medical history and examine the vagina for signs of bacterial  vaginosis. A sample of vaginal fluid may be taken. Your health care provider will look at this sample under a microscope to check for bacteria and abnormal cells. A vaginal pH test may also be done.  TREATMENT  Bacterial vaginosis may be treated with antibiotic medicines. These may be given in the form of a pill or a vaginal cream. A second round of antibiotics may be prescribed if the condition comes back after treatment.  HOME CARE INSTRUCTIONS   Only take over-the-counter or prescription medicines as directed by your health care provider.  If antibiotic medicine was prescribed, take it as directed. Make sure you finish it even if you start to feel better.  Do not have sex until treatment is completed.  Tell all sexual partners that you have a vaginal infection. They should see their health care provider and be treated if they have problems, such as a mild rash or itching.  Practice safe sex by using condoms and only having one sex partner. SEEK MEDICAL CARE IF:   Your symptoms are not improving after 3 days of treatment.  You have increased discharge or pain.  You have a fever. MAKE SURE YOU:   Understand these instructions.  Will watch your condition.  Will get help right away if you are not doing well or get worse. FOR MORE INFORMATION  Centers for Disease Control and Prevention, Division of STD Prevention: SolutionApps.co.zawww.cdc.gov/std American Sexual Health Association (ASHA): www.ashastd.org  Document Released: 09/25/2005 Document Revised: 07/16/2013 Document Reviewed: 05/07/2013 [facility] Patient Information 2015 [place], [place]. This information is not intended to replace advice given to you by your health care provider. Make sure you discuss any questions you have with your health care provider. Pelvic Rest Pelvic rest is sometimes recommended for women when:   The placenta is partially or completely covering the opening of the cervix (placenta previa).  There is bleeding between  the uterine wall and the amniotic sac in the first trimester (subchorionic hemorrhage).  The cervix begins to open without labor starting (incompetent cervix, cervical insufficiency).  The labor is too early (preterm labor). HOME CARE INSTRUCTIONS  Do not have sexual intercourse, stimulation, or an orgasm.  Do not use tampons, douche, or put anything in the vagina.  Do not lift anything over 10 pounds (4.5 kg).  Avoid strenuous activity or straining your pelvic muscles. SEEK MEDICAL CARE IF:  You have any vaginal bleeding during pregnancy. Treat this as a potential emergency.  You have cramping pain felt low in the stomach (stronger than menstrual cramps).  You notice vaginal discharge (watery, mucus, or bloody).  You have a low, dull backache.  There are regular contractions or uterine tightening. SEEK IMMEDIATE MEDICAL CARE IF: You have vaginal bleeding and have placenta previa.  Document Released: 01/20/2011 Document Revised: 12/18/2011 Document Reviewed: 01/20/2011 [facility] Patient Information 2015 [place], [place]. This information is not intended to replace advice given to you by your health care provider. Make sure you discuss any questions you have with your health care provider.

## 2014-11-07 NOTE — MAU Note (Signed)
Patient presents at [redacted] weeks gestation with complaint of vomiting X 3 weeks, spotting X 2 weeks and abdominal cramping X 1 week.

## 2015-02-07 ENCOUNTER — Inpatient Hospital Stay (HOSPITAL_COMMUNITY)
Admission: AD | Admit: 2015-02-07 | Discharge: 2015-02-08 | Disposition: A | Discharge status: Home / Self Care | Payer: Self-pay | Source: Ambulatory Visit | Attending: Obstetrics and Gynecology | Admitting: Obstetrics and Gynecology

## 2015-02-07 ENCOUNTER — Encounter (HOSPITAL_COMMUNITY): Payer: Self-pay | Admitting: *Deleted

## 2015-02-07 DIAGNOSIS — R109 Unspecified abdominal pain: Secondary | ICD-10-CM

## 2015-02-07 DIAGNOSIS — B9689 Other specified bacterial agents as the cause of diseases classified elsewhere: Secondary | ICD-10-CM | POA: Insufficient documentation

## 2015-02-07 DIAGNOSIS — O26899 Other specified pregnancy related conditions, unspecified trimester: Secondary | ICD-10-CM

## 2015-02-07 DIAGNOSIS — N760 Acute vaginitis: Secondary | ICD-10-CM | POA: Insufficient documentation

## 2015-02-07 DIAGNOSIS — Z3A01 Less than 8 weeks gestation of pregnancy: Secondary | ICD-10-CM | POA: Insufficient documentation

## 2015-02-07 DIAGNOSIS — O23591 Infection of other part of genital tract in pregnancy, first trimester: Secondary | ICD-10-CM | POA: Insufficient documentation

## 2015-02-07 NOTE — MAU Note (Signed)
Pt reports lower abd and lower back pain x 2 weeks off/on. Vaginal discharge x one week.

## 2015-02-08 ENCOUNTER — Inpatient Hospital Stay (HOSPITAL_COMMUNITY): Payer: Self-pay

## 2015-02-08 ENCOUNTER — Ambulatory Visit: Payer: Self-pay | Admitting: Family Medicine

## 2015-02-08 ENCOUNTER — Encounter (HOSPITAL_COMMUNITY): Payer: Self-pay | Admitting: *Deleted

## 2015-02-08 DIAGNOSIS — A499 Bacterial infection, unspecified: Secondary | ICD-10-CM

## 2015-02-08 DIAGNOSIS — N760 Acute vaginitis: Secondary | ICD-10-CM

## 2015-02-08 DIAGNOSIS — O23591 Infection of other part of genital tract in pregnancy, first trimester: Secondary | ICD-10-CM

## 2015-02-08 DIAGNOSIS — R109 Unspecified abdominal pain: Secondary | ICD-10-CM

## 2015-02-08 DIAGNOSIS — Z3A01 Less than 8 weeks gestation of pregnancy: Secondary | ICD-10-CM

## 2015-02-08 DIAGNOSIS — O9989 Other specified diseases and conditions complicating pregnancy, childbirth and the puerperium: Secondary | ICD-10-CM

## 2015-02-08 LAB — GC/CHLAMYDIA PROBE AMP (~~LOC~~) NOT AT ARMC
CHLAMYDIA, DNA PROBE: NEGATIVE
Neisseria Gonorrhea: NEGATIVE

## 2015-02-08 LAB — WET PREP, GENITAL
Trich, Wet Prep: NONE SEEN
YEAST WET PREP: NONE SEEN

## 2015-02-08 LAB — HCG, QUANTITATIVE, PREGNANCY: hCG, Beta Chain, Quant, S: 5552 m[IU]/mL — ABNORMAL HIGH (ref <5)

## 2015-02-08 LAB — CBC
HCT: 36.5 % (ref 36.0–46.0)
Hemoglobin: 11.9 g/dL — ABNORMAL LOW (ref 12.0–15.0)
MCH: 26.8 pg (ref 26.0–34.0)
MCHC: 32.6 g/dL (ref 30.0–36.0)
MCV: 82.2 fL (ref 78.0–100.0)
Platelets: 209 10*3/uL (ref 150–400)
RBC: 4.44 MIL/uL (ref 3.87–5.11)
RDW: 12.6 % (ref 11.5–15.5)
WBC: 6.2 10*3/uL (ref 4.0–10.5)

## 2015-02-08 LAB — URINALYSIS, ROUTINE W REFLEX MICROSCOPIC
Bilirubin Urine: NEGATIVE
GLUCOSE, UA: NEGATIVE mg/dL
Hgb urine dipstick: NEGATIVE
KETONES UR: NEGATIVE mg/dL
Leukocytes, UA: NEGATIVE
Nitrite: NEGATIVE
Protein, ur: NEGATIVE mg/dL
Specific Gravity, Urine: >1.03 — ABNORMAL HIGH (ref 1.005–1.030)
Urobilinogen, UA: 0.2 mg/dL (ref 0.0–1.0)
pH: 6 (ref 5.0–8.0)

## 2015-02-08 LAB — POCT PREGNANCY, URINE: Preg Test, Ur: POSITIVE — AB

## 2015-02-08 LAB — HIV ANTIBODY (ROUTINE TESTING W REFLEX): HIV SCREEN 4TH GENERATION: NONREACTIVE

## 2015-02-08 MED ORDER — METRONIDAZOLE 500 MG PO TABS: 500.0000 mg | ORAL_TABLET | Freq: Two times a day (BID) | ORAL | Status: DC

## 2015-02-08 NOTE — MAU Provider Note (Signed)
History     CSN: [id number]  Arrival date and time: 02/07/15 2336   First Provider Initiated Contact with Patient 02/08/15 0047      Chief Complaint  Patient presents with  . Pelvic Pain   HPI  [name] is a 29 y.o. [phone] at Unknown gestation here with report of vaginal discharge and lower pelvic pain x 2 weeks.  Discharge with positive odor and described as "cloudy".  Intermittent spotting of blood.     Past Medical History  Diagnosis Date  . Chlamydia   . Trichimoniasis   . Gonorrhea   . Preterm labor   . Urinary tract infection   . Abnormal Pap smear     cryo on cervix  . Depression   . Headache(784.0)   . Anemia   . Pregnancy induced hypertension     Chronic hypertension    Past Surgical History  Procedure Laterality Date  . Foot surgery  2004-2004    corns removed from both feet, hammertoes repaired  . Gynecologic cryosurgery      Family History  Problem Relation Age of Onset  . Hypertension Mother   . Hypertension Maternal Aunt   . Hypertension Maternal Grandmother   . Anesthesia problems Neg Hx   . Other Neg Hx   . Diabetes Paternal Grandmother     History  Substance Use Topics  . Smoking status: Never Smoker   . Smokeless tobacco: Never Used  . Alcohol Use: 0.0 oz/week     Comment: occ before     Allergies: No Known Allergies  Prescriptions prior to admission  Medication Sig Dispense Refill Last Dose  . metroNIDAZOLE (FLAGYL) 500 MG tablet Take 1 tablet (500 mg total) by mouth 2 (two) times daily. 14 tablet 0     Review of Systems  Constitutional: Negative for fever and chills.  Respiratory: Positive for shortness of breath (with activity). Negative for cough.   Cardiovascular: Negative for chest pain.  Gastrointestinal: Positive for abdominal pain. Negative for nausea, vomiting, diarrhea and constipation.  Genitourinary: Positive for dysuria, urgency, frequency and flank pain. Negative for hematuria.  Neurological: Positive  for dizziness. Negative for headaches.  All other systems reviewed and are negative.  Physical Exam   Blood pressure 138/94, pulse 79, temperature 98.1 F (36.7 C), temperature source Oral, resp. rate 18, height  (1.626 m), weight 92.534 kg (204 lb), SpO2 98 %, unknown if currently breastfeeding.  Physical Exam  Constitutional: She is oriented to person, place, and time. She appears well-developed and well-nourished. No distress.  HENT:  Head: Normocephalic.  Neck: Normal range of motion. Neck supple.  Cardiovascular: Normal rate and regular rhythm.   Respiratory: Effort normal and breath sounds normal.  GI: Soft. She exhibits no mass. There is tenderness (LLQ). There is no guarding.  Genitourinary: Right adnexum displays no mass and no tenderness. Left adnexum displays tenderness. Left adnexum displays no mass. No bleeding in the vagina. Vaginal discharge (white, creamy; frothy) found.  +fishy odor  Neurological: She is alert and oriented to person, place, and time.  Skin: Skin is warm and dry.    MAU Course  Procedures  Results for orders placed or performed during the hospital encounter of 02/07/15 (from the past 24 hour(s))  Urinalysis, Routine w reflex microscopic     Status: Abnormal   Collection Time: 02/07/15 11:57 PM  Result Value Ref Range   Color, Urine YELLOW YELLOW   APPearance CLEAR CLEAR   Specific Gravity, Urine >  1.030 (H) 1.005 - 1.030   pH 6.0 5.0 - 8.0   Glucose, UA NEGATIVE NEGATIVE mg/dL   Hgb urine dipstick NEGATIVE NEGATIVE   Bilirubin Urine NEGATIVE NEGATIVE   Ketones, ur NEGATIVE NEGATIVE mg/dL   Protein, ur NEGATIVE NEGATIVE mg/dL   Urobilinogen, UA 0.2 0.0 - 1.0 mg/dL   Nitrite NEGATIVE NEGATIVE   Leukocytes, UA NEGATIVE NEGATIVE  Pregnancy, urine POC     Status: Abnormal   Collection Time: 02/08/15 12:24 AM  Result Value Ref Range   Preg Test, Ur POSITIVE (A) NEGATIVE  Wet prep, genital     Status: Abnormal   Collection Time: 02/08/15  12:45 AM  Result Value Ref Range   Yeast Wet Prep HPF POC NONE SEEN NONE SEEN   Trich, Wet Prep NONE SEEN NONE SEEN   Clue Cells Wet Prep HPF POC MODERATE (A) NONE SEEN   WBC, Wet Prep HPF POC MODERATE (A) NONE SEEN  CBC     Status: Abnormal   Collection Time: 02/08/15 12:55 AM  Result Value Ref Range   WBC 6.2 4.0 - 10.5 K/uL   RBC 4.44 3.87 - 5.11 MIL/uL   Hemoglobin 11.9 (L) 12.0 - 15.0 g/dL   HCT 16.136.5 09.636.0 - 04.546.0 %   MCV 82.2 78.0 - 100.0 fL   MCH 26.8 26.0 - 34.0 pg   MCHC 32.6 30.0 - 36.0 g/dL   RDW 40.912.6 81.111.5 - 91.415.5 %   Platelets 209 150 - 400 K/uL  hCG, quantitative, pregnancy     Status: Abnormal   Collection Time: 02/08/15 12:55 AM  Result Value Ref Range   hCG, Beta Chain, Quant, S 5552 (H) <5 mIU/mL   Ultrasound result: FINDINGS: Intrauterine gestational sac: Visible intrauterine gestational sac with poor decidual reaction. The sac appears to change shape during the examination.  Yolk sac: Yes  Embryo: No  Cardiac Activity: No  MSD: 8.3 mm 5 w 4 d  Maternal uterus/adnexae: Unremarkable. No free fluid.  IMPRESSION: Irregular intrauterine gestational sac with poor decidual reaction. Recommend follow-up quantitative B-HCG levels and follow-up US in 14 days to assess viability. This recommendation follows SRU consensus guidelines: Diagnostic Criteria for Nonviable Pregnancy Early in the First Trimester. Malva Limes Engl J Med 2013; 782:9562-13; 369:1443-51.  Assessment and Plan  28 y.o. [id number] at 7248w4d IUP Bacterial Vaginosis  Plan: Discharge to home RX Flagyl 500 BID x 7 days Repeat ultrasound in 10 days Reviewed miscarriage precautions   Elenora FenderKARIM, Pro[facility] Highlands Surgery CenterWALIDAH N 02/08/2015, 12:47 AM

## 2015-02-08 NOTE — MAU Note (Signed)
Pt reports dizziness and shortness of breath with exertion which started today.  Cloudy, discharge with an odor for the past 3-4 days.

## 2015-02-17 ENCOUNTER — Inpatient Hospital Stay (HOSPITAL_COMMUNITY): Payer: Self-pay

## 2015-02-17 ENCOUNTER — Inpatient Hospital Stay (HOSPITAL_COMMUNITY)
Admission: AD | Admit: 2015-02-17 | Discharge: 2015-02-17 | Disposition: A | Discharge status: Home / Self Care | Payer: Self-pay | Source: Ambulatory Visit | Attending: Obstetrics & Gynecology | Admitting: Obstetrics & Gynecology

## 2015-02-17 ENCOUNTER — Encounter (HOSPITAL_COMMUNITY): Payer: Self-pay

## 2015-02-17 DIAGNOSIS — O9989 Other specified diseases and conditions complicating pregnancy, childbirth and the puerperium: Secondary | ICD-10-CM | POA: Insufficient documentation

## 2015-02-17 DIAGNOSIS — O26899 Other specified pregnancy related conditions, unspecified trimester: Secondary | ICD-10-CM

## 2015-02-17 DIAGNOSIS — Z3A01 Less than 8 weeks gestation of pregnancy: Secondary | ICD-10-CM | POA: Insufficient documentation

## 2015-02-17 DIAGNOSIS — R102 Pelvic and perineal pain: Secondary | ICD-10-CM | POA: Insufficient documentation

## 2015-02-17 LAB — URINALYSIS, ROUTINE W REFLEX MICROSCOPIC
BILIRUBIN URINE: NEGATIVE
Glucose, UA: NEGATIVE mg/dL
Hgb urine dipstick: NEGATIVE
KETONES UR: NEGATIVE mg/dL
NITRITE: NEGATIVE
PH: 6 (ref 5.0–8.0)
Protein, ur: NEGATIVE mg/dL
SPECIFIC GRAVITY, URINE: 1.025 (ref 1.005–1.030)
Urobilinogen, UA: 1 mg/dL (ref 0.0–1.0)

## 2015-02-17 LAB — URINE MICROSCOPIC-ADD ON

## 2015-02-17 NOTE — MAU Note (Signed)
Pt states that she has had lower abd cramping and back pain for about one month, was seen here 05/01, and was told she was pregnant. Pt states the pain has not worsened but it just doesn't go away. Denies nausea, vomiting, diarrhea, fever. States she had some brownish discharge a few days ago but it went away.

## 2015-02-17 NOTE — Discharge Instructions (Signed)
First Trimester of Pregnancy The first trimester of pregnancy is from week 1 until the end of week 12 (months 1 through 3). A week after a sperm fertilizes an egg, the egg will implant on the wall of the uterus. This embryo will begin to develop into a baby. Genes from you and your partner are forming the baby. The female genes determine whether the baby is a boy or a girl. At 6-8 weeks, the eyes and face are formed, and the heartbeat can be seen on ultrasound. At the end of 12 weeks, all the baby's organs are formed.  Now that you are pregnant, you will want to do everything you can to have a healthy baby. Two of the most important things are to get good prenatal care and to follow your health care provider's instructions. Prenatal care is all the medical care you receive before the baby's birth. This care will help prevent, find, and treat any problems during the pregnancy and childbirth. BODY CHANGES Your body goes through many changes during pregnancy. The changes vary from woman to woman.   You may gain or lose a couple of pounds at first.  You may feel sick to your stomach (nauseous) and throw up (vomit). If the vomiting is uncontrollable, call your health care provider.  You may tire easily.  You may develop headaches that can be relieved by medicines approved by your health care provider.  You may urinate more often. Painful urination may mean you have a bladder infection.  You may develop heartburn as a result of your pregnancy.  You may develop constipation because certain hormones are causing the muscles that push waste through your intestines to slow down.  You may develop hemorrhoids or swollen, bulging veins (varicose veins).  Your breasts may begin to grow larger and become tender. Your nipples may stick out more, and the tissue that surrounds them (areola) may become darker.  Your gums may bleed and may be sensitive to brushing and flossing.  Dark spots or blotches (chloasma,  mask of pregnancy) may develop on your face. This will likely fade after the baby is born.  Your menstrual periods will stop.  You may have a loss of appetite.  You may develop cravings for certain kinds of food.  You may have changes in your emotions from day to day, such as being excited to be pregnant or being concerned that something may go wrong with the pregnancy and baby.  You may have more vivid and strange dreams.  You may have changes in your hair. These can include thickening of your hair, rapid growth, and changes in texture. Some women also have hair loss during or after pregnancy, or hair that feels dry or thin. Your hair will most likely return to normal after your baby is born. WHAT TO EXPECT AT YOUR PRENATAL VISITS During a routine prenatal visit:  You will be weighed to make sure you and the baby are growing normally.  Your blood pressure will be taken.  Your abdomen will be measured to track your baby's growth.  The fetal heartbeat will be listened to starting around week 10 or 12 of your pregnancy.  Test results from any previous visits will be discussed. Your health care provider may ask you:  How you are feeling.  If you are feeling the baby move.  If you have had any abnormal symptoms, such as leaking fluid, bleeding, severe headaches, or abdominal cramping.  If you have any questions. Other tests   that may be performed during your first trimester include:  Blood tests to find your blood type and to check for the presence of any previous infections. They will also be used to check for low iron levels (anemia) and Rh antibodies. Later in the pregnancy, blood tests for diabetes will be done along with other tests if problems develop.  Urine tests to check for infections, diabetes, or protein in the urine.  An ultrasound to confirm the proper growth and development of the baby.  An amniocentesis to check for possible genetic problems.  Fetal screens for  spina bifida and Down syndrome.  You may need other tests to make sure you and the baby are doing well. HOME CARE INSTRUCTIONS  Medicines  Follow your health care provider's instructions regarding medicine use. Specific medicines may be either safe or unsafe to take during pregnancy.  Take your prenatal vitamins as directed.  If you develop constipation, try taking a stool softener if your health care provider approves. Diet  Eat regular, well-balanced meals. Choose a variety of foods, such as meat or vegetable-based protein, fish, milk and low-fat dairy products, vegetables, fruits, and whole grain breads and cereals. Your health care provider will help you determine the amount of weight gain that is right for you.  Avoid raw meat and uncooked cheese. These carry germs that can cause birth defects in the baby.  Eating four or five small meals rather than three large meals a day may help relieve nausea and vomiting. If you start to feel nauseous, eating a few soda crackers can be helpful. Drinking liquids between meals instead of during meals also seems to help nausea and vomiting.  If you develop constipation, eat more high-fiber foods, such as fresh vegetables or fruit and whole grains. Drink enough fluids to keep your urine clear or pale yellow. Activity and Exercise  Exercise only as directed by your health care provider. Exercising will help you:  Control your weight.  Stay in shape.  Be prepared for labor and delivery.  Experiencing pain or cramping in the lower abdomen or low back is a good sign that you should stop exercising. Check with your health care provider before continuing normal exercises.  Try to avoid standing for long periods of time. Move your legs often if you must stand in one place for a long time.  Avoid heavy lifting.  Wear low-heeled shoes, and practice good posture.  You may continue to have sex unless your health care provider directs you  otherwise. Relief of Pain or Discomfort  Wear a good support bra for breast tenderness.   Take warm sitz baths to soothe any pain or discomfort caused by hemorrhoids. Use hemorrhoid cream if your health care provider approves.   Rest with your legs elevated if you have leg cramps or low back pain.  If you develop varicose veins in your legs, wear support hose. Elevate your feet for 15 minutes, 3-4 times a day. Limit salt in your diet. Prenatal Care  Schedule your prenatal visits by the twelfth week of pregnancy. They are usually scheduled monthly at first, then more often in the last 2 months before delivery.  Write down your questions. Take them to your prenatal visits.  Keep all your prenatal visits as directed by your health care provider. Safety  Wear your seat belt at all times when driving.  Make a list of emergency phone numbers, including numbers for family, friends, the hospital, and police and fire departments. General Tips    Ask your health care provider for a referral to a local prenatal education class. Begin classes no later than at the beginning of month 6 of your pregnancy.  Ask for help if you have counseling or nutritional needs during pregnancy. Your health care provider can offer advice or refer you to specialists for help with various needs.  Do not use hot tubs, steam rooms, or saunas.  Do not douche or use tampons or scented sanitary pads.  Do not cross your legs for long periods of time.  Avoid cat litter boxes and soil used by cats. These carry germs that can cause birth defects in the baby and possibly loss of the fetus by miscarriage or stillbirth.  Avoid all smoking, herbs, alcohol, and medicines not prescribed by your health care provider. Chemicals in these affect the formation and growth of the baby.  Schedule a dentist appointment. At home, brush your teeth with a soft toothbrush and be gentle when you floss. SEEK MEDICAL CARE IF:   You have  dizziness.  You have mild pelvic cramps, pelvic pressure, or nagging pain in the abdominal area.  You have persistent nausea, vomiting, or diarrhea.  You have a bad smelling vaginal discharge.  You have pain with urination.  You notice increased swelling in your face, hands, legs, or ankles. SEEK IMMEDIATE MEDICAL CARE IF:   You have a fever.  You are leaking fluid from your vagina.  You have spotting or bleeding from your vagina.  You have severe abdominal cramping or pain.  You have rapid weight gain or loss.  You vomit blood or material that looks like coffee grounds.  You are exposed to German measles and have never had them.  You are exposed to fifth disease or chickenpox.  You develop a severe headache.  You have shortness of breath.  You have any kind of trauma, such as from a fall or a car accident. Document Released: 09/19/2001 Document Revised: 02/09/2014 Document Reviewed: 08/05/2013 [employer] Patient Information 2015 [employer]. This information is not intended to replace advice given to you by your health care provider. Make sure you discuss any questions you have with your health care provider.  

## 2015-02-17 NOTE — MAU Provider Note (Signed)
History     CSN: [id number]  Arrival date and time: 02/17/15 2120   First Provider Initiated Contact with Patient 02/17/15 2305      No chief complaint on file.  HPI Comments: Christine Cervantes is a 29 y.o. A [id number] at 5450w6d who presents today with lower abdominal pain. She has had the pain off and on since finding out she was pregnant. She denies any vaginal bleeding. She did not have a FU US scheduled, and was told to have one in 10 days.   Pelvic Pain The patient's primary symptoms include pelvic pain. The patient's pertinent negatives include no vaginal bleeding or vaginal discharge. This is a recurrent problem. The current episode started 1 to 4 weeks ago. The problem occurs intermittently. The problem has been unchanged. The pain is mild. The problem affects both sides. She is pregnant. Associated symptoms include abdominal pain. Pertinent negatives include no constipation, diarrhea, dysuria, fever, nausea, urgency or vomiting. Nothing aggravates the symptoms. She has tried nothing for the symptoms.    Past Medical History  Diagnosis Date  . Chlamydia   . Trichimoniasis   . Gonorrhea   . Preterm labor   . Urinary tract infection   . Abnormal Pap smear     cryo on cervix  . Depression   . Headache(784.0)   . Anemia   . Pregnancy induced hypertension     Chronic hypertension    Past Surgical History  Procedure Laterality Date  . Foot surgery  2004-2004    corns removed from both feet, hammertoes repaired  . Gynecologic cryosurgery      Family History  Problem Relation Age of Onset  . Hypertension Mother   . Hypertension Maternal Aunt   . Hypertension Maternal Grandmother   . Anesthesia problems Neg Hx   . Other Neg Hx   . Diabetes Paternal Grandmother     History  Substance Use Topics  . Smoking status: Never Smoker   . Smokeless tobacco: Never Used  . Alcohol Use: 0.0 oz/week     Comment: occ before preg    Allergies:  Allergies  Allergen Reactions  .  Latex Swelling    Prescriptions prior to admission  Medication Sig Dispense Refill Last Dose  . metroNIDAZOLE (FLAGYL) 500 MG tablet Take 1 tablet (500 mg total) by mouth 2 (two) times daily. 14 tablet 0     Review of Systems  Constitutional: Negative for fever.  Gastrointestinal: Positive for abdominal pain. Negative for nausea, vomiting, diarrhea and constipation.  Genitourinary: Positive for pelvic pain. Negative for dysuria, urgency and vaginal discharge.   Physical Exam   Blood pressure 110/63, pulse 72, temperature 98.2 F (36.8 C), temperature source Oral, resp. rate 16, height 5\' 5"  (1.651 m), weight 93.044 kg (205 lb 2 oz), last menstrual period 09/02/2014, SpO2 100 %, unknown if currently breastfeeding.  Physical Exam  Nursing note and vitals reviewed. Constitutional: She is oriented to person, place, and time. She appears well-developed and well-nourished. No distress.  Cardiovascular: Normal rate.   Respiratory: Effort normal.  GI: Soft. There is no tenderness. There is no rebound.  Neurological: She is alert and oriented to person, place, and time.  Skin: Skin is warm and dry.  Psychiatric: She has a normal mood and affect.    Results for orders placed or performed during the hospital encounter of 02/17/15 (from the past 24 hour(s))  Urinalysis, Routine w reflex microscopic     Status: Abnormal   Collection Time: 02/17/15  9:30 PM  Result Value Ref Range   Color, Urine YELLOW YELLOW   APPearance CLEAR CLEAR   Specific Gravity, Urine 1.025 1.005 - 1.030   pH 6.0 5.0 - 8.0   Glucose, UA NEGATIVE NEGATIVE mg/dL   Hgb urine dipstick NEGATIVE NEGATIVE   Bilirubin Urine NEGATIVE NEGATIVE   Ketones, ur NEGATIVE NEGATIVE mg/dL   Protein, ur NEGATIVE NEGATIVE mg/dL   Urobilinogen, UA 1.0 0.0 - 1.0 mg/dL   Nitrite NEGATIVE NEGATIVE   Leukocytes, UA TRACE (A) NEGATIVE  Urine microscopic-add on     Status: Abnormal   Collection Time: 02/17/15  9:30 PM  Result Value Ref  Range   Squamous Epithelial / LPF RARE RARE   WBC, UA 0-2 <3 WBC/hpf   RBC / HPF 0-2 <3 RBC/hpf   Bacteria, UA FEW (A) RARE   [place] Ob Transvaginal  02/17/2015   CLINICAL DATA:  Pelvic and back pain for 1 month. Brown discharge for a few days but 1 awake. Pregnant with uncertain LMP. Quantitative beta HCG was not obtained today but on 02/08/2015 was 5,552.  EXAM: OBSTETRIC <14 WK US AND TRANSVAGINAL OB US  TECHNIQUE: Both transabdominal and transvaginal ultrasound examinations were performed for complete evaluation of the gestation as well as the maternal uterus, adnexal regions, and pelvic cul-de-sac. Transvaginal technique was performed to assess early pregnancy.  COMPARISON:  02/08/2015  FINDINGS: Intrauterine gestational sac: Single intrauterine gestation is present.  Yolk sac:  Yolk sac is present.  Embryo:  Fetal pole is present.  Cardiac Activity: Fetal cardiac activity is observed.  Heart Rate: 121  bpm  CRL:  7.5  mm   6 w   5 d                  US EDC: 10/08/2015  Maternal uterus/adnexae: Uterus is mildly anteverted. No myometrial mass lesions identified. No subchorionic hemorrhage. Both ovaries are visualized and appear normal. No free pelvic fluid.  IMPRESSION: Single intrauterine pregnancy with estimated gestational age based on crown-rump length of 6 weeks 5 days. No acute complication is suggested.   Electronically Signed   By: Burman NievesWilliam  Stevens M.D.   On: 02/17/2015 23:36    MAU Course  Procedures  MDM   Assessment and Plan   1. Pelvic pain affecting pregnancy    DC home SIUP on US today, + cardiac activity Return to MAU as needed [facility] as soon as possible.  Follow-up Information    Schedule an appointment as soon as possible for [facility].   Specialty:  Obstetrics and Gynecology   Contact information:   [address] [place] [id number] [phone]       Tawnya CrookHogan, Heather Donovan 02/17/2015, 11:31 PM

## 2015-02-17 NOTE — Progress Notes (Signed)
Thressa ShellerHeather Hogan CNM gave pt verbal d/c instructions and written list of OTC meds she can take if needed, her u/s pic, and list of OB/GYn providers in our area.  Told pt to wait for written d/c instructions and for nurse to d/c her but pt left without being formally discharged.

## 2015-03-26 ENCOUNTER — Inpatient Hospital Stay (HOSPITAL_COMMUNITY)
Admission: AD | Admit: 2015-03-26 | Discharge: 2015-03-26 | Disposition: A | Discharge status: Home / Self Care | Payer: Medicaid Other | Source: Ambulatory Visit | Attending: Obstetrics & Gynecology | Admitting: Obstetrics & Gynecology

## 2015-03-26 ENCOUNTER — Encounter (HOSPITAL_COMMUNITY): Payer: Self-pay | Admitting: *Deleted

## 2015-03-26 DIAGNOSIS — Z3A12 12 weeks gestation of pregnancy: Secondary | ICD-10-CM | POA: Diagnosis not present

## 2015-03-26 DIAGNOSIS — O99611 Diseases of the digestive system complicating pregnancy, first trimester: Secondary | ICD-10-CM | POA: Insufficient documentation

## 2015-03-26 DIAGNOSIS — K2970 Gastritis, unspecified, without bleeding: Secondary | ICD-10-CM | POA: Diagnosis not present

## 2015-03-26 DIAGNOSIS — O210 Mild hyperemesis gravidarum: Secondary | ICD-10-CM | POA: Diagnosis present

## 2015-03-26 DIAGNOSIS — O219 Vomiting of pregnancy, unspecified: Secondary | ICD-10-CM

## 2015-03-26 LAB — COMPREHENSIVE METABOLIC PANEL
ALK PHOS: 36 U/L — AB (ref 38–126)
ALT: 14 U/L (ref 14–54)
ANION GAP: 6 (ref 5–15)
AST: 13 U/L — ABNORMAL LOW (ref 15–41)
Albumin: 3.5 g/dL (ref 3.5–5.0)
BILIRUBIN TOTAL: 0.2 mg/dL — AB (ref 0.3–1.2)
BUN: <5 mg/dL — ABNORMAL LOW (ref 6–20)
CO2: 24 mmol/L (ref 22–32)
Calcium: 8.8 mg/dL — ABNORMAL LOW (ref 8.9–10.3)
Chloride: 106 mmol/L (ref 101–111)
Creatinine, Ser: 0.67 mg/dL (ref 0.44–1.00)
GFR calc Af Amer: >60 mL/min (ref >60)
GLUCOSE: 89 mg/dL (ref 65–99)
Potassium: 3.7 mmol/L (ref 3.5–5.1)
SODIUM: 136 mmol/L (ref 135–145)
TOTAL PROTEIN: 6.8 g/dL (ref 6.5–8.1)

## 2015-03-26 LAB — CBC WITH DIFFERENTIAL/PLATELET
Basophils Absolute: 0 10*3/uL (ref 0.0–0.1)
Basophils Relative: 0 % (ref 0–1)
EOS PCT: 1 % (ref 0–5)
Eosinophils Absolute: 0.1 10*3/uL (ref 0.0–0.7)
HEMATOCRIT: 33.7 % — AB (ref 36.0–46.0)
HEMOGLOBIN: 11 g/dL — AB (ref 12.0–15.0)
Lymphocytes Relative: 25 % (ref 12–46)
Lymphs Abs: 1.3 10*3/uL (ref 0.7–4.0)
MCH: 26.8 pg (ref 26.0–34.0)
MCHC: 32.6 g/dL (ref 30.0–36.0)
MCV: 82 fL (ref 78.0–100.0)
MONO ABS: 0.3 10*3/uL (ref 0.1–1.0)
MONOS PCT: 6 % (ref 3–12)
Neutro Abs: 3.7 10*3/uL (ref 1.7–7.7)
Neutrophils Relative %: 68 % (ref 43–77)
Platelets: 196 10*3/uL (ref 150–400)
RBC: 4.11 MIL/uL (ref 3.87–5.11)
RDW: 13.1 % (ref 11.5–15.5)
WBC: 5.4 10*3/uL (ref 4.0–10.5)

## 2015-03-26 LAB — URINALYSIS, ROUTINE W REFLEX MICROSCOPIC
Bilirubin Urine: NEGATIVE
Glucose, UA: NEGATIVE mg/dL
HGB URINE DIPSTICK: NEGATIVE
Ketones, ur: NEGATIVE mg/dL
Nitrite: NEGATIVE
PROTEIN: NEGATIVE mg/dL
SPECIFIC GRAVITY, URINE: 1.015 (ref 1.005–1.030)
Urobilinogen, UA: 1 mg/dL (ref 0.0–1.0)
pH: 7 (ref 5.0–8.0)

## 2015-03-26 LAB — URINE MICROSCOPIC-ADD ON

## 2015-03-26 LAB — AMYLASE: AMYLASE: 46 U/L (ref 28–100)

## 2015-03-26 MED ORDER — FAMOTIDINE 20 MG PO TABS: 20.0000 mg | ORAL_TABLET | Freq: Two times a day (BID) | ORAL | Status: DC

## 2015-03-26 MED ORDER — PROMETHAZINE HCL 12.5 MG PO TABS
12.5000 mg | ORAL_TABLET | Freq: Four times a day (QID) | ORAL | Status: DC | PRN
Start: 2015-03-26 — End: 2015-08-08

## 2015-03-26 MED ORDER — METOCLOPRAMIDE HCL 10 MG PO TABS: 10.0000 mg | ORAL_TABLET | Freq: Four times a day (QID) | ORAL | Status: DC

## 2015-03-26 MED ORDER — ACETAMINOPHEN 500 MG PO TABS
1000.0000 mg | ORAL_TABLET | Freq: Once | ORAL | Status: AC
  Administered 2015-03-26: 1000 mg via ORAL
  Filled 2015-03-26: qty 2

## 2015-03-26 MED ORDER — METOCLOPRAMIDE HCL 10 MG PO TABS
10.0000 mg | ORAL_TABLET | Freq: Once | ORAL | Status: AC
  Administered 2015-03-26: 10 mg via ORAL
  Filled 2015-03-26: qty 1

## 2015-03-26 NOTE — MAU Note (Signed)
States she has been having upper abdominal aching and vomiting for a few weeks. Was unsure if she was going to keep pregnancy so she has not started [facility].Has decided to keep pregnancy, but no plan for care. No bleeding or vaginal D/C.

## 2015-03-26 NOTE — Discharge Instructions (Signed)
Eating Plan for Hyperemesis Gravidarum °Severe cases of hyperemesis gravidarum can lead to dehydration and malnutrition. The hyperemesis eating plan is one way to lessen the symptoms of nausea and vomiting. It is often used with prescribed medicines to control your symptoms.  °WHAT CAN I DO TO RELIEVE MY SYMPTOMS? °Listen to your body. Everyone is different and has different preferences. Find what works best for you. Some of the following things may help: °· Eat and drink slowly. °· Eat 5-6 small meals daily instead of 3 large meals.   °· Eat crackers before you get out of bed in the morning.   °· Starchy foods are usually well tolerated (such as cereal, toast, bread, potatoes, pasta, rice, and pretzels).   °· Ginger may help with nausea. Add ¼ tsp ground ginger to hot tea or choose ginger tea.   °· Try drinking 100% fruit juice or an electrolyte drink. °· Continue to take your prenatal vitamins as directed by your health care provider. If you are having trouble taking your prenatal vitamins, talk with your health care provider about different options. °· Include at least 1 serving of protein with your meals and snacks (such as meats or poultry, beans, nuts, eggs, or yogurt). Try eating a protein-rich snack before bed (such as cheese and crackers or a half turkey or peanut butter sandwich). °WHAT THINGS SHOULD I AVOID TO REDUCE MY SYMPTOMS? °The following things may help reduce your symptoms: °· Avoid foods with strong smells. Try eating meals in well-ventilated areas that are free of odors. °· Avoid drinking water or other beverages with meals. Try not to drink anything less than 30 minutes before and after meals. °· Avoid drinking more than 1 cup of fluid at a time. °· Avoid fried or high-fat foods, such as butter and cream sauces. °· Avoid spicy foods. °· Avoid skipping meals the best you can. Nausea can be more intense on an empty stomach. If you cannot tolerate food at that time, do not force it. Try sucking on  ice chips or other frozen items and make up the calories later. °· Avoid lying down within 2 hours after eating. °Document Released: 07/23/2007 Document Revised: 09/30/2013 Document Reviewed: 07/30/2013 °ExitCare® Patient Information ©2015 [employer]. This information is not intended to replace advice given to you by your health care provider. Make sure you discuss any questions you have with your health care provider. ° °Morning Sickness °Morning sickness is when you feel sick to your stomach (nauseous) during pregnancy. You may feel sick to your stomach and throw up (vomit). You may feel sick in the morning, but you can feel this way any time of day. Some women feel very sick to their stomach and cannot stop throwing up (hyperemesis gravidarum). °HOME CARE °· Only take medicines as told by your doctor. °· Take multivitamins as told by your doctor. Taking multivitamins before getting pregnant can stop or lessen the harshness of morning sickness. °· Eat dry toast or unsalted crackers before getting out of bed. °· Eat 5 to 6 small meals a day. °· Eat dry and bland foods like rice and baked potatoes. °· Do not drink liquids with meals. Drink between meals. °· Do not eat greasy, fatty, or spicy foods. °· Have someone cook for you if the smell of food causes you to feel sick or throw up. °· If you feel sick to your stomach after taking prenatal vitamins, take them at night or with a snack. °· Eat protein when you need a snack (nuts,   yogurt, cheese). °· Eat unsweetened gelatins for dessert. °· Wear a bracelet used for sea sickness (acupressure wristband). °· Go to a doctor that puts thin needles into certain body points (acupuncture) to improve how you feel. °· Do not smoke. °· Use a humidifier to keep the air in your house free of odors. °· Get lots of fresh air. °GET HELP IF: °· You need medicine to feel better. °· You feel dizzy or lightheaded. °· You are losing weight. °GET HELP RIGHT AWAY IF:  °· You feel very  sick to your stomach and cannot stop throwing up. °· You pass out (faint). °MAKE SURE YOU: °· Understand these instructions. °· Will watch your condition. °· Will get help right away if you are not doing well or get worse. °Document Released: 11/02/2004 Document Revised: 09/30/2013 Document Reviewed: 03/12/2013 °ExitCare® Patient Information ©2015 [employer]. This information is not intended to replace advice given to you by your health care provider. Make sure you discuss any questions you have with your health care provider. ° °

## 2015-03-26 NOTE — MAU Provider Note (Signed)
History     CSN: [id number]  Arrival date and time: 03/26/15 0354   First Provider Initiated Contact with Patient 03/26/15 [phone]      Chief Complaint  Patient presents with  . Abdominal Pain   HPI  [name] is a 29 y.o. [id number] at [redacted]w[redacted]d who presents to MAU today with complaint of N/V x weeks. The patient states associated midline upper abdominal pain that is constant. She states it was worse last night and improving slightly this morning. She denies heartburn, vaginal bleeding, discharge or lower abdominal pain. She was undecided about continuing the pregnancy until now. She has not started prenatal care. She has a history of PTD with one previous pregnancy.   OB History    Gravida Para Term Preterm AB TAB SAB Ectopic Multiple Living   6 3 2 1 2  2   3       Past Medical History  Diagnosis Date  . Chlamydia   . Trichimoniasis   . Gonorrhea   . Preterm labor   . Urinary tract infection   . Abnormal Pap smear     cryo on cervix  . Depression   . Headache(784.0)   . Anemia   . Pregnancy induced hypertension     Chronic hypertension    Past Surgical History  Procedure Laterality Date  . Foot surgery  2004-2004    corns removed from both feet, hammertoes repaired  . Gynecologic cryosurgery      Family History  Problem Relation Age of Onset  . Hypertension Mother   . Hypertension Maternal Aunt   . Hypertension Maternal Grandmother   . Anesthesia problems Neg Hx   . Other Neg Hx   . Diabetes Paternal Grandmother     History  Substance Use Topics  . Smoking status: Never Smoker   . Smokeless tobacco: Never Used  . Alcohol Use: 0.0 oz/week     Comment: occ before preg    Allergies:  Allergies  Allergen Reactions  . Latex Swelling    No prescriptions prior to admission    Review of Systems  Constitutional: Negative for fever and malaise/fatigue.  Gastrointestinal: Positive for nausea, vomiting and abdominal pain. Negative for heartburn,  diarrhea and constipation.  Genitourinary: Negative for dysuria, urgency and frequency.       Neg - vaginal bleeding, discharge   Physical Exam   Blood pressure 134/80, pulse 81, temperature 98.2 F (36.8 C), temperature source Oral, resp. rate 18, height 5\' 3"  (1.6 m), weight 203 lb (92.08 kg), last menstrual period 09/02/2014, unknown if currently breastfeeding.  Physical Exam  Nursing note and vitals reviewed. Constitutional: She is oriented to person, place, and time. She appears well-developed and well-nourished. No distress.  HENT:  Head: Normocephalic and atraumatic.  Cardiovascular: Normal rate.   Respiratory: Effort normal.  GI: Soft. Bowel sounds are normal. She exhibits no distension and no mass. There is tenderness (mild epigastric tenderness to palpation). There is no rebound and no guarding.  Neurological: She is alert and oriented to person, place, and time.  Skin: Skin is warm and dry. No erythema.  Psychiatric: She has a normal mood and affect.   Results for orders placed or performed during the hospital encounter of 03/26/15 (from the past 24 hour(s))  Urinalysis, Routine w reflex microscopic (not at [facility])     Status: Abnormal   Collection Time: 03/26/15  8:53 AM  Result Value Ref Range   Color, Urine YELLOW YELLOW   APPearance  CLEAR CLEAR   Specific Gravity, Urine 1.015 1.005 - 1.030   pH 7.0 5.0 - 8.0   Glucose, UA NEGATIVE NEGATIVE mg/dL   Hgb urine dipstick NEGATIVE NEGATIVE   Bilirubin Urine NEGATIVE NEGATIVE   Ketones, ur NEGATIVE NEGATIVE mg/dL   Protein, ur NEGATIVE NEGATIVE mg/dL   Urobilinogen, UA 1.0 0.0 - 1.0 mg/dL   Nitrite NEGATIVE NEGATIVE   Leukocytes, UA TRACE (A) NEGATIVE  Urine microscopic-add on     Status: Abnormal   Collection Time: 03/26/15  8:53 AM  Result Value Ref Range   Squamous Epithelial / LPF FEW (A) RARE   WBC, UA 3-6 <3 WBC/hpf   Bacteria, UA FEW (A) RARE   Urine-Other MUCOUS PRESENT   CBC with Differential/Platelet      Status: Abnormal   Collection Time: 03/26/15  9:34 AM  Result Value Ref Range   WBC 5.4 4.0 - 10.5 K/uL   RBC 4.11 3.87 - 5.11 MIL/uL   Hemoglobin 11.0 (L) 12.0 - 15.0 g/dL   HCT 91.4 (L) 78.2 - 95.6 %   MCV 82.0 78.0 - 100.0 fL   MCH 26.8 26.0 - 34.0 pg   MCHC 32.6 30.0 - 36.0 g/dL   RDW 21.3 08.6 - 57.8 %   Platelets 196 150 - 400 K/uL   Neutrophils Relative % 68 43 - 77 %   Neutro Abs 3.7 1.7 - 7.7 K/uL   Lymphocytes Relative 25 12 - 46 %   Lymphs Abs 1.3 0.7 - 4.0 K/uL   Monocytes Relative 6 3 - 12 %   Monocytes Absolute 0.3 0.1 - 1.0 K/uL   Eosinophils Relative 1 0 - 5 %   Eosinophils Absolute 0.1 0.0 - 0.7 K/uL   Basophils Relative 0 0 - 1 %   Basophils Absolute 0.0 0.0 - 0.1 K/uL  Comprehensive metabolic panel     Status: Abnormal (Preliminary result)   Collection Time: 03/26/15  9:34 AM  Result Value Ref Range   Sodium 136 135 - 145 mmol/L   Potassium 3.7 3.5 - 5.1 mmol/L   Chloride 106 101 - 111 mmol/L   CO2 24 22 - 32 mmol/L   Glucose, Bld 89 65 - 99 mg/dL   BUN PENDING 6 - 20 mg/dL   Creatinine, Ser 4.69 0.44 - 1.00 mg/dL   Calcium 8.8 (L) 8.9 - 10.3 mg/dL   Total Protein 6.8 6.5 - 8.1 g/dL   Albumin 3.5 3.5 - 5.0 g/dL   AST 13 (L) 15 - 41 U/L   ALT 14 14 - 54 U/L   Alkaline Phosphatase 36 (L) 38 - 126 U/L   Total Bilirubin 0.2 (L) 0.3 - 1.2 mg/dL   GFR calc non Af Amer >60 >60 mL/min   GFR calc Af Amer >60 >60 mL/min   Anion gap 6 5 - 15  Amylase     Status: None   Collection Time: 03/26/15  9:34 AM  Result Value Ref Range   Amylase 46 28 - 100 U/L    MAU Course  Procedures None  MDM FHR - 146 bpm with doppler UA today shows no signs of dehydration. Patient is not actively vomiting, able to tolerate liquids, plans to drive herself home. Will try PO Reglan.  CBC, CMP, amylase today Patient states pain is now radiating through to her mid back. 1000 mg Tyelnol given.  Patient reports improvement in pain and nausea. No emesis while in MAU. Able to  tolerate PO.  Patient declines referral to  [facility] at [facility], states that they have "set schedule" for clinic and she cannot miss school  Assessment and Plan  A: SIUP at [redacted]w[redacted]d Nausea and vomiting in pregnancy prior to [redacted] weeks gestation Gastritis  P: Discharge home Rx for Reglan, Phenergan and Pepcid sent to patient's pharmacy Second trimester precautions discussed Diet for N/V discussed Increased PO hydration advised Pregnancy confirmation letter and list of OB providers given Patient advised to follow-up with OB provider of choice to start prenatal care Patient may return to MAU as needed or if her condition were to change or worsen  Marny Lowenstein, PA-C  03/26/2015, 11:18 AM

## 2015-04-01 ENCOUNTER — Inpatient Hospital Stay (HOSPITAL_COMMUNITY): Payer: Medicaid Other

## 2015-04-01 ENCOUNTER — Inpatient Hospital Stay (HOSPITAL_COMMUNITY)
Admission: AD | Admit: 2015-04-01 | Discharge: 2015-04-01 | Disposition: A | Discharge status: Home / Self Care | Payer: Medicaid Other | Source: Ambulatory Visit | Attending: Family Medicine | Admitting: Family Medicine

## 2015-04-01 ENCOUNTER — Encounter (HOSPITAL_COMMUNITY): Payer: Self-pay

## 2015-04-01 DIAGNOSIS — N939 Abnormal uterine and vaginal bleeding, unspecified: Secondary | ICD-10-CM | POA: Insufficient documentation

## 2015-04-01 DIAGNOSIS — O034 Incomplete spontaneous abortion without complication: Secondary | ICD-10-CM

## 2015-04-01 DIAGNOSIS — O209 Hemorrhage in early pregnancy, unspecified: Secondary | ICD-10-CM

## 2015-04-01 LAB — CBC
HCT: 33.9 % — ABNORMAL LOW (ref 36.0–46.0)
HEMOGLOBIN: 11 g/dL — AB (ref 12.0–15.0)
MCH: 27 pg (ref 26.0–34.0)
MCHC: 32.4 g/dL (ref 30.0–36.0)
MCV: 83.3 fL (ref 78.0–100.0)
Platelets: 196 10*3/uL (ref 150–400)
RBC: 4.07 MIL/uL (ref 3.87–5.11)
RDW: 13.1 % (ref 11.5–15.5)
WBC: 8.1 10*3/uL (ref 4.0–10.5)

## 2015-04-01 LAB — HCG, QUANTITATIVE, PREGNANCY: HCG, BETA CHAIN, QUANT, S: 3308 m[IU]/mL — AB (ref <5)

## 2015-04-01 NOTE — MAU Note (Signed)
Pt reports she has been having moderate vaginal bleeding and cramping today. Something passed on her pad and she brought i t with her.

## 2015-04-01 NOTE — MAU Provider Note (Signed)
  History     CSN: [id number]  Arrival date and time: 04/01/15 1722   None     Chief Complaint  Patient presents with  . Vaginal Bleeding   Patient is a 29 y.o. female presenting with vaginal bleeding.  Vaginal Bleeding  pt is O POS Pt had ultrasound on 02/17/2015 showing SLIUP [redacted]w[redacted]d- seen for pelvic and back pain for 1 month with brown discharge and uncertain LMP; pt was seen on 03/26/2015 ([redacted]w[redacted]d)with midline upper abd constant pain - she was undecided about continuing the pregnancy until that visit. FHT were obtained with doppler 146 bpm at that visit RN note: Pt reports she has been having moderate vaginal bleeding and cramping today. Something passed on her pad and she brought i t with her.  Past Medical History  Diagnosis Date  . Chlamydia   . Trichimoniasis   . Gonorrhea   . Preterm labor   . Urinary tract infection   . Abnormal Pap smear     cryo on cervix  . Depression   . Headache(784.0)   . Anemia   . Pregnancy induced hypertension     Chronic hypertension    Past Surgical History  Procedure Laterality Date  . Foot surgery  2004-2004    corns removed from both feet, hammertoes repaired  . Gynecologic cryosurgery      Family History  Problem Relation Age of Onset  . Hypertension Mother   . Hypertension Maternal Aunt   . Hypertension Maternal Grandmother   . Anesthesia problems Neg Hx   . Other Neg Hx   . Diabetes Paternal Grandmother     History  Substance Use Topics  . Smoking status: Never Smoker   . Smokeless tobacco: Never Used  . Alcohol Use: 0.0 oz/week     Comment: occ before preg    Allergies:  Allergies  Allergen Reactions  . Latex Swelling    Prescriptions prior to admission  Medication Sig Dispense Refill Last Dose  . famotidine (PEPCID) 20 MG tablet Take 1 tablet (20 mg total) by mouth 2 (two) times daily. 30 tablet 0   . metoCLOPramide (REGLAN) 10 MG tablet Take 1 tablet (10 mg total) by mouth every 6 (six) hours. 30 tablet 0    . promethazine (PHENERGAN) 12.5 MG tablet Take 1 tablet (12.5 mg total) by mouth every 6 (six) hours as needed for nausea or vomiting. 30 tablet 0     Review of Systems  Genitourinary: Positive for vaginal bleeding.   Physical Exam   Blood pressure 118/87, pulse 84, resp. rate 18, last menstrual period 09/02/2014, unknown if currently breastfeeding.  Physical Exam  MAU Course  Procedures  Care handed over to Illene Bolus, CNM  Assessment and Plan    LINEBERRY,SUSAN 04/01/2015, 7:30 PM

## 2015-04-01 NOTE — Discharge Instructions (Signed)
Miscarriage °A miscarriage is the sudden loss of an unborn baby (fetus) before the 20th week of pregnancy. Most miscarriages happen in the first 3 months of pregnancy. Sometimes, it happens before a woman even knows she is pregnant. A miscarriage is also called a "spontaneous miscarriage" or "early pregnancy loss." Having a miscarriage can be an emotional experience. Talk with your caregiver about any questions you may have about miscarrying, the grieving process, and your future pregnancy plans. °CAUSES  °· Problems with the fetal chromosomes that make it impossible for the baby to develop normally. Problems with the baby's genes or chromosomes are most often the result of errors that occur, by chance, as the embryo divides and grows. The problems are not inherited from the parents. °· Infection of the cervix or uterus.   °· Hormone problems.   °· Problems with the cervix, such as having an incompetent cervix. This is when the tissue in the cervix is not strong enough to hold the pregnancy.   °· Problems with the uterus, such as an abnormally shaped uterus, uterine fibroids, or congenital abnormalities.   °· Certain medical conditions.   °· Smoking, drinking alcohol, or taking illegal drugs.   °· Trauma.   °Often, the cause of a miscarriage is unknown.  °SYMPTOMS  °· Vaginal bleeding or spotting, with or without cramps or pain. °· Pain or cramping in the abdomen or lower back. °· Passing fluid, tissue, or blood clots from the vagina. °DIAGNOSIS  °Your caregiver will perform a physical exam. You may also have an ultrasound to confirm the miscarriage. Blood or urine tests may also be ordered. °TREATMENT  °· Sometimes, treatment is not necessary if you naturally pass all the fetal tissue that was in the uterus. If some of the fetus or placenta remains in the body (incomplete miscarriage), tissue left behind may become infected and must be removed. Usually, a dilation and curettage (D and C) procedure is performed.  During a D and C procedure, the cervix is widened (dilated) and any remaining fetal or placental tissue is gently removed from the uterus. °· Antibiotic medicines are prescribed if there is an infection. Other medicines may be given to reduce the size of the uterus (contract) if there is a lot of bleeding. °· If you have Rh negative blood and your baby was Rh positive, you will need a Rh immunoglobulin shot. This shot will protect any future baby from having Rh blood problems in future pregnancies. °HOME CARE INSTRUCTIONS  °· Your caregiver may order bed rest or may allow you to continue light activity. Resume activity as directed by your caregiver. °· Have someone help with home and family responsibilities during this time.   °· Keep track of the number of sanitary pads you use each day and how soaked (saturated) they are. Write down this information.   °· Do not use tampons. Do not douche or have sexual intercourse until approved by your caregiver.   °· Only take over-the-counter or prescription medicines for pain or discomfort as directed by your caregiver.   °· Do not take aspirin. Aspirin can cause bleeding.   °· Keep all follow-up appointments with your caregiver.   °· If you or your partner have problems with grieving, talk to your caregiver or seek counseling to help cope with the pregnancy loss. Allow enough time to grieve before trying to get pregnant again.   °SEEK IMMEDIATE MEDICAL CARE IF:  °· You have severe cramps or pain in your back or abdomen. °· You have a fever. °· You pass large blood clots (walnut-sized   or larger) or tissue from your vagina. Save any tissue for your caregiver to inspect.   °· Your bleeding increases.   °· You have a thick, bad-smelling vaginal discharge. °· You become lightheaded, weak, or you faint.   °· You have chills.   °MAKE SURE YOU: °· Understand these instructions. °· Will watch your condition. °· Will get help right away if you are not doing well or get  worse. °Document Released: 03/21/2001 Document Revised: 01/20/2013 Document Reviewed: 11/14/2011 °[employer]® Patient Information ©2015 [employer]. This information is not intended to replace advice given to you by your health care provider. Make sure you discuss any questions you have with your health care provider. ° °Incomplete Miscarriage °A miscarriage is the sudden loss of an unborn baby (fetus) before the 20th week of pregnancy. In an incomplete miscarriage, parts of the fetus or placenta (afterbirth) remain in the body.  °Having a miscarriage can be an emotional experience. Talk with your health care provider about any questions you may have about miscarrying, the grieving process, and your future pregnancy plans. °CAUSES  °· Problems with the fetal chromosomes that make it impossible for the baby to develop normally. Problems with the baby's genes or chromosomes are most often the result of errors that occur by chance as the embryo divides and grows. The problems are not inherited from the parents. °· Infection of the cervix or uterus. °· Hormone problems. °· Problems with the cervix, such as having an incompetent cervix. This is when the tissue in the cervix is not strong enough to hold the pregnancy. °· Problems with the uterus, such as an abnormally shaped uterus, uterine fibroids, or congenital abnormalities. °· Certain medical conditions. °· Smoking, drinking alcohol, or taking illegal drugs. °· Trauma. °SYMPTOMS  °· Vaginal bleeding or spotting, with or without cramps or pain. °· Pain or cramping in the abdomen or lower back. °· Passing fluid, tissue, or blood clots from the vagina. °DIAGNOSIS  °Your health care provider will perform a physical exam. You may also have an ultrasound to confirm the miscarriage. Blood or urine tests may also be ordered. °TREATMENT  °· Usually, a dilation and curettage (D&C) procedure is performed. During a D&C procedure, the cervix is widened (dilated) and any remaining  fetal or placental tissue is gently removed from the uterus. °· Antibiotic medicines are prescribed if there is an infection. Other medicines may be given to reduce the size of the uterus (contract) if there is a lot of bleeding. °· If you have Rh negative blood and your baby was Rh positive, you will need a Rho (D) immune globulin shot. This shot will protect any future baby from having Rh blood problems in future pregnancies. °· You may be confined to bed rest. This means you should stay in bed and only get up to use the bathroom. °HOME CARE INSTRUCTIONS  °· Rest as directed by your health care provider. °· Restrict activity as directed by your health care provider. You may be allowed to continue light activity if curettage was not done but you require further treatment. °· Keep track of the number of pads you use each day. Keep track of how soaked (saturated) they are. Record this information. °· Do not  use tampons. °· Do not douche or have sexual intercourse until approved by your health care provider. °· Keep all follow-up appointments for reevaluation and continuing management. °· Only take over-the-counter or prescription medicines for pain, fever, or discomfort as directed by your health care provider. °· Take   antibiotic medicine as directed by your health care provider. Make sure you finish it even if you start to feel better. SEEK IMMEDIATE MEDICAL CARE IF:   You experience severe cramps in your stomach, back, or abdomen.  You have an unexplained temperature (make sure to record these temperatures).  You pass large clots or tissue (save these for your health care provider to inspect).  Your bleeding increases.  You become light-headed, weak, or have fainting episodes. MAKE SURE YOU:   Understand these instructions.  Will watch your condition.  Will get help right away if you are not doing well or get worse. Document Released: 09/25/2005 Document Revised: 02/09/2014 Document Reviewed:  04/24/2013 [facility] Patient Information 2015 [place], [place]. This information is not intended to replace advice given to you by your health care provider. Make sure you discuss any questions you have with your health care provider. US Ob Transvaginal  04/01/2015   CLINICAL DATA:  Patient with bleeding and cramping. Prior ultrasound 02/17/2015 demonstrated intrauterine pregnancy.  EXAM: OBSTETRIC <14 WK [place] AND TRANSVAGINAL OB US  TECHNIQUE: Both transabdominal and transvaginal ultrasound examinations were performed for complete evaluation of the gestation as well as the maternal uterus, adnexal regions, and pelvic cul-de-sac. Transvaginal technique was performed to assess early pregnancy.  COMPARISON:  Ultrasound 02/17/2015  FINDINGS: Intrauterine gestational sac: Not present  Yolk sac:  Not present  Embryo:  Not present  Cardiac Activity: Not present  Maternal uterus/adnexae: No gestational sac is present within the endometrium. There is predominantly mixed echogenicity material demonstrated within the endometrial canal measuring up to 1.5 cm in thickness. This does not demonstrate significant associated blood flow on color Doppler imaging. The right ovary is unremarkable. Left ovary is not visualized. No free fluid in the pelvis.  IMPRESSION: No intrauterine gestational sac is identified.  There is predominantly echogenic material within the endometrium which may represent retained products of conception and/or associated blood products.  These results were called by telephone at the time of interpretation on 04/01/2015 at 11:16 pm to Dr. Jolaine Click, who verbally acknowledged these results.   Electronically Signed   By: Annia Belt M.D.   On: 04/01/2015 23:18

## 2015-04-15 ENCOUNTER — Encounter: Payer: Self-pay | Admitting: Family Medicine

## 2015-04-21 ENCOUNTER — Ambulatory Visit (INDEPENDENT_AMBULATORY_CARE_PROVIDER_SITE_OTHER): Payer: Medicaid Other | Admitting: Family Medicine

## 2015-04-21 ENCOUNTER — Encounter: Payer: Self-pay | Admitting: Clinical

## 2015-04-21 ENCOUNTER — Other Ambulatory Visit (HOSPITAL_COMMUNITY)
Admission: RE | Admit: 2015-04-21 | Discharge: 2015-04-21 | Disposition: A | Discharge status: Home / Self Care | Payer: Medicaid Other | Source: Ambulatory Visit | Attending: Family Medicine | Admitting: Family Medicine

## 2015-04-21 ENCOUNTER — Encounter: Payer: Self-pay | Admitting: Family Medicine

## 2015-04-21 VITALS — BP 123/76 | HR 81 | Temp 99.0°F | Resp 18 | Ht 64.96 in | Wt 198.0 lb

## 2015-04-21 DIAGNOSIS — O039 Complete or unspecified spontaneous abortion without complication: Secondary | ICD-10-CM | POA: Insufficient documentation

## 2015-04-21 DIAGNOSIS — Z0000 Encounter for general adult medical examination without abnormal findings: Secondary | ICD-10-CM | POA: Diagnosis not present

## 2015-04-21 DIAGNOSIS — Z124 Encounter for screening for malignant neoplasm of cervix: Secondary | ICD-10-CM | POA: Diagnosis not present

## 2015-04-21 DIAGNOSIS — Z01419 Encounter for gynecological examination (general) (routine) without abnormal findings: Secondary | ICD-10-CM | POA: Insufficient documentation

## 2015-04-21 DIAGNOSIS — F4321 Adjustment disorder with depressed mood: Secondary | ICD-10-CM | POA: Diagnosis not present

## 2015-04-21 DIAGNOSIS — Z1151 Encounter for screening for human papillomavirus (HPV): Secondary | ICD-10-CM | POA: Insufficient documentation

## 2015-04-21 NOTE — Assessment & Plan Note (Signed)
U/S from 6/23 revealed retained products of conception. Patient never followed up with repeat U/S as directed. No vaginal bleeding or abnormal discharge noted.  - repeat bhcg today  - scheduled f/u TV/US  - dicussed importance of going to this.

## 2015-04-21 NOTE — Assessment & Plan Note (Signed)
Unsure of this diagnosis, as from reviewing EMR, she's had the diagnosis of bipolar previously. No evidence of manic episodes. Currently the patient's mood seems appropriate given all the stress in her life. No SI currently. She would like medication but given the concerns of a previous h/o bipolar, would like for the patient to be thoroughly evaluated by a specialist prior to starting anything. - Seen by Christine Cervantes in integrated clinic, refer to her note - [facility]  - Advise to f/u in 1 month or sooner so we can assess her symptoms, patient voiced understanding.

## 2015-04-21 NOTE — Progress Notes (Signed)
Medical screening examination/treatment/procedure(s) were performed by the resident and as supervising physician I was immediately available for consultation/collaboration. 

## 2015-04-21 NOTE — Progress Notes (Signed)
Patient ID: Bettey Costamanda M Saks, female   DOB: September 14, 1986, 29 y.o.   MRN: [id number] 29 y.o. year old female presents for well woman/preventative visit and annual GYN examination.  Acute Concerns:  Elevated HR and BP: Notices she has bad headaches when her BPs are high.  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  She's checked her HR and its been up to 119 while she was  up moving around. Feels her HR is up when she's stressed, eating, and working. Notes she took 2 of her mother's HCTZ when she found her HR was up to 119.   Depression:  Has had 2 miscarriages in the last year, the last one this June. Notes feeling tired all the time, not wanting to do anything. Poor appetite. Stress with miscarriages. FOB has recently gotten out of prison. 439 y/o son has ADHD and has been acting out- was watching pornography on his phone recently, FOB didn't care. She recalls being on a medication in the past, but cannot recall the name.   Miscarriage: Patient miscarried. U/S at that time revealed: retained products of conception. Was supposed to go back for repeat imaging but didn't. Stopped bleeding a few days ago.   Social:  History   Social History  . Marital Status: Single    Spouse Name: N/A  . Number of Children: N/A  . Years of Education: N/A   Social History Main Topics  . Smoking status: Never Smoker   . Smokeless tobacco: Never Used  . Alcohol Use: 0.0 oz/week     Comment: occ before preg  . Drug Use: No  . Sexual Activity: Yes    Birth Control/ Protection: None     Comment: January 22 2015 last intercourse; ist depo in Jan 2016   Other Topics Concern  . None   Social History Narrative   Cancer Screening:  Pap Smear: 08/2012- negative for intraepithelial lesion or malignancy  Mammogram:  N/A  Colonoscopy: N/A  Dexa: N/A  Physical Exam: General: sitting up in in bed in NAD with daughter.  Eyes: Conjunctivae non-injected.  ENTM: Moist mucous membranes. Oropharynx clear. No nasal  discharge.  Neck: Supple, no LAD Cardiovascular: RRR. No murmurs, rubs, or gallops noted. No pitting edema noted. Respiratory: No increased WOB. CTAB without wheezing, rhonchi, or crackles noted. Abdomen: +BS, soft, non-distended, non-tender.  Breasts: breasts appear normal, no suspicious masses, no skin or nipple changes or axillary nodes. GYN:  External genitalia within normal limits.  Vaginal mucosa pink, moist, normal rugae.  Nonfriable cervix without lesions, no discharge or bleeding noted on speculum exam.  Bimanual exam revealed normal, nongravid uterus.  No cervical motion tenderness. No adnexal masses bilaterally.   MSK: Normal bulk and noted. No gross deformities noted.  Skin: No rashes noted  Neuro: A&O x4. No gross neurologic deficits. 2/4 patellar DTRs Psych:  Depressed mood, appropriate affect  ASSESSMENT & PLAN: 29 y.o. female presents for annual well woman/preventative exam and GYN exam. Please see problem specific assessment and plan.

## 2015-04-21 NOTE — Assessment & Plan Note (Signed)
Pap smear and breast exam performed today. Up to date on immunizations.  Continue to follow up BPs and HR given family h/o HTN

## 2015-04-21 NOTE — Progress Notes (Signed)
CSW received a referral to provide pt with resources regarding a recent miscarriage. CSW met with pt in the exam room and provided emotional support and validation of feelings. Pt states this is her second miscarriage (the first at 9 weeks and this one at 12 weeks.) Pt states she has been grieving the loss however has not sought counseling and or other resources to help her cope. Pt denies having any significant support systems however admits to drinking a beer in the evenings as an outlet. Pt reports being worried about starting unhealthy habits because of her depression. Pt states she feels guilty for being sad and less involved in activities with her children and requests medication from her provider. Pt also shared that she found pornographic web sites in her 9yo sons cell phone yesterday. Pt states she discussed this with her son in a loving manner however feels upset that his father is not very understanding as to why this is concerning. CSW provided active listening and encouraged pt to seek counseling for her child in order to provide appropriate attention/education and support. CSW also recommended constant supervision and communication as well as (password protected) limitting access or removal of the phone. Pt states she has already removed the phone from the child and agrees to provide a supportive and safe place for him to be honest with his temptations. Pt admits to also having feelings of anxiety, sleeping and crying more, less engaged in activities she used to enjoy and simply does not feel herself. Pt denied any feelings of SI, HI or psychosis. CSW provided education on counseling vs. Psychiatry and the need for her to process her many stressors (2 miscarriages, dropping out of college because of miscarriage, challenges with son, father of son getting out of jail etc.). Pt adamant that she wants provider to prescribe medication however very limited mental health hx in [facility]. CSW explained the  benefits of counseling for both her and her son to help learn new coping mechanisms. Pt understanding of recommendations and agreeable to seek outside resources. Pt given CSW's contact information for follow-up however pt did not seem as interested rather prefers medication and will therefore possibly go to [facility] for follow up.  Hunt Oris, MSW, [place]

## 2015-04-21 NOTE — Patient Instructions (Addendum)
Please get the repeat ultrasound Please follow up with me in 1 month, I'd like to see how you are doing  Things to do to Keep yourself Healthy - Exercise at least 30-45 minutes a day,  3-4 days a week.  - Eat a low-fat diet with lots of fruits and vegetables, up to 7-9 servings per day. - Seatbelts can save your life. Wear them always. - Smoke detectors on every level of your home, check batteries every year. - Eye Doctor - have an eye exam every 1-2 years - Safe sex - if you may be exposed to STDs, use a condom. - Alcohol If you drink, do it moderately,less than 2 drinks per day. - Health Care Power of Attorney.  Choose someone to speak for you if you are not able. - Depression is common in our stressful world.If you're feeling down or losing interest in things you normally enjoy, please come in for a visit. - Violence - If anyone is threatening or hurting you, please call immediately.

## 2015-04-22 LAB — HCG, QUANTITATIVE, PREGNANCY: HCG, BETA CHAIN, QUANT, S: 7.2 m[IU]/mL

## 2015-04-26 ENCOUNTER — Ambulatory Visit (HOSPITAL_COMMUNITY): Admission: RE | Admit: 2015-04-26 | Payer: Medicaid Other | Source: Ambulatory Visit

## 2015-04-26 ENCOUNTER — Ambulatory Visit (HOSPITAL_COMMUNITY): Payer: Medicaid Other | Attending: Family Medicine

## 2015-04-26 LAB — CYTOLOGY - PAP

## 2015-04-29 ENCOUNTER — Encounter: Payer: Self-pay | Admitting: Family Medicine

## 2015-05-03 ENCOUNTER — Telehealth: Payer: Self-pay | Admitting: Family Medicine

## 2015-05-03 NOTE — Telephone Encounter (Signed)
Pt is trying to have breast reduction. She has no insurance. She is trying to get her records showing she had back pain. Says the first time was in 2009 with Helane Rima and sometime in 2013---was given medicane of back pain Please call pt when records are ready Phone [phone]

## 2015-05-05 NOTE — Telephone Encounter (Signed)
Will forward to [employer].  Thanks, Joanna Puff, MD [facility] Resident  05/05/2015, 8:50 AM

## 2015-07-14 ENCOUNTER — Ambulatory Visit: Payer: Medicaid Other | Admitting: Diagnostic Neuroimaging

## 2015-07-15 ENCOUNTER — Encounter: Payer: Self-pay | Admitting: Diagnostic Neuroimaging

## 2015-08-08 ENCOUNTER — Inpatient Hospital Stay (HOSPITAL_COMMUNITY)
Admission: AD | Admit: 2015-08-08 | Discharge: 2015-08-08 | Disposition: A | Discharge status: Home / Self Care | Payer: Medicaid Other | Source: Ambulatory Visit | Attending: Family Medicine | Admitting: Family Medicine

## 2015-08-08 ENCOUNTER — Encounter (HOSPITAL_COMMUNITY): Payer: Self-pay | Admitting: *Deleted

## 2015-08-08 DIAGNOSIS — N760 Acute vaginitis: Secondary | ICD-10-CM | POA: Insufficient documentation

## 2015-08-08 DIAGNOSIS — N939 Abnormal uterine and vaginal bleeding, unspecified: Secondary | ICD-10-CM | POA: Diagnosis not present

## 2015-08-08 DIAGNOSIS — F329 Major depressive disorder, single episode, unspecified: Secondary | ICD-10-CM | POA: Insufficient documentation

## 2015-08-08 DIAGNOSIS — A499 Bacterial infection, unspecified: Secondary | ICD-10-CM | POA: Diagnosis not present

## 2015-08-08 DIAGNOSIS — B9689 Other specified bacterial agents as the cause of diseases classified elsewhere: Secondary | ICD-10-CM | POA: Insufficient documentation

## 2015-08-08 DIAGNOSIS — N926 Irregular menstruation, unspecified: Secondary | ICD-10-CM

## 2015-08-08 LAB — CBC
HCT: 37.1 % (ref 36.0–46.0)
Hemoglobin: 11.7 g/dL — ABNORMAL LOW (ref 12.0–15.0)
MCH: 26.1 pg (ref 26.0–34.0)
MCHC: 31.5 g/dL (ref 30.0–36.0)
MCV: 82.8 fL (ref 78.0–100.0)
PLATELETS: 187 10*3/uL (ref 150–400)
RBC: 4.48 MIL/uL (ref 3.87–5.11)
RDW: 14 % (ref 11.5–15.5)
WBC: 4.2 10*3/uL (ref 4.0–10.5)

## 2015-08-08 LAB — WET PREP, GENITAL
Trich, Wet Prep: NONE SEEN
Yeast Wet Prep HPF POC: NONE SEEN

## 2015-08-08 LAB — URINE MICROSCOPIC-ADD ON

## 2015-08-08 LAB — URINALYSIS, ROUTINE W REFLEX MICROSCOPIC
Bilirubin Urine: NEGATIVE
Glucose, UA: NEGATIVE mg/dL
Ketones, ur: 15 mg/dL — AB
LEUKOCYTES UA: NEGATIVE
NITRITE: NEGATIVE
Protein, ur: 30 mg/dL — AB
Specific Gravity, Urine: >1.03 — ABNORMAL HIGH (ref 1.005–1.030)
Urobilinogen, UA: 1 mg/dL (ref 0.0–1.0)
pH: 5.5 (ref 5.0–8.0)

## 2015-08-08 LAB — POCT PREGNANCY, URINE: Preg Test, Ur: NEGATIVE

## 2015-08-08 MED ORDER — IBUPROFEN 600 MG PO TABS: 600.0000 mg | ORAL_TABLET | Freq: Four times a day (QID) | ORAL | Status: DC | PRN

## 2015-08-08 MED ORDER — METRONIDAZOLE 500 MG PO TABS: 500.0000 mg | ORAL_TABLET | Freq: Two times a day (BID) | ORAL | Status: DC

## 2015-08-08 NOTE — MAU Provider Note (Signed)
History     CSN: [id number]  Arrival date and time: 08/08/15 1221   First Provider Initiated Contact with Patient 08/08/15 1322      Chief Complaint  Patient presents with  . Vaginal Bleeding   HPI Comments: Christine Cervantes is a 29 y.o. [phone] presenting with abnormal vaginal bleeding. LNMP was 07/16/2015 at expected time, normal flow and duration. This episode began 08/07/15 and associated with more cramps than usual, passing small clots and heavy flow. Used 4 pads since this am. Denies previous abnormal menses. Did not get F/U after first tri SAB 4 months ago. She asks to be checked for STDs and noted a malodorous discharge prior to onset of menses. (States this is her main concern.) Recent Pap at [facility] negative. Last GC/CT 02/08/15 both negative. Single partner. No contraception but not desirous of pregnancy. States depression stable off meds.    Vaginal Bleeding The patient's primary symptoms include a genital odor, vaginal bleeding and vaginal discharge. Chronicity: Onset early am 08/07/15. The current episode started in the past 7 days. The problem occurs constantly. The problem has been unchanged. The pain is moderate (Declines analgesia now. ). She is not pregnant. Associated symptoms include abdominal pain and back pain. Pertinent negatives include no chills, dysuria, fever, flank pain, frequency, headaches, hematuria or urgency. Associated symptoms comments: Cramps radiating to low back. The vaginal discharge was normal. The vaginal bleeding is heavier than menses. She has been passing clots. She has not been passing tissue. Nothing aggravates the symptoms. She has tried NSAIDs (ibuprofen 400mg  just prior to coming in) for the symptoms. The treatment provided moderate relief. She is sexually active. No, her partner does not have an STD. She uses nothing for contraception. Menstrual history: After first tri SAB 04/01/2015 had normal menses x 3 with q 4wk interval. Her past medical history is  significant for an abdominal surgery, miscarriage and an STD. There is no history of an ectopic pregnancy, endometriosis, herpes simplex, menorrhagia, metrorrhagia, PID or vaginosis.    OB History  Gravida Para Term Preterm AB SAB TAB Ectopic Multiple Living  6 3 2 1 2 2    3     # Outcome Date GA Lbr Len/2nd Weight Sex Delivery Anes PTL Lv  6 SAB 2016          5 Term 01/02/13 2480w3d 32:43 6 lb 3.1 oz (2.81 kg) F Vag-Spont EPI  Y  4 SAB 2011          3 Term 07/15/09 [id number]   F Vag-Spont EPI N Y     Comments: ? mass on fetal heart- no problems  2 Preterm 11/06/05 9274w0d   M Vag-Spont EPI Y Y     Comments: elevated BP  1 Gravida               Past Medical History  Diagnosis Date  . Chlamydia   . Trichimoniasis   . Gonorrhea   . Preterm labor   . Urinary tract infection   . Abnormal Pap smear     cryo on cervix  . Depression   . Headache(784.0)   . Anemia   . Pregnancy induced hypertension     Chronic hypertension    Past Surgical History  Procedure Laterality Date  . Foot surgery  2004-2004    corns removed from both feet, hammertoes repaired  . Gynecologic cryosurgery      Family History  Problem Relation Age of Onset  . Hypertension Mother   .  Hypertension Maternal Aunt   . Hypertension Maternal Grandmother   . Anesthesia problems Neg Hx   . Other Neg Hx   . Diabetes Paternal Grandmother     Social History  Substance Use Topics  . Smoking status: Never Smoker   . Smokeless tobacco: Never Used  . Alcohol Use: 0.0 oz/week     Comment: occ before preg    Allergies:  Allergies  Allergen Reactions  . Latex Swelling and Rash    Prescriptions prior to admission  Medication Sig Dispense Refill Last Dose  . ibuprofen (ADVIL,MOTRIN) 200 MG tablet Take 400 mg by mouth every 6 (six) hours as needed for moderate pain.    08/08/2015 at Unknown time  . promethazine (PHENERGAN) 12.5 MG tablet Take 1 tablet (12.5 mg total) by mouth every 6 (six) hours as needed for  nausea or vomiting. (Patient not taking: Reported on 04/01/2015) 30 tablet 0 Not Taking at Unknown time    Review of Systems  Constitutional: Positive for malaise/fatigue. Negative for fever and chills.  Gastrointestinal: Positive for abdominal pain.  Genitourinary: Positive for vaginal bleeding and vaginal discharge. Negative for dysuria, urgency, frequency, hematuria, flank pain and menorrhagia.  Musculoskeletal: Positive for back pain.  Neurological: Negative for dizziness, weakness and headaches.   Physical Exam   Blood pressure 135/84, pulse 83, temperature 98.5 F (36.9 C), resp. rate 18, last menstrual period 07/16/2015, unknown if currently breastfeeding.  Physical Exam  Nursing note and vitals reviewed. Constitutional: She is oriented to person, place, and time. She appears well-developed and well-nourished. No distress.  HENT:  Head: Normocephalic.  Eyes: Pupils are equal, round, and reactive to light.  Neck: Normal range of motion. Neck supple. No thyromegaly present.  Cardiovascular: Normal rate.   Respiratory: Effort normal.  GI: Soft. She exhibits no distension. There is no tenderness.  Genitourinary: Vagina normal and uterus normal. No vaginal discharge found.  NEFG Vagina with moderate blood, no clots, no discharge noted Cx: no lesions, mobile, NT Uterus: retroverted, small, NT Adnexae: no tenderness or masses   Musculoskeletal: She exhibits no edema.  Neurological: She is alert and oriented to person, place, and time.  Skin: Skin is warm and dry.  Psychiatric: She has a normal mood and affect. Her behavior is normal.    MAU Course  Procedures Results for orders placed or performed during the hospital encounter of 08/08/15 (from the past 24 hour(s))  Urinalysis, Routine w reflex microscopic (not at [facility])     Status: Abnormal   Collection Time: 08/08/15 12:25 PM  Result Value Ref Range   Color, Urine YELLOW YELLOW   APPearance CLEAR CLEAR   Specific  Gravity, Urine >1.030 (H) 1.005 - 1.030   pH 5.5 5.0 - 8.0   Glucose, UA NEGATIVE NEGATIVE mg/dL   Hgb urine dipstick LARGE (A) NEGATIVE   Bilirubin Urine NEGATIVE NEGATIVE   Ketones, ur 15 (A) NEGATIVE mg/dL   Protein, ur 30 (A) NEGATIVE mg/dL   Urobilinogen, UA 1.0 0.0 - 1.0 mg/dL   Nitrite NEGATIVE NEGATIVE   Leukocytes, UA NEGATIVE NEGATIVE  Urine microscopic-add on     Status: None   Collection Time: 08/08/15 12:25 PM  Result Value Ref Range   Squamous Epithelial / LPF RARE RARE   RBC / HPF TOO NUMEROUS TO COUNT <3 RBC/hpf   Bacteria, UA RARE RARE   Urine-Other MUCOUS PRESENT   Pregnancy, urine POC     Status: None   Collection Time: 08/08/15  1:26 PM  Result  Value Ref Range   Preg Test, Ur NEGATIVE NEGATIVE  Wet prep, genital     Status: Abnormal   Collection Time: 08/08/15  1:45 PM  Result Value Ref Range   Yeast Wet Prep HPF POC NONE SEEN NONE SEEN   Trich, Wet Prep NONE SEEN NONE SEEN   Clue Cells Wet Prep HPF POC MODERATE (A) NONE SEEN   WBC, Wet Prep HPF POC RARE (A) NONE SEEN  CBC     Status: Abnormal   Collection Time: 08/08/15  2:13 PM  Result Value Ref Range   WBC 4.2 4.0 - 10.5 K/uL   RBC 4.48 3.87 - 5.11 MIL/uL   Hemoglobin 11.7 (L) 12.0 - 15.0 g/dL   HCT 69.6 29.5 - 28.4 %   MCV 82.8 78.0 - 100.0 fL   MCH 26.1 26.0 - 34.0 pg   MCHC 31.5 30.0 - 36.0 g/dL   RDW 13.2 44.0 - 10.2 %   Platelets 187 150 - 400 K/uL  GC/CT sent    Assessment and Plan   1. Abnormal menses   2. BV (bacterial vaginosis)       Medication List    STOP taking these medications        promethazine 12.5 MG tablet  Commonly known as:  PHENERGAN      TAKE these medications        ibuprofen 600 MG tablet  Commonly known as:  ADVIL,MOTRIN  Take 1 tablet (600 mg total) by mouth every 6 (six) hours as needed.     metroNIDAZOLE 500 MG tablet  Commonly known as:  FLAGYL  Take 1 tablet (500 mg total) by mouth 2 (two) times daily.       Follow-up Information    Schedule  an appointment as soon as possible for a visit with [facility].   Why:  If symptoms worsen   Contact information:   [address] [place] [address] [phone]     Advised to take OTC PN vitamin if continues to not use contraception Keep menstrual calendar  Daisy Mcneel 08/08/2015, 1:23 PM

## 2015-08-08 NOTE — MAU Note (Signed)
Pt reports the bleeding actually began on Friday night/Saturday morning.

## 2015-08-08 NOTE — MAU Note (Signed)
Pt presents to MAU with complaints of vaginal bleeding that started yesterday. States her cycle has been normal until now with her last cycle being October the 7th. Had a miscarriage in may

## 2015-08-08 NOTE — Discharge Instructions (Signed)
Bacterial Vaginosis Bacterial vaginosis is a vaginal infection that occurs when the normal balance of bacteria in the vagina is disrupted. It results from an overgrowth of certain bacteria. This is the most common vaginal infection in women of childbearing age. Treatment is important to prevent complications, especially in pregnant women, as it can cause a premature delivery. CAUSES  Bacterial vaginosis is caused by an increase in harmful bacteria that are normally present in smaller amounts in the vagina. Several different kinds of bacteria can cause bacterial vaginosis. However, the reason that the condition develops is not fully understood. RISK FACTORS Certain activities or behaviors can put you at an increased risk of developing bacterial vaginosis, including:  Having a new sex partner or multiple sex partners.  Douching.  Using an intrauterine device (IUD) for contraception. Women do not get bacterial vaginosis from toilet seats, bedding, swimming pools, or contact with objects around them. SIGNS AND SYMPTOMS  Some women with bacterial vaginosis have no signs or symptoms. Common symptoms include:  Grey vaginal discharge.  A fishlike odor with discharge, especially after sexual intercourse.  Itching or burning of the vagina and vulva.  Burning or pain with urination. DIAGNOSIS  Your health care provider will take a medical history and examine the vagina for signs of bacterial vaginosis. A sample of vaginal fluid may be taken. Your health care provider will look at this sample under a microscope to check for bacteria and abnormal cells. A vaginal pH test may also be done.  TREATMENT  Bacterial vaginosis may be treated with antibiotic medicines. These may be given in the form of a pill or a vaginal cream. A second round of antibiotics may be prescribed if the condition comes back after treatment. Because bacterial vaginosis increases your risk for sexually transmitted diseases, getting  treated can help reduce your risk for chlamydia, gonorrhea, HIV, and herpes. HOME CARE INSTRUCTIONS   Only take over-the-counter or prescription medicines as directed by your health care provider.  If antibiotic medicine was prescribed, take it as directed. Make sure you finish it even if you start to feel better.  Tell all sexual partners that you have a vaginal infection. They should see their health care provider and be treated if they have problems, such as a mild rash or itching.  During treatment, it is important that you follow these instructions:  Avoid sexual activity or use condoms correctly.  Do not douche.  Avoid alcohol as directed by your health care provider.  Avoid breastfeeding as directed by your health care provider. SEEK MEDICAL CARE IF:   Your symptoms are not improving after 3 days of treatment.  You have increased discharge or pain.  You have a fever. MAKE SURE YOU:   Understand these instructions.  Will watch your condition.  Will get help right away if you are not doing well or get worse. FOR MORE INFORMATION  Centers for Disease Control and Prevention, Division of STD Prevention: SolutionApps.co.zawww.cdc.gov/std American Sexual Health Association (ASHA): www.ashastd.org    This information is not intended to replace advice given to you by your health care provider. Make sure you discuss any questions you have with your health care provider.   Document Released: 09/25/2005 Document Revised: 10/16/2014 Document Reviewed: 05/07/2013 Elsevier Interactive Patient Education [employer]2016 Elsevier Inc. Keep a menstrual calendar to bring to next visit

## 2015-08-09 LAB — GC/CHLAMYDIA PROBE AMP (~~LOC~~) NOT AT ARMC
Chlamydia: NEGATIVE
Neisseria Gonorrhea: NEGATIVE

## 2015-08-09 LAB — HIV ANTIBODY (ROUTINE TESTING W REFLEX): HIV Screen 4th Generation wRfx: NONREACTIVE

## 2015-09-04 ENCOUNTER — Encounter (HOSPITAL_COMMUNITY): Payer: Self-pay | Admitting: Medical

## 2015-09-04 ENCOUNTER — Inpatient Hospital Stay (HOSPITAL_COMMUNITY)
Admission: AD | Admit: 2015-09-04 | Discharge: 2015-09-04 | Disposition: A | Discharge status: Home / Self Care | Payer: Medicaid Other | Source: Ambulatory Visit | Attending: Obstetrics & Gynecology | Admitting: Obstetrics & Gynecology

## 2015-09-04 ENCOUNTER — Inpatient Hospital Stay (HOSPITAL_COMMUNITY): Payer: Medicaid Other

## 2015-09-04 DIAGNOSIS — M545 Low back pain: Secondary | ICD-10-CM | POA: Insufficient documentation

## 2015-09-04 DIAGNOSIS — Z3A01 Less than 8 weeks gestation of pregnancy: Secondary | ICD-10-CM | POA: Diagnosis not present

## 2015-09-04 DIAGNOSIS — A5901 Trichomonal vulvovaginitis: Secondary | ICD-10-CM

## 2015-09-04 DIAGNOSIS — O23591 Infection of other part of genital tract in pregnancy, first trimester: Secondary | ICD-10-CM

## 2015-09-04 DIAGNOSIS — O26899 Other specified pregnancy related conditions, unspecified trimester: Secondary | ICD-10-CM

## 2015-09-04 DIAGNOSIS — R1032 Left lower quadrant pain: Secondary | ICD-10-CM | POA: Insufficient documentation

## 2015-09-04 DIAGNOSIS — R109 Unspecified abdominal pain: Secondary | ICD-10-CM

## 2015-09-04 DIAGNOSIS — O26891 Other specified pregnancy related conditions, first trimester: Secondary | ICD-10-CM | POA: Diagnosis present

## 2015-09-04 HISTORY — DX: Unspecified abnormal cytological findings in specimens from vagina: R87.629

## 2015-09-04 LAB — URINALYSIS, ROUTINE W REFLEX MICROSCOPIC
Bilirubin Urine: NEGATIVE
GLUCOSE, UA: NEGATIVE mg/dL
Ketones, ur: NEGATIVE mg/dL
LEUKOCYTES UA: NEGATIVE
Nitrite: NEGATIVE
PH: 5.5 (ref 5.0–8.0)
PROTEIN: NEGATIVE mg/dL

## 2015-09-04 LAB — CBC WITH DIFFERENTIAL/PLATELET
BASOS PCT: 0 %
Basophils Absolute: 0 10*3/uL (ref 0.0–0.1)
Eosinophils Absolute: 0.1 10*3/uL (ref 0.0–0.7)
Eosinophils Relative: 1 %
HEMATOCRIT: 37.8 % (ref 36.0–46.0)
HEMOGLOBIN: 11.9 g/dL — AB (ref 12.0–15.0)
LYMPHS PCT: 24 %
Lymphs Abs: 1.5 10*3/uL (ref 0.7–4.0)
MCH: 25.8 pg — ABNORMAL LOW (ref 26.0–34.0)
MCHC: 31.5 g/dL (ref 30.0–36.0)
MCV: 81.8 fL (ref 78.0–100.0)
MONO ABS: 0.5 10*3/uL (ref 0.1–1.0)
MONOS PCT: 9 %
NEUTROS ABS: 4.2 10*3/uL (ref 1.7–7.7)
NEUTROS PCT: 66 %
Platelets: 213 10*3/uL (ref 150–400)
RBC: 4.62 MIL/uL (ref 3.87–5.11)
RDW: 13.7 % (ref 11.5–15.5)
WBC: 6.3 10*3/uL (ref 4.0–10.5)

## 2015-09-04 LAB — URINE MICROSCOPIC-ADD ON
BACTERIA UA: NONE SEEN
RBC / HPF: NONE SEEN RBC/hpf (ref 0–5)

## 2015-09-04 LAB — WET PREP, GENITAL
Sperm: NONE SEEN
YEAST WET PREP: NONE SEEN

## 2015-09-04 LAB — HCG, QUANTITATIVE, PREGNANCY: hCG, Beta Chain, Quant, S: 458 m[IU]/mL — ABNORMAL HIGH (ref <5)

## 2015-09-04 LAB — POCT PREGNANCY, URINE: Preg Test, Ur: POSITIVE — AB

## 2015-09-04 MED ORDER — METRONIDAZOLE 500 MG PO TABS
2000.0000 mg | ORAL_TABLET | Freq: Once | ORAL | Status: AC
  Administered 2015-09-04: 2000 mg via ORAL
  Filled 2015-09-04: qty 4

## 2015-09-04 NOTE — MAU Note (Addendum)
C/o lower back pain for about a month; also c/o intermittent lower abdominal pain for past week (started on the L side but now it feels like its straight across); positive UPT today;

## 2015-09-04 NOTE — Discharge Instructions (Signed)
Trichomoniasis Trichomoniasis is an infection caused by an organism called Trichomonas. The infection can affect both women and men. In women, the outer female genitalia and the vagina are affected. In men, the penis is mainly affected, but the prostate and other reproductive organs can also be involved. Trichomoniasis is a sexually transmitted infection (STI) and is most often passed to another person through sexual contact.  RISK FACTORS  Having unprotected sexual intercourse.  Having sexual intercourse with an infected partner. SIGNS AND SYMPTOMS  Symptoms of trichomoniasis in women include:  Abnormal gray-green frothy vaginal discharge.  Itching and irritation of the vagina.  Itching and irritation of the area outside the vagina. Symptoms of trichomoniasis in men include:   Penile discharge with or without pain.  Pain during urination. This results from inflammation of the urethra. DIAGNOSIS  Trichomoniasis may be found during a Pap test or physical exam. Your health care provider may use one of the following methods to help diagnose this infection:  Testing the pH of the vagina with a test tape.  Using a vaginal swab test that checks for the Trichomonas organism. A test is available that provides results within a few minutes.  Examining a urine sample.  Testing vaginal secretions. Your health care provider may test you for other STIs, including HIV. TREATMENT   You may be given medicine to fight the infection. Women should inform their health care provider if they could be or are pregnant. Some medicines used to treat the infection should not be taken during pregnancy.  Your health care provider may recommend over-the-counter medicines or creams to decrease itching or irritation.  Your sexual partner will need to be treated if infected.  Your health care provider may test you for infection again 3 months after treatment. HOME CARE INSTRUCTIONS   Take medicines only as  directed by your health care provider.  Take over-the-counter medicine for itching or irritation as directed by your health care provider.  Do not have sexual intercourse while you have the infection.  Women should not douche or wear tampons while they have the infection.  Discuss your infection with your partner. Your partner may have gotten the infection from you, or you may have gotten it from your partner.  Have your sex partner get examined and treated if necessary.  Practice safe, informed, and protected sex.  See your health care provider for other STI testing. SEEK MEDICAL CARE IF:   You still have symptoms after you finish your medicine.  You develop abdominal pain.  You have pain when you urinate.  You have bleeding after sexual intercourse.  You develop a rash.  Your medicine makes you sick or makes you throw up (vomit). MAKE SURE YOU:  Understand these instructions.  Will watch your condition.  Will get help right away if you are not doing well or get worse.   This information is not intended to replace advice given to you by your health care provider. Make sure you discuss any questions you have with your health care provider.   Document Released: 03/21/2001 Document Revised: 10/16/2014 Document Reviewed: 07/07/2013 Elsevier Interactive Patient Education 2016 Elsevier Inc. Vaginal Bleeding During Pregnancy, First Trimester A small amount of bleeding (spotting) from the vagina is relatively common in early pregnancy. It usually stops on its own. Various things may cause bleeding or spotting in early pregnancy. Some bleeding may be related to the pregnancy, and some may not. In most cases, the bleeding is normal and is not a  problem. However, bleeding can also be a sign of something serious. Be sure to tell your health care provider about any vaginal bleeding right away. Some possible causes of vaginal bleeding during the first trimester include:  Infection or  inflammation of the cervix.  Growths (polyps) on the cervix.  Miscarriage or threatened miscarriage.  Pregnancy tissue has developed outside of the uterus and in a fallopian tube (tubal pregnancy).  Tiny cysts have developed in the uterus instead of pregnancy tissue (molar pregnancy). HOME CARE INSTRUCTIONS  Watch your condition for any changes. The following actions may help to lessen any discomfort you are feeling:  Follow your health care provider's instructions for limiting your activity. If your health care provider orders bed rest, you may need to stay in bed and only get up to use the bathroom. However, your health care provider may allow you to continue light activity.  If needed, make plans for someone to help with your regular activities and responsibilities while you are on bed rest.  Keep track of the number of pads you use each day, how often you change pads, and how soaked (saturated) they are. Write this down.  Do not use tampons. Do not douche.  Do not have sexual intercourse or orgasms until approved by your health care provider.  If you pass any tissue from your vagina, save the tissue so you can show it to your health care provider.  Only take over-the-counter or prescription medicines as directed by your health care provider.  Do not take aspirin because it can make you bleed.  Keep all follow-up appointments as directed by your health care provider. SEEK MEDICAL CARE IF:  You have any vaginal bleeding during any part of your pregnancy.  You have cramps or labor pains.  You have a fever, not controlled by medicine. SEEK IMMEDIATE MEDICAL CARE IF:   You have severe cramps in your back or belly (abdomen).  You pass large clots or tissue from your vagina.  Your bleeding increases.  You feel light-headed or weak, or you have fainting episodes.  You have chills.  You are leaking fluid or have a gush of fluid from your vagina.  You pass out while having  a bowel movement. MAKE SURE YOU:  Understand these instructions.  Will watch your condition.  Will get help right away if you are not doing well or get worse.   This information is not intended to replace advice given to you by your health care provider. Make sure you discuss any questions you have with your health care provider.   Document Released: 07/05/2005 Document Revised: 09/30/2013 Document Reviewed: 06/02/2013 Elsevier Interactive Patient Education 2016 [employer]. Expedited Partner Therapy:  Information Sheet for Patients and Partners               You have been offered expedited partner therapy (EPT). This information sheet contains important information and warnings you need to be aware of, so please read it carefully.   Expedited Partner Therapy (EPT) is the clinical practice of treating the sexual partners of persons who receive chlamydia, gonorrhea, or trichomoniasis diagnoses by providing medications or prescriptions to the patient. Patients then provide partners with these therapies without the health-care provider having examined the partner. In other words, EPT is a convenient, fast and private way for patients to help their sexual partners get treated.   Chlamydia and gonorrhea are bacterial infections you get from having sex with a person who is already infected. Trichomoniasis (or  trich) is a very common sexually transmitted infection (STI) that is caused by infection with a protozoan parasite called Trichomonas vaginalis.  Many people with these infections dont know it because they feel fine, but without treatment these infections can cause serious health problems, such as pelvic inflammatory disease, ectopic pregnancy, infertility and increased risk of HIV.   It is important to get treated as soon as possible to protect your health, to avoid spreading these infections to others, and to prevent yourself from becoming re-infected. The good news is these infections  can be easily cured with proper antibiotic medicine. The best way to take care of your self is to see a doctor or go to your local health department. If you are not able to see a doctor or other medical provider, you should take EPT.    Recommended Medication: EPT for Chlamydia:  Azithromycin (Zithromax) 1 gram orally in a single dose EPT for Gonorrhea:  Cefixime (Suprax) 400 milligrams orally in a single dose PLUS azithromycin (Zithromax) 1 gram orally in a single dose EPT for Trichomoniasis:  Metronidazole (Flagyl) 2 grams orally in a single dose   These medicines are very safe. However, you should not take them if you have ever had an allergic reaction (like a rash) to any of these medicines: azithromycin (Zithromax), erythromycin, clarithromycin (Biaxin), metronidazole (Flagyl), tinidazole (Tindimax). If you are uncertain about whether you have an allergy, call your medical provider or pharmacist before taking this medicine. If you have a serious, long-term illness like kidney, liver or heart disease, colitis or stomach problems, or you are currently taking other prescription medication, talk to your provider before taking this medication.   Women: If you have lower belly pain, pain during sex, vomiting, or a fever, do not take this medicine. Instead, you should see a medical provider to be certain you do not have pelvic inflammatory disease (PID). PID can be serious and lead to infertility, pregnancy problems or chronic pelvic pain.   Pregnant Women: It is very important for you to see a doctor to get pregnancy services and pre-natal care. These antibiotics for EPT are safe for pregnant women, but you still need to see a medical provider as soon as possible. It is also important to note that Doxycycline is an alternative therapy for chlamydia, but it should not be taken by someone who is pregnant.   Men: If you have pain or swelling in the testicles or a fever, do not take this medicine and see a  medical provider.     Men who have sex with men (MSM): MSM in [place] continue to experience high rates of syphilis and HIV. Many MSM with gonorrhea or chlamydia could also have syphilis and/or HIV and not know it. If you are a man who has sex with other men, it is very important that you see a medical provider and are tested for HIV and syphilis. EPT is not recommended for gonorrhea for MSM.  Recommended treatment for gonorrhea for MSM is Rocephin (shot) AND azithromycin due to decreased cure rate.  Please see your medical provider if this is the case.    Along with this information sheet is a prescription for the medicine. If you receive a prescription it will be in your name and will indicate your date of birth, or it will be in the name of Expedited Partner Therapy.   In either case, you can have the prescription filled at a pharmacy. You will be responsible for the cost  of the medicine, unless you have prescription drug coverage. In that case, you could provide your name so the pharmacy could bill your health plan.   Take the medication as directed. Some people will have a mild, upset stomach, which does not last long. AVOID alcohol 24 hours after taking metronidazole (Flagyl) to reduce the possibility of a disulfiram-like reaction (severe vomiting and abdominal pain).  After taking the medicine, do not have sex for 7 days. Do not share this medicine or give it to anyone else. It is important to tell everyone you have had sex with in the last 60 days that they need to go and get tested for sexually transmitted infections.   Ways to prevent these and other sexually transmitted infections (STIs):    Abstain from sex. This is the only sure way to avoid getting an STI.   Use barrier methods, such as condoms, consistently and correctly.   Limit the number of sexual partners.   Have regular physical exams, including testing for STIs.   For more information about EPT or other issues  pertaining to an STI, please contact your medical provider or the [facility] Department at [phone] or [email or web address]

## 2015-09-04 NOTE — MAU Provider Note (Signed)
History     CSN: [id number]  Arrival date and time: 09/04/15 [id number]   First Provider Initiated Contact with Patient 09/04/15 [phone]      Chief Complaint  Patient presents with  . Back Pain  . Fatigue   HPI Ms. Bettey Costamanda M Dingee is a 29 y.o. [id number] at [id number] who presents to MAU today with complaint of low back pain and lower abdominal pain. The patient states low back pain x 1 month. 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 She denies flank pain or UTI symptoms. She states LMP 08/07/15 which was very heavy. This was her second period in October. She states that she noted some spotting yesterday as well. She had not taken HPT. She rates back pain at 9/10 now. She took Ibuprofen and Tylenol today for pain with minimal relief. She denies fever, N/V/D or constipation. She states lower abdominal pain started in the LLQ and has radiated across the abdomen.    OB History    Gravida Para Term Preterm AB TAB SAB Ectopic Multiple Living   6 3 2 1 2  2   3       Past Medical History  Diagnosis Date  . Chlamydia   . Trichimoniasis   . Gonorrhea   . Preterm labor   . Urinary tract infection   . Abnormal Pap smear     cryo on cervix  . Depression   . Headache(784.0)   . Anemia   . Pregnancy induced hypertension     Chronic hypertension  . Vaginal Pap smear, abnormal     Past Surgical History  Procedure Laterality Date  . Foot surgery  2004-2004    corns removed from both feet, hammertoes repaired  . Gynecologic cryosurgery      Family History  Problem Relation Age of Onset  . Hypertension Mother   . Hypertension Maternal Aunt   . Hypertension Maternal Grandmother   . Anesthesia problems Neg Hx   . Other Neg Hx   . Diabetes Paternal Grandmother     Social History  Substance Use Topics  . Smoking status: Never Smoker   . Smokeless tobacco: Never Used  . Alcohol Use: 0.0 oz/week     Comment: occ before preg    Allergies:  Allergies  Allergen Reactions  . Latex  Swelling and Rash    Prescriptions prior to admission  Medication Sig Dispense Refill Last Dose  . acetaminophen (TYLENOL) 500 MG tablet Take 500 mg by mouth every 6 (six) hours as needed for moderate pain.    09/04/2015 at Unknown time  . ibuprofen (ADVIL,MOTRIN) 200 MG tablet Take 200 mg by mouth every 6 (six) hours as needed for mild pain.   09/04/2015 at Unknown time  . ibuprofen (ADVIL,MOTRIN) 600 MG tablet Take 1 tablet (600 mg total) by mouth every 6 (six) hours as needed. (Patient not taking: Reported on 09/04/2015) 20 tablet 1 Not Taking at Unknown time  . metroNIDAZOLE (FLAGYL) 500 MG tablet Take 1 tablet (500 mg total) by mouth 2 (two) times daily. (Patient not taking: Reported on 09/04/2015) 14 tablet 0 Not Taking at Unknown time    Review of Systems  Constitutional: Negative for fever and malaise/fatigue.  Gastrointestinal: Positive for abdominal pain. Negative for nausea, vomiting, diarrhea and constipation.  Genitourinary: Negative for dysuria, urgency and frequency.       + spotting Neg - vaginal discharge   Physical Exam   Blood pressure 141/91, pulse 79, temperature  98.3 F (36.8 C), temperature source Oral, height  (1.626 m), weight 200 lb (90.719 kg), last menstrual period 08/07/2015, unknown if currently breastfeeding.  Physical Exam  Nursing note and vitals reviewed. Constitutional: She is oriented to person, place, and time. She appears well-developed and well-nourished. No distress.  HENT:  Head: Normocephalic and atraumatic.  Cardiovascular: Normal rate.   Respiratory: Effort normal.  GI: Soft. She exhibits no distension and no mass. There is no tenderness. There is no rebound, no guarding and no CVA tenderness.  Genitourinary: Uterus is not enlarged and not tender. Cervix exhibits friability (mild). Cervix exhibits no motion tenderness and no discharge. Right adnexum displays no mass and no tenderness. Left adnexum displays no mass and no tenderness. No  bleeding in the vagina. Vaginal discharge (small amount of thin, white discharge) found.  Musculoskeletal:       Lumbar back: She exhibits tenderness and pain. She exhibits normal range of motion, no swelling, no edema and no spasm.  Neurological: She is alert and oriented to person, place, and time.  Skin: Skin is warm and dry. No erythema.  Psychiatric: She has a normal mood and affect.    Results for orders placed or performed during the hospital encounter of 09/04/15 (from the past 24 hour(s))  Pregnancy, urine POC     Status: Abnormal   Collection Time: 09/04/15  8:38 AM  Result Value Ref Range   Preg Test, Ur POSITIVE (A) NEGATIVE  CBC with Differential/Platelet     Status: Abnormal   Collection Time: 09/04/15  8:55 AM  Result Value Ref Range   WBC 6.3 4.0 - 10.5 K/uL   RBC 4.62 3.87 - 5.11 MIL/uL   Hemoglobin 11.9 (L) 12.0 - 15.0 g/dL   HCT 16.1 09.6 - 04.5 %   MCV 81.8 78.0 - 100.0 fL   MCH 25.8 (L) 26.0 - 34.0 pg   MCHC 31.5 30.0 - 36.0 g/dL   RDW 40.9 81.1 - 91.4 %   Platelets 213 150 - 400 K/uL   Neutrophils Relative % 66 %   Neutro Abs 4.2 1.7 - 7.7 K/uL   Lymphocytes Relative 24 %   Lymphs Abs 1.5 0.7 - 4.0 K/uL   Monocytes Relative 9 %   Monocytes Absolute 0.5 0.1 - 1.0 K/uL   Eosinophils Relative 1 %   Eosinophils Absolute 0.1 0.0 - 0.7 K/uL   Basophils Relative 0 %   Basophils Absolute 0.0 0.0 - 0.1 K/uL   No results found.  MAU Course  Procedures None  MDM +UPT UA, wet prep, GC/chlamydia, CBC, ABO/Rh, quant hCG, HIV, RPR and [place] today to rule out ectopic pregnancy 0909 - Labs pending. Patient waiting for [place]. Care turned over to [facility], CNM   Marny Lowenstein, PA-C  09/04/2015, 9:09 AM  Assessment and Plan   Assumed care  Results for orders placed or performed during the hospital encounter of 09/04/15 (from the past 24 hour(s))  Urinalysis, Routine w reflex microscopic (not at [facility])     Status: Abnormal   Collection Time: 09/04/15  8:36 AM   Result Value Ref Range   Color, Urine YELLOW YELLOW   APPearance CLEAR CLEAR   Specific Gravity, Urine >1.030 (H) 1.005 - 1.030   pH 5.5 5.0 - 8.0   Glucose, UA NEGATIVE NEGATIVE mg/dL   Hgb urine dipstick SMALL (A) NEGATIVE   Bilirubin Urine NEGATIVE NEGATIVE   Ketones, ur NEGATIVE NEGATIVE mg/dL   Protein, ur NEGATIVE NEGATIVE mg/dL  Nitrite NEGATIVE NEGATIVE   Leukocytes, UA NEGATIVE NEGATIVE  Urine microscopic-add on     Status: Abnormal   Collection Time: 09/04/15  8:36 AM  Result Value Ref Range   Squamous Epithelial / LPF 0-5 (A) NONE SEEN   WBC, UA 0-5 0 - 5 WBC/hpf   RBC / HPF NONE SEEN 0 - 5 RBC/hpf   Bacteria, UA NONE SEEN NONE SEEN   Urine-Other MUCOUS PRESENT   Pregnancy, urine POC     Status: Abnormal   Collection Time: 09/04/15  8:38 AM  Result Value Ref Range   Preg Test, Ur POSITIVE (A) NEGATIVE  CBC with Differential/Platelet     Status: Abnormal   Collection Time: 09/04/15  8:55 AM  Result Value Ref Range   WBC 6.3 4.0 - 10.5 K/uL   RBC 4.62 3.87 - 5.11 MIL/uL   Hemoglobin 11.9 (L) 12.0 - 15.0 g/dL   HCT 84.6 96.2 - 95.2 %   MCV 81.8 78.0 - 100.0 fL   MCH 25.8 (L) 26.0 - 34.0 pg   MCHC 31.5 30.0 - 36.0 g/dL   RDW 84.1 32.4 - 40.1 %   Platelets 213 150 - 400 K/uL   Neutrophils Relative % 66 %   Neutro Abs 4.2 1.7 - 7.7 K/uL   Lymphocytes Relative 24 %   Lymphs Abs 1.5 0.7 - 4.0 K/uL   Monocytes Relative 9 %   Monocytes Absolute 0.5 0.1 - 1.0 K/uL   Eosinophils Relative 1 %   Eosinophils Absolute 0.1 0.0 - 0.7 K/uL   Basophils Relative 0 %   Basophils Absolute 0.0 0.0 - 0.1 K/uL  hCG, quantitative, pregnancy     Status: Abnormal   Collection Time: 09/04/15  8:55 AM  Result Value Ref Range   hCG, Beta Chain, Quant, S 458 (H) <5 mIU/mL  Wet prep, genital     Status: Abnormal   Collection Time: 09/04/15  9:00 AM  Result Value Ref Range   Yeast Wet Prep HPF POC NONE SEEN NONE SEEN   Trich, Wet Prep PRESENT (A) NONE SEEN   Clue Cells Wet Prep HPF  POC PRESENT (A) NONE SEEN   WBC, Wet Prep HPF POC MANY (A) NONE SEEN   Sperm NONE SEEN    Discussed quant level and inability to see pregnancy on ultrasound at this level [place] did not seen GS or YS  Discussed diagnosis of trichomonal vaginitis Reviewed October test results which only showed BV. Patient never got Rx filled Discussed that this is the same medicine but different diagnosis  Discussed Expedited partner treatement Instructions for that included in D/C instructions Rx for Flagyl provided.  A:  Pregnancy at [redacted]w[redacted]d by LMP      Pregnancy of unknown location      Trichomonal vaginitis  P:  Discharge home      Plan recheck quant HCG here in 2 days then will follow in clinic per new protocol      Ectopic precautions       Expedited Partner Rx provided

## 2015-09-05 LAB — HIV ANTIBODY (ROUTINE TESTING W REFLEX): HIV Screen 4th Generation wRfx: NONREACTIVE

## 2015-09-05 LAB — RPR: RPR: NONREACTIVE

## 2015-09-06 LAB — GC/CHLAMYDIA PROBE AMP (~~LOC~~) NOT AT ARMC
CHLAMYDIA, DNA PROBE: NEGATIVE
Neisseria Gonorrhea: NEGATIVE

## 2015-09-07 ENCOUNTER — Inpatient Hospital Stay (HOSPITAL_COMMUNITY)
Admission: AD | Admit: 2015-09-07 | Discharge: 2015-09-07 | Disposition: A | Discharge status: Home / Self Care | Payer: Medicaid Other | Source: Ambulatory Visit | Attending: Obstetrics & Gynecology | Admitting: Obstetrics & Gynecology

## 2015-09-07 DIAGNOSIS — O26891 Other specified pregnancy related conditions, first trimester: Secondary | ICD-10-CM | POA: Diagnosis not present

## 2015-09-07 DIAGNOSIS — M549 Dorsalgia, unspecified: Secondary | ICD-10-CM | POA: Diagnosis not present

## 2015-09-07 DIAGNOSIS — O26851 Spotting complicating pregnancy, first trimester: Secondary | ICD-10-CM | POA: Insufficient documentation

## 2015-09-07 DIAGNOSIS — Z3A01 Less than 8 weeks gestation of pregnancy: Secondary | ICD-10-CM | POA: Diagnosis not present

## 2015-09-07 DIAGNOSIS — O9989 Other specified diseases and conditions complicating pregnancy, childbirth and the puerperium: Secondary | ICD-10-CM

## 2015-09-07 DIAGNOSIS — O99891 Other specified diseases and conditions complicating pregnancy: Secondary | ICD-10-CM

## 2015-09-07 LAB — HCG, QUANTITATIVE, PREGNANCY: HCG, BETA CHAIN, QUANT, S: 1921 m[IU]/mL — AB (ref <5)

## 2015-09-07 NOTE — Discharge Instructions (Signed)
Back Pain in Pregnancy °Back pain during pregnancy is common. It happens in about half of all pregnancies. It is important for you and your baby that you remain active during your pregnancy. If you feel that back pain is not allowing you to remain active or sleep well, it is time to see your caregiver. Back pain may be caused by several factors related to changes during your pregnancy. Fortunately, unless you had trouble with your back before your pregnancy, the pain is likely to get better after you deliver. °Low back pain usually occurs between the fifth and seventh months of pregnancy. It can, however, happen in the first couple months. Factors that increase the risk of back problems include:  °· Previous back problems. °· Injury to your back. °· Having twins or multiple births. °· A chronic cough. °· Stress. °· Job-related repetitive motions. °· Muscle or spinal disease in the back. °· Family history of back problems, ruptured (herniated) discs, or osteoporosis. °· Depression, anxiety, and panic attacks. °CAUSES  °· When you are pregnant, your body produces a hormone called relaxin. This hormone makes the ligaments connecting the low back and pubic bones more flexible. This flexibility allows the baby to be delivered more easily. When your ligaments are loose, your muscles need to work harder to support your back. Soreness in your back can come from tired muscles. Soreness can also come from back tissues that are irritated since they are receiving less support. °· As the baby grows, it puts pressure on the nerves and blood vessels in your pelvis. This can cause back pain. °· As the baby grows and gets heavier during pregnancy, the uterus pushes the stomach muscles forward and changes your center of gravity. This makes your back muscles work harder to maintain good posture. °SYMPTOMS  °Lumbar pain during pregnancy °Lumbar pain during pregnancy usually occurs at or above the waist in the center of the back. There  may be pain and numbness that radiates into your leg or foot. This is similar to low back pain experienced by non-pregnant women. It usually increases with sitting for long periods of time, standing, or repetitive lifting. Tenderness may also be present in the muscles along your upper back. °Posterior pelvic pain during pregnancy °Pain in the back of the pelvis is more common than lumbar pain in pregnancy. It is a deep pain felt in your side at the waistline, or across the tailbone (sacrum), or in both places. You may have pain on one or both sides. This pain can also go into the buttocks and backs of the upper thighs. Pubic and groin pain may also be present. The pain does not quickly resolve with rest, and morning stiffness may also be present. °Pelvic pain during pregnancy can be brought on by most activities. A high level of fitness before and during pregnancy may or may not prevent this problem. Labor pain is usually 1 to 2 minutes apart, lasts for about 1 minute, and involves a bearing down feeling or pressure in your pelvis. However, if you are at term with the pregnancy, constant low back pain can be the beginning of early labor, and you should be aware of this. °DIAGNOSIS  °X-rays of the back should not be done during the first 12 to 14 weeks of the pregnancy and only when absolutely necessary during the rest of the pregnancy. MRIs do not give off radiation and are safe during pregnancy. MRIs also should only be done when absolutely necessary. °HOME CARE INSTRUCTIONS °· Exercise   as directed by your caregiver. Exercise is the most effective way to prevent or manage back pain. If you have a back problem, it is especially important to avoid sports that require sudden body movements. Swimming and walking are great activities. °· Do not stand in one place for long periods of time. °· Do not wear high heels. °· Sit in chairs with good posture. Use a pillow on your lower back if necessary. Make sure your head  rests over your shoulders and is not hanging forward. °· Try sleeping on your side, preferably the left side, with a pillow or two between your legs. If you are sore after a night's rest, your bed may be too soft. Try placing a board between your mattress and box spring. °· Listen to your body when lifting. If you are experiencing pain, ask for help or try bending your knees more so you can use your leg muscles rather than your back muscles. Squat down when picking up something from the floor. Do not bend over. °· Eat a healthy diet. Try to gain weight within your caregiver's recommendations. °· Use heat or cold packs 3 to 4 times a day for 15 minutes to help with the pain. °· Only take over-the-counter or prescription medicines for pain, discomfort, or fever as directed by your caregiver. °Sudden (acute) back pain °· Use bed rest for only the most extreme, acute episodes of back pain. Prolonged bed rest over 48 hours will aggravate your condition. °· Ice is very effective for acute conditions. °· Put ice in a plastic bag. °· Place a towel between your skin and the bag. °· Leave the ice on for 10 to 20 minutes every 2 hours, or as needed. °· Using heat packs for 30 minutes prior to activities is also helpful. °Continued back pain °See your caregiver if you have continued problems. Your caregiver can help or refer you for appropriate physical therapy. With conditioning, most back problems can be avoided. Sometimes, a more serious issue may be the cause of back pain. You should be seen right away if new problems seem to be developing. Your caregiver may recommend: °· A maternity girdle. °· An elastic sling. °· A back brace. °· A massage therapist or acupuncture. °SEEK MEDICAL CARE IF:  °· You are not able to do most of your daily activities, even when taking the pain medicine you were given. °· You need a referral to a physical therapist or chiropractor. °· You want to try acupuncture. °SEEK IMMEDIATE MEDICAL CARE  IF: °· You develop numbness, tingling, weakness, or problems with the use of your arms or legs. °· You develop severe back pain that is no longer relieved with medicines. °· You have a sudden change in bowel or bladder control. °· You have increasing pain in other areas of the body. °· You develop shortness of breath, dizziness, or fainting. °· You develop nausea, vomiting, or sweating. °· You have back pain which is similar to labor pains. °· You have back pain along with your water breaking or vaginal bleeding. °· You have back pain or numbness that travels down your leg. °· Your back pain developed after you fell. °· You develop pain on one side of your back. You may have a kidney stone. °· You see blood in your urine. You may have a bladder infection or kidney stone. °· You have back pain with blisters. You may have shingles. °Back pain is fairly common during pregnancy but should not be accepted as just part of   the process. Back pain should always be treated as soon as possible. This will make your pregnancy as pleasant as possible.   This information is not intended to replace advice given to you by your health care provider. Make sure you discuss any questions you have with your health care provider.   Document Released: 01/03/2006 Document Revised: 12/18/2011 Document Reviewed: 02/14/2011 Elsevier Interactive Patient Education 2016 Else[employer] Inc. Vaginal Bleeding During Pregnancy, First Trimester A small amount of bleeding (spotting) from the vagina is relatively common in early pregnancy. It usually stops on its own. Various things may cause bleeding or spotting in early pregnancy. Some bleeding may be related to the pregnancy, and some may not. In most cases, the bleeding is normal and is not a problem. However, bleeding can also be a sign of something serious. Be sure to tell your health care provider about any vaginal bleeding right away. Some possible causes of vaginal bleeding during the first  trimester include:  Infection or inflammation of the cervix.  Growths (polyps) on the cervix.  Miscarriage or threatened miscarriage.  Pregnancy tissue has developed outside of the uterus and in a fallopian tube (tubal pregnancy).  Tiny cysts have developed in the uterus instead of pregnancy tissue (molar pregnancy). HOME CARE INSTRUCTIONS  Watch your condition for any changes. The following actions may help to lessen any discomfort you are feeling:  Follow your health care provider's instructions for limiting your activity. If your health care provider orders bed rest, you may need to stay in bed and only get up to use the bathroom. However, your health care provider may allow you to continue light activity.  If needed, make plans for someone to help with your regular activities and responsibilities while you are on bed rest.  Keep track of the number of pads you use each day, how often you change pads, and how soaked (saturated) they are. Write this down.  Do not use tampons. Do not douche.  Do not have sexual intercourse or orgasms until approved by your health care provider.  If you pass any tissue from your vagina, save the tissue so you can show it to your health care provider.  Only take over-the-counter or prescription medicines as directed by your health care provider.  Do not take aspirin because it can make you bleed.  Keep all follow-up appointments as directed by your health care provider. SEEK MEDICAL CARE IF:  You have any vaginal bleeding during any part of your pregnancy.  You have cramps or labor pains.  You have a fever, not controlled by medicine. SEEK IMMEDIATE MEDICAL CARE IF:   You have severe cramps in your back or belly (abdomen).  You pass large clots or tissue from your vagina.  Your bleeding increases.  You feel light-headed or weak, or you have fainting episodes.  You have chills.  You are leaking fluid or have a gush of fluid from your  vagina.  You pass out while having a bowel movement. MAKE SURE YOU:  Understand these instructions.  Will watch your condition.  Will get help right away if you are not doing well or get worse.   This information is not intended to replace advice given to you by your health care provider. Make sure you discuss any questions you have with your health care provider.   Document Released: 07/05/2005 Document Revised: 09/30/2013 Document Reviewed: 06/02/2013 Elsevier Interactive Patient Education [employer] Else[employer] Inc[employer]

## 2015-09-07 NOTE — MAU Provider Note (Signed)
S: 29 y.o. [id number] @[redacted]w[redacted]d  by LMP presents to MAU for repeat hcg.  She presented on 09/07/15 with spotting, abdominal, and back pain in pregnancy.  She reports mild back pain and pink spotting today.  Her quant hcg on 11/26 was 458 and ultrasound showed no IUP or ectopic pregnancy.  Vaginal Bleeding The patient's primary symptoms include vaginal bleeding. The patient's pertinent negatives include no pelvic pain. This is a new problem. The current episode started in the past 7 days. The problem occurs constantly. The problem has been unchanged. The pain is mild. She is pregnant. Associated symptoms include abdominal pain and back pain. Pertinent negatives include no diarrhea, dysuria, fever, frequency, nausea, urgency or vomiting. The vaginal discharge was thin. The vaginal bleeding is spotting. She has not been passing clots. She has not been passing tissue. Treatments tried: Treated for trichomonas with Flagyl 2 g on 11/26. She is sexually active. No, her partner does not have an STD.  Back Pain This is a new problem. The current episode started in the past 7 days. The problem occurs constantly. The problem is unchanged. The quality of the pain is described as aching. The pain is mild. The pain is the same all the time. The symptoms are aggravated by standing. Associated symptoms include abdominal pain. Pertinent negatives include no dysuria, fever, leg pain, paresthesias, pelvic pain, tingling or weakness. She has tried NSAIDs and analgesics for the symptoms. The treatment provided mild relief.  Abdominal Pain This is a new problem. The current episode started in the past 7 days. The onset quality is gradual. The problem occurs intermittently. The problem has been resolved. Pertinent negatives include no diarrhea, dysuria, fever, frequency, nausea or vomiting.    O: BP 129/80 mmHg  Pulse 79  Temp(Src) 98.2 F (36.8 C) (Oral)  Resp 18  LMP 08/07/2015  VS reviewed, nursing note reviewed,   Constitutional: well developed, well nourished, no distress HEENT: normocephalic CV: normal rate Pulm/chest wall: normal effort Abdomen: soft Neuro: alert and oriented x 3 Skin: warm, dry Psych: affect normal  Results for orders placed or performed during the hospital encounter of 09/07/15 (from the past 24 hour(s))  hCG, quantitative, pregnancy     Status: Abnormal   Collection Time: 09/07/15 10:15 AM  Result Value Ref Range   hCG, Beta Chain, Quant, S 1921 (H) <5 mIU/mL       MDM: Ordered labs/reviewed results.  Quant hcg rose appropriately.  Discussed results with pt.  Pt to follow up with ultrasound in 1 week.  Pt does not desire to follow up with ultrasound result in [facility] for personal reasons.  Plan for results to be reviewed with pt in MAU after ultrasound.  Pt was treated for trichomonas in MAU on 11/26 and was given expedited partner therapy but her partner has not yet picked up Rx.  She denies intercourse since treatment and is waiting for partner to be treated. Ectopic precautions given and pt to return to MAU sooner if s/sx of ectopic, as ruptured ectopic can be life threatening.  Pt stable at time of discharge.  A:  1. Back pain affecting pregnancy in first trimester   2. Spotting affecting pregnancy in first trimester     P: D/C home with ectopic/bleeding precautions F/U with outpatient ultrasound as ordered Return to MAU as needed for emergencies  LEFTWICH-KIRBY, Dwane Andres, CNM 2:18 PM

## 2015-09-07 NOTE — MAU Note (Signed)
Pt here for F/U BHCG, pt continues to have the same back pain as before, has pink discharge, no active bleeding.

## 2015-10-06 ENCOUNTER — Encounter (HOSPITAL_COMMUNITY): Payer: Self-pay

## 2015-10-06 ENCOUNTER — Inpatient Hospital Stay (HOSPITAL_COMMUNITY)
Admission: AD | Admit: 2015-10-06 | Discharge: 2015-10-07 | Disposition: A | Discharge status: Home / Self Care | Payer: Medicaid Other | Source: Ambulatory Visit | Attending: Obstetrics & Gynecology | Admitting: Obstetrics & Gynecology

## 2015-10-06 ENCOUNTER — Inpatient Hospital Stay (HOSPITAL_COMMUNITY): Payer: Medicaid Other

## 2015-10-06 DIAGNOSIS — I10 Essential (primary) hypertension: Secondary | ICD-10-CM | POA: Diagnosis not present

## 2015-10-06 DIAGNOSIS — A499 Bacterial infection, unspecified: Secondary | ICD-10-CM | POA: Diagnosis not present

## 2015-10-06 DIAGNOSIS — O418X10 Other specified disorders of amniotic fluid and membranes, first trimester, not applicable or unspecified: Secondary | ICD-10-CM

## 2015-10-06 DIAGNOSIS — O26899 Other specified pregnancy related conditions, unspecified trimester: Secondary | ICD-10-CM

## 2015-10-06 DIAGNOSIS — O219 Vomiting of pregnancy, unspecified: Secondary | ICD-10-CM

## 2015-10-06 DIAGNOSIS — N760 Acute vaginitis: Secondary | ICD-10-CM | POA: Insufficient documentation

## 2015-10-06 DIAGNOSIS — Z3A08 8 weeks gestation of pregnancy: Secondary | ICD-10-CM | POA: Diagnosis not present

## 2015-10-06 DIAGNOSIS — B9689 Other specified bacterial agents as the cause of diseases classified elsewhere: Secondary | ICD-10-CM

## 2015-10-06 DIAGNOSIS — O23591 Infection of other part of genital tract in pregnancy, first trimester: Secondary | ICD-10-CM | POA: Diagnosis not present

## 2015-10-06 DIAGNOSIS — R109 Unspecified abdominal pain: Secondary | ICD-10-CM

## 2015-10-06 DIAGNOSIS — O9989 Other specified diseases and conditions complicating pregnancy, childbirth and the puerperium: Secondary | ICD-10-CM | POA: Diagnosis not present

## 2015-10-06 DIAGNOSIS — O468X1 Other antepartum hemorrhage, first trimester: Secondary | ICD-10-CM

## 2015-10-06 LAB — CBC
HCT: 34 % — ABNORMAL LOW (ref 36.0–46.0)
HEMOGLOBIN: 11.1 g/dL — AB (ref 12.0–15.0)
MCH: 26.5 pg (ref 26.0–34.0)
MCHC: 32.6 g/dL (ref 30.0–36.0)
MCV: 81.1 fL (ref 78.0–100.0)
PLATELETS: 225 10*3/uL (ref 150–400)
RBC: 4.19 MIL/uL (ref 3.87–5.11)
RDW: 13.4 % (ref 11.5–15.5)
WBC: 6.6 10*3/uL (ref 4.0–10.5)

## 2015-10-06 LAB — COMPREHENSIVE METABOLIC PANEL
ALT: 13 U/L — ABNORMAL LOW (ref 14–54)
ANION GAP: 8 (ref 5–15)
AST: 23 U/L (ref 15–41)
Albumin: 3.6 g/dL (ref 3.5–5.0)
Alkaline Phosphatase: 47 U/L (ref 38–126)
BUN: 9 mg/dL (ref 6–20)
CO2: 21 mmol/L — AB (ref 22–32)
Calcium: 8.9 mg/dL (ref 8.9–10.3)
Chloride: 106 mmol/L (ref 101–111)
Creatinine, Ser: 0.65 mg/dL (ref 0.44–1.00)
GFR calc non Af Amer: >60 mL/min (ref >60)
Glucose, Bld: 100 mg/dL — ABNORMAL HIGH (ref 65–99)
Potassium: 4.3 mmol/L (ref 3.5–5.1)
SODIUM: 135 mmol/L (ref 135–145)
Total Bilirubin: 0.3 mg/dL (ref 0.3–1.2)
Total Protein: 6.7 g/dL (ref 6.5–8.1)

## 2015-10-06 LAB — URINALYSIS, ROUTINE W REFLEX MICROSCOPIC
Bilirubin Urine: NEGATIVE
Glucose, UA: NEGATIVE mg/dL
Ketones, ur: NEGATIVE mg/dL
LEUKOCYTES UA: NEGATIVE
NITRITE: NEGATIVE
PROTEIN: NEGATIVE mg/dL
Specific Gravity, Urine: >1.03 — ABNORMAL HIGH (ref 1.005–1.030)
pH: 6 (ref 5.0–8.0)

## 2015-10-06 LAB — URINE MICROSCOPIC-ADD ON

## 2015-10-06 MED ORDER — LACTATED RINGERS IV BOLUS (SEPSIS)
1000.0000 mL | Freq: Once | INTRAVENOUS | Status: AC
  Administered 2015-10-06: 1000 mL via INTRAVENOUS

## 2015-10-06 MED ORDER — METOCLOPRAMIDE HCL 5 MG/ML IJ SOLN
10.0000 mg | Freq: Once | INTRAMUSCULAR | Status: AC
  Administered 2015-10-06: 10 mg via INTRAVENOUS
  Filled 2015-10-06: qty 2

## 2015-10-06 NOTE — MAU Note (Signed)
Pt reports she has been unable to keep anything down for the last week and is starting to feel weak and "see spots", also reports pressure in her vaginal area for the last 2 days.

## 2015-10-06 NOTE — MAU Provider Note (Signed)
History     CSN: [id number]  Arrival date and time: 10/06/15 2135    First Provider Initiated Contact with Patient 10/06/15 2247         Chief Complaint  Patient presents with  . Emesis During Pregnancy   HPI Christine Cervantes is a 29 y.o. [id number] at [redacted]w[redacted]d who presents with n/v & abdominal cramping.  States vomits everyday 2-3 times per day. No nausea meds at home.  Some lower abdominal cramping.  Denies vaginal bleeding. Does report some vaginal pressure. Some vaginal discharge that looks cloudy watery. Denies odor or irritation.  Treated for trich last month. Both her & partner were treated & she states they waited 3 weeks before having intercourse again.  Was supposed to return last month for outpatient ultrasound to confirm viability but states she never received a call from ultrasound to schedule.      OB History    Gravida Para Term Preterm AB TAB SAB Ectopic Multiple Living   Past Medical History  Diagnosis Date  . Chlamydia   . Trichimoniasis   . Gonorrhea   . Preterm labor   . Urinary tract infection   . Abnormal Pap smear     cryo on cervix  . Depression   . Headache(784.0)   . Anemia   . Pregnancy induced hypertension     Chronic hypertension  . Vaginal Pap smear, abnormal     Past Surgical History  Procedure Laterality Date  . Foot surgery  2004-2004    corns removed from both feet, hammertoes repaired  . Gynecologic cryosurgery      Family History  Problem Relation Age of Onset  . Hypertension Mother   . Hypertension Maternal Aunt   . Hypertension Maternal Grandmother   . Anesthesia problems Neg Hx   . Other Neg Hx   . Diabetes Paternal Grandmother     Social History  Substance Use Topics  . Smoking status: Never Smoker   . Smokeless tobacco: Never Used  . Alcohol Use: 0.0 oz/week     Comment: occ before preg    Allergies:  Allergies  Allergen Reactions  . Latex Swelling and Rash    Prescriptions prior to  admission  Medication Sig Dispense Refill Last Dose  . acetaminophen (TYLENOL) 500 MG tablet Take 500 mg by mouth every 6 (six) hours as needed for moderate pain.    09/04/2015 at Unknown time    Review of Systems  Constitutional: Negative.   Gastrointestinal: Positive for nausea, vomiting and abdominal pain. Negative for heartburn, diarrhea and constipation.  Genitourinary: Negative for dysuria.       + vaginal discharge No vaginal bleeding   Physical Exam   Blood pressure 122/80, pulse 90, temperature 98.8 F (37.1 C), temperature source Oral, resp. rate 18, height  (1.626 m), weight 206 lb (93.441 kg), last menstrual period 08/07/2015, SpO2 100 %, unknown if currently breastfeeding.  Physical Exam  Nursing note and vitals reviewed. Constitutional: She is oriented to person, place, and time. She appears well-developed and well-nourished. No distress.  HENT:  Head: Normocephalic and atraumatic.  Eyes: Conjunctivae are normal. Right eye exhibits no discharge. Left eye exhibits no discharge. No scleral icterus.  Neck: Normal range of motion.  Cardiovascular: Normal rate, regular rhythm and normal heart sounds.   No murmur heard. Respiratory: Effort normal and breath sounds normal. No respiratory distress. She has no wheezes.  GI: Soft. Bowel sounds are normal. She exhibits no distension. There is no tenderness.  Neurological: She is alert and oriented to person, place, and time.  Skin: Skin is warm and dry. She is not diaphoretic.  Psychiatric: She has a normal mood and affect. Her behavior is normal. Judgment and thought content normal.    MAU Course  Procedures Results for orders placed or performed during the hospital encounter of 10/06/15 (from the past 24 hour(s))  Urinalysis, Routine w reflex microscopic (not at [facility])     Status: Abnormal   Collection Time: 10/06/15  9:45 PM  Result Value Ref Range   Color, Urine YELLOW YELLOW   APPearance CLEAR CLEAR   Specific  Gravity, Urine >1.030 (H) 1.005 - 1.030   pH 6.0 5.0 - 8.0   Glucose, UA NEGATIVE NEGATIVE mg/dL   Hgb urine dipstick TRACE (A) NEGATIVE   Bilirubin Urine NEGATIVE NEGATIVE   Ketones, ur NEGATIVE NEGATIVE mg/dL   Protein, ur NEGATIVE NEGATIVE mg/dL   Nitrite NEGATIVE NEGATIVE   Leukocytes, UA NEGATIVE NEGATIVE  Urine microscopic-add on     Status: Abnormal   Collection Time: 10/06/15  9:45 PM  Result Value Ref Range   Squamous Epithelial / LPF 0-5 (A) NONE SEEN   WBC, UA 0-5 0 - 5 WBC/hpf   RBC / HPF 0-5 0 - 5 RBC/hpf   Bacteria, UA RARE (A) NONE SEEN   Urine-Other MUCOUS PRESENT   CBC     Status: Abnormal   Collection Time: 10/06/15 10:55 PM  Result Value Ref Range   WBC 6.6 4.0 - 10.5 K/uL   RBC 4.19 3.87 - 5.11 MIL/uL   Hemoglobin 11.1 (L) 12.0 - 15.0 g/dL   HCT 19.1 (L) 47.8 - 29.5 %   MCV 81.1 78.0 - 100.0 fL   MCH 26.5 26.0 - 34.0 pg   MCHC 32.6 30.0 - 36.0 g/dL   RDW 62.1 30.8 - 65.7 %   Platelets 225 150 - 400 K/uL  Comprehensive metabolic panel     Status: Abnormal   Collection Time: 10/06/15 10:55 PM  Result Value Ref Range   Sodium 135 135 - 145 mmol/L   Potassium 4.3 3.5 - 5.1 mmol/L   Chloride 106 101 - 111 mmol/L   CO2 21 (L) 22 - 32 mmol/L   Glucose, Bld 100 (H) 65 - 99 mg/dL   BUN 9 6 - 20 mg/dL   Creatinine, Ser 8.46 0.44 - 1.00 mg/dL   Calcium 8.9 8.9 - 96.2 mg/dL   Total Protein 6.7 6.5 - 8.1 g/dL   Albumin 3.6 3.5 - 5.0 g/dL   AST 23 15 - 41 U/L   ALT 13 (L) 14 - 54 U/L   Alkaline Phosphatase 47 38 - 126 U/L   Total Bilirubin 0.3 0.3 - 1.2 mg/dL   GFR calc non Af Amer >60 >60 mL/min   GFR calc Af Amer >60 >60 mL/min   Anion gap 8 5 - 15  Wet prep, genital     Status: Abnormal   Collection Time: 10/06/15 11:55 PM  Result Value Ref Range   Yeast Wet Prep HPF POC NONE SEEN NONE SEEN   Trich, Wet Prep NONE SEEN NONE SEEN   Clue Cells Wet Prep HPF POC PRESENT (A) NONE SEEN   WBC, Wet Prep HPF POC NONE SEEN NONE SEEN   Sperm NONE SEEN    US Ob  Transvaginal  10/06/2015  CLINICAL DATA:  Acute onset of vaginal pressure.  Initial encounter. EXAM: TRANSVAGINAL OB ULTRASOUND TECHNIQUE: Transvaginal ultrasound was performed for complete evaluation of the gestation as well as the maternal uterus, adnexal regions, and pelvic cul-de-sac. COMPARISON:  None. FINDINGS: Intrauterine gestational sac:  Visualized/normal in shape. Yolk sac:  Yes Embryo:  Yes Cardiac Activity: Yes Heart Rate: 186 bpm CRL:   2.02 cm   8 w 4 d                  US EDC: 05/13/2016 Subchorionic hemorrhage:  Trace subarachnoid hemorrhage is noted. Maternal uterus/adnexae: There appears to be a small cyst within the placenta, measuring 0.6 cm. The uterus is otherwise unremarkable. The right ovary is unremarkable in appearance. The left ovary is not visualized on this study. The right ovary measures 3.3 x 1.8 x 2.4 cm. No suspicious adnexal masses are seen. No free fluid is seen within the pelvic cul-de-sac. IMPRESSION: 1. Single live intrauterine pregnancy noted, with a crown-rump length of 2.0 cm, corresponding to a gestational age of [redacted] weeks 4 days. This matches the gestational age by LMP, and reflects an estimated date of delivery of May 13, 2016. 2. Trace subarachnoid hemorrhage noted. 3. Apparent small cyst within the placenta, measuring 0.6 cm. Electronically Signed   By: Roanna RaiderJeffery  Chang M.D.   On: 10/06/2015 23:57    MDM IV LR bolus & IV reglan given IUP per ultrasound [facility]  Assessment and Plan  A: 1. BV (bacterial vaginosis)   2. Abdominal pain in pregnancy   3. Vomiting during pregnancy   4. Subchorionic hematoma in first trimester     P: Discharge home Rx flagyl & phenergan Pregnancy verification letter & provider list given Discussed reasons to return GC/CT pending  Judeth HornErin Ragena Fiola, NP  10/06/2015, 10:02 PM

## 2015-10-07 DIAGNOSIS — O9989 Other specified diseases and conditions complicating pregnancy, childbirth and the puerperium: Secondary | ICD-10-CM

## 2015-10-07 DIAGNOSIS — O219 Vomiting of pregnancy, unspecified: Secondary | ICD-10-CM

## 2015-10-07 DIAGNOSIS — N760 Acute vaginitis: Secondary | ICD-10-CM | POA: Diagnosis not present

## 2015-10-07 DIAGNOSIS — R109 Unspecified abdominal pain: Secondary | ICD-10-CM

## 2015-10-07 DIAGNOSIS — A499 Bacterial infection, unspecified: Secondary | ICD-10-CM

## 2015-10-07 DIAGNOSIS — O23591 Infection of other part of genital tract in pregnancy, first trimester: Secondary | ICD-10-CM | POA: Diagnosis not present

## 2015-10-07 DIAGNOSIS — O43891 Other placental disorders, first trimester: Secondary | ICD-10-CM

## 2015-10-07 LAB — WET PREP, GENITAL
Sperm: NONE SEEN
TRICH WET PREP: NONE SEEN
WBC, Wet Prep HPF POC: NONE SEEN
YEAST WET PREP: NONE SEEN

## 2015-10-07 MED ORDER — METRONIDAZOLE 500 MG PO TABS: 500.0000 mg | ORAL_TABLET | Freq: Two times a day (BID) | ORAL | Status: DC

## 2015-10-07 MED ORDER — PROMETHAZINE HCL 25 MG PO TABS: 25.0000 mg | ORAL_TABLET | Freq: Four times a day (QID) | ORAL | Status: DC | PRN

## 2015-10-07 NOTE — Discharge Instructions (Signed)
Bacterial Vaginosis °Bacterial vaginosis is an infection of the vagina. It happens when too many germs (bacteria) grow in the vagina. Having this infection puts you at risk for getting other infections from sex. Treating this infection can help lower your risk for other infections, such as:  °· Chlamydia. °· Gonorrhea. °· HIV. °· Herpes. °HOME CARE °· Take your medicine as told by your doctor. °· Finish your medicine even if you start to feel better. °· Tell your sex partner that you have an infection. They should see their doctor for treatment. °· During treatment: °· Avoid sex or use condoms correctly. °· Do not douche. °· Do not drink alcohol unless your doctor tells you it is ok. °· Do not breastfeed unless your doctor tells you it is ok. °GET HELP IF: °· You are not getting better after 3 days of treatment. °· You have more grey fluid (discharge) coming from your vagina than before. °· You have more pain than before. °· You have a fever. °MAKE SURE YOU:  °· Understand these instructions. °· Will watch your condition. °· Will get help right away if you are not doing well or get worse. °  °This information is not intended to replace advice given to you by your health care provider. Make sure you discuss any questions you have with your health care provider. °  °Document Released: 07/04/2008 Document Revised: 10/16/2014 Document Reviewed: 05/07/2013 °Elsevier Interactive Patient Education ©2016 Else[employer] Inc[employer] °Subchorionic Hematoma °A subchorionic hematoma is a gathering of blood between the outer wall of the placenta and the inner wall of the womb (uterus). The placenta is the organ that connects the fetus to the wall of the uterus. The placenta performs the feeding, breathing (oxygen to the fetus), and waste removal (excretory work) of the fetus.  °Subchorionic hematoma is the most common abnormality found on a result from ultrasonography done during the first trimester or early second trimester of pregnancy. If  there has been little or no vaginal bleeding, early small hematomas usually shrink on their own and do not affect your baby or pregnancy. The blood is gradually absorbed over 1-2 weeks. When bleeding starts later in pregnancy or the hematoma is larger or occurs in an older pregnant woman, the outcome may not be as good. Larger hematomas may get bigger, which increases the chances for miscarriage. Subchorionic hematoma also increases the risk of premature detachment of the placenta from the uterus, preterm (premature) labor, and stillbirth. °HOME CARE INSTRUCTIONS °· Stay on bed rest if your health care provider recommends this. Although bed rest will not prevent more bleeding or prevent a miscarriage, your health care provider may recommend bed rest until you are advised otherwise. °· Avoid heavy lifting (more than 10 lb [4.5 kg]), exercise, sexual intercourse, or douching as directed by your health care provider. °· Keep track of the number of pads you use each day and how soaked (saturated) they are. Write down this information. °· Do not use tampons. °· Keep all follow-up appointments as directed by your health care provider. Your health care provider may ask you to have follow-up blood tests or ultrasound tests or both. °SEEK IMMEDIATE MEDICAL CARE IF: °· You have severe cramps in your stomach, back, abdomen, or pelvis. °· You have a fever. °· You pass large clots or tissue. Save any tissue for your health care provider to look at. °· Your bleeding increases or you become lightheaded, feel weak, or have fainting episodes. °  °This information is   not intended to replace advice given to you by your health care provider. Make sure you discuss any questions you have with your health care provider.   Document Released: 01/10/2007 Document Revised: 10/16/2014 Document Reviewed: 04/24/2013 Elsevier Interactive Patient Education [employer]

## 2015-10-09 LAB — GC/CHLAMYDIA PROBE AMP (~~LOC~~) NOT AT ARMC
CHLAMYDIA, DNA PROBE: NEGATIVE
Neisseria Gonorrhea: NEGATIVE

## 2015-10-10 NOTE — L&D Delivery Note (Signed)
Delivery Note At 9:12 AM a viable female was delivered via Vaginal, Spontaneous Delivery (Presentation: Left Occiput Anterior).  APGAR: 9,9 ; weight  .   Placenta status: Intact, Spontaneous.  Cord: 3 vessels with the following complications: None.  Cord pH: n/a  Anesthesia: None  Episiotomy: None Lacerations: None Suture Repair: n/a Est. Blood Loss (mL): 100  Mom to postpartum.  Baby to Couplet care / Skin to Skin.  Christine Cervantes 04/16/2016, 9:33 AM

## 2015-11-30 ENCOUNTER — Other Ambulatory Visit: Payer: Medicaid Other

## 2015-12-01 ENCOUNTER — Other Ambulatory Visit (INDEPENDENT_AMBULATORY_CARE_PROVIDER_SITE_OTHER): Payer: Medicaid Other

## 2015-12-01 DIAGNOSIS — Z3481 Encounter for supervision of other normal pregnancy, first trimester: Secondary | ICD-10-CM

## 2015-12-02 LAB — OBSTETRIC PANEL
Antibody Screen: NEGATIVE
Basophils Absolute: 0 10*3/uL (ref 0.0–0.1)
Basophils Relative: 0 % (ref 0–1)
Eosinophils Absolute: 0.1 10*3/uL (ref 0.0–0.7)
Eosinophils Relative: 1 % (ref 0–5)
HCT: 32.3 % — ABNORMAL LOW (ref 36.0–46.0)
HEP B S AG: NEGATIVE
Hemoglobin: 10.8 g/dL — ABNORMAL LOW (ref 12.0–15.0)
LYMPHS PCT: 23 % (ref 12–46)
Lymphs Abs: 1.4 10*3/uL (ref 0.7–4.0)
MCH: 27.1 pg (ref 26.0–34.0)
MCHC: 33.4 g/dL (ref 30.0–36.0)
MCV: 81 fL (ref 78.0–100.0)
MONO ABS: 0.4 10*3/uL (ref 0.1–1.0)
MONOS PCT: 7 % (ref 3–12)
MPV: 9.8 fL (ref 8.6–12.4)
NEUTROS ABS: 4.3 10*3/uL (ref 1.7–7.7)
Neutrophils Relative %: 69 % (ref 43–77)
Platelets: 210 10*3/uL (ref 150–400)
RBC: 3.99 MIL/uL (ref 3.87–5.11)
RDW: 13.5 % (ref 11.5–15.5)
RH TYPE: POSITIVE
Rubella: 5.96 Index — ABNORMAL HIGH (ref <0.90)
WBC: 6.3 10*3/uL (ref 4.0–10.5)

## 2015-12-02 LAB — HIV ANTIBODY (ROUTINE TESTING W REFLEX): HIV 1&2 Ab, 4th Generation: NONREACTIVE

## 2015-12-03 LAB — CULTURE, OB URINE
Colony Count: NO GROWTH
Organism ID, Bacteria: NO GROWTH

## 2015-12-07 ENCOUNTER — Encounter: Payer: Medicaid Other | Admitting: Internal Medicine

## 2015-12-28 ENCOUNTER — Ambulatory Visit (INDEPENDENT_AMBULATORY_CARE_PROVIDER_SITE_OTHER): Payer: Medicaid Other | Admitting: Family Medicine

## 2015-12-28 ENCOUNTER — Encounter: Payer: Self-pay | Admitting: Family Medicine

## 2015-12-28 VITALS — BP 132/70 | HR 82 | Temp 98.5°F | Wt 203.0 lb

## 2015-12-28 DIAGNOSIS — O09892 Supervision of other high risk pregnancies, second trimester: Secondary | ICD-10-CM | POA: Insufficient documentation

## 2015-12-28 DIAGNOSIS — O09212 Supervision of pregnancy with history of pre-term labor, second trimester: Secondary | ICD-10-CM

## 2015-12-28 DIAGNOSIS — I10 Essential (primary) hypertension: Secondary | ICD-10-CM

## 2015-12-28 DIAGNOSIS — D509 Iron deficiency anemia, unspecified: Secondary | ICD-10-CM

## 2015-12-28 DIAGNOSIS — Z3482 Encounter for supervision of other normal pregnancy, second trimester: Secondary | ICD-10-CM

## 2015-12-28 LAB — COMPLETE METABOLIC PANEL WITH GFR
ALT: 5 U/L — AB (ref 6–29)
AST: 7 U/L — ABNORMAL LOW (ref 10–30)
Albumin: 3.3 g/dL — ABNORMAL LOW (ref 3.6–5.1)
Alkaline Phosphatase: 48 U/L (ref 33–115)
BUN: 4 mg/dL — AB (ref 7–25)
CHLORIDE: 106 mmol/L (ref 98–110)
CO2: 22 mmol/L (ref 20–31)
CREATININE: 0.58 mg/dL (ref 0.50–1.10)
Calcium: 8.3 mg/dL — ABNORMAL LOW (ref 8.6–10.2)
GLUCOSE: 75 mg/dL (ref 65–99)
POTASSIUM: 3.8 mmol/L (ref 3.5–5.3)
Sodium: 137 mmol/L (ref 135–146)
Total Bilirubin: 0.2 mg/dL (ref 0.2–1.2)
Total Protein: 6 g/dL — ABNORMAL LOW (ref 6.1–8.1)

## 2015-12-28 LAB — CBC
HEMATOCRIT: 31.4 % — AB (ref 36.0–46.0)
HEMOGLOBIN: 10.3 g/dL — AB (ref 12.0–15.0)
MCH: 27 pg (ref 26.0–34.0)
MCHC: 32.8 g/dL (ref 30.0–36.0)
MCV: 82.2 fL (ref 78.0–100.0)
MPV: 10 fL (ref 8.6–12.4)
Platelets: 234 10*3/uL (ref 150–400)
RBC: 3.82 MIL/uL — ABNORMAL LOW (ref 3.87–5.11)
RDW: 13.4 % (ref 11.5–15.5)
WBC: 7.2 10*3/uL (ref 4.0–10.5)

## 2015-12-28 MED ORDER — PRENATAL/IRON PO TABS: 1.0000 | ORAL_TABLET | Freq: Every day | ORAL | Status: DC

## 2015-12-28 MED ORDER — ASPIRIN EC 81 MG PO TBEC: 81.0000 mg | DELAYED_RELEASE_TABLET | Freq: Every day | ORAL | Status: DC

## 2015-12-28 NOTE — Progress Notes (Signed)
Bettey Costamanda M Kilgallon is a 30 y.o. yo [id number] at 6835w3d who presents for her initial prenatal visit. Pregnancy  is notplanned She reports fatigue and positive home pregnancy test. She  is notTaking PNV. See flow sheet for details.  PMH, POBH, FH, meds, allergies and Social Hx reviewed. History of preterm delivery with first child and cervical dilation starting at 5 months with every pregnancy. Chronic vs gHTN, BP high normal today.   Prenatal exam: Gen: Well nourished, well developed.  No distress.  Vitals noted. HEENT: Normocephalic, atraumatic.  Neck supple without cervical lymphadenopathy, thyromegaly or thyroid nodules.  fair dentition. CV: RRR no murmur, gallops or rubs Lungs: CTA B.  Normal respiratory effort without wheezes or rales. Abd: soft, NTND. +BS.  Uterus not appreciated above pelvis. Ext: No clubbing, cyanosis. Trace LE edema. Psych: Normal grooming and dress.  Not depressed or anxious appearing.  Normal thought content and process without flight of ideas or looseness of associations    Assessment/plan: 1) Pregnancy 3035w3d doing well.  Current pregnancy issues include h/o preterm birth and HTN Dating is reliable Prenatal labs reviewed, notable for all normal. Bleeding and pain precautions reviewed. Importance of prenatal vitamins reviewed.  Genetic screening offered. Quad drawn today Early glucola is indicated. Plan to do at next visit. Start PNV now that nausea has subsided and ASA 81mg  for pre-e ppx. Will get baseline CMP, CBC and UPC given pre-e risk. Discussed with OB fellow, Dr. Alvester MorinNewton. Refer to high risk OB as will likely need 17-P again as well as close monitoring.  Scheduled anatomy scan this Thursday since she is already 20w. Follow up 4 weeks with us if not able to get scheduled in [facility] clinic before that.

## 2015-12-28 NOTE — Patient Instructions (Signed)

## 2015-12-29 ENCOUNTER — Encounter: Payer: Self-pay | Admitting: Family

## 2015-12-29 LAB — PROTEIN / CREATININE RATIO, URINE
CREATININE, URINE: 170 mg/dL (ref 20–320)
Protein Creatinine Ratio: 76 mg/g creat (ref 21–161)
TOTAL PROTEIN, URINE: 13 mg/dL (ref 5–24)

## 2015-12-30 ENCOUNTER — Ambulatory Visit (HOSPITAL_COMMUNITY)
Admission: RE | Admit: 2015-12-30 | Discharge: 2015-12-30 | Disposition: A | Discharge status: Home / Self Care | Payer: Medicaid Other | Source: Ambulatory Visit | Attending: Family Medicine | Admitting: Family Medicine

## 2015-12-30 DIAGNOSIS — O10012 Pre-existing essential hypertension complicating pregnancy, second trimester: Secondary | ICD-10-CM | POA: Diagnosis not present

## 2015-12-30 DIAGNOSIS — O0932 Supervision of pregnancy with insufficient antenatal care, second trimester: Secondary | ICD-10-CM | POA: Diagnosis not present

## 2015-12-30 DIAGNOSIS — Z3482 Encounter for supervision of other normal pregnancy, second trimester: Secondary | ICD-10-CM

## 2015-12-30 DIAGNOSIS — Z3A20 20 weeks gestation of pregnancy: Secondary | ICD-10-CM | POA: Diagnosis not present

## 2015-12-30 DIAGNOSIS — O3442 Maternal care for other abnormalities of cervix, second trimester: Secondary | ICD-10-CM | POA: Diagnosis not present

## 2015-12-30 DIAGNOSIS — O09212 Supervision of pregnancy with history of pre-term labor, second trimester: Secondary | ICD-10-CM | POA: Insufficient documentation

## 2016-01-03 LAB — AFP, QUAD SCREEN
AFP MoM: 0.79
DIA MOM: 0.95
HCG MoM: 0.55
uE3 MoM: 1.38

## 2016-01-20 ENCOUNTER — Encounter: Payer: Medicaid Other | Admitting: Family Medicine

## 2016-01-25 ENCOUNTER — Encounter: Payer: Self-pay | Admitting: Family Medicine

## 2016-01-27 ENCOUNTER — Encounter: Payer: Medicaid Other | Admitting: Family

## 2016-01-28 ENCOUNTER — Ambulatory Visit (INDEPENDENT_AMBULATORY_CARE_PROVIDER_SITE_OTHER): Payer: Medicaid Other | Admitting: Family Medicine

## 2016-01-28 VITALS — BP 111/72 | HR 89 | Temp 97.9°F | Wt 202.8 lb

## 2016-01-28 DIAGNOSIS — O09212 Supervision of pregnancy with history of pre-term labor, second trimester: Secondary | ICD-10-CM | POA: Diagnosis not present

## 2016-01-28 DIAGNOSIS — O09892 Supervision of other high risk pregnancies, second trimester: Secondary | ICD-10-CM

## 2016-01-28 DIAGNOSIS — O2342 Unspecified infection of urinary tract in pregnancy, second trimester: Secondary | ICD-10-CM | POA: Diagnosis not present

## 2016-01-28 DIAGNOSIS — I10 Essential (primary) hypertension: Secondary | ICD-10-CM | POA: Diagnosis not present

## 2016-01-28 DIAGNOSIS — Z3482 Encounter for supervision of other normal pregnancy, second trimester: Secondary | ICD-10-CM

## 2016-01-28 DIAGNOSIS — R350 Frequency of micturition: Secondary | ICD-10-CM | POA: Diagnosis not present

## 2016-01-28 DIAGNOSIS — A599 Trichomoniasis, unspecified: Secondary | ICD-10-CM | POA: Diagnosis not present

## 2016-01-28 LAB — POCT URINALYSIS DIPSTICK
Blood, UA: NEGATIVE
Glucose, UA: NEGATIVE
Nitrite, UA: NEGATIVE
PROTEIN UA: 30
SPEC GRAV UA: 1.02
Urobilinogen, UA: 1
pH, UA: 6

## 2016-01-28 LAB — POCT UA - MICROSCOPIC ONLY: Trichomonas, UA: POSITIVE

## 2016-01-28 MED ORDER — METRONIDAZOLE 250 MG PO TABS
1000.0000 mg | ORAL_TABLET | Freq: Once | ORAL | Status: AC
  Administered 2016-01-28: 1000 mg via ORAL

## 2016-01-28 MED ORDER — METRONIDAZOLE 500 MG PO TABS: 2000.0000 mg | ORAL_TABLET | Freq: Once | ORAL | Status: DC

## 2016-01-28 MED ORDER — CEPHALEXIN 500 MG PO CAPS: 500.0000 mg | ORAL_CAPSULE | Freq: Two times a day (BID) | ORAL | Status: DC

## 2016-01-28 NOTE — Progress Notes (Signed)
Dr. Richarda BladeAdamo requested a Behavioral Health Consult.   Presenting Issue: [name] presents with depressed mood and anhedonia due to deterioration of her grandmother's medical condition.  Report of symptoms: Patient reports depressed mood, loss of motivation/pleasure, lack of energy, hypersomnia, and subjective loss of appetite.  Duration of CURRENT symptoms: Less than two weeks; Patient has previous history of depression.  Age of onset of first mood disturbance: Not assessed  Impact on function: Patient reports that she is able to perform basic self-care tasks and take care of her children. However, she has not been going to work due to her symptoms.  Psychiatric History - Diagnoses: Depression (per chart) - Hospitalizations:Not assessed - Pharmacotherapy: None currently - Outpatient therapy: None currently  Family history of psychiatric issues: Not assessed.  Current and history of substance use:  Not assessed.  Medical conditions that might explain or contribute to symptoms: Patient reported that she is also under significant stress due to her current pregnancy.  Assessment / Plan / Recommendations:Patient reported that her grandmother has been sick for some time, but recently developed a serious infection and is expected to die in the next several days. Patient's grandmother served as a parent figure to her and helped raise her during childhood. Patient reports that she is overwhelmed with stress due to this development, and is finding it difficult to cope with this anticipated loss. [facility], and emphasized that grief is a painful, but normal process. Currently, patient appears to be having a typical reaction to difficult life circumstances; however, due to patient's history of depression and continued stress added by pregnancy, patient should continue to be monitored for complicated bereavement and development of depression going forward. [facility] encouraged patient to  refrain from completely withdrawing from all activities, and trying to accomplish one or two small activities (e.g., going for a short walk) each week. [facility].

## 2016-01-28 NOTE — Patient Instructions (Signed)
Braxton Hicks Contractions °Contractions of the uterus can occur throughout pregnancy. Contractions are not always a sign that you are in labor.  °WHAT ARE BRAXTON HICKS CONTRACTIONS?  °Contractions that occur before labor are called Braxton Hicks contractions, or false labor. Toward the end of pregnancy (32-34 weeks), these contractions can develop more often and may become more forceful. This is not true labor because these contractions do not result in opening (dilatation) and thinning of the cervix. They are sometimes difficult to tell apart from true labor because these contractions can be forceful and people have different pain tolerances. You should not feel embarrassed if you go to the hospital with false labor. Sometimes, the only way to tell if you are in true labor is for your health care provider to look for changes in the cervix. °If there are no prenatal problems or other health problems associated with the pregnancy, it is completely safe to be sent home with false labor and await the onset of true labor. °HOW CAN YOU TELL THE DIFFERENCE BETWEEN TRUE AND FALSE LABOR? °False Labor °· The contractions of false labor are usually shorter and not as hard as those of true labor.   °· The contractions are usually irregular.   °· The contractions are often felt in the front of the lower abdomen and in the groin.   °· The contractions may go away when you walk around or change positions while lying down.   °· The contractions get weaker and are shorter lasting as time goes on.   °· The contractions do not usually become progressively stronger, regular, and closer together as with true labor.   °True Labor °· Contractions in true labor last 30-70 seconds, become very regular, usually become more intense, and increase in frequency.   °· The contractions do not go away with walking.   °· The discomfort is usually felt in the top of the uterus and spreads to the lower abdomen and low back.   °· True labor can be  determined by your health care provider with an exam. This will show that the cervix is dilating and getting thinner.   °WHAT TO REMEMBER °· Keep up with your usual exercises and follow other instructions given by your health care provider.   °· Take medicines as directed by your health care provider.   °· Keep your regular prenatal appointments.   °· Eat and drink lightly if you think you are going into labor.   °· If Braxton Hicks contractions are making you uncomfortable:   °¨ Change your position from lying down or resting to walking, or from walking to resting.   °¨ Sit and rest in a tub of warm water.   °¨ Drink 2-3 glasses of water. Dehydration may cause these contractions.   °¨ Do slow and deep breathing several times an hour.   °WHEN SHOULD I SEEK IMMEDIATE MEDICAL CARE? °Seek immediate medical care if: °· Your contractions become stronger, more regular, and closer together.   °· You have fluid leaking or gushing from your vagina.   °· You have a fever.   °· You pass blood-tinged mucus.   °· You have vaginal bleeding.   °· You have continuous abdominal pain.   °· You have low back pain that you never had before.   °· You feel your baby's head pushing down and causing pelvic pressure.   °· Your baby is not moving as much as it used to.   °  °This information is not intended to replace advice given to you by your health care provider. Make sure you discuss any questions you have with your health care   provider. °  °Document Released: 09/25/2005 Document Revised: 09/30/2013 Document Reviewed: 07/07/2013 °Elsevier Interactive Patient Education ©2016 Elsevier Inc. ° °

## 2016-02-02 NOTE — Progress Notes (Signed)
Christine Cervantes is a 30 y.o. [id number] at 5417w4d for routine follow up.  She reports occasional upper abdominal tightness, no regular contractions, no bleeding or leaking fluid. Pt very distressed about her grandmother who is going into hospice.  See flow sheet for details.  A/P: Pregnancy at 3229w6d.  Doing well overall. Does report some upper abdominal tightness, UA concerning for UTI, no back pain or fever. Trich in urine as well. Pregnancy issues include h/o preterm delivery and cervical incompetence, referred to high risk but pt keeps rescheduling Anatomy scan reviewed, problems are not noted. Normal cervical length. Treat with keflex for UTI and flagyl for trich. Seen by psych during visit to offer support for her normal grief reaction. Preterm labor precautions reviewed. Urged her that if abdominal tightness persists despite abx she should present to MAU for eval to preterm labor. Follow with high risk as scheduled, again emphasized the importance of starting 17P and urged [facility] to [facility] to fill out paperwork and start injections

## 2016-02-04 ENCOUNTER — Ambulatory Visit: Payer: Medicaid Other

## 2016-02-10 ENCOUNTER — Ambulatory Visit (INDEPENDENT_AMBULATORY_CARE_PROVIDER_SITE_OTHER): Payer: Medicaid Other | Admitting: Obstetrics & Gynecology

## 2016-02-10 ENCOUNTER — Other Ambulatory Visit (HOSPITAL_COMMUNITY)
Admission: RE | Admit: 2016-02-10 | Discharge: 2016-02-10 | Disposition: A | Discharge status: Home / Self Care | Payer: Medicaid Other | Source: Ambulatory Visit | Attending: Obstetrics & Gynecology | Admitting: Obstetrics & Gynecology

## 2016-02-10 VITALS — BP 106/78 | HR 109 | Wt 204.0 lb

## 2016-02-10 DIAGNOSIS — O09212 Supervision of pregnancy with history of pre-term labor, second trimester: Secondary | ICD-10-CM

## 2016-02-10 DIAGNOSIS — O10912 Unspecified pre-existing hypertension complicating pregnancy, second trimester: Secondary | ICD-10-CM

## 2016-02-10 DIAGNOSIS — O99612 Diseases of the digestive system complicating pregnancy, second trimester: Secondary | ICD-10-CM

## 2016-02-10 DIAGNOSIS — K219 Gastro-esophageal reflux disease without esophagitis: Secondary | ICD-10-CM

## 2016-02-10 DIAGNOSIS — Z23 Encounter for immunization: Secondary | ICD-10-CM

## 2016-02-10 DIAGNOSIS — Z113 Encounter for screening for infections with a predominantly sexual mode of transmission: Secondary | ICD-10-CM

## 2016-02-10 DIAGNOSIS — O09892 Supervision of other high risk pregnancies, second trimester: Secondary | ICD-10-CM

## 2016-02-10 DIAGNOSIS — O10919 Unspecified pre-existing hypertension complicating pregnancy, unspecified trimester: Secondary | ICD-10-CM

## 2016-02-10 LAB — CBC
HEMATOCRIT: 32.8 % — AB (ref 35.0–45.0)
Hemoglobin: 10.7 g/dL — ABNORMAL LOW (ref 11.7–15.5)
MCH: 27.2 pg (ref 27.0–33.0)
MCHC: 32.6 g/dL (ref 32.0–36.0)
MCV: 83.2 fL (ref 80.0–100.0)
MPV: 10 fL (ref 7.5–12.5)
Platelets: 225 10*3/uL (ref 140–400)
RBC: 3.94 MIL/uL (ref 3.80–5.10)
RDW: 13.6 % (ref 11.0–15.0)
WBC: 6.9 10*3/uL (ref 3.8–10.8)

## 2016-02-10 LAB — POCT URINALYSIS DIP (DEVICE)
Glucose, UA: NEGATIVE mg/dL
HGB URINE DIPSTICK: NEGATIVE
Leukocytes, UA: NEGATIVE
NITRITE: NEGATIVE
PH: 5.5 (ref 5.0–8.0)
Protein, ur: 30 mg/dL — AB
Specific Gravity, Urine: >=1.03 (ref 1.005–1.030)
Urobilinogen, UA: 1 mg/dL (ref 0.0–1.0)

## 2016-02-10 MED ORDER — PANTOPRAZOLE SODIUM 40 MG PO TBEC: 40.0000 mg | DELAYED_RELEASE_TABLET | Freq: Every day | ORAL | Status: DC

## 2016-02-10 MED ORDER — TETANUS-DIPHTH-ACELL PERTUSSIS 5-2.5-18.5 LF-MCG/0.5 IM SUSP
0.5000 mL | Freq: Once | INTRAMUSCULAR | Status: AC
  Administered 2016-02-10: 0.5 mL via INTRAMUSCULAR

## 2016-02-10 NOTE — Progress Notes (Signed)
U/S follow up scheduled first available 05/16 @ 330pm.

## 2016-02-10 NOTE — Patient Instructions (Addendum)
Third Trimester of Pregnancy The third trimester is from week 29 through week 42, months 7 through 9. The third trimester is a time when the fetus is growing rapidly. At the end of the ninth month, the fetus is about 20 inches in length and weighs 6-10 pounds.  BODY CHANGES Your body goes through many changes during pregnancy. The changes vary from woman to woman.   Your weight will continue to increase. You can expect to gain 25-35 pounds (11-16 kg) by the end of the pregnancy.  You may begin to get stretch marks on your hips, abdomen, and breasts.  You may urinate more often because the fetus is moving lower into your pelvis and pressing on your bladder.  You may develop or continue to have heartburn as a result of your pregnancy.  You may develop constipation because certain hormones are causing the muscles that push waste through your intestines to slow down.  You may develop hemorrhoids or swollen, bulging veins (varicose veins).  You may have pelvic pain because of the weight gain and pregnancy hormones relaxing your joints between the bones in your pelvis. Backaches may result from overexertion of the muscles supporting your posture.  You may have changes in your hair. These can include thickening of your hair, rapid growth, and changes in texture. Some women also have hair loss during or after pregnancy, or hair that feels dry or thin. Your hair will most likely return to normal after your baby is born.  Your breasts will continue to grow and be tender. A yellow discharge may leak from your breasts called colostrum.  Your belly button may stick out.  You may feel short of breath because of your expanding uterus.  You may notice the fetus "dropping," or moving lower in your abdomen.  You may have a bloody mucus discharge. This usually occurs a few days to a week before labor begins.  Your cervix becomes thin and soft (effaced) near your due date. WHAT TO EXPECT AT YOUR PRENATAL  EXAMS  You will have prenatal exams every 2 weeks until week 36. Then, you will have weekly prenatal exams. During a routine prenatal visit:  You will be weighed to make sure you and the fetus are growing normally.  Your blood pressure is taken.  Your abdomen will be measured to track your baby's growth.  The fetal heartbeat will be listened to.  Any test results from the previous visit will be discussed.  You may have a cervical check near your due date to see if you have effaced. At around 36 weeks, your caregiver will check your cervix. At the same time, your caregiver will also perform a test on the secretions of the vaginal tissue. This test is to determine if a type of bacteria, Group B streptococcus, is present. Your caregiver will explain this further. Your caregiver may ask you:  What your birth plan is.  How you are feeling.  If you are feeling the baby move.  If you have had any abnormal symptoms, such as leaking fluid, bleeding, severe headaches, or abdominal cramping.  If you are using any tobacco products, including cigarettes, chewing tobacco, and electronic cigarettes.  If you have any questions. Other tests or screenings that may be performed during your third trimester include:  Blood tests that check for low iron levels (anemia).  Fetal testing to check the health, activity level, and growth of the fetus. Testing is done if you have certain medical conditions or if   there are problems during the pregnancy.  HIV (human immunodeficiency virus) testing. If you are at high risk, you may be screened for HIV during your third trimester of pregnancy. FALSE LABOR You may feel small, irregular contractions that eventually go away. These are called Braxton Hicks contractions, or false labor. Contractions may last for hours, days, or even weeks before true labor sets in. If contractions come at regular intervals, intensify, or become painful, it is best to be seen by your  caregiver.  SIGNS OF LABOR   Menstrual-like cramps.  Contractions that are 5 minutes apart or less.  Contractions that start on the top of the uterus and spread down to the lower abdomen and back.  A sense of increased pelvic pressure or back pain.  A watery or bloody mucus discharge that comes from the vagina. If you have any of these signs before the 37th week of pregnancy, call your caregiver right away. You need to go to the hospital to get checked immediately. HOME CARE INSTRUCTIONS   Avoid all smoking, herbs, alcohol, and unprescribed drugs. These chemicals affect the formation and growth of the baby.  Do not use any tobacco products, including cigarettes, chewing tobacco, and electronic cigarettes. If you need help quitting, ask your health care provider. You may receive counseling support and other resources to help you quit.  Follow your caregiver's instructions regarding medicine use. There are medicines that are either safe or unsafe to take during pregnancy.  Exercise only as directed by your caregiver. Experiencing uterine cramps is a good sign to stop exercising.  Continue to eat regular, healthy meals.  Wear a good support bra for breast tenderness.  Do not use hot tubs, steam rooms, or saunas.  Wear your seat belt at all times when driving.  Avoid raw meat, uncooked cheese, cat litter boxes, and soil used by cats. These carry germs that can cause birth defects in the baby.  Take your prenatal vitamins.  Take 1500-2000 mg of calcium daily starting at the 20th week of pregnancy until you deliver your baby.  Try taking a stool softener (if your caregiver approves) if you develop constipation. Eat more high-fiber foods, such as fresh vegetables or fruit and whole grains. Drink plenty of fluids to keep your urine clear or pale yellow.  Take warm sitz baths to soothe any pain or discomfort caused by hemorrhoids. Use hemorrhoid cream if your caregiver approves.  If  you develop varicose veins, wear support hose. Elevate your feet for 15 minutes, 3-4 times a day. Limit salt in your diet.  Avoid heavy lifting, wear low heal shoes, and practice good posture.  Rest a lot with your legs elevated if you have leg cramps or low back pain.  Visit your dentist if you have not gone during your pregnancy. Use a soft toothbrush to brush your teeth and be gentle when you floss.  A sexual relationship may be continued unless your caregiver directs you otherwise.  Do not travel far distances unless it is absolutely necessary and only with the approval of your caregiver.  Take prenatal classes to understand, practice, and ask questions about the labor and delivery.  Make a trial run to the hospital.  Pack your hospital bag.  Prepare the baby's nursery.  Continue to go to all your prenatal visits as directed by your caregiver. SEEK MEDICAL CARE IF:  You are unsure if you are in labor or if your water has broken.  You have dizziness.  You have  mild pelvic cramps, pelvic pressure, or nagging pain in your abdominal area.  You have persistent nausea, vomiting, or diarrhea.  You have a bad smelling vaginal discharge.  You have pain with urination. SEEK IMMEDIATE MEDICAL CARE IF:   You have a fever.  You are leaking fluid from your vagina.  You have spotting or bleeding from your vagina.  You have severe abdominal cramping or pain.  You have rapid weight loss or gain.  You have shortness of breath with chest pain.  You notice sudden or extreme swelling of your face, hands, ankles, feet, or legs.  You have not felt your baby move in over an hour.  You have severe headaches that do not go away with medicine.  You have vision changes.   This information is not intended to replace advice given to you by your health care provider. Make sure you discuss any questions you have with your health care provider.   Document Released: 09/19/2001 Document  Revised: 10/16/2014 Document Reviewed: 11/26/2012 Elsevier Interactive Patient Education 2016 Elsevier Inc[employer] Heartburn During Pregnancy Heartburn is a burning sensation in the chest caused by stomach acid backing up into the esophagus. Heartburn is common in pregnancy because a certain hormone (progesterone) is released when a woman is pregnant. The progesterone hormone may relax the valve that separates the esophagus from the stomach. This allows acid to go up into the esophagus, causing heartburn. Heartburn may also happen in pregnancy because the enlarging uterus pushes up on the stomach, which pushes more acid into the esophagus. This is especially true in the later stages of pregnancy. Heartburn problems usually go away after giving birth. CAUSES  Heartburn is caused by stomach acid backing up into the esophagus. During pregnancy, this may result from various things, including:   The progesterone hormone.  Changing hormone levels.  The growing uterus pushing stomach acid upward.  Large meals.  Certain foods and drinks.  Exercise.  Increased acid production. SIGNS AND SYMPTOMS   Burning pain in the chest or lower throat.  Bitter taste in the mouth.  Coughing. DIAGNOSIS  Your health care provider will typically diagnose heartburn by taking a careful history of your concern. Blood tests may be done to check for a certain type of bacteria that is associated with heartburn. Sometimes, heartburn is diagnosed by prescribing a heartburn medicine to see if the symptoms improve. In some cases, a procedure called an endoscopy may be done. In this procedure, a tube with a light and a camera on the end (endoscope) is used to examine the esophagus and the stomach. TREATMENT  Treatment will vary depending on the severity of your symptoms. Your health care provider may recommend:  Over-the-counter medicines (antacids, acid reducers) for mild heartburn.  Prescription medicines to decrease  stomach acid or to protect your stomach lining.  Certain changes in your diet.  Elevating the head of your bed by putting blocks under the legs. This helps prevent stomach acid from backing up into the esophagus when you are lying down. HOME CARE INSTRUCTIONS   Only take over-the-counter or prescription medicines as directed by your health care provider.  Raise the head of your bed by putting blocks under the legs if instructed to do so by your health care provider. Sleeping with more pillows is not effective because it only changes the position of your head.  Do not exercise right after eating.  Avoid eating 2-3 hours before bed. Do not lie down right after eating.  Eat small  small meals throughout the day instead of three large meals.  Identify foods and beverages that make your symptoms worse and avoid them. Foods you may want to avoid include:  Peppers.  Chocolate.  High-fat foods, including fried foods.  Spicy foods.  Garlic and onions.  Citrus fruits, including oranges, grapefruit, lemons, and limes.  Food containing tomatoes or tomato products.  Mint.  Carbonated and caffeinated drinks.  Vinegar. SEEK MEDICAL CARE IF:  You have abdominal pain of any kind.  You feel burning in your upper abdomen or chest, especially after eating or lying down.  You have nausea and vomiting.  Your stomach feels upset after you eat. SEEK IMMEDIATE MEDICAL CARE IF:   You have severe chest pain that goes down your arm or into your jaw or neck.  You feel sweaty, dizzy, or light-headed.  You become short of breath.  You vomit blood.  You have difficulty or pain with swallowing.  You have bloody or black, tarry stools.  You have episodes of heartburn more than 3 times a week, for more than 2 weeks. MAKE SURE YOU:  Understand these instructions.  Will watch your condition.  Will get help right away if you are not doing well or get worse.   This information is not intended  to replace advice given to you by your health care provider. Make sure you discuss any questions you have with your health care provider.   Document Released: 09/22/2000 Document Revised: 10/16/2014 Document Reviewed: 05/14/2013 Elsevier Interactive Patient Education 2016 Elsevier Inc.  

## 2016-02-10 NOTE — Progress Notes (Signed)
Subjective:GERD takes Maalox    DUHA ABAIR is a [id number] [redacted]w[redacted]d being seen today for her first obstetrical visit.  Her obstetrical history is significant for h/o HTN, h/o preeclampsia. Patient does intend to breast feed. Pregnancy history fully reviewed.  Patient reports heartburn.  Filed Vitals:   02/10/16 0822  BP: 106/78  Pulse: 109  Weight: 204 lb (92.534 kg)    HISTORY: OB History  Gravida Para Term Preterm AB SAB TAB Ectopic Multiple Living  # Outcome Date GA Lbr Len/2nd Weight Sex Delivery Anes PTL Lv  6 Current           5 SAB 2016          4 Term 01/02/13 [redacted]w[redacted]d 32:43 6 lb 3.1 oz (2.81 kg) F Vag-Spont EPI  Y  3 SAB 2011          2 Term 07/15/09 [redacted]w[redacted]d   F Vag-Spont EPI N Y     Comments: ? mass on fetal heart- no problems  1 Preterm 11/06/05 [redacted]w[redacted]d   M Vag-Spont EPI Y Y     Comments: elevated BP     Past Medical History  Diagnosis Date  . Chlamydia   . Trichimoniasis   . Gonorrhea   . Preterm labor   . Urinary tract infection   . Abnormal Pap smear     cryo on cervix  . Depression   . Headache(784.0)   . Anemia   . Pregnancy induced hypertension     Chronic hypertension  . Vaginal Pap smear, abnormal    Past Surgical History  Procedure Laterality Date  . Foot surgery  2004-2004    corns removed from both feet, hammertoes repaired  . Gynecologic cryosurgery     Family History  Problem Relation Age of Onset  . Hypertension Mother   . Hypertension Maternal Aunt   . Hypertension Maternal Grandmother   . Anesthesia problems Neg Hx   . Other Neg Hx   . Diabetes Paternal Grandmother      Exam    Uterus:     Pelvic Exam:                                    Skin: normal coloration and turgor, no rashes    Neurologic: oriented, normal mood   Extremities: normal strength, tone, and muscle mass   HEENT PERRLA   Mouth/Teeth dental hygiene good   Neck supple   Cardiovascular: regular rate and rhythm   Respiratory:   appears well, vitals normal, no respiratory distress, acyanotic, normal RR   Abdomen: gravid c/w dates   Urinary:        Assessment:    Pregnancy: [id number] Patient Active Problem List   Diagnosis Date Noted  . GERD (gastroesophageal reflux disease) 02/10/2016  . Encounter for supervision of other normal pregnancy 12/28/2015  . History of preterm delivery, currently pregnant in second trimester 12/28/2015  . Anemia, iron deficiency 02/25/2013  . Essential hypertension 02/17/2013  . Depression 07/05/2009  . OBESITY 03/04/2008        Plan:     Initial labs drawn at [facility] Prenatal vitamins, ASA daily Problem list reviewed and updated. Genetic Screening discussed too late to care  Ultrasound discussed; fetal survey: results reviewed.  Follow up in 2 weeks. 50% of 30 min visit spent on counseling and  coordination of care.  [employer] [place] Protonix for GERD   ARNOLD,JAMES 02/10/2016

## 2016-02-10 NOTE — Progress Notes (Signed)
New ob/28 wk packet given  28 wk labs today tdap vaccine today Home Medicaid Form Completed

## 2016-02-10 NOTE — Progress Notes (Signed)
Nutrition Note: 1st visit consult Pt has h/o obesity and HTN.  Pt has gained 6.1# @ 6286w5d, which is expected. Pt reports eating 1 meal/day.  Pt is not taking PNV. Pt reports heartburn. NKFA. Pt received verbal and written information on tips to relieve heartburn during pregnancy. Pt agrees to start taking PNV. Pt does not have WIC but plans to apply.  F/u as needed. Carloyn Mannerebekah Rileyann Florance MS RD LDN

## 2016-02-11 LAB — GC/CHLAMYDIA PROBE AMP (~~LOC~~) NOT AT ARMC
CHLAMYDIA, DNA PROBE: NEGATIVE
NEISSERIA GONORRHEA: NEGATIVE

## 2016-02-11 LAB — RPR

## 2016-02-11 LAB — GLUCOSE TOLERANCE, 1 HOUR (50G) W/O FASTING: Glucose, 1 Hr, gestational: 95 mg/dL (ref <140)

## 2016-02-11 LAB — HIV ANTIBODY (ROUTINE TESTING W REFLEX): HIV 1&2 Ab, 4th Generation: NONREACTIVE

## 2016-02-14 ENCOUNTER — Encounter: Payer: Self-pay | Admitting: Obstetrics & Gynecology

## 2016-02-22 ENCOUNTER — Other Ambulatory Visit: Payer: Self-pay | Admitting: Obstetrics & Gynecology

## 2016-02-22 ENCOUNTER — Encounter (HOSPITAL_COMMUNITY): Payer: Self-pay

## 2016-02-22 ENCOUNTER — Ambulatory Visit (HOSPITAL_COMMUNITY)
Admission: RE | Admit: 2016-02-22 | Discharge: 2016-02-22 | Disposition: A | Discharge status: Home / Self Care | Payer: Medicaid Other | Source: Ambulatory Visit | Attending: Obstetrics & Gynecology | Admitting: Obstetrics & Gynecology

## 2016-02-22 DIAGNOSIS — O0933 Supervision of pregnancy with insufficient antenatal care, third trimester: Secondary | ICD-10-CM | POA: Diagnosis not present

## 2016-02-22 DIAGNOSIS — O09293 Supervision of pregnancy with other poor reproductive or obstetric history, third trimester: Secondary | ICD-10-CM | POA: Insufficient documentation

## 2016-02-22 DIAGNOSIS — O10013 Pre-existing essential hypertension complicating pregnancy, third trimester: Secondary | ICD-10-CM | POA: Insufficient documentation

## 2016-02-22 DIAGNOSIS — O3443 Maternal care for other abnormalities of cervix, third trimester: Secondary | ICD-10-CM | POA: Diagnosis not present

## 2016-02-22 DIAGNOSIS — IMO0002 Reserved for concepts with insufficient information to code with codable children: Secondary | ICD-10-CM

## 2016-02-22 DIAGNOSIS — Z36 Encounter for antenatal screening of mother: Secondary | ICD-10-CM | POA: Insufficient documentation

## 2016-02-22 DIAGNOSIS — O10919 Unspecified pre-existing hypertension complicating pregnancy, unspecified trimester: Secondary | ICD-10-CM

## 2016-02-22 DIAGNOSIS — Z3A28 28 weeks gestation of pregnancy: Secondary | ICD-10-CM | POA: Insufficient documentation

## 2016-02-22 DIAGNOSIS — Z0489 Encounter for examination and observation for other specified reasons: Secondary | ICD-10-CM

## 2016-02-22 DIAGNOSIS — O09892 Supervision of other high risk pregnancies, second trimester: Secondary | ICD-10-CM

## 2016-02-22 DIAGNOSIS — O09213 Supervision of pregnancy with history of pre-term labor, third trimester: Secondary | ICD-10-CM | POA: Diagnosis not present

## 2016-02-22 DIAGNOSIS — O09212 Supervision of pregnancy with history of pre-term labor, second trimester: Secondary | ICD-10-CM

## 2016-02-25 ENCOUNTER — Ambulatory Visit (INDEPENDENT_AMBULATORY_CARE_PROVIDER_SITE_OTHER): Payer: Medicaid Other | Admitting: Family Medicine

## 2016-02-25 VITALS — BP 124/71 | HR 96 | Wt 203.0 lb

## 2016-02-25 DIAGNOSIS — K219 Gastro-esophageal reflux disease without esophagitis: Secondary | ICD-10-CM

## 2016-02-25 DIAGNOSIS — D509 Iron deficiency anemia, unspecified: Secondary | ICD-10-CM

## 2016-02-25 DIAGNOSIS — O10913 Unspecified pre-existing hypertension complicating pregnancy, third trimester: Secondary | ICD-10-CM | POA: Diagnosis present

## 2016-02-25 DIAGNOSIS — O0990 Supervision of high risk pregnancy, unspecified, unspecified trimester: Secondary | ICD-10-CM | POA: Insufficient documentation

## 2016-02-25 DIAGNOSIS — O99013 Anemia complicating pregnancy, third trimester: Secondary | ICD-10-CM | POA: Diagnosis not present

## 2016-02-25 DIAGNOSIS — O10919 Unspecified pre-existing hypertension complicating pregnancy, unspecified trimester: Secondary | ICD-10-CM

## 2016-02-25 DIAGNOSIS — O0993 Supervision of high risk pregnancy, unspecified, third trimester: Secondary | ICD-10-CM

## 2016-02-25 LAB — POCT URINALYSIS DIP (DEVICE)
GLUCOSE, UA: NEGATIVE mg/dL
Hgb urine dipstick: NEGATIVE
KETONES UR: NEGATIVE mg/dL
Nitrite: NEGATIVE
Protein, ur: 30 mg/dL — AB
SPECIFIC GRAVITY, URINE: 1.02 (ref 1.005–1.030)
Urobilinogen, UA: 1 mg/dL (ref 0.0–1.0)
pH: 6 (ref 5.0–8.0)

## 2016-02-25 NOTE — Progress Notes (Signed)
Subjective:  Christine Cervantes is a 30 y.o. [id number] at 10062w6d being seen today for ongoing prenatal care.  She is currently monitored for the following issues for this high-risk pregnancy and has OBESITY; Depression; Essential hypertension; Anemia, iron deficiency; Encounter for supervision of other normal pregnancy; History of preterm delivery, currently pregnant in second trimester; GERD (gastroesophageal reflux disease); Chronic hypertension during pregnancy, antepartum; and Supervision of high risk pregnancy, antepartum on her problem list.  Patient reports no complaints.  Contractions: Not present.  .  Movement: Present. Denies leaking of fluid.   The following portions of the patient's history were reviewed and updated as appropriate: allergies, current medications, past family history, past medical history, past social history, past surgical history and problem list. Problem list updated.  Objective:   Filed Vitals:   02/25/16 0924  BP: 124/71  Pulse: 96  Weight: 203 lb (92.08 kg)    Fetal Status: Fetal Heart Rate (bpm): 150 Fundal Height: 28 cm Movement: Present     General:  Alert, oriented and cooperative. Patient is in no acute distress.  Skin: Skin is warm and dry. No rash noted.   Cardiovascular: Normal heart rate noted  Respiratory: Normal respiratory effort, no problems with respiration noted  Abdomen: Soft, gravid, appropriate for gestational age. Pain/Pressure: Present     Pelvic:       Cervical exam deferred        Extremities: Normal range of motion.  Edema: Trace  Mental Status: Normal mood and affect. Normal behavior. Normal judgment and thought content.   Urinalysis:      Assessment and Plan:  Pregnancy: [id number] at 2762w6d  1. Supervision of high risk pregnancy, antepartum, third trimester FHT and FH normal.  EFW 75%.   2. Chronic hypertension during pregnancy, antepartum Continue US q4weeks.  BP controlled off meds.  NST twice weekly after 32 weeks.  Patient to  take ASA 81mg .  3. Gastroesophageal reflux disease, esophagitis presence not specified Protonix.   Preterm labor symptoms and general obstetric precautions including but not limited to vaginal bleeding, contractions, leaking of fluid and fetal movement were reviewed in detail with the patient. Please refer to After Visit Summary for other counseling recommendations.  No Follow-up on file.   Levie HeritageJacob J Stinson, DO

## 2016-02-25 NOTE — Progress Notes (Signed)
Pt has some concerns about going into preterm labor. She stated no one has discuss this with her.

## 2016-03-06 ENCOUNTER — Encounter (HOSPITAL_COMMUNITY): Payer: Self-pay | Admitting: *Deleted

## 2016-03-06 ENCOUNTER — Inpatient Hospital Stay (HOSPITAL_COMMUNITY)
Admission: AD | Admit: 2016-03-06 | Discharge: 2016-03-07 | Disposition: A | Discharge status: Home / Self Care | Payer: Medicaid Other | Source: Ambulatory Visit | Attending: Family Medicine | Admitting: Family Medicine

## 2016-03-06 DIAGNOSIS — O26893 Other specified pregnancy related conditions, third trimester: Secondary | ICD-10-CM | POA: Diagnosis not present

## 2016-03-06 DIAGNOSIS — R109 Unspecified abdominal pain: Secondary | ICD-10-CM | POA: Diagnosis not present

## 2016-03-06 DIAGNOSIS — Z3A30 30 weeks gestation of pregnancy: Secondary | ICD-10-CM | POA: Diagnosis not present

## 2016-03-06 DIAGNOSIS — M62830 Muscle spasm of back: Secondary | ICD-10-CM | POA: Diagnosis present

## 2016-03-06 DIAGNOSIS — M549 Dorsalgia, unspecified: Secondary | ICD-10-CM

## 2016-03-06 DIAGNOSIS — Z7982 Long term (current) use of aspirin: Secondary | ICD-10-CM | POA: Diagnosis not present

## 2016-03-06 DIAGNOSIS — O99891 Other specified diseases and conditions complicating pregnancy: Secondary | ICD-10-CM

## 2016-03-06 DIAGNOSIS — O9989 Other specified diseases and conditions complicating pregnancy, childbirth and the puerperium: Secondary | ICD-10-CM

## 2016-03-06 LAB — URINALYSIS, ROUTINE W REFLEX MICROSCOPIC
Bilirubin Urine: NEGATIVE
Glucose, UA: NEGATIVE mg/dL
HGB URINE DIPSTICK: NEGATIVE
Ketones, ur: NEGATIVE mg/dL
Leukocytes, UA: NEGATIVE
Nitrite: NEGATIVE
PH: 6.5 (ref 5.0–8.0)
Protein, ur: NEGATIVE mg/dL
SPECIFIC GRAVITY, URINE: 1.02 (ref 1.005–1.030)

## 2016-03-06 NOTE — MAU Note (Signed)
Pt reports tightness in her stomach for 1.5 weeks and was [facility], pain has not worsened. Also has been having pain in her vaginal area x 3 days.pt states her main concern is that she has been having back "spasms" in her mid/upper back all day today.

## 2016-03-06 NOTE — MAU Provider Note (Signed)
MAU HISTORY AND PHYSICAL  Chief Complaint:  Back spasm  Christine Cervantes is a 30 y.o.  [phone] with IUP at 606w2d presenting for back spasm, tightness in stomach and vaginal pressure.  Back spasm since noon today. Contraction has been going on for about a week.  Patient states she has been having regular, every 5-10 minutes contractions, none vaginal bleeding, intact membranes, with normal fetal movement. Denies dysuria, headache, vision changes, RUQ or epigastric pain. Denies smoking, drinking or drug use.  Past Medical History  Diagnosis Date  . Chlamydia   . Trichimoniasis   . Gonorrhea   . Preterm labor   . Urinary tract infection   . Abnormal Pap smear     cryo on cervix  . Depression   . Headache(784.0)   . Anemia   . Pregnancy induced hypertension     Chronic hypertension  . Vaginal Pap smear, abnormal     Past Surgical History  Procedure Laterality Date  . Foot surgery  2004-2004    corns removed from both feet, hammertoes repaired  . Gynecologic cryosurgery      Family History  Problem Relation Age of Onset  . Hypertension Mother   . Hypertension Maternal Aunt   . Hypertension Maternal Grandmother   . Anesthesia problems Neg Hx   . Other Neg Hx   . Diabetes Paternal Grandmother     Social History  Substance Use Topics  . Smoking status: Never Smoker   . Smokeless tobacco: Never Used  . Alcohol Use: 0.0 oz/week    0 Standard drinks or equivalent per week     Comment: occ before preg    Allergies  Allergen Reactions  . Latex Swelling and Rash    Prescriptions prior to admission  Medication Sig Dispense Refill Last Dose  . acetaminophen (TYLENOL) 500 MG tablet Take 500 mg by mouth every 6 (six) hours as needed for moderate pain. Reported on 02/22/2016   03/05/2016 at Unknown time  . aspirin EC 81 MG tablet Take 1 tablet (81 mg total) by mouth daily. 90 tablet 2 Past Week at Unknown time  . Prenatal Multivit-Min-Fe-FA (PRENATAL/IRON) TABS Take 1 tablet by  mouth daily. 90 each 2 Past Week at Unknown time  . pantoprazole (PROTONIX) 40 MG tablet Take 1 tablet (40 mg total) by mouth daily. (Patient not taking: Reported on 03/06/2016) 30 tablet 3 Taking    Review of Systems - Negative except for what is mentioned in HPI.  Physical Exam  Blood pressure 143/85, pulse 93, temperature 99.3 F (37.4 C), temperature source Oral, resp. rate 17, height 5\' 5"  (1.651 m), weight 208 lb (94.348 kg), last menstrual period 08/07/2015, SpO2 100 %, unknown if currently breastfeeding. GENERAL: Well-developed, well-nourished female in no acute distress.  LUNGS: Clear to auscultation bilaterally.  HEART: Regular rate and rhythm. ABDOMEN: Soft, nontender, nondistended, gravid.  EXTREMITIES: Nontender, no edema, 2+ distal pulses. Cervical Exam: closed Presentation: not assessed FHT:  150/mod var/10x10 accels/no decels Contractions: none on toco   Labs: Results for orders placed or performed during the hospital encounter of 03/06/16 (from the past 24 hour(s))  Urinalysis, Routine w reflex microscopic (not at [facility])   Collection Time: 03/06/16 11:27 PM  Result Value Ref Range   Color, Urine YELLOW YELLOW   APPearance CLEAR CLEAR   Specific Gravity, Urine 1.020 1.005 - 1.030   pH 6.5 5.0 - 8.0   Glucose, UA NEGATIVE NEGATIVE mg/dL   Hgb urine dipstick NEGATIVE NEGATIVE   Bilirubin  Urine NEGATIVE NEGATIVE   Ketones, ur NEGATIVE NEGATIVE mg/dL   Protein, ur NEGATIVE NEGATIVE mg/dL   Nitrite NEGATIVE NEGATIVE   Leukocytes, UA NEGATIVE NEGATIVE    Imaging Studies:  [place] [facility] Ob Follow Up  02/23/2016  OBSTETRICAL ULTRASOUND: This exam was performed within a [place] [facility]. The OB US report was generated in the AS system, and faxed to the ordering physician.  This report is available in the [employer]. See the AS Obstetric US report via the Image Link.  Assessment: ARNELLA Cervantes is  30 y.o. [id number] at [redacted]w[redacted]d presents with back and abdominal pain  likely [facility]. Cervix closed. UA negative.   Plan: -Discussed supportive managements such as ambulation, icy hot & massage -Flexeril 10 mg twice a day as needed for pain -Discussed return precautions -Follow up as needed  Almon Hercules 5/29/201711:53 PM   CNM attestation:  I have seen and examined this patient; I agree with above documentation in the resident's note.   Christine Cervantes is a 30 y.o. [phone] reporting back pain and vag pressure +FM, denies LOF, VB, vaginal discharge.  PE: BP 138/91 mmHg  Pulse 83  Temp(Src) 99.3 F (37.4 C) (Oral)  Resp 17  Ht  (1.651 m)  Wt 94.348 kg (208 lb)  BMI 34.61 kg/m2  SpO2 100%  LMP 08/07/2015 (Exact Date) Gen: calm comfortable, NAD Resp: normal effort, no distress Abd: gravid  ROS, labs, PMH reviewed NST reactive without ctx  Plan: - fetal kick counts reinforced, preterm labor precautions - rx Flexeril for back pain - continue routine follow up in OB clinic  Rosamund Nyland, CNM 1:32 AM  03/07/2016

## 2016-03-06 NOTE — MAU Note (Signed)
PT GETS [facility]  AT [facility]-  WAS THERE ON 5-19-   - TOLD THEM WAS HAVING UC- AND BACK PAIN   - LISTENED  TO [facility].        BACK PAIN STARTED AT 20 WEEKS ,        UC STARTED    1-2 WEEKS   AGO,      AND  SPASMS  IN BACK  STARTED TODAY.     DOES  NOT FEEL   SPASMS  NOW     LAST SEX-   1-2 WEEKS  AGO-  NO D/C  .

## 2016-03-07 DIAGNOSIS — O26893 Other specified pregnancy related conditions, third trimester: Secondary | ICD-10-CM

## 2016-03-07 DIAGNOSIS — M549 Dorsalgia, unspecified: Secondary | ICD-10-CM

## 2016-03-07 MED ORDER — CYCLOBENZAPRINE HCL 10 MG PO TABS: 10.0000 mg | ORAL_TABLET | Freq: Three times a day (TID) | ORAL | Status: DC | PRN

## 2016-03-07 NOTE — Discharge Instructions (Signed)
Braxton Hicks Contractions °Contractions of the uterus can occur throughout pregnancy. Contractions are not always a sign that you are in labor.  °WHAT ARE BRAXTON HICKS CONTRACTIONS?  °Contractions that occur before labor are called Braxton Hicks contractions, or false labor. Toward the end of pregnancy (32-34 weeks), these contractions can develop more often and may become more forceful. This is not true labor because these contractions do not result in opening (dilatation) and thinning of the cervix. They are sometimes difficult to tell apart from true labor because these contractions can be forceful and people have different pain tolerances. You should not feel embarrassed if you go to the hospital with false labor. Sometimes, the only way to tell if you are in true labor is for your health care provider to look for changes in the cervix. °If there are no prenatal problems or other health problems associated with the pregnancy, it is completely safe to be sent home with false labor and await the onset of true labor. °HOW CAN YOU TELL THE DIFFERENCE BETWEEN TRUE AND FALSE LABOR? °False Labor °· The contractions of false labor are usually shorter and not as hard as those of true labor.   °· The contractions are usually irregular.   °· The contractions are often felt in the front of the lower abdomen and in the groin.   °· The contractions may go away when you walk around or change positions while lying down.   °· The contractions get weaker and are shorter lasting as time goes on.   °· The contractions do not usually become progressively stronger, regular, and closer together as with true labor.   °True Labor °· Contractions in true labor last 30-70 seconds, become very regular, usually become more intense, and increase in frequency.   °· The contractions do not go away with walking.   °· The discomfort is usually felt in the top of the uterus and spreads to the lower abdomen and low back.   °· True labor can be  determined by your health care provider with an exam. This will show that the cervix is dilating and getting thinner.   °WHAT TO REMEMBER °· Keep up with your usual exercises and follow other instructions given by your health care provider.   °· Take medicines as directed by your health care provider.   °· Keep your regular prenatal appointments.   °· Eat and drink lightly if you think you are going into labor.   °· If Braxton Hicks contractions are making you uncomfortable:   °¨ Change your position from lying down or resting to walking, or from walking to resting.   °¨ Sit and rest in a tub of warm water.   °¨ Drink 2-3 glasses of water. Dehydration may cause these contractions.   °¨ Do slow and deep breathing several times an hour.   °WHEN SHOULD I SEEK IMMEDIATE MEDICAL CARE? °Seek immediate medical care if: °· Your contractions become stronger, more regular, and closer together.   °· You have fluid leaking or gushing from your vagina.   °· You have a fever.   °· You pass blood-tinged mucus.   °· You have vaginal bleeding.   °· You have continuous abdominal pain.   °· You have low back pain that you never had before.   °· You feel your baby's head pushing down and causing pelvic pressure.   °· Your baby is not moving as much as it used to.   °  °This information is not intended to replace advice given to you by your health care provider. Make sure you discuss any questions you have with your health care   provider. °  °Document Released: 09/25/2005 Document Revised: 09/30/2013 Document Reviewed: 07/07/2013 °Elsevier Interactive Patient Education ©2016 [employer] ° °

## 2016-03-16 ENCOUNTER — Ambulatory Visit (INDEPENDENT_AMBULATORY_CARE_PROVIDER_SITE_OTHER): Payer: Medicaid Other | Admitting: Obstetrics and Gynecology

## 2016-03-16 VITALS — BP 129/74 | HR 69 | Wt 207.0 lb

## 2016-03-16 DIAGNOSIS — O10919 Unspecified pre-existing hypertension complicating pregnancy, unspecified trimester: Secondary | ICD-10-CM

## 2016-03-16 DIAGNOSIS — O09212 Supervision of pregnancy with history of pre-term labor, second trimester: Secondary | ICD-10-CM

## 2016-03-16 DIAGNOSIS — E669 Obesity, unspecified: Secondary | ICD-10-CM | POA: Diagnosis not present

## 2016-03-16 DIAGNOSIS — O09892 Supervision of other high risk pregnancies, second trimester: Secondary | ICD-10-CM

## 2016-03-16 DIAGNOSIS — O99213 Obesity complicating pregnancy, third trimester: Secondary | ICD-10-CM

## 2016-03-16 DIAGNOSIS — O163 Unspecified maternal hypertension, third trimester: Secondary | ICD-10-CM

## 2016-03-16 DIAGNOSIS — O10913 Unspecified pre-existing hypertension complicating pregnancy, third trimester: Secondary | ICD-10-CM

## 2016-03-16 DIAGNOSIS — O09213 Supervision of pregnancy with history of pre-term labor, third trimester: Secondary | ICD-10-CM | POA: Diagnosis not present

## 2016-03-16 DIAGNOSIS — O0993 Supervision of high risk pregnancy, unspecified, third trimester: Secondary | ICD-10-CM

## 2016-03-16 LAB — POCT URINALYSIS DIP (DEVICE)
Bilirubin Urine: NEGATIVE
GLUCOSE, UA: NEGATIVE mg/dL
HGB URINE DIPSTICK: NEGATIVE
Ketones, ur: NEGATIVE mg/dL
LEUKOCYTES UA: NEGATIVE
NITRITE: NEGATIVE
PH: 6 (ref 5.0–8.0)
Protein, ur: 30 mg/dL — AB
Specific Gravity, Urine: 1.02 (ref 1.005–1.030)
UROBILINOGEN UA: 0.2 mg/dL (ref 0.0–1.0)

## 2016-03-16 NOTE — Progress Notes (Signed)
Subjective:  Christine Cervantes is a 30 y.o. [id number] at 2730w5d being seen today for ongoing prenatal care.  She is currently monitored for the following issues for this high-risk pregnancy and has OBESITY; Depression; Essential hypertension; Anemia, iron deficiency; History of preterm delivery, currently pregnant in second trimester; Chronic hypertension during pregnancy, antepartum; and Supervision of high risk pregnancy, antepartum on her problem list.  Patient reports some LBP and vaginal pressure but no decreased FM, VB/spotting, LOF. Pt seen in the MAU for similar s/s in Late May and negative PTL evaluation and given PRN flexeril  The following portions of the patient's history were reviewed and updated as appropriate: allergies, current medications, past family history, past medical history, past social history, past surgical history and problem list. Problem list updated.  Objective:   Filed Vitals:   03/16/16 1114  BP: 129/74  Pulse: 69  Weight: 207 lb (93.895 kg)    Fetal Status: Fetal Heart Rate (bpm): 160   Movement: Present     General:  Alert, oriented and cooperative. Patient is in no acute distress.  Skin: Skin is warm and dry. No rash noted.   Cardiovascular: Normal heart rate noted  Respiratory: Normal respiratory effort, no problems with respiration noted  Abdomen: Soft, gravid, appropriate for gestational age. Pain/Pressure: Present     Pelvic: deferred  Extremities: Normal range of motion.     Mental Status: Normal mood and affect. Normal behavior. Normal judgment and thought content.   Urinalysis:    unable to void  Assessment and Plan:  Pregnancy: [id number] at 1630w5d  1. Hypertension in pregnancy, antepartum, third trimester cHTN Will start 2x/week testing next week and do a growth scan then as well - US MFM OB FOLLOW UP; Future  2. Supervision of high risk pregnancy, antepartum, third trimester Routine care. Belly binder advised for normal pregnancy  discomfort Offer tdap nv.   3. Chronic hypertension during pregnancy, antepartum See above  4. History of preterm delivery, currently pregnant in second trimester Iatrogenic for pre-eclampsia @ 36wks  5. OBESITY BMI 36. No change in plan of care  Preterm labor symptoms and general obstetric precautions including but not limited to vaginal bleeding, contractions, leaking of fluid and fetal movement were reviewed in detail with the patient. Please refer to After Visit Summary for other counseling recommendations.  Return in about 3 days (around 03/19/2016).   [place] Bingharlie Arrietty Dercole, MD

## 2016-03-21 ENCOUNTER — Ambulatory Visit (INDEPENDENT_AMBULATORY_CARE_PROVIDER_SITE_OTHER): Payer: Medicaid Other | Admitting: *Deleted

## 2016-03-21 VITALS — BP 109/84 | HR 101

## 2016-03-21 DIAGNOSIS — O10912 Unspecified pre-existing hypertension complicating pregnancy, second trimester: Secondary | ICD-10-CM | POA: Diagnosis not present

## 2016-03-21 DIAGNOSIS — O10919 Unspecified pre-existing hypertension complicating pregnancy, unspecified trimester: Secondary | ICD-10-CM

## 2016-03-21 NOTE — Progress Notes (Signed)
US for growth tomorrow - BPP added, begin twice weekly fetal testing next week

## 2016-03-21 NOTE — Progress Notes (Signed)
NST reactive 03/21/16

## 2016-03-22 ENCOUNTER — Ambulatory Visit (HOSPITAL_COMMUNITY): Admission: RE | Admit: 2016-03-22 | Payer: Medicaid Other | Source: Ambulatory Visit

## 2016-03-24 ENCOUNTER — Other Ambulatory Visit: Payer: Medicaid Other

## 2016-03-27 ENCOUNTER — Ambulatory Visit (INDEPENDENT_AMBULATORY_CARE_PROVIDER_SITE_OTHER): Payer: Medicaid Other | Admitting: *Deleted

## 2016-03-27 VITALS — BP 112/78 | HR 93

## 2016-03-27 DIAGNOSIS — Z36 Encounter for antenatal screening of mother: Secondary | ICD-10-CM

## 2016-03-27 DIAGNOSIS — O10912 Unspecified pre-existing hypertension complicating pregnancy, second trimester: Secondary | ICD-10-CM | POA: Diagnosis not present

## 2016-03-27 DIAGNOSIS — O10919 Unspecified pre-existing hypertension complicating pregnancy, unspecified trimester: Secondary | ICD-10-CM

## 2016-03-27 NOTE — Progress Notes (Signed)
Pt states she has been having back pain and headache x4 days. She has taken some occasional tylenol with minimal relief. She reports that she tried the flexeril @ bedtime once and it made her very sleepy the next Laylynn Campanella. Pt was encouraged to take successive doses of tylenol according to package directions for about 24 hrs to try to get headache to cease. She may also try taking only 1/2 tablet (5 mg) of flexeril @ bedtime.

## 2016-03-27 NOTE — Progress Notes (Signed)
NST reviewed and reactive.  Jiovany Scheffel L. Harraway-Smith, M.D., FACOG    

## 2016-03-30 ENCOUNTER — Encounter: Payer: Medicaid Other | Admitting: Obstetrics & Gynecology

## 2016-04-04 ENCOUNTER — Other Ambulatory Visit: Payer: Self-pay | Admitting: Obstetrics and Gynecology

## 2016-04-04 ENCOUNTER — Other Ambulatory Visit: Payer: Self-pay | Admitting: Obstetrics & Gynecology

## 2016-04-04 ENCOUNTER — Encounter (HOSPITAL_COMMUNITY): Payer: Self-pay

## 2016-04-04 ENCOUNTER — Ambulatory Visit (HOSPITAL_COMMUNITY)
Admission: RE | Admit: 2016-04-04 | Discharge: 2016-04-04 | Disposition: A | Discharge status: Home / Self Care | Payer: Medicaid Other | Source: Ambulatory Visit | Attending: Obstetrics and Gynecology | Admitting: Obstetrics and Gynecology

## 2016-04-04 ENCOUNTER — Ambulatory Visit (INDEPENDENT_AMBULATORY_CARE_PROVIDER_SITE_OTHER): Payer: Medicaid Other | Admitting: General Practice

## 2016-04-04 VITALS — BP 137/79 | HR 94

## 2016-04-04 DIAGNOSIS — O09213 Supervision of pregnancy with history of pre-term labor, third trimester: Secondary | ICD-10-CM

## 2016-04-04 DIAGNOSIS — O9989 Other specified diseases and conditions complicating pregnancy, childbirth and the puerperium: Secondary | ICD-10-CM

## 2016-04-04 DIAGNOSIS — O09893 Supervision of other high risk pregnancies, third trimester: Secondary | ICD-10-CM

## 2016-04-04 DIAGNOSIS — R350 Frequency of micturition: Secondary | ICD-10-CM

## 2016-04-04 DIAGNOSIS — Z3A34 34 weeks gestation of pregnancy: Secondary | ICD-10-CM | POA: Diagnosis not present

## 2016-04-04 DIAGNOSIS — O163 Unspecified maternal hypertension, third trimester: Secondary | ICD-10-CM | POA: Diagnosis present

## 2016-04-04 DIAGNOSIS — M549 Dorsalgia, unspecified: Secondary | ICD-10-CM | POA: Diagnosis not present

## 2016-04-04 DIAGNOSIS — O0933 Supervision of pregnancy with insufficient antenatal care, third trimester: Secondary | ICD-10-CM

## 2016-04-04 DIAGNOSIS — O09293 Supervision of pregnancy with other poor reproductive or obstetric history, third trimester: Secondary | ICD-10-CM | POA: Diagnosis not present

## 2016-04-04 DIAGNOSIS — O10013 Pre-existing essential hypertension complicating pregnancy, third trimester: Secondary | ICD-10-CM | POA: Diagnosis present

## 2016-04-04 DIAGNOSIS — O3443 Maternal care for other abnormalities of cervix, third trimester: Secondary | ICD-10-CM | POA: Insufficient documentation

## 2016-04-04 DIAGNOSIS — O10919 Unspecified pre-existing hypertension complicating pregnancy, unspecified trimester: Secondary | ICD-10-CM

## 2016-04-04 LAB — POCT URINALYSIS DIP (DEVICE)
Bilirubin Urine: NEGATIVE
GLUCOSE, UA: NEGATIVE mg/dL
Hgb urine dipstick: NEGATIVE
Ketones, ur: NEGATIVE mg/dL
NITRITE: NEGATIVE
PH: 6 (ref 5.0–8.0)
PROTEIN: NEGATIVE mg/dL
Specific Gravity, Urine: 1.02 (ref 1.005–1.030)
UROBILINOGEN UA: 0.2 mg/dL (ref 0.0–1.0)

## 2016-04-04 NOTE — Progress Notes (Signed)
Patient reports mid back pain & frequent urination. Patient states back pain is unrelieved my muscle relaxer. Will send urine for culture- patient informed of processing time. Discussed warm (not hot) epsom salt bath, heating pad, and muscle rubs for pain. Also discussed drinking plenty of water in case of UTI. Patient verbalizes understanding

## 2016-04-06 LAB — CULTURE, OB URINE: Colony Count: 3000

## 2016-04-08 ENCOUNTER — Encounter (HOSPITAL_COMMUNITY): Payer: Self-pay

## 2016-04-08 ENCOUNTER — Inpatient Hospital Stay (HOSPITAL_COMMUNITY)
Admission: AD | Admit: 2016-04-08 | Discharge: 2016-04-08 | Disposition: A | Discharge status: Home / Self Care | Payer: Medicaid Other | Source: Ambulatory Visit | Attending: Obstetrics & Gynecology | Admitting: Obstetrics & Gynecology

## 2016-04-08 DIAGNOSIS — M545 Low back pain: Secondary | ICD-10-CM | POA: Insufficient documentation

## 2016-04-08 DIAGNOSIS — O210 Mild hyperemesis gravidarum: Secondary | ICD-10-CM | POA: Diagnosis present

## 2016-04-08 DIAGNOSIS — O26893 Other specified pregnancy related conditions, third trimester: Secondary | ICD-10-CM

## 2016-04-08 DIAGNOSIS — O133 Gestational [pregnancy-induced] hypertension without significant proteinuria, third trimester: Secondary | ICD-10-CM | POA: Diagnosis not present

## 2016-04-08 DIAGNOSIS — Z79899 Other long term (current) drug therapy: Secondary | ICD-10-CM | POA: Diagnosis not present

## 2016-04-08 DIAGNOSIS — Z91040 Latex allergy status: Secondary | ICD-10-CM | POA: Diagnosis not present

## 2016-04-08 DIAGNOSIS — M549 Dorsalgia, unspecified: Secondary | ICD-10-CM

## 2016-04-08 DIAGNOSIS — Z7982 Long term (current) use of aspirin: Secondary | ICD-10-CM | POA: Insufficient documentation

## 2016-04-08 DIAGNOSIS — Z3A35 35 weeks gestation of pregnancy: Secondary | ICD-10-CM | POA: Diagnosis not present

## 2016-04-08 LAB — URINALYSIS, ROUTINE W REFLEX MICROSCOPIC
Bilirubin Urine: NEGATIVE
GLUCOSE, UA: NEGATIVE mg/dL
HGB URINE DIPSTICK: NEGATIVE
Ketones, ur: NEGATIVE mg/dL
Nitrite: NEGATIVE
PROTEIN: NEGATIVE mg/dL
pH: 6.5 (ref 5.0–8.0)

## 2016-04-08 LAB — URINE MICROSCOPIC-ADD ON

## 2016-04-08 MED ORDER — CYCLOBENZAPRINE HCL 10 MG PO TABS: 10.0000 mg | ORAL_TABLET | Freq: Once | ORAL | Status: DC

## 2016-04-08 MED ORDER — OXYCODONE-ACETAMINOPHEN 5-325 MG PO TABS
1.0000 | ORAL_TABLET | Freq: Once | ORAL | Status: AC
  Administered 2016-04-08: 1 via ORAL
  Filled 2016-04-08: qty 1

## 2016-04-08 MED ORDER — LACTATED RINGERS IV BOLUS (SEPSIS)
500.0000 mL | Freq: Once | INTRAVENOUS | Status: AC
Start: 2016-04-08 — End: 2016-04-08
  Administered 2016-04-08: 500 mL via INTRAVENOUS

## 2016-04-08 MED ORDER — SODIUM CHLORIDE 0.9 % IV SOLN
8.0000 mg | Freq: Once | INTRAVENOUS | Status: AC
  Administered 2016-04-08: 8 mg via INTRAVENOUS
  Filled 2016-04-08: qty 4

## 2016-04-08 NOTE — MAU Note (Signed)
Pt here with c/o back pain since middle of the week. Was seen in the office on Wednesday and had urine sent off.  NST done on Wednesday.  Also here with nausea and vomiting since about 2330 tonight.

## 2016-04-08 NOTE — MAU Provider Note (Signed)
History     CSN: [id number]  Arrival date and time: 04/08/16 [id number]   First Provider Initiated Contact with Patient 04/08/16 0300      Chief Complaint  Patient presents with  . Nausea  . Emesis  . Back Pain   HPI Ms Christine Cervantes is a 30yo [id number] @ 35.0wks by 9wk scan who presents for eval of low mid back pain which began a few days ago; also she has had approx 4 episodes of vomiting since 2330 this evening. Denies leaking or bldg; no ctx at home. [facility] and has been remarkable for 1) cHTN 2) hx pre-e requiring preterm IOL 3) depression. She was seen at the office on 6/27 and mentioned the back pain- urine sent for culture= negative.  OB History    Gravida Para Term Preterm AB TAB SAB Ectopic Multiple Living   6 3 2 1 2  2   3       Past Medical History  Diagnosis Date  . Chlamydia   . Trichimoniasis   . Gonorrhea   . Preterm labor   . Urinary tract infection   . Abnormal Pap smear     cryo on cervix  . Depression   . Headache(784.0)   . Anemia   . Pregnancy induced hypertension     Chronic hypertension  . Vaginal Pap smear, abnormal     Past Surgical History  Procedure Laterality Date  . Foot surgery  2004-2004    corns removed from both feet, hammertoes repaired  . Gynecologic cryosurgery      Family History  Problem Relation Age of Onset  . Hypertension Mother   . Hypertension Maternal Aunt   . Hypertension Maternal Grandmother   . Anesthesia problems Neg Hx   . Other Neg Hx   . Diabetes Paternal Grandmother     Social History  Substance Use Topics  . Smoking status: Never Smoker   . Smokeless tobacco: Never Used  . Alcohol Use: 0.0 oz/week    0 Standard drinks or equivalent per week     Comment: occ before preg    Allergies:  Allergies  Allergen Reactions  . Latex Swelling and Rash    Prescriptions prior to admission  Medication Sig Dispense Refill Last Dose  . acetaminophen (TYLENOL) 500 MG tablet Take 500 mg by mouth  every 6 (six) hours as needed for moderate pain. Reported on 02/22/2016   Past Week at Unknown time  . aspirin EC 81 MG tablet Take 1 tablet (81 mg total) by mouth daily. 90 tablet 2 04/07/2016 at Unknown time  . cyclobenzaprine (FLEXERIL) 10 MG tablet Take 1 tablet (10 mg total) by mouth 3 (three) times daily as needed for muscle spasms. 20 tablet 0 Past Week at Unknown time  . pantoprazole (PROTONIX) 40 MG tablet Take 1 tablet (40 mg total) by mouth daily. (Patient not taking: Reported on 03/16/2016) 30 tablet 3 Not Taking  . Prenatal Multivit-Min-Fe-FA (PRENATAL/IRON) TABS Take 1 tablet by mouth daily. 90 each 2 More than a month at Unknown time    ROS No other pertinents other than what is listed in HPI Physical Exam   Blood pressure 125/87, pulse 89, temperature 98.1 F (36.7 C), temperature source Oral, resp. rate 18, height 5\' 5"  (1.651 m), weight 94.802 kg (209 lb), last menstrual period 08/07/2015, SpO2 98 %, unknown if currently breastfeeding.  Physical Exam  Constitutional: She is oriented to person, place, and time. She appears  well-developed.  HENT:  Head: Normocephalic.  Neck: Normal range of motion.  Cardiovascular: Normal rate.   Respiratory: Effort normal.  GI:  EFM 130s, +accels, no decels Ctx q 2-167mins, some are irritability  Musculoskeletal:  Neg CVAT  Neurological: She is alert and oriented to person, place, and time.  Skin: Skin is warm and dry.  Psychiatric: She has a normal mood and affect. Her behavior is normal. Thought content normal.   Urinalysis    Component Value Date/Time   COLORURINE YELLOW 04/08/2016 0050   APPEARANCEUR CLEAR 04/08/2016 0050   LABSPEC <1.005* 04/08/2016 0050   PHURINE 6.5 04/08/2016 0050   GLUCOSEU NEGATIVE 04/08/2016 0050   HGBUR NEGATIVE 04/08/2016 0050   HGBUR trace-intact 05/08/2007 1614   BILIRUBINUR NEGATIVE 04/08/2016 0050   BILIRUBINUR SMALL 01/28/2016 1131   KETONESUR NEGATIVE 04/08/2016 0050   PROTEINUR NEGATIVE  04/08/2016 0050   PROTEINUR 30 01/28/2016 1131   UROBILINOGEN 0.2 04/04/2016 1540   UROBILINOGEN 1.0 01/28/2016 1131   NITRITE NEGATIVE 04/08/2016 0050   NITRITE NEG 01/28/2016 1131   LEUKOCYTESUR TRACE* 04/08/2016 0050   Micro: 0-5 SE, rare bacteria   MAU Course  Procedures  MDM Given IVF w/ Zofran 8mg - improved nausea, no vomiting Percocet x 1- has been <30 mins since taking  Developed some abd tightening once here/ctx per toco  Assessment and Plan  IUP@35 .0 Musculoskeletal back pain Preterm ctx without cx change  D/C home w/ PTL precautions Comfort measures rev'd F/U on 7/3 as scheduled for OB visit  Cam HaiSHAW, [place] Longbottom CNM 04/08/2016, 3:36 AM

## 2016-04-10 ENCOUNTER — Other Ambulatory Visit: Payer: Medicaid Other

## 2016-04-10 ENCOUNTER — Other Ambulatory Visit (HOSPITAL_COMMUNITY)
Admission: RE | Admit: 2016-04-10 | Discharge: 2016-04-10 | Disposition: A | Discharge status: Home / Self Care | Payer: Medicaid Other | Source: Ambulatory Visit | Attending: Obstetrics and Gynecology | Admitting: Obstetrics and Gynecology

## 2016-04-10 ENCOUNTER — Ambulatory Visit (INDEPENDENT_AMBULATORY_CARE_PROVIDER_SITE_OTHER): Payer: Medicaid Other | Admitting: Obstetrics and Gynecology

## 2016-04-10 VITALS — BP 129/84 | HR 93

## 2016-04-10 DIAGNOSIS — O10912 Unspecified pre-existing hypertension complicating pregnancy, second trimester: Secondary | ICD-10-CM | POA: Diagnosis not present

## 2016-04-10 DIAGNOSIS — O0990 Supervision of high risk pregnancy, unspecified, unspecified trimester: Secondary | ICD-10-CM

## 2016-04-10 DIAGNOSIS — Z36 Encounter for antenatal screening of mother: Secondary | ICD-10-CM | POA: Diagnosis not present

## 2016-04-10 DIAGNOSIS — Z113 Encounter for screening for infections with a predominantly sexual mode of transmission: Secondary | ICD-10-CM | POA: Diagnosis present

## 2016-04-10 DIAGNOSIS — O10919 Unspecified pre-existing hypertension complicating pregnancy, unspecified trimester: Secondary | ICD-10-CM

## 2016-04-10 MED ORDER — PROMETHAZINE HCL 25 MG PO TABS: 25.0000 mg | ORAL_TABLET | Freq: Four times a day (QID) | ORAL | Status: DC | PRN

## 2016-04-10 NOTE — Progress Notes (Signed)
Pt had MAU visit on 7/1 and requests to see MD today due to continuous back pain and has not seen provider since 6/8.

## 2016-04-10 NOTE — Progress Notes (Signed)
Subjective:  Christine Cervantes is a 30 y.o. [id number] at 7725w2d being seen today for ongoing prenatal care.  She is currently monitored for the following issues for this low-risk pregnancy and has OBESITY; Depression; Essential hypertension; Anemia, iron deficiency; History of preterm delivery, currently pregnant in second trimester; Chronic hypertension during pregnancy, antepartum; and Supervision of high risk pregnancy, antepartum on her problem list.  Patient reports no complaints.   .  .  Movement: Present. Denies leaking of fluid.   The following portions of the patient's history were reviewed and updated as appropriate: allergies, current medications, past family history, past medical history, past social history, past surgical history and problem list. Problem list updated.  Objective:   Filed Vitals:   04/10/16 1310  BP: 129/84  Pulse: 93    Fetal Status:     Movement: Present     General:  Alert, oriented and cooperative. Patient is in no acute distress.  Skin: Skin is warm and dry. No rash noted.   Cardiovascular: Normal heart rate noted  Respiratory: Normal respiratory effort, no problems with respiration noted  Abdomen: Soft, gravid, appropriate for gestational age.       Pelvic: Cervical exam deferred        Extremities: Normal range of motion.     Mental Status: Normal mood and affect. Normal behavior. Normal judgment and thought content.   Urinalysis:      Assessment and Plan:  Pregnancy: [id number] at 1525w2d  1. Chronic hypertension during pregnancy, antepartum - Fetal nonstress test reactive - Amniotic fluid index wnl - bp appropriate - u/s for growth in 2 wks - g, c, gbs collected - nst later this week  # back pain - add belly band    Preterm labor symptoms and general obstetric precautions including but not limited to vaginal bleeding, contractions, leaking of fluid and fetal movement were reviewed in detail with the patient. Please refer to After Visit Summary  for other counseling recommendations.    Kathrynn RunningNoah Bedford Jalaya Sarver, MD

## 2016-04-12 LAB — GC/CHLAMYDIA PROBE AMP (~~LOC~~) NOT AT ARMC
CHLAMYDIA, DNA PROBE: NEGATIVE
Neisseria Gonorrhea: NEGATIVE

## 2016-04-12 LAB — CULTURE, BETA STREP (GROUP B ONLY)

## 2016-04-13 ENCOUNTER — Encounter: Payer: Self-pay | Admitting: Obstetrics and Gynecology

## 2016-04-13 ENCOUNTER — Ambulatory Visit (INDEPENDENT_AMBULATORY_CARE_PROVIDER_SITE_OTHER): Payer: Medicaid Other | Admitting: Family

## 2016-04-13 VITALS — BP 120/81 | HR 115 | Wt 212.6 lb

## 2016-04-13 DIAGNOSIS — O0993 Supervision of high risk pregnancy, unspecified, third trimester: Secondary | ICD-10-CM

## 2016-04-13 DIAGNOSIS — O10912 Unspecified pre-existing hypertension complicating pregnancy, second trimester: Secondary | ICD-10-CM | POA: Diagnosis not present

## 2016-04-13 DIAGNOSIS — O10919 Unspecified pre-existing hypertension complicating pregnancy, unspecified trimester: Secondary | ICD-10-CM

## 2016-04-13 DIAGNOSIS — O09212 Supervision of pregnancy with history of pre-term labor, second trimester: Secondary | ICD-10-CM

## 2016-04-13 DIAGNOSIS — O09892 Supervision of other high risk pregnancies, second trimester: Secondary | ICD-10-CM

## 2016-04-13 DIAGNOSIS — B951 Streptococcus, group B, as the cause of diseases classified elsewhere: Secondary | ICD-10-CM | POA: Insufficient documentation

## 2016-04-13 LAB — POCT URINALYSIS DIP (DEVICE)
Bilirubin Urine: NEGATIVE
GLUCOSE, UA: NEGATIVE mg/dL
Hgb urine dipstick: NEGATIVE
Ketones, ur: 15 mg/dL — AB
NITRITE: NEGATIVE
Protein, ur: NEGATIVE mg/dL
SPECIFIC GRAVITY, URINE: 1.015 (ref 1.005–1.030)
Urobilinogen, UA: 1 mg/dL (ref 0.0–1.0)
pH: 7 (ref 5.0–8.0)

## 2016-04-13 NOTE — Progress Notes (Signed)
Pt reports continued nausea, vomiting and headaches.  She does not have good results from po phenergan.

## 2016-04-13 NOTE — Progress Notes (Signed)
  Subjective:  Christine Cervantes is a 30 y.o. [id number] at 249w5d being seen today for ongoing prenatal care.  She is currently monitored for the following issues for this high-risk pregnancy and has OBESITY; Depression; Essential hypertension; Anemia, iron deficiency; History of preterm delivery, currently pregnant in second trimester; Chronic hypertension during pregnancy, antepartum; Supervision of high risk pregnancy, antepartum; and Positive GBS test on her problem list.  Patient reports backache.  Pain is a dull ache in lower back.  No radiation down leg.  Reports being seen in MAU on 04/08/16 and informed cervix was 2-3 cm.  Also reports unable to drink fluid at night, but able to tolerate during the day.  Intermittent headaches, none at this time.    Contractions: Irregular. Vag. Bleeding: None.  Movement: Present. Denies leaking of fluid.   The following portions of the patient's history were reviewed and updated as appropriate: allergies, current medications, past family history, past medical history, past social history, past surgical history and problem list. Problem list updated.  Objective:   Filed Vitals:   04/13/16 1100  BP: 120/81  Pulse: 115  Weight: 212 lb 9.6 oz (96.435 kg)    Fetal Status: Fetal Heart Rate (bpm): NST-R Fundal Height: 35 cm Movement: Present  Presentation: Vertex  General:  Alert, oriented and cooperative. Patient is in no acute distress.  Skin: Skin is warm and dry. No rash noted.   Cardiovascular: Normal heart rate noted  Respiratory: Normal respiratory effort, no problems with respiration noted  Abdomen: Soft, gravid, appropriate for gestational age. Pain/Pressure: Present     Pelvic:  Cervical exam performed Dilation: 2.5 Effacement (%): Thick Station: Ballotable; (No change since MAU visit)  Extremities: Normal range of motion.  Edema: Trace  Mental Status: Normal mood and affect. Normal behavior. Normal judgment and thought content.   Urinalysis: Urine  Protein: Negative Urine Glucose: Negative  Assessment and Plan:  Pregnancy: [id number] at 839w5d  1. Chronic hypertension during pregnancy, antepartum - NST reactive, toco no contractions - Continue close observation  2. Supervision of high risk pregnancy, antepartum, third trimester - Continue surveillance   3. History of preterm delivery, currently pregnant in second trimester - Induced for PreX  Preterm labor symptoms and general obstetric precautions including but not limited to vaginal bleeding, contractions, leaking of fluid and fetal movement were reviewed in detail with the patient. Please refer to After Visit Summary for other counseling recommendations.  Return in about 4 days (around 04/17/2016) for 7/10 or 7/11 NST/AFI.   Eino FarberWalidah Kennith GainN Karim, CNM

## 2016-04-15 ENCOUNTER — Encounter (HOSPITAL_COMMUNITY): Payer: Self-pay

## 2016-04-15 ENCOUNTER — Inpatient Hospital Stay (HOSPITAL_COMMUNITY)
Admission: AD | Admit: 2016-04-15 | Discharge: 2016-04-18 | DRG: 774 | Disposition: A | Discharge status: Home / Self Care | Payer: Medicaid Other | Source: Ambulatory Visit | Attending: Obstetrics & Gynecology | Admitting: Obstetrics & Gynecology

## 2016-04-15 DIAGNOSIS — R51 Headache: Secondary | ICD-10-CM

## 2016-04-15 DIAGNOSIS — O99824 Streptococcus B carrier state complicating childbirth: Secondary | ICD-10-CM | POA: Diagnosis present

## 2016-04-15 DIAGNOSIS — Z833 Family history of diabetes mellitus: Secondary | ICD-10-CM

## 2016-04-15 DIAGNOSIS — R519 Headache, unspecified: Secondary | ICD-10-CM

## 2016-04-15 DIAGNOSIS — Z8249 Family history of ischemic heart disease and other diseases of the circulatory system: Secondary | ICD-10-CM | POA: Diagnosis not present

## 2016-04-15 DIAGNOSIS — O114 Pre-existing hypertension with pre-eclampsia, complicating childbirth: Principal | ICD-10-CM | POA: Diagnosis present

## 2016-04-15 DIAGNOSIS — J069 Acute upper respiratory infection, unspecified: Secondary | ICD-10-CM | POA: Diagnosis present

## 2016-04-15 DIAGNOSIS — O9952 Diseases of the respiratory system complicating childbirth: Secondary | ICD-10-CM | POA: Diagnosis present

## 2016-04-15 DIAGNOSIS — Z3A36 36 weeks gestation of pregnancy: Secondary | ICD-10-CM

## 2016-04-15 DIAGNOSIS — O1002 Pre-existing essential hypertension complicating childbirth: Secondary | ICD-10-CM | POA: Diagnosis present

## 2016-04-15 DIAGNOSIS — O10919 Unspecified pre-existing hypertension complicating pregnancy, unspecified trimester: Secondary | ICD-10-CM

## 2016-04-15 DIAGNOSIS — O26893 Other specified pregnancy related conditions, third trimester: Secondary | ICD-10-CM

## 2016-04-15 DIAGNOSIS — O1092 Unspecified pre-existing hypertension complicating childbirth: Secondary | ICD-10-CM | POA: Diagnosis not present

## 2016-04-15 DIAGNOSIS — O165 Unspecified maternal hypertension, complicating the puerperium: Secondary | ICD-10-CM | POA: Diagnosis present

## 2016-04-15 LAB — PROTEIN / CREATININE RATIO, URINE
CREATININE, URINE: 99 mg/dL
PROTEIN CREATININE RATIO: 0.17 mg/mg{creat} — AB (ref 0.00–0.15)
Total Protein, Urine: 17 mg/dL

## 2016-04-15 LAB — TYPE AND SCREEN
ABO/RH(D): O POS
ANTIBODY SCREEN: NEGATIVE

## 2016-04-15 LAB — URINE MICROSCOPIC-ADD ON: RBC / HPF: NONE SEEN RBC/hpf (ref 0–5)

## 2016-04-15 LAB — URINALYSIS, ROUTINE W REFLEX MICROSCOPIC
Bilirubin Urine: NEGATIVE
Glucose, UA: NEGATIVE mg/dL
HGB URINE DIPSTICK: NEGATIVE
Ketones, ur: NEGATIVE mg/dL
NITRITE: NEGATIVE
PROTEIN: NEGATIVE mg/dL
Specific Gravity, Urine: <1.005 — ABNORMAL LOW (ref 1.005–1.030)
pH: 7 (ref 5.0–8.0)

## 2016-04-15 LAB — COMPREHENSIVE METABOLIC PANEL
ALBUMIN: 2.6 g/dL — AB (ref 3.5–5.0)
ALT: 7 U/L — ABNORMAL LOW (ref 14–54)
ANION GAP: 8 (ref 5–15)
AST: 13 U/L — ABNORMAL LOW (ref 15–41)
Alkaline Phosphatase: 214 U/L — ABNORMAL HIGH (ref 38–126)
BILIRUBIN TOTAL: 0.4 mg/dL (ref 0.3–1.2)
BUN: <5 mg/dL — ABNORMAL LOW (ref 6–20)
CO2: 20 mmol/L — ABNORMAL LOW (ref 22–32)
Calcium: 8.2 mg/dL — ABNORMAL LOW (ref 8.9–10.3)
Chloride: 105 mmol/L (ref 101–111)
Creatinine, Ser: 0.53 mg/dL (ref 0.44–1.00)
GFR calc Af Amer: >60 mL/min (ref >60)
GFR calc non Af Amer: >60 mL/min (ref >60)
GLUCOSE: 103 mg/dL — AB (ref 65–99)
Potassium: 3.8 mmol/L (ref 3.5–5.1)
Sodium: 133 mmol/L — ABNORMAL LOW (ref 135–145)
TOTAL PROTEIN: 6.1 g/dL — AB (ref 6.5–8.1)

## 2016-04-15 LAB — CBC
HEMATOCRIT: 31.8 % — AB (ref 36.0–46.0)
Hemoglobin: 10.1 g/dL — ABNORMAL LOW (ref 12.0–15.0)
MCH: 25.6 pg — ABNORMAL LOW (ref 26.0–34.0)
MCHC: 31.8 g/dL (ref 30.0–36.0)
MCV: 80.7 fL (ref 78.0–100.0)
PLATELETS: 213 10*3/uL (ref 150–400)
RBC: 3.94 MIL/uL (ref 3.87–5.11)
RDW: 14.2 % (ref 11.5–15.5)
WBC: 6.5 10*3/uL (ref 4.0–10.5)

## 2016-04-15 LAB — RPR: RPR Ser Ql: NONREACTIVE

## 2016-04-15 MED ORDER — OXYCODONE-ACETAMINOPHEN 5-325 MG PO TABS
2.0000 | ORAL_TABLET | ORAL | Status: DC | PRN
  Administered 2016-04-16: 2 via ORAL
  Filled 2016-04-15: qty 2

## 2016-04-15 MED ORDER — FENTANYL 2.5 MCG/ML BUPIVACAINE 1/10 % EPIDURAL INFUSION (WH - ANES)
14.0000 mL/h | INTRAMUSCULAR | Status: DC | PRN
  Administered 2016-04-16: 14 mL/h via EPIDURAL
  Administered 2016-04-16: 12 mL/h via EPIDURAL
  Filled 2016-04-15: qty 125

## 2016-04-15 MED ORDER — ACETAMINOPHEN 325 MG PO TABS
650.0000 mg | ORAL_TABLET | Freq: Four times a day (QID) | ORAL | Status: DC | PRN
  Administered 2016-04-15: 650 mg via ORAL
  Filled 2016-04-15: qty 2

## 2016-04-15 MED ORDER — EPHEDRINE 5 MG/ML INJ
10.0000 mg | INTRAVENOUS | Status: DC | PRN
  Filled 2016-04-15: qty 2

## 2016-04-15 MED ORDER — DIPHENHYDRAMINE HCL 50 MG/ML IJ SOLN: 12.5000 mg | INTRAMUSCULAR | Status: DC | PRN

## 2016-04-15 MED ORDER — LACTATED RINGERS IV SOLN
INTRAVENOUS | Status: DC
  Administered 2016-04-15 – 2016-04-17 (×4): via INTRAVENOUS

## 2016-04-15 MED ORDER — BETAMETHASONE SOD PHOS & ACET 6 (3-3) MG/ML IJ SUSP
12.0000 mg | Freq: Every day | INTRAMUSCULAR | Status: DC
  Administered 2016-04-15: 12 mg via INTRAMUSCULAR
  Filled 2016-04-15 (×2): qty 2

## 2016-04-15 MED ORDER — OXYTOCIN 40 UNITS IN LACTATED RINGERS INFUSION - SIMPLE MED
2.5000 [IU]/h | INTRAVENOUS | Status: DC
Start: 2016-04-15 — End: 2016-04-18

## 2016-04-15 MED ORDER — SOD CITRATE-CITRIC ACID 500-334 MG/5ML PO SOLN
30.0000 mL | ORAL | Status: DC | PRN
  Administered 2016-04-15: 30 mL via ORAL
  Filled 2016-04-15: qty 30
  Filled 2016-04-15: qty 15

## 2016-04-15 MED ORDER — MAGNESIUM SULFATE 50 % IJ SOLN
2.0000 g/h | INTRAVENOUS | Status: AC
  Administered 2016-04-15 – 2016-04-17 (×3): 2 g/h via INTRAVENOUS
  Filled 2016-04-15 (×3): qty 80

## 2016-04-15 MED ORDER — OXYTOCIN 40 UNITS IN LACTATED RINGERS INFUSION - SIMPLE MED
1.0000 m[IU]/min | INTRAVENOUS | Status: DC
  Administered 2016-04-15: 2 m[IU]/min via INTRAVENOUS
  Filled 2016-04-15: qty 1000

## 2016-04-15 MED ORDER — PHENYLEPHRINE 40 MCG/ML (10ML) SYRINGE FOR IV PUSH (FOR BLOOD PRESSURE SUPPORT)
80.0000 ug | PREFILLED_SYRINGE | INTRAVENOUS | Status: DC | PRN
  Filled 2016-04-15: qty 10
  Filled 2016-04-15: qty 5

## 2016-04-15 MED ORDER — LACTATED RINGERS IV SOLN: 500.0000 mL | Freq: Once | INTRAVENOUS | Status: DC

## 2016-04-15 MED ORDER — ONDANSETRON 8 MG PO TBDP
8.0000 mg | ORAL_TABLET | Freq: Once | ORAL | Status: AC
  Administered 2016-04-15: 8 mg via ORAL
  Filled 2016-04-15: qty 1

## 2016-04-15 MED ORDER — PENICILLIN G POTASSIUM 5000000 UNITS IJ SOLR
2.5000 10*6.[IU] | INTRAVENOUS | Status: DC
  Administered 2016-04-15 – 2016-04-16 (×6): 2.5 10*6.[IU] via INTRAVENOUS
  Filled 2016-04-15 (×10): qty 2.5

## 2016-04-15 MED ORDER — HYDRALAZINE HCL 20 MG/ML IJ SOLN: 10.0000 mg | Freq: Once | INTRAMUSCULAR | Status: DC | PRN

## 2016-04-15 MED ORDER — LIDOCAINE HCL (PF) 1 % IJ SOLN
30.0000 mL | INTRAMUSCULAR | Status: DC | PRN
  Filled 2016-04-15: qty 30

## 2016-04-15 MED ORDER — OXYTOCIN BOLUS FROM INFUSION: 500.0000 mL | INTRAVENOUS | Status: DC

## 2016-04-15 MED ORDER — LABETALOL HCL 5 MG/ML IV SOLN: 20.0000 mg | INTRAVENOUS | Status: DC | PRN

## 2016-04-15 MED ORDER — FENTANYL CITRATE (PF) 100 MCG/2ML IJ SOLN
100.0000 ug | INTRAMUSCULAR | Status: DC | PRN
Start: 2016-04-15 — End: 2016-04-16
  Administered 2016-04-15 – 2016-04-16 (×2): 100 ug via INTRAVENOUS
  Filled 2016-04-15 (×2): qty 2

## 2016-04-15 MED ORDER — TERBUTALINE SULFATE 1 MG/ML IJ SOLN: 0.2500 mg | Freq: Once | INTRAMUSCULAR | Status: DC | PRN

## 2016-04-15 MED ORDER — OXYCODONE-ACETAMINOPHEN 5-325 MG PO TABS
1.0000 | ORAL_TABLET | ORAL | Status: DC | PRN
  Administered 2016-04-16: 1 via ORAL
  Filled 2016-04-15: qty 1

## 2016-04-15 MED ORDER — MAGNESIUM SULFATE BOLUS VIA INFUSION
4.0000 g | Freq: Once | INTRAVENOUS | Status: AC
  Administered 2016-04-15: 4 g via INTRAVENOUS
  Filled 2016-04-15: qty 500

## 2016-04-15 MED ORDER — ONDANSETRON HCL 4 MG/2ML IJ SOLN
4.0000 mg | Freq: Four times a day (QID) | INTRAMUSCULAR | Status: DC | PRN
  Administered 2016-04-15: 4 mg via INTRAVENOUS
  Filled 2016-04-15: qty 2

## 2016-04-15 MED ORDER — ACETAMINOPHEN 325 MG PO TABS
650.0000 mg | ORAL_TABLET | ORAL | Status: DC | PRN
  Filled 2016-04-15: qty 2

## 2016-04-15 MED ORDER — LACTATED RINGERS IV SOLN: 500.0000 mL | INTRAVENOUS | Status: DC | PRN

## 2016-04-15 MED ORDER — PHENYLEPHRINE 40 MCG/ML (10ML) SYRINGE FOR IV PUSH (FOR BLOOD PRESSURE SUPPORT)
80.0000 ug | PREFILLED_SYRINGE | INTRAVENOUS | Status: DC | PRN
  Filled 2016-04-15: qty 5

## 2016-04-15 MED ORDER — PENICILLIN G POTASSIUM 5000000 UNITS IJ SOLR
5.0000 10*6.[IU] | Freq: Once | INTRAVENOUS | Status: AC
  Administered 2016-04-15: 5 10*6.[IU] via INTRAVENOUS
  Filled 2016-04-15: qty 5

## 2016-04-15 NOTE — Progress Notes (Addendum)
Patient ID: Christine Cervantes, female   DOB: July 16, 1986, 30 y.o.   MRN: [id number] Christine Cervantes is a 30 y.o. [id number] at 3058w0d admitteBettey Costad for early labor, chtn-no meds, ha  Subjective: Doing well, uncomfortable w/ uc's, not to the point where she wants meds/epidural at this time. Reports intermittent occipital ha- worse w/ uc's, occ seeing spots- both have been going on for about 1-1.5wks. Denies ruq/epigastric pain. Some n/v over the last week. Does have h/o pre-e. Had to be on bp meds x 3525yr after last delivery, then lost weight and was able to come off meds for last 403yrs.   Objective: BP 139/88 mmHg  Pulse 77  Temp(Src) 98.2 F (36.8 C) (Oral)  Resp 18  Ht 5\' 5"  (1.651 m)  Wt 99.451 kg (219 lb 4 oz)  BMI 36.49 kg/m2  SpO2 100%  LMP 08/07/2015 (Exact Date)   Only 1 severe-range bp, 157/107, majority have been 130-140s/90s   FHT:  FHR: 135 bpm, variability: moderate,  accelerations:  Present,  decelerations:  Absent UC:   Irregular, not tracing well, toco readjusted  SVE:   Dilation: 5.5 Effacement (%): 80 Station: Ballotable Exam by:: Joellyn HaffKim Jaylea Plourde CNM vtx, BBOW  Labs: Lab Results  Component Value Date   WBC 6.5 04/15/2016   HGB 10.1* 04/15/2016   HCT 31.8* 04/15/2016   MCV 80.7 04/15/2016   PLT 213 04/15/2016   Pre-e labs normal, P:C ratio 0.17  Assessment / Plan: early labor, CHTN- no meds during pregnancy, only 1 severe-range bp here, mild ha/occ seeing spots- will discuss w/ Dr. Vergie LivingPickens. Ordered Labetalol HTN orders for any further severe-range bp's.   Labor: Progressing normally Fetal Wellbeing:  Category I Pain Control:  Labor support without medications Pre-eclampsia: vs. Worsening CHTN I/D:  pcn for gbs+ Anticipated MOD:  NSVD  Marge DuncansBooker, Lula Kolton Randall CNM, WHNP-BC 04/15/2016, 2:17 PM    1440: Discussed w/ Dr. Vergie LivingPickens, d/t bp's and ha/seeing spots will dx w/ pre-e w/ severe features, and begin mag 4gm bolus/2gm hr, and augment if needed.  Cheral MarkerKimberly R. Merelyn Klump, CNM,  [facility] 04/15/2016 2:41 PM

## 2016-04-15 NOTE — MAU Note (Signed)
Contractions stronger since 5 pm Friday.  Vomiting started a week ago, was prescribed nausea med but it came back up.  Ate at 11 pm - lima beans and mashed potatoes and vomited 5 times since then.  Baby moving 'her normal, has slowed down over last week'.  No diarrhea. No bleeding. No leaking.

## 2016-04-15 NOTE — Anesthesia Pain Management Evaluation Note (Signed)
  CRNA Pain Management Visit Note  Patient: Christine Cervantes, 30 y.o., female  "Hello [facility]. We have an anesthesia team available at all times to provide care throughout the hospital, including epidural management and anesthesia for C-section. I don't know your plan for the delivery whether it a natural birth, water birth, IV sedation, nitrous supplementation, doula or epidural, but we want to meet your pain goals."   1.Was your pain managed to your expectations on prior hospitalizations?   Yes   2.What is your expectation for pain management during this hospitalization?     Epidural  3.How can we help you reach that goal? epidural  Record the patient's initial score and the patient's pain goal.   Pain: 9  Pain Goal: 10 [facility] wants you to be able to say your pain was always managed very well.  Christine Cervantes,Christine Cervantes 04/15/2016

## 2016-04-15 NOTE — H&P (Signed)
Christine Cervantes is a 30 y.o. female [phone] @[redacted]w[redacted]d  [facility] for n/v x 24 hours, h/a and spots in her vision x 1.5 weeks, and cramping/contractions felt mostly in her back x 2-3 days.  She reports her h/a is intermittent and similar to the h/a she had with her previous pregnancies at the end when her blood pressure was high.  She has not tried anything for her headache.  Rest makes the headache better.  Her cramping is irregular but uncomfortable. It is not improved with drinking water or resting. She has not tried medications.  She reports good fetal movement, denies LOF, vaginal bleeding, vaginal itching/burning, urinary symptoms, dizziness,  or fever/chills.     Clinic  [facility] Prenatal Labs  Dating LMP = 9 week [place] Blood type: O/POS/-- (02/22 1526)   Genetic Screen  quad: neg Antibody:NEG (02/22 1526)  Anatomic US Normal Rubella: 5.96 (02/22 1526)  GTT  Third trimester: 95 RPR: NON REAC (02/22 1526)   Flu vaccine  HBsAg: NEGATIVE (02/22 1526)   TDaP vaccine  02/10/16                            Rhogam: HIV: NONREACTIVE (02/22 1526)   Baby Food  breast                                      GBS: (For PCN allergy, check sensitivities) Positive  Contraception  Nexplanon Pap: negative 04/2015  Circumcision  n/a   Pediatrician    Support Person      Maternal Medical History:  Reason for admission: Contractions and nausea.   Contractions: Onset was 2 days ago.   Frequency: irregular.   Duration is approximately 1 minute.   Perceived severity is moderate.    Fetal activity: Perceived fetal activity is normal.   Last perceived fetal movement was within the past hour.    Prenatal complications: PIH.   Prenatal Complications - Diabetes: none.    OB History    Gravida Para Term Preterm AB TAB SAB Ectopic Multiple Living   6 3 2 1 2  2   3      Past Medical History  Diagnosis Date  . Chlamydia   . Trichimoniasis   . Gonorrhea   . Preterm labor   . Urinary tract infection    . Abnormal Pap smear     cryo on cervix  . Depression   . Headache(784.0)   . Anemia   . Pregnancy induced hypertension     Chronic hypertension  . Vaginal Pap smear, abnormal    Past Surgical History  Procedure Laterality Date  . Foot surgery  2004-2004    corns removed from both feet, hammertoes repaired  . Gynecologic cryosurgery     Family History: family history includes Diabetes in her paternal grandmother; Hypertension in her maternal aunt, maternal grandmother, and mother. There is no history of Anesthesia problems or Other. Social History:  reports that she has never smoked. She has never used smokeless tobacco. She reports that she drinks alcohol. She reports that she does not use illicit drugs.   Prenatal Transfer Tool  Maternal Diabetes: No Genetic Screening: Normal Maternal Ultrasounds/Referrals: Normal Fetal Ultrasounds or other Referrals:  None Maternal Substance Abuse:  No Significant Maternal Medications:  None Significant Maternal Lab Results:  Lab values include: Group B  Strep positive Other Comments:  None  Review of Systems  Constitutional: Negative for fever, chills and malaise/fatigue.  Eyes: Negative for blurred vision.  Respiratory: Negative for cough and shortness of breath.   Cardiovascular: Negative for chest pain.  Gastrointestinal: Positive for nausea, vomiting and abdominal pain. Negative for heartburn.  Genitourinary: Negative for dysuria, urgency and frequency.  Musculoskeletal: Negative.   Neurological: Positive for headaches. Negative for dizziness.  Psychiatric/Behavioral: Negative for depression.    Dilation: 5 Effacement (%): 70 Station: Ballotable Exam by:: L. Leftwich-Kirby CNM Blood pressure 134/100, pulse 99, temperature 98.5 F (36.9 C), temperature source Oral, resp. rate 20, height  (1.651 m), weight 99.451 kg (219 lb 4 oz), last menstrual period 08/07/2015, SpO2 98 %, unknown if currently breastfeeding. Maternal Exam:   Uterine Assessment: Contraction strength is mild.  Contraction duration is 50 seconds. Contraction frequency is irregular.   Abdomen: Fetal presentation: vertex     Fetal Exam Fetal Monitor Review: Mode: ultrasound.   Baseline rate: 135.  Variability: moderate (6-25 bpm).   Pattern: accelerations present and no decelerations.    Fetal State Assessment: Category I - tracings are normal.     Physical Exam  Nursing note and vitals reviewed. Constitutional: She is oriented to person, place, and time. She appears well-developed and well-nourished.  Neck: Normal range of motion.  Cardiovascular: Normal rate and regular rhythm.   Respiratory: Effort normal and breath sounds normal.  GI: Soft.  Musculoskeletal: Normal range of motion.  Neurological: She is alert and oriented to person, place, and time. She has normal reflexes.  Skin: Skin is warm and dry.  Psychiatric: She has a normal mood and affect. Her behavior is normal. Judgment and thought content normal.    Prenatal labs: ABO, Rh: O/POS/-- (02/22 1526) Antibody: NEG (02/22 1526) Rubella: 5.96 (02/22 1526) RPR: NON REAC (05/04 0934)  HBsAg: NEGATIVE (02/22 1526)  HIV: NONREACTIVE (05/04 0934)  GBS:   Positive  Assessment/Plan: 1. Preterm labor in third trimester without delivery   2. Chronic hypertension during pregnancy, antepartum   3. Headache in pregnancy, antepartum, third trimester     Admit to [facility] Expectant management for preterm labor PCN for GBS prophylaxis  LEFTWICH-KIRBY, Lorianne Malbrough, CNM 8:03 AM

## 2016-04-15 NOTE — Progress Notes (Signed)
Patient ID: Bettey Costamanda M Cozad, female   DOB: 1986/07/19, 30 y.o.   MRN: [id number] Bettey Costamanda M Ruelas is a 30 y.o. [id number] at 1958w0d admitted for early labor, CHTN- no meds w/ severe range bp's  Subjective: Still uncomfortable w/ uc's, not planning epidural. Intermittent ha, occ seeing spots earlier. Denies ruq/epigastric pain, n/v.     Objective: BP 145/86 mmHg  Pulse 92  Temp(Src) 98.1 F (36.7 C) (Oral)  Resp 18  Ht 5\' 5"  (1.651 m)  Wt 99.451 kg (219 lb 4 oz)  BMI 36.49 kg/m2  SpO2 96%  LMP 08/07/2015 (Exact Date) Total I/O In: 250 [P.O.:250] Out: 900 [Urine:900]  FHT:  FHR: 135 bpm, variability: moderate,  accelerations:  Present,  decelerations:  Absent UC:   Irregular, not tracing well  SVE:   Dilation: 5.5 Effacement (%): 70 Station: Ballotable Exam by:: Joellyn HaffKim Nilza Eaker, CNM  Labs: Lab Results  Component Value Date   WBC 6.5 04/15/2016   HGB 10.1* 04/15/2016   HCT 31.8* 04/15/2016   MCV 80.7 04/15/2016   PLT 213 04/15/2016    Assessment / Plan: admitted in early labor for ptl, chtn w/ severe range bp's. Cx hasn't really changed since last exam, will start pitocin per protocol. On Mag for chtn w/ superimposed severe pre-e based on sx and bp  Labor: early Fetal Wellbeing:  Category I Pain Control:  Labor support without medications Pre-eclampsia: CHTN w/ superimposed pre-e, bp's stable, on mag I/D:  pcn for gbs+ Anticipated MOD:  NSVD  Marge DuncansBooker, Audrionna Lampton Randall CNM, WHNP-BC 04/15/2016, 1950

## 2016-04-16 ENCOUNTER — Inpatient Hospital Stay (HOSPITAL_COMMUNITY): Payer: Medicaid Other | Admitting: Anesthesiology

## 2016-04-16 ENCOUNTER — Encounter (HOSPITAL_COMMUNITY): Payer: Self-pay

## 2016-04-16 DIAGNOSIS — Z3A36 36 weeks gestation of pregnancy: Secondary | ICD-10-CM

## 2016-04-16 DIAGNOSIS — O1092 Unspecified pre-existing hypertension complicating childbirth: Secondary | ICD-10-CM

## 2016-04-16 LAB — CBC
HCT: 31.6 % — ABNORMAL LOW (ref 36.0–46.0)
HEMOGLOBIN: 10.1 g/dL — AB (ref 12.0–15.0)
MCH: 26.2 pg (ref 26.0–34.0)
MCHC: 32 g/dL (ref 30.0–36.0)
MCV: 81.9 fL (ref 78.0–100.0)
Platelets: 214 10*3/uL (ref 150–400)
RBC: 3.86 MIL/uL — ABNORMAL LOW (ref 3.87–5.11)
RDW: 14.4 % (ref 11.5–15.5)
WBC: 10.8 10*3/uL — ABNORMAL HIGH (ref 4.0–10.5)

## 2016-04-16 MED ORDER — BUPIVACAINE HCL (PF) 0.25 % IJ SOLN
INTRAMUSCULAR | Status: DC | PRN
  Administered 2016-04-16 (×2): 4 mL via EPIDURAL

## 2016-04-16 MED ORDER — BENZOCAINE-MENTHOL 20-0.5 % EX AERO: 1.0000 "application " | INHALATION_SPRAY | CUTANEOUS | Status: DC | PRN

## 2016-04-16 MED ORDER — MEASLES, MUMPS & RUBELLA VAC ~~LOC~~ INJ
0.5000 mL | INJECTION | Freq: Once | SUBCUTANEOUS | Status: DC
  Filled 2016-04-16: qty 0.5

## 2016-04-16 MED ORDER — SENNOSIDES-DOCUSATE SODIUM 8.6-50 MG PO TABS
2.0000 | ORAL_TABLET | ORAL | Status: DC
  Administered 2016-04-16 – 2016-04-17 (×2): 2 via ORAL
  Filled 2016-04-16 (×2): qty 2

## 2016-04-16 MED ORDER — LIDOCAINE-EPINEPHRINE (PF) 2 %-1:200000 IJ SOLN
INTRAMUSCULAR | Status: DC | PRN
  Administered 2016-04-16: 4 mL

## 2016-04-16 MED ORDER — PRENATAL MULTIVITAMIN CH
1.0000 | ORAL_TABLET | Freq: Every day | ORAL | Status: DC
  Administered 2016-04-16 – 2016-04-17 (×2): 1 via ORAL
  Filled 2016-04-16 (×2): qty 1

## 2016-04-16 MED ORDER — COCONUT OIL OIL
1.0000 "application " | TOPICAL_OIL | Status: DC | PRN
  Administered 2016-04-16: 1 via TOPICAL
  Filled 2016-04-16: qty 120

## 2016-04-16 MED ORDER — ZOLPIDEM TARTRATE 5 MG PO TABS: 5.0000 mg | ORAL_TABLET | Freq: Every evening | ORAL | Status: DC | PRN

## 2016-04-16 MED ORDER — SIMETHICONE 80 MG PO CHEW: 80.0000 mg | CHEWABLE_TABLET | ORAL | Status: DC | PRN

## 2016-04-16 MED ORDER — ONDANSETRON HCL 4 MG/2ML IJ SOLN: 4.0000 mg | INTRAMUSCULAR | Status: DC | PRN

## 2016-04-16 MED ORDER — SODIUM CHLORIDE 0.9% FLUSH: 3.0000 mL | INTRAVENOUS | Status: DC | PRN

## 2016-04-16 MED ORDER — ONDANSETRON HCL 4 MG PO TABS: 4.0000 mg | ORAL_TABLET | ORAL | Status: DC | PRN

## 2016-04-16 MED ORDER — SODIUM CHLORIDE 0.9 % IV SOLN: 250.0000 mL | INTRAVENOUS | Status: DC | PRN

## 2016-04-16 MED ORDER — DIPHENHYDRAMINE HCL 25 MG PO CAPS: 25.0000 mg | ORAL_CAPSULE | Freq: Four times a day (QID) | ORAL | Status: DC | PRN

## 2016-04-16 MED ORDER — IBUPROFEN 600 MG PO TABS
600.0000 mg | ORAL_TABLET | Freq: Four times a day (QID) | ORAL | Status: DC
  Administered 2016-04-16 – 2016-04-18 (×8): 600 mg via ORAL
  Filled 2016-04-16 (×8): qty 1

## 2016-04-16 MED ORDER — SODIUM CHLORIDE 0.9% FLUSH
3.0000 mL | Freq: Two times a day (BID) | INTRAVENOUS | Status: DC
  Administered 2016-04-17: 3 mL via INTRAVENOUS

## 2016-04-16 MED ORDER — TETANUS-DIPHTH-ACELL PERTUSSIS 5-2.5-18.5 LF-MCG/0.5 IM SUSP
0.5000 mL | Freq: Once | INTRAMUSCULAR | Status: DC
  Filled 2016-04-16: qty 0.5

## 2016-04-16 MED ORDER — ACETAMINOPHEN 325 MG PO TABS: 650.0000 mg | ORAL_TABLET | ORAL | Status: DC | PRN

## 2016-04-16 NOTE — Progress Notes (Signed)
Patient pumping per request. Infant remains in nursery for blood sugar instability. Carmelina DaneERRI L Steve Gregg, RN

## 2016-04-16 NOTE — Anesthesia Procedure Notes (Signed)
Epidural Patient location during procedure: OB  Staffing Anesthesiologist: Tylan Kinn Performed by: anesthesiologist   Preanesthetic Checklist Completed: patient identified, surgical consent, pre-op evaluation, timeout performed, IV checked, risks and benefits discussed and monitors and equipment checked  Epidural Patient position: sitting Prep: DuraPrep Patient monitoring: heart rate, cardiac monitor, continuous pulse ox and blood pressure Approach: midline Location: L3-L4 Injection technique: LOR saline  Needle:  Needle type: Tuohy  Needle gauge: 17 G Needle length: 9 cm Needle insertion depth: 9 cm Catheter type: closed end flexible Catheter size: 19 Gauge Catheter at skin depth: 15 cm Test dose: negative and 2% lidocaine with Epi 1:200 K  Assessment Events: blood not aspirated, injection not painful, no injection resistance, negative IV test and no paresthesia  Additional Notes Reason for block:procedure for pain   

## 2016-04-16 NOTE — Progress Notes (Signed)
Pt oob and voiding without difficulty. No concerns noted at this time. Pt without complaints. Carmelina DaneERRI L Kendria Halberg, RN

## 2016-04-16 NOTE — Progress Notes (Addendum)
Patient ID: Christine Cervantes, female   DOB: 22-Oct-1985, [age] y.o.   MRN: [id number] Christine Cervantes is a 30 y.o. [id number] at 7760w1d admitted for early labor, CHTN (no meds) w/ superimposed pre-e  Subjective: Comfortable w/ epidural. Denies ha, visual changes, ruq/epigastric pain, n/v.    Objective: BP 150/90 mmHg  Pulse 96  Temp(Src) 98.7 F (37.1 C) (Oral)  Resp 16  Ht 5\' 5"  (1.651 m)  Wt 99.451 kg (219 lb 4 oz)  BMI 36.49 kg/m2  SpO2 97%  LMP 08/07/2015 (Exact Date)    FHT:  FHR: 125 bpm, variability: moderate,  accelerations:  Present,  decelerations:  Absent UC:   q 3-5 mins  SVE:  6/70/-2, vtx, bbow, vtx -1 after arom AROM sm amt clear fluid  Pitocin @ 16 mu/min  Labs: Lab Results  Component Value Date   WBC 10.8* 04/16/2016   HGB 10.1* 04/16/2016   HCT 31.6* 04/16/2016   MCV 81.9 04/16/2016   PLT 214 04/16/2016    Assessment / Plan: Admitted for early labor, and CHTN (no meds) w/ severe range bp's w/ ha and seeing spots. Labor stalled, so pitocin was begun @ 2030 last night. Became uncomfortable enough to get an epidural. Vtx finally low enought to arom. Increse pitocin per protocol to acheive adequate labor/cervical change. Will receive 2nd dose of bmz at 0900.   Labor: Progressing normally Fetal Wellbeing:  Category I Pain Control:  Epidural Pre-eclampsia: mag, bp's stable I/D:  pcn for gbs+ Anticipated MOD:  NSVD  Christine Cervantes, Christine Cervantes CNM, WHNP-BC 04/16/2016, 0800

## 2016-04-16 NOTE — Lactation Note (Signed)
This note was copied from a baby's chart. Lactation Consultation Note Initial visit at 8 hours of age.  Baby is 9332w1d and 5#10oz.  RN has set up DEBP and given baby 7mls of colostrum by syringe at last feeding in the nursery.  Baby had initial low glucose and also had formula in a bottle.  LC discussed LPT policy pumping plan, supplementing and normal behavior.  Mom has limited experience with older children breast feeding.  Mom wants baby to get "early milk" and is concerned about transition back to work.  Mom does not currently have WIC, but plans to call them soon.  Encouraged mom to focus on pumping now while baby is being cared for in nursery and discussed LC will follow up with mom possibly tomorrow.  [facility] given and discussed.  Encouraged to feed with early cues on demand.  Early newborn behavior discussed.  Hand expression demonstrated with colostrum visible.  Mom to call for assist as needed.  Plan is for mom to pump every 2-3 hours while baby is in nursery and follow LPT policy when baby returns to mom.  LC reported to AICU, Rn.      Patient Name: Christine Cervantes Reason for consult: Initial assessment;Infant < 6lbs;Late preterm infant   Maternal Data Has patient been taught Hand Expression?: Yes Does the patient have breastfeeding experience prior to this delivery?: Yes  Feeding Feeding Type: Breast Milk  LATCH Score/Interventions Latch:  (baby is in nursery)                    Lactation Tools Discussed/Used WIC Program: No (plnas to call discussed possible pump rental if needed) Pump Review: Setup, frequency, and cleaning Initiated by:: RN Date initiated:: 04/16/16   Consult Status Consult Status: Follow-up Date: 04/17/16 Follow-up type: In-patient    Christine Cervantes, Arvella MerlesJana Lynn Cervantes, 5:19 PM

## 2016-04-16 NOTE — Anesthesia Preprocedure Evaluation (Signed)
Anesthesia Evaluation  Patient identified by MRN, date of birth, ID band Patient awake    Reviewed: Allergy & Precautions, Patient's Chart, lab work & pertinent test results  History of Anesthesia Complications Negative for: history of anesthetic complications  Airway Mallampati: III  TM Distance: >3 FB Neck ROM: Full    Dental  (+) Teeth Intact   Pulmonary neg pulmonary ROS,    breath sounds clear to auscultation       Cardiovascular hypertension,  Rhythm:Regular     Neuro/Psych  Headaches, PSYCHIATRIC DISORDERS Depression    GI/Hepatic negative GI ROS, Neg liver ROS,   Endo/Other  negative endocrine ROS  Renal/GU negative Renal ROS     Musculoskeletal   Abdominal   Peds  Hematology  (+) anemia ,   Anesthesia Other Findings   Reproductive/Obstetrics (+) Pregnancy                             Anesthesia Physical Anesthesia Plan  ASA: II  Anesthesia Plan: Epidural   Post-op Pain Management:    Induction:   Airway Management Planned:   Additional Equipment:   Intra-op Plan:   Post-operative Plan:   Informed Consent: I have reviewed the patients History and Physical, chart, labs and discussed the procedure including the risks, benefits and alternatives for the proposed anesthesia with the patient or authorized representative who has indicated his/her understanding and acceptance.   Dental advisory given  Plan Discussed with: Anesthesiologist  Anesthesia Plan Comments:         Anesthesia Quick Evaluation

## 2016-04-16 NOTE — Anesthesia Postprocedure Evaluation (Signed)
Anesthesia Post Note  Patient: Christine Cervantes  Procedure(s) Performed: * No procedures listed *  Patient location during evaluation: A-ICU Anesthesia Type: Epidural Level of consciousness: awake and alert Pain management: pain level controlled Vital Signs Assessment: post-procedure vital signs reviewed and stable Respiratory status: spontaneous breathing, nonlabored ventilation and respiratory function stable Cardiovascular status: stable Postop Assessment: no headache, no backache, epidural receding and no signs of nausea or vomiting Anesthetic complications: no     Last Vitals:  Filed Vitals:   04/16/16 1600 04/16/16 1700  BP: 146/98   Pulse: 94   Temp: 37 C   Resp: 18 16    Last Pain:  Filed Vitals:   04/16/16 1716  PainSc: 3    Pain Goal: Patients Stated Pain Goal: 0 (04/16/16 1250)               Rica RecordsICKELTON,Alvira Hecht

## 2016-04-17 NOTE — Progress Notes (Signed)
UR chart review completed.  

## 2016-04-17 NOTE — Progress Notes (Signed)
Patient transferring to [address].  Family updated and at bedside.  Room is not prepared at this time.

## 2016-04-17 NOTE — Progress Notes (Signed)
Post Partum Day 1 Subjective: no complaints, up ad lib, voiding and tolerating PO  Objective: Blood pressure 138/96, pulse 97, temperature 98.1 F (36.7 C), temperature source Oral, resp. rate 18, height 5\' 5"  (1.651 m), weight 219 lb 4 oz (99.451 kg), last menstrual period 08/07/2015, SpO2 99 %, unknown if currently breastfeeding.  Filed Vitals:   04/16/16 2345 04/17/16 0120 04/17/16 0320 04/17/16 0514  BP: 149/97 144/101 136/90 138/96  Pulse: 91  90 97  Temp: 97.9 F (36.6 C)   98.1 F (36.7 C)  TempSrc: Oral   Oral  Resp: 20  18 18   Height:      Weight:      SpO2: 99%  98% 99%    Physical Exam:  General: alert, cooperative and no distress Lochia: appropriate Uterine Fundus: firm Incision:  DVT Evaluation: No evidence of DVT seen on physical exam.   Recent Labs  04/15/16 0500 04/16/16 0440  HGB 10.1* 10.1*  HCT 31.8* 31.6*    Assessment/Plan: Plan for discharge tomorrow and Breastfeeding   Stop magnesium this am, transfer to floor No BP meds right now, will see what BP does off mag   LOS: 2 days   Sipriano Fendley H 04/17/2016, 7:59 AM

## 2016-04-17 NOTE — Lactation Note (Signed)
This note was copied from a baby's chart. Lactation Consultation Note  Patient Name: Girl Georgia Lopesmanda Salberg GMWNU'UToday's Date: 04/17/2016 Reason for consult: Follow-up assessment;Late preterm infant LPI 28 hours old. Mom reports that she is putting the baby to breast first and then offering formula as supplement afterwards. Mom reports that she is just offering the baby colostrum here at the beginning of breastfeeding. Mom states that she understands supply and demand, but she is not interested in BF for long. Reviewed LPI behavior and guidelines. Mom enc to call for assistance as needed.   Maternal Data    Feeding Feeding Type: Formula Nipple Type: Slow - flow Length of feed: 5 min  LATCH Score/Interventions                      Lactation Tools Discussed/Used     Consult Status Consult Status: PRN    Geralynn OchsWILLIARD, Chanae Gemma 04/17/2016, 1:51 PM

## 2016-04-17 NOTE — Progress Notes (Signed)
Pt had dime size amount of blood in sputum. Md aware. No orders received. MD aware of BP of 152/72 decreased to 141/89

## 2016-04-17 NOTE — Progress Notes (Signed)
Post Partum Day 1  Subjective:  Christine Cervantes is a 30 y.o. [phone] 6827w1d s/p SVD who is in our ICU 2/2 Mag administration for SIseverePE.  No acute events overnight.  Pt denies problems with ambulating, voiding or po intake.  She denies nausea or vomiting.  Pain is well controlled.  She has had flatus. She has not had bowel movement.  Lochia Moderate.  Plan for birth control is nexplanon.  Method of Feeding: both  Objective: BP 138/96 mmHg  Pulse 97  Temp(Src) 98.1 F (36.7 C) (Oral)  Resp 18  Ht 5\' 5"  (1.651 m)  Wt 99.451 kg (219 lb 4 oz)  BMI 36.49 kg/m2  SpO2 99%  LMP 08/07/2015 (Exact Date)  Breastfeeding? Unknown  Physical Exam:  General: alert, cooperative and no distress Lochia:normal flow Chest: CTAB Heart: RRR no m/r/g Abdomen: +BS, soft, nontender, fundus firm at/below umbilicus Uterine Fundus: firm, nontender DVT Evaluation: No evidence of DVT seen on physical exam. Extremities: 2+ edema up to bilateral knees   Recent Labs  04/15/16 0500 04/16/16 0440  HGB 10.1* 10.1*  HCT 31.8* 31.6*    Assessment/Plan:  ASSESSMENT: Christine Costamanda M Seawood is a 30 y.o. [id number] 2227w1d ppd #1 s/p NSVD doing well.   SIseverePE: 24 hours of Mag to end at 0912 today. Denies HA or scotoma. BPs running 138-153/91-105. Has not required any labetalol PRN. Endorses HTN in prior pregnancies.  Plan for discharge tomorrow and Breastfeeding. Nexplanon for contraception.   LOS: 2 days   Loni MuseKate Simmone Cape 04/17/2016, 7:55 AM

## 2016-04-17 NOTE — Progress Notes (Signed)
Second attempt for report, room is not ready.

## 2016-04-18 ENCOUNTER — Other Ambulatory Visit: Payer: Medicaid Other

## 2016-04-18 ENCOUNTER — Other Ambulatory Visit: Payer: Medicaid Other | Admitting: Advanced Practice Midwife

## 2016-04-18 MED ORDER — PRENATAL MULTIVITAMIN CH: 1.0000 | ORAL_TABLET | Freq: Every day | ORAL | Status: DC

## 2016-04-18 MED ORDER — SENNOSIDES-DOCUSATE SODIUM 8.6-50 MG PO TABS: 1.0000 | ORAL_TABLET | Freq: Every day | ORAL | Status: DC

## 2016-04-18 MED ORDER — ACETAMINOPHEN 325 MG PO TABS: 650.0000 mg | ORAL_TABLET | ORAL | Status: DC | PRN

## 2016-04-18 MED ORDER — IBUPROFEN 600 MG PO TABS: 600.0000 mg | ORAL_TABLET | Freq: Four times a day (QID) | ORAL | Status: DC | PRN

## 2016-04-18 NOTE — Progress Notes (Signed)
CSW acknowledged consult for MOB for hx of depression.  MOB was engaging, interested, and concerned about her baby's medical needs.  MOB introduced her room guest as FOB Lauro Regulus(Dominque Morgan), and she gave CSW permission to speak with MOB with while FOB was present. MOB reported she was dx with depression in 2009, and was prescribed medication for about 30 days (medication in unknown). MOB denies any other medication or behavioral health counseling for her mental health. MOB also denied any hx of PPD with her other 3 children. CSW offered MOB resources for MH and MOB declined.   MOB stated she knows where to go if help is needed. MOB had no questions or concerns at this time.   FOB was present but did not engage with CSW.  MOB expressed concerns about the infant possibly not being able to be discharged with MOB on this day.  CSW encouraged MOB to consult with peds.

## 2016-04-18 NOTE — Lactation Note (Signed)
This note was copied from a baby's chart. Lactation Consultation Note  MOB reports that BF is going well and that she always BF before she gives any formula to the baby.  Baby was re-weighed and is gaining well.  SHe denied any questions for the lactation consultant.  Patient Name: Girl Georgia Lopesmanda Duve ZOXWR'UToday's Date: 04/18/2016 Reason for consult: Follow-up assessment   Maternal Data    Feeding Feeding Type: Breast Fed Length of feed: 15 min  LATCH Score/Interventions                      Lactation Tools Discussed/Used     Consult Status      Soyla DryerJoseph, Batoul Limes 04/18/2016, 10:20 AM

## 2016-04-18 NOTE — Discharge Instructions (Signed)

## 2016-04-18 NOTE — Discharge Summary (Signed)
OB Discharge Summary     Patient Name: Christine Cervantes DOB: April 22, 1986 MRN: [id number]  Date of admission: 04/15/2016 Delivering MD: Zerita BoersLAWSON, DARLENE   Date of discharge: 04/18/2016  Admitting diagnosis: vomiting and back contractions Intrauterine pregnancy: 4216w1d     Secondary diagnosis:  Active Problems:   Chronic hypertension in pregnancy Preterm Labor  Chronic HTN SI severe pre-eclampsia  Additional problems:    Preterm Labor  Chronic HTN SI severe pre-eclampsia   Discharge diagnosis: Preterm Pregnancy Delivered                                                                                                Post partum procedures:received Magnesium for 24 hours postpartum due to severe pre-eclampsia  Augmentation: AROM and Pitocin  Complications: None  Hospital course:  Onset of Labor With Vaginal Delivery     30 y.o. yo [id number] at 416w1d was admitted in Latent Labor on 04/15/2016. She was also found to have chronic HTN superimposed with Pre-eclampsia based on symptoms and blood presssure. Patient was started on magnesium.  Patient had a labor course as follows:  Membrane Rupture Time/Date: 8:00 AM ,04/16/2016   Intrapartum Procedures: Episiotomy: None [1]                                         Lacerations:  None [1]   Labor stalled, so Pitocin was started to augment labor.  Patient had a delivery of a Viable infant. 04/16/2016  Information for the patient's newborn:  Maye HidesDavis, Girl Delane [[id number]][[id number]]  Delivery Method: Vaginal, Spontaneous Delivery (Filed from Delivery Summary)     Due to her chronic HTN superimposed pre-eclampsia, she was continued on Magnesium for 24 hours after delivery. She is ambulating, tolerating a regular diet, passing flatus, and urinating well. Her blood pressure was in the 130s-140s/ 79-93. Baby Love was also ordered for her for blood pressure check. Patient was asymptomatic. Patient is discharged home in stable condition on 04/18/2016.  Of note, the  night prior to discharge, patient reported of sputum tinged with blood that was dime sized x2. Reported of nasal congestion and chest congestion and post-nasal drip. No shortness of breath or wheezing. On exam she had dried blood in left nare and her OP was unremarkable although had limited view of pharynx. Sputum with blood did not recurr. Patient also had medium sized clot at 11pm on 7/10 after sitting for prolonged time. No other clots after this. Her lochia had slowed down and was appropriate upon discharge.     Physical exam  Filed Vitals:   04/17/16 2015 04/17/16 2235 04/17/16 2325 04/18/16 0345  BP: 141/89 150/92 143/79 140/89  Pulse: 82 98 76 75  Temp: 98.2 F (36.8 C) 98.3 F (36.8 C)    TempSrc:  Oral    Resp: 18 18  18   Height:      Weight:      SpO2:       General: alert, cooperative and no distress Lochia: appropriate Uterine  Fundus: firm Incision: n/a DVT Evaluation: No evidence of DVT seen on physical exam. Labs: Lab Results  Component Value Date   WBC 10.8* 04/16/2016   HGB 10.1* 04/16/2016   HCT 31.6* 04/16/2016   MCV 81.9 04/16/2016   PLT 214 04/16/2016   CMP Latest Ref Rng 04/15/2016  Glucose 65 - 99 mg/dL 960(A)  BUN 6 - 20 mg/dL <5(W)  Creatinine 0.98 - 1.00 mg/dL 1.19  Sodium 147 - 829 mmol/L 133(L)  Potassium 3.5 - 5.1 mmol/L 3.8  Chloride 101 - 111 mmol/L 105  CO2 22 - 32 mmol/L 20(L)  Calcium 8.9 - 10.3 mg/dL 8.2(L)  Total Protein 6.5 - 8.1 g/dL 6.1(L)  Total Bilirubin 0.3 - 1.2 mg/dL 0.4  Alkaline Phos 38 - 126 U/L 214(H)  AST 15 - 41 U/L 13(L)  ALT 14 - 54 U/L 7(L)    Discharge instruction: per After Visit Summary and "Baby and Me Booklet".  After visit meds:    Medication List    STOP taking these medications        aspirin EC 81 MG tablet      TAKE these medications        acetaminophen 325 MG tablet  Commonly known as:  TYLENOL  Take 2 tablets (650 mg total) by mouth every 4 (four) hours as needed for moderate pain.      ibuprofen 600 MG tablet  Commonly known as:  ADVIL,MOTRIN  Take 1 tablet (600 mg total) by mouth every 6 (six) hours as needed for cramping.     prenatal multivitamin Tabs tablet  Take 1 tablet by mouth daily at 12 noon.     senna-docusate 8.6-50 MG tablet  Commonly known as:  Senokot-S  Take 1 tablet by mouth at bedtime.        Diet: routine diet  Activity: Advance as tolerated. Pelvic rest for 6 weeks.   Outpatient follow up:6 weeks Follow up Appt: Future Appointments Date Time Provider Department Center  04/25/2016 2:15 PM [facility] [place] 4 [facility]   Follow up Visit:No Follow-up on file.  Postpartum contraception: Depo Provera  Newborn Data: Live born female  Birth Weight: 5 lb 10.7 oz (2571 g) APGAR: 9,   Baby Feeding: Bottle and Breast Disposition:home with mother   04/18/2016 Palma Holter, MD

## 2016-04-18 NOTE — Progress Notes (Signed)
Late Entry:  Nursing called to report of blood in sputum. Went to evaluate patient.   Reports she has had some chest and nasal congestion since yesterday. NO sob or chest pain. Reports she had dime sized blood in sputum x 2 which soon resolved and her sputum was clear. She also has post nasal drip and reports had minimal blood when she blew her nose.  No personal history or family hx of blood disorders. No hx of TB exposure; had skin test which was negative recently for work.   On exam Gen: NAD, sitting up in bed, pleasant  Mouth: OP unable to clearly visualize even with tongue depressor but no petechiae noted on palate.  Nose: dried blood noted in left nare, normal right nare Lung: CTAB, no increased wob Cardiac: RRR, no m/r/g  A/P: Possibly in the setting of viral URI with cough. With dried blood in nose.  - will closely monitor for now

## 2016-04-21 ENCOUNTER — Other Ambulatory Visit: Payer: Medicaid Other | Admitting: Family Medicine

## 2016-04-25 ENCOUNTER — Ambulatory Visit (HOSPITAL_COMMUNITY): Payer: Medicaid Other

## 2016-04-27 ENCOUNTER — Encounter (HOSPITAL_COMMUNITY): Payer: Self-pay | Admitting: *Deleted

## 2016-05-26 ENCOUNTER — Encounter: Payer: Self-pay | Admitting: *Deleted

## 2016-06-02 ENCOUNTER — Encounter: Payer: Self-pay | Admitting: Family Medicine

## 2016-06-02 ENCOUNTER — Ambulatory Visit (INDEPENDENT_AMBULATORY_CARE_PROVIDER_SITE_OTHER): Payer: Medicaid Other | Admitting: Family Medicine

## 2016-06-02 ENCOUNTER — Ambulatory Visit: Payer: Medicaid Other | Admitting: Family Medicine

## 2016-06-02 VITALS — BP 153/108 | HR 78

## 2016-06-02 DIAGNOSIS — O165 Unspecified maternal hypertension, complicating the puerperium: Secondary | ICD-10-CM

## 2016-06-02 DIAGNOSIS — R030 Elevated blood-pressure reading, without diagnosis of hypertension: Secondary | ICD-10-CM

## 2016-06-02 DIAGNOSIS — IMO0001 Reserved for inherently not codable concepts without codable children: Secondary | ICD-10-CM

## 2016-06-02 LAB — POCT URINALYSIS DIPSTICK
Bilirubin, UA: NEGATIVE
Blood, UA: NEGATIVE
Glucose, UA: NEGATIVE
KETONES UA: NEGATIVE
LEUKOCYTES UA: NEGATIVE
Nitrite, UA: NEGATIVE
PROTEIN UA: NEGATIVE
Spec Grav, UA: 1.01
Urobilinogen, UA: 0.2
pH, UA: 7

## 2016-06-02 LAB — CBC WITH DIFFERENTIAL/PLATELET
BASOS ABS: 0 {cells}/uL (ref 0–200)
Basophils Relative: 0 %
EOS ABS: 100 {cells}/uL (ref 15–500)
EOS PCT: 2 %
HCT: 35.9 % (ref 35.0–45.0)
HEMOGLOBIN: 11.3 g/dL — AB (ref 11.7–15.5)
LYMPHS ABS: 2300 {cells}/uL (ref 850–3900)
Lymphocytes Relative: 46 %
MCH: 25 pg — AB (ref 27.0–33.0)
MCHC: 31.5 g/dL — ABNORMAL LOW (ref 32.0–36.0)
MCV: 79.4 fL — ABNORMAL LOW (ref 80.0–100.0)
MONO ABS: 350 {cells}/uL (ref 200–950)
MPV: 10.3 fL (ref 7.5–12.5)
Monocytes Relative: 7 %
NEUTROS ABS: 2250 {cells}/uL (ref 1500–7800)
Neutrophils Relative %: 45 %
Platelets: 269 10*3/uL (ref 140–400)
RBC: 4.52 MIL/uL (ref 3.80–5.10)
RDW: 15 % (ref 11.0–15.0)
WBC: 5 10*3/uL (ref 3.8–10.8)

## 2016-06-02 NOTE — Progress Notes (Signed)
   Patient had appointment in nurse clinic for blood pressure check.  Pt's BP 153/110, HR 78.  Patient reported that she is having blurred vision and ankle swelling.  Symptoms started after giving birth. Pt has not had her postpartum visit.  Patient to see PCP.  Clovis PuMartin, Srihith Aquilino L, RN

## 2016-06-02 NOTE — Patient Instructions (Signed)
Try to take your BP at work or at [employer] this weekend. If you note it is much higher than it was her, go to the MAU at the [facility] If you note change in vision, worsening swelling, new headaches, or right upper quadrant pain please seek care. Follow up with [place] on Monday for a nursing appt for a BP check,  Preeclampsia and Eclampsia Preeclampsia is a serious condition that develops only during pregnancy. It is also called toxemia of pregnancy. This condition causes high blood pressure along with other symptoms, such as swelling and headaches. These may develop as the condition gets worse. Preeclampsia may occur 20 weeks or later into your pregnancy.  Diagnosing and treating preeclampsia early is very important. If not treated early, it can cause serious problems for you and your baby. One problem it can lead to is eclampsia, which is a condition that causes muscle jerking or shaking (convulsions) in the mother. Delivering your baby is the best treatment for preeclampsia or eclampsia.  RISK FACTORS The cause of preeclampsia is not known. You may be more likely to develop preeclampsia if you have certain risk factors. These include:   Being pregnant for the first time.  Having preeclampsia in a past pregnancy.  Having a family history of preeclampsia.  Having high blood pressure.  Being pregnant with twins or triplets.  Being 45 or older.  Being African American.  Having kidney disease or diabetes.  Having medical conditions such as lupus or blood diseases.  Being very overweight (obese). SIGNS AND SYMPTOMS  The earliest signs of preeclampsia are:  High blood pressure.  Increased protein in your urine. Your health care provider will check for this at every prenatal visit. Other symptoms that can develop include:   Severe headaches.  Sudden weight gain.  Swelling of your hands, face, legs, and feet.  Feeling sick to your stomach (nauseous) and throwing up  (vomiting).  Vision problems (blurred or double vision).  Numbness in your face, arms, legs, and feet.  Dizziness.  Slurred speech.  Sensitivity to bright lights.  Abdominal pain. DIAGNOSIS  There are no screening tests for preeclampsia. Your health care provider will ask you about symptoms and check for signs of preeclampsia during your prenatal visits. You may also have tests, including:  Urine testing.  Blood testing.  Checking your baby's heart rate.  Checking the health of your baby and your placenta using images created with sound waves (ultrasound). TREATMENT  You can work out the best treatment approach together with your health care provider. It is very important to keep all prenatal appointments. If you have an increased risk of preeclampsia, you may need more frequent prenatal exams.  Your health care provider may prescribe bed rest.  You may have to eat as little salt as possible.  You may need to take medicine to lower your blood pressure if the condition does not respond to more conservative measures.  You may need to stay in the hospital if your condition is severe. There, treatment will focus on controlling your blood pressure and fluid retention. You may also need to take medicine to prevent seizures.  If the condition gets worse, your baby may need to be delivered early to protect you and the baby. You may have your labor started with medicine (be induced), or you may have a cesarean delivery.  Preeclampsia usually goes away after the baby is born. HOME CARE INSTRUCTIONS   Only take over-the-counter or prescription medicines as directed  by your health care provider.  Lie on your left side while resting. This keeps pressure off your baby.  Elevate your feet while resting.  Get regular exercise. Ask your health care provider what type of exercise is safe for you.  Avoid caffeine and alcohol.  Do not smoke.  Drink 6-8 glasses of water every  day.  Eat a balanced diet that is low in salt. Do not add salt to your food.  Avoid stressful situations as much as possible.  Get plenty of rest and sleep.  Keep all prenatal appointments and tests as scheduled. SEEK MEDICAL CARE IF:  You are gaining more weight than expected.  You have any headaches, abdominal pain, or nausea.  You are bruising more than usual.  You feel dizzy or light-headed. SEEK IMMEDIATE MEDICAL CARE IF:   You develop sudden or severe swelling anywhere in your body. This usually happens in the legs.  You gain 5 lb (2.3 kg) or more in a week.  You have a severe headache, dizziness, problems with your vision, or confusion.  You have severe abdominal pain.  You have lasting nausea or vomiting.  You have a seizure.  You have trouble moving any part of your body.  You develop numbness in your body.  You have trouble speaking.  You have any abnormal bleeding.  You develop a stiff neck.  You pass out. MAKE SURE YOU:   Understand these instructions.  Will watch your condition.  Will get help right away if you are not doing well or get worse.   This information is not intended to replace advice given to you by your health care provider. Make sure you discuss any questions you have with your health care provider.   Document Released: 09/22/2000 Document Revised: 09/30/2013 Document Reviewed: 07/18/2013 Elsevier Interactive Patient Education [employer]2016 [employer]vier Inc.

## 2016-06-02 NOTE — Assessment & Plan Note (Signed)
The patient is presenting with elevated blood pressures, she is 6 weeks postpartum. She has a diagnosis of chronic hypertension, but was never on any medication. She has not been on any medication for her blood pressure prenatally or postpartum.  From reviewing the EMR, it appears her blood pressure was normal prior to discharge after her delivery. The patient is having blurred vision, but no other symptoms. I feel her swelling is most likely secondary to gravity, as it is very minimal and there is no facial swelling. -Discuss[facility] with Dr. [facility]-We'll obtain a urinalysis, protein to creatinine ratio, CMP, and CBC with differential -Advised the patient to check her blood pressures regularly this weekend. -Discussed strict return precautions to the patient such as new onset headache, change in blurred vision, scotomas, worsening lower extremity edema, right upper quadrant pain. The patient voiced understanding. -Advised patient to follow-up for a nurse's clinic appointment on Monday for a repeat blood pressure.

## 2016-06-02 NOTE — Progress Notes (Cosign Needed)
Subjective: CC: elevated BP HPI: Patient is a 30 y.o. female with a past medical history of chronic HTN with superimposed severe pre=E who delivered on 7/9 at 4765w2d on magnesium  presenting to clinic today for a BP check found to have an elevated BP. The patient has not been able to have a postpartum visit with the high risk clinic.  Baby Love was supposed to follow her postpartum, however they could not coordinate visits. They advised her to have her PCP to check her BP as they were concerned she gone 6 weeks without a blood pressure check.  She's had intermittent blurred vision weeks prior to delivery (initially her BPs were normal with this blurred vision but then it became elevated). Blurred vision is stable since that time. 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 No headaches now (she had it prior to delivery). No RUQ pain. Normal urination. No change in BMs.   Social History: never smoker ROS: All other systems reviewed and are negative.  Past Medical History Patient Active Problem List   Diagnosis Date Noted  . SVD (spontaneous vaginal delivery) 04/18/2016  . Hypertension, postpartum condition or complication 04/15/2016  . Positive GBS test 04/13/2016  . Supervision of high risk pregnancy, antepartum 02/25/2016  . Chronic hypertension during pregnancy, antepartum 02/10/2016  . History of preterm delivery, currently pregnant in second trimester 12/28/2015  . Anemia, iron deficiency 02/25/2013  . Essential hypertension 02/17/2013  . Depression 07/05/2009  . OBESITY 03/04/2008    Medications- reviewed and updated  Objective: Office vital signs reviewed. BP (!) 153/108   Pulse 78    Physical Examination:  General: Awake, alert, well- nourished, NAD ENMT:  TMs intact, normal light reflex, no erythema, no bulging. Nasal turbinates moist. MMM, Oropharynx clear without erythema  or tonsillar exudate/hypertrophy Eyes: Conjunctiva non-injected. PERRL. Fundoscopic exam normal.   Cardio: RRR, no m/r/g noted.  Pulm: No increased WOB.  CTAB, without wheezes, rhonchi or crackles noted.  GI: soft, NT/ND,+BS x4, no hepatomegaly, no splenomegaly Extremities: trace pitting edema bilaterally  Patellar and achilles DTRs 2/4  Assessment/Plan: Hypertension, postpartum condition or complication The patient is presenting with elevated blood pressures, she is 6 weeks postpartum. She has a diagnosis of chronic hypertension, but was never on any medication. She has not been on any medication for her blood pressure prenatally or postpartum.  From reviewing the EMR, it appears her blood pressure was normal prior to discharge after her delivery. The patient is having blurred vision, but no other symptoms. I feel her swelling is most likely secondary to gravity, as it is very minimal and there is no facial swelling. -Discussed with Dr. Jennette KettleNeal [facility]-We'll obtain a urinalysis, protein to creatinine ratio, CMP, and CBC with differential -Advised the patient to check her blood pressures regularly this weekend. -Discussed strict return precautions to the patient such as new onset headache, change in blurred vision, scotomas, worsening lower extremity edema, right upper quadrant pain. The patient voiced understanding. -Advised patient to follow-up for a nurse's clinic appointment on Monday for a repeat blood pressure.   Orders Placed This Encounter  Procedures  . CBC with Differential/Platelet  . COMPLETE METABOLIC PANEL WITH GFR  . Protein / creatinine ratio, urine  . POCT urinalysis dipstick    No orders of the defined types were placed in this encounter.   Joanna Puffrystal S. Dorsey PGY-3, [facility]

## 2016-06-03 LAB — COMPLETE METABOLIC PANEL WITHOUT GFR
ALT: 15 U/L (ref 6–29)
AST: 14 U/L (ref 10–30)
Albumin: 4.1 g/dL (ref 3.6–5.1)
Alkaline Phosphatase: 61 U/L (ref 33–115)
BUN: 7 mg/dL (ref 7–25)
CO2: 22 mmol/L (ref 20–31)
Calcium: 8.6 mg/dL (ref 8.6–10.2)
Chloride: 105 mmol/L (ref 98–110)
Creat: 0.73 mg/dL (ref 0.50–1.10)
GFR, Est African American: >89 mL/min
GFR, Est Non African American: >89 mL/min
Glucose, Bld: 77 mg/dL (ref 65–99)
Potassium: 3.7 mmol/L (ref 3.5–5.3)
Sodium: 140 mmol/L (ref 135–146)
Total Bilirubin: 0.4 mg/dL (ref 0.2–1.2)
Total Protein: 6.9 g/dL (ref 6.1–8.1)

## 2016-06-03 LAB — PROTEIN / CREATININE RATIO, URINE
Creatinine, Urine: 41 mg/dL (ref 20–320)
Total Protein, Urine: <4 mg/dL — ABNORMAL LOW (ref 5–24)

## 2016-06-14 ENCOUNTER — Ambulatory Visit: Payer: Medicaid Other | Admitting: Family Medicine

## 2016-06-15 ENCOUNTER — Ambulatory Visit: Payer: Medicaid Other | Admitting: Obstetrics and Gynecology

## 2016-08-17 ENCOUNTER — Ambulatory Visit: Payer: Self-pay | Admitting: Internal Medicine

## 2016-08-22 ENCOUNTER — Ambulatory Visit: Payer: Self-pay | Admitting: Family Medicine

## 2016-08-22 ENCOUNTER — Ambulatory Visit: Payer: Medicaid Other | Admitting: Family Medicine

## 2016-11-13 ENCOUNTER — Ambulatory Visit: Payer: Self-pay | Admitting: Internal Medicine

## 2016-11-23 ENCOUNTER — Ambulatory Visit: Payer: Self-pay | Admitting: Family Medicine

## 2016-12-05 ENCOUNTER — Ambulatory Visit: Payer: Self-pay | Admitting: Family Medicine

## 2016-12-14 ENCOUNTER — Ambulatory Visit: Payer: Self-pay | Admitting: Family Medicine

## 2017-01-30 ENCOUNTER — Ambulatory Visit (INDEPENDENT_AMBULATORY_CARE_PROVIDER_SITE_OTHER): Payer: Self-pay | Admitting: *Deleted

## 2017-01-30 DIAGNOSIS — Z23 Encounter for immunization: Secondary | ICD-10-CM

## 2017-01-30 DIAGNOSIS — Z111 Encounter for screening for respiratory tuberculosis: Secondary | ICD-10-CM

## 2017-01-30 NOTE — Progress Notes (Signed)
Tuberculin skin test applied to left ventral forearm. Explained how to read the test, measuring induration not just erythema; she will come into office in 48-72 hours to have test read. 

## 2017-02-01 ENCOUNTER — Encounter: Payer: Self-pay | Admitting: *Deleted

## 2017-02-01 ENCOUNTER — Ambulatory Visit (INDEPENDENT_AMBULATORY_CARE_PROVIDER_SITE_OTHER): Payer: Self-pay | Admitting: *Deleted

## 2017-02-01 DIAGNOSIS — Z111 Encounter for screening for respiratory tuberculosis: Secondary | ICD-10-CM

## 2017-02-01 LAB — TB SKIN TEST
Induration: 0 mm
TB SKIN TEST: NEGATIVE

## 2017-02-01 NOTE — Progress Notes (Signed)
   PPD Reading Note PPD read and results entered in EpicCare. Result: 0 mm induration. Interpretation: Negative If test not read within 48-72 hours of initial placement, patient advised to repeat in other arm 1-3 weeks after this test. Allergic reaction: no  Shiva Sahagian L, RN  

## 2017-02-15 IMAGING — US US OB TRANSVAGINAL
1 series · 14 of 28 positions shown · non-contrast
Comparison: 02/08/2015

CLINICAL DATA: Pelvic and back pain for 1 month. Brown discharge
for a few days but 1 awake. Pregnant with uncertain LMP.
Quantitative beta HCG was not obtained today but on 02/08/2015 was
[DATE].

EXAM:
OBSTETRIC <14 WK US AND TRANSVAGINAL OB US
TECHNIQUE: Both transabdominal and transvaginal ultrasound examinations were
performed for complete evaluation of the gestation as well as the
maternal uterus, adnexal regions, and pelvic cul-de-sac.
Transvaginal technique was performed to assess early pregnancy.

[Series 1: us ob transvaginal · 14 of 28 slices shown]
[im 2/28]
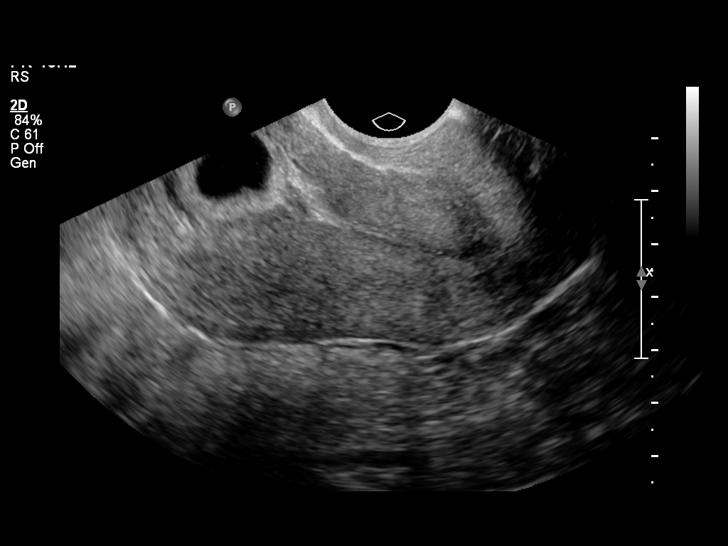
[im 4/28]
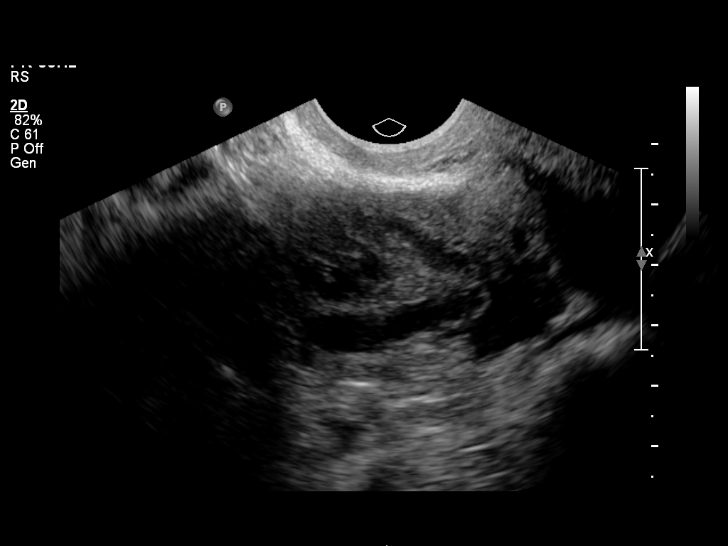
[im 6/28]
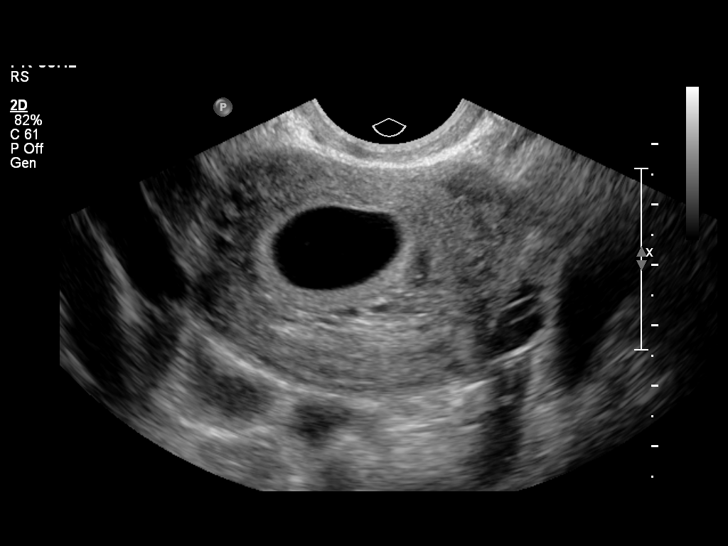
[im 8/28]
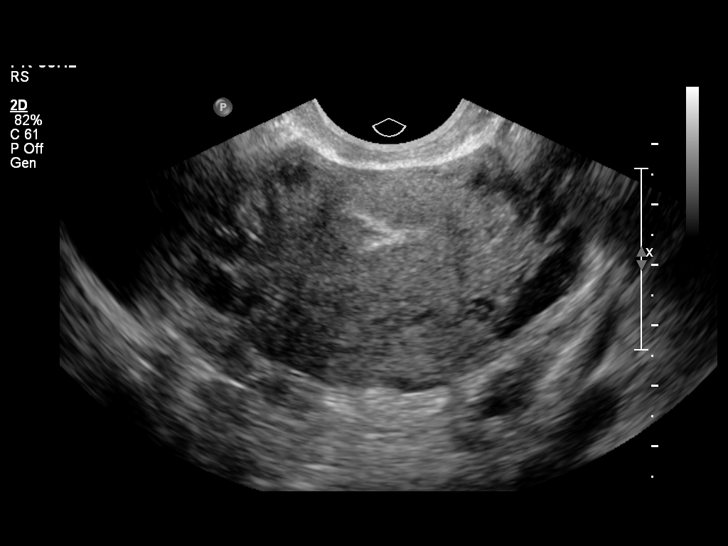
[im 10/28]
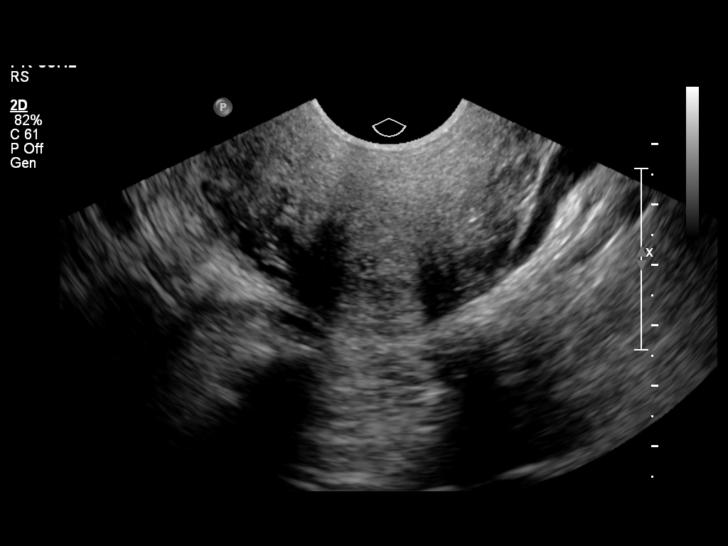
[im 12/28]
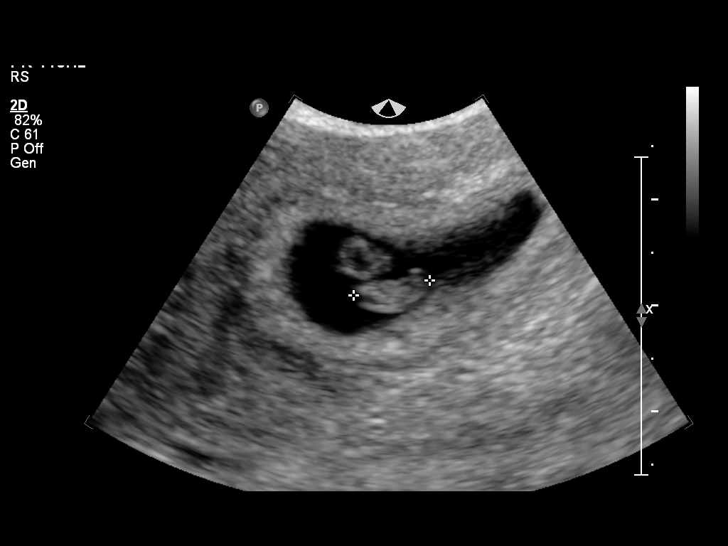
[im 14/28]
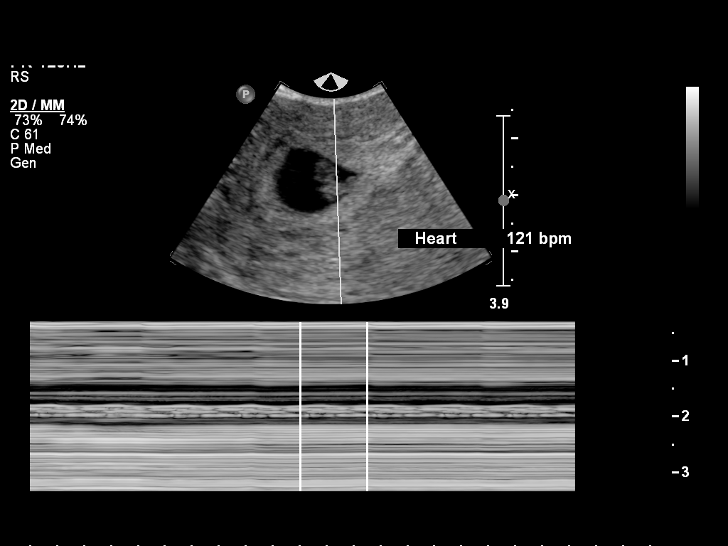
[im 16/28]
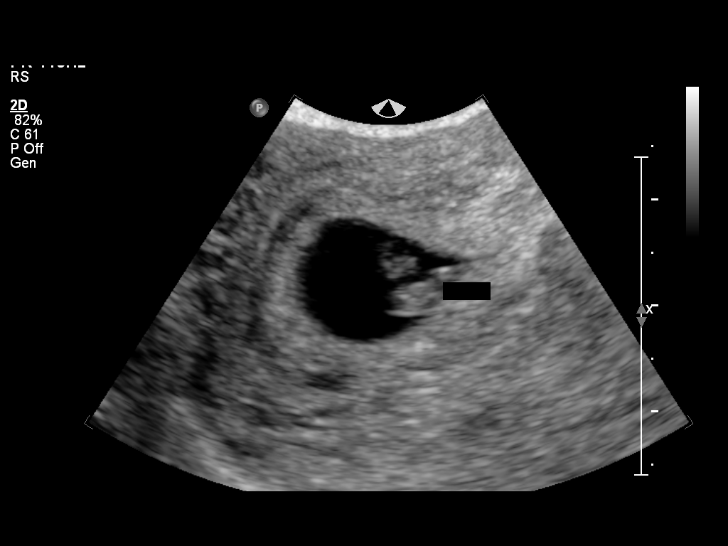
[im 18/28]
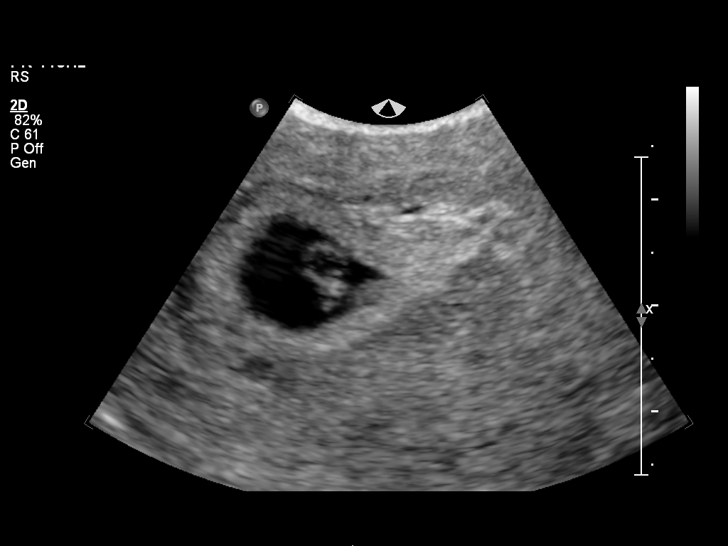
[im 20/28]
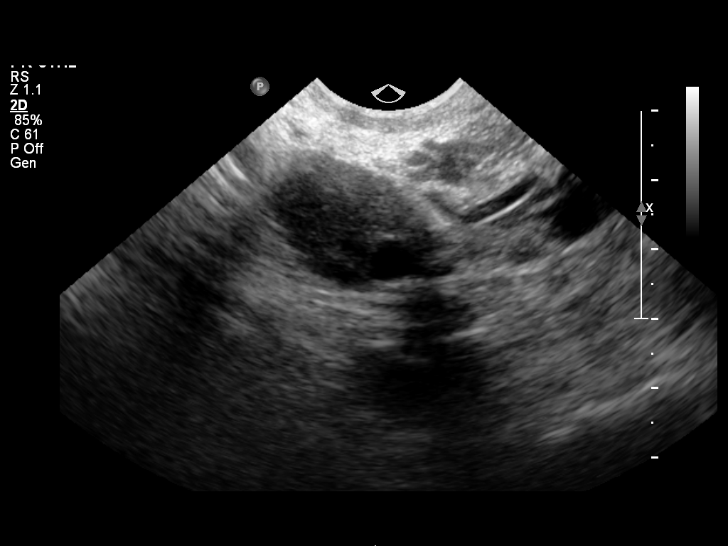
[im 22/28]
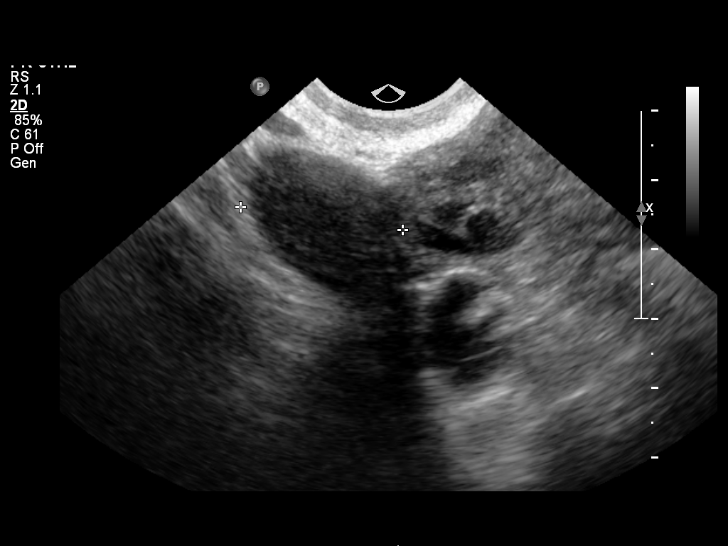
[im 24/28]
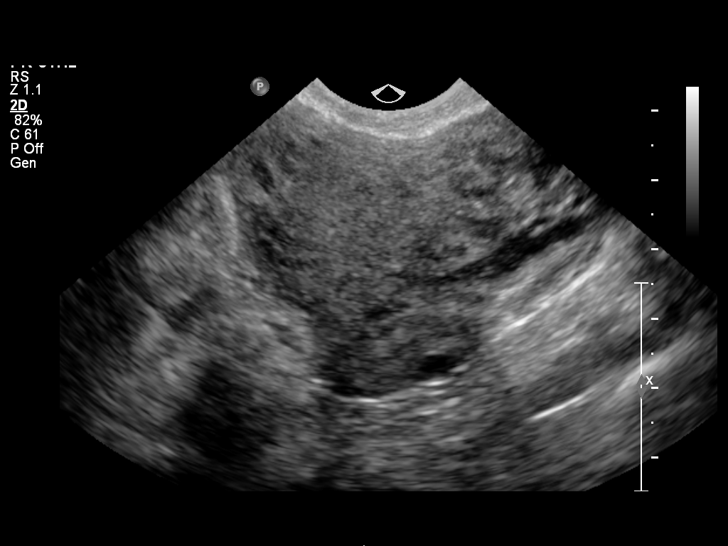
[im 26/28]
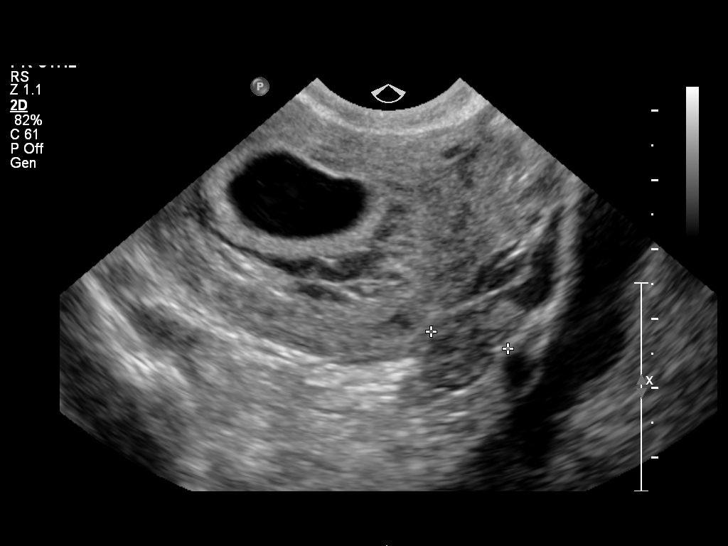
[im 28/28]
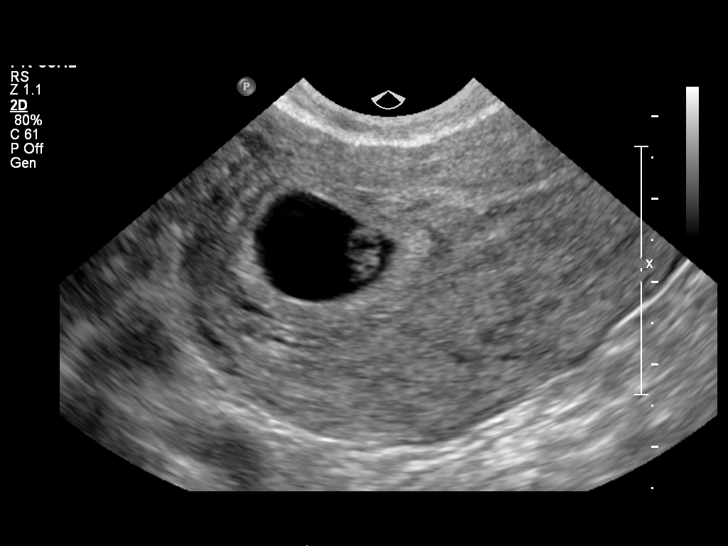

[14 of 28 positions shown; findings below may reference images not displayed]

FINDINGS: Intrauterine gestational sac: Single intrauterine gestation is
present.

Yolk sac:  Yolk sac is present.

Embryo:  Fetal pole is present.

Cardiac Activity: Fetal cardiac activity is observed.

Heart Rate: 121  bpm

CRL:  7.5  mm   6 w   5 d                  US EDC: 10/08/2015

Maternal uterus/adnexae: Uterus is mildly anteverted. No myometrial
mass lesions identified. No subchorionic hemorrhage. Both ovaries
are visualized and appear normal. No free pelvic fluid.
IMPRESSION: Single intrauterine pregnancy with estimated gestational age based
on crown-rump length of 6 weeks 5 days. No acute complication is
suggested.

## 2017-02-22 ENCOUNTER — Ambulatory Visit: Payer: Self-pay | Admitting: Family Medicine

## 2017-03-19 ENCOUNTER — Emergency Department (HOSPITAL_COMMUNITY): Payer: Medicaid Other

## 2017-03-19 ENCOUNTER — Emergency Department (HOSPITAL_COMMUNITY)
Admission: EM | Admit: 2017-03-19 | Discharge: 2017-03-19 | Disposition: A | Discharge status: Home / Self Care | Payer: Medicaid Other | Attending: Emergency Medicine | Admitting: Emergency Medicine

## 2017-03-19 ENCOUNTER — Encounter (HOSPITAL_COMMUNITY): Payer: Self-pay | Admitting: *Deleted

## 2017-03-19 DIAGNOSIS — I10 Essential (primary) hypertension: Secondary | ICD-10-CM | POA: Insufficient documentation

## 2017-03-19 DIAGNOSIS — Z3201 Encounter for pregnancy test, result positive: Secondary | ICD-10-CM | POA: Insufficient documentation

## 2017-03-19 DIAGNOSIS — Z79899 Other long term (current) drug therapy: Secondary | ICD-10-CM | POA: Diagnosis not present

## 2017-03-19 DIAGNOSIS — Z91040 Latex allergy status: Secondary | ICD-10-CM | POA: Insufficient documentation

## 2017-03-19 DIAGNOSIS — R109 Unspecified abdominal pain: Secondary | ICD-10-CM | POA: Insufficient documentation

## 2017-03-19 LAB — COMPREHENSIVE METABOLIC PANEL
ALBUMIN: 3.7 g/dL (ref 3.5–5.0)
ALT: 11 U/L — ABNORMAL LOW (ref 14–54)
ANION GAP: 7 (ref 5–15)
AST: 14 U/L — ABNORMAL LOW (ref 15–41)
Alkaline Phosphatase: 61 U/L (ref 38–126)
BUN: 9 mg/dL (ref 6–20)
CALCIUM: 8.7 mg/dL — AB (ref 8.9–10.3)
CHLORIDE: 107 mmol/L (ref 101–111)
CO2: 23 mmol/L (ref 22–32)
Creatinine, Ser: 0.82 mg/dL (ref 0.44–1.00)
GFR calc non Af Amer: >60 mL/min (ref >60)
Glucose, Bld: 87 mg/dL (ref 65–99)
POTASSIUM: 3.9 mmol/L (ref 3.5–5.1)
SODIUM: 137 mmol/L (ref 135–145)
Total Bilirubin: 0.3 mg/dL (ref 0.3–1.2)
Total Protein: 6.8 g/dL (ref 6.5–8.1)

## 2017-03-19 LAB — URINALYSIS, ROUTINE W REFLEX MICROSCOPIC
Bilirubin Urine: NEGATIVE
Glucose, UA: NEGATIVE mg/dL
KETONES UR: NEGATIVE mg/dL
NITRITE: NEGATIVE
PH: 5 (ref 5.0–8.0)
Protein, ur: NEGATIVE mg/dL
SPECIFIC GRAVITY, URINE: 1.027 (ref 1.005–1.030)

## 2017-03-19 LAB — CBC WITH DIFFERENTIAL/PLATELET
BASOS PCT: 0 %
Basophils Absolute: 0 10*3/uL (ref 0.0–0.1)
EOS PCT: 2 %
Eosinophils Absolute: 0.1 10*3/uL (ref 0.0–0.7)
HCT: 37.8 % (ref 36.0–46.0)
Hemoglobin: 11.7 g/dL — ABNORMAL LOW (ref 12.0–15.0)
LYMPHS ABS: 1.5 10*3/uL (ref 0.7–4.0)
Lymphocytes Relative: 30 %
MCH: 25.6 pg — AB (ref 26.0–34.0)
MCHC: 31 g/dL (ref 30.0–36.0)
MCV: 82.7 fL (ref 78.0–100.0)
MONOS PCT: 9 %
Monocytes Absolute: 0.4 10*3/uL (ref 0.1–1.0)
Neutro Abs: 2.9 10*3/uL (ref 1.7–7.7)
Neutrophils Relative %: 59 %
PLATELETS: 245 10*3/uL (ref 150–400)
RBC: 4.57 MIL/uL (ref 3.87–5.11)
RDW: 14.4 % (ref 11.5–15.5)
WBC: 4.9 10*3/uL (ref 4.0–10.5)

## 2017-03-19 LAB — PREGNANCY, URINE: Preg Test, Ur: POSITIVE — AB

## 2017-03-19 LAB — LIPASE, BLOOD: Lipase: 16 U/L (ref 11–51)

## 2017-03-19 NOTE — Discharge Instructions (Signed)
Take Tylenol as needed for pain, follow-up with your primary doctor or OB/GYN doctor for further evaluation of her positive pregnancy test as we discussed, return to the emergency room he started having abdominal pain or worsening symptoms

## 2017-03-19 NOTE — ED Provider Notes (Signed)
[facility]-EMERGENCY DEPT Provider Note   CSN: [id number] Arrival date & time: 03/19/17  1026  By signing my name below, I, Christine Cervantes, attest that this documentation has been prepared under the direction and in the presence of [name], MD. Electronically Signed: Cynda Cervantes, Scribe. 03/19/17. 12:28 PM.  History   Chief Complaint Chief Complaint  Patient presents with  . Flank Pain   HPI Comments: Christine Cervantes is a 31 y.o. female with a history of urinary tract infections, who presents to the Emergency Department complaining of sudden-onset, intermittent right flank pain that began one month ago. Patient states initially she thought she pulled a muscle, but the pain has persisted. Patent states her pain is exacerbated by palpation. Patient states this is the first time she has been able to get to the emergency department. Patient reports associated radiation to the right lower back. No modifying/aggravating factors indicated. Patient denies any birth control or estrogen use. Patient denies any dysuria, hematuria, urinary frequency, vaginal discharge, or vaginal bleeding.   Patient is also complaining of left ankle pain that began one month ago. Patient reports twisting her ankle several days ago. Patient reports associated swelling. No modifying factors indicated. Patient denies any numbness, tingling, or weakness.   The history is provided by the patient. No language interpreter was used.    Past Medical History:  Diagnosis Date  . Abnormal Pap smear    cryo on cervix  . Anemia   . Chlamydia   . Depression   . Gonorrhea   . Headache(784.0)   . Pregnancy induced hypertension    Chronic hypertension  . Preterm labor   . Trichimoniasis   . Urinary tract infection   . Vaginal Pap smear, abnormal     Patient Active Problem List   Diagnosis Date Noted  . SVD (spontaneous vaginal delivery) 04/18/2016  . Hypertension, postpartum condition or complication 04/15/2016  . Positive  GBS test 04/13/2016  . Supervision of high risk pregnancy, antepartum 02/25/2016  . Chronic hypertension during pregnancy, antepartum 02/10/2016  . History of preterm delivery, currently pregnant in second trimester 12/28/2015  . Anemia, iron deficiency 02/25/2013  . Essential hypertension 02/17/2013  . Depression 07/05/2009  . OBESITY 03/04/2008    Past Surgical History:  Procedure Laterality Date  . FOOT SURGERY  2004-2004   corns removed from both feet, hammertoes repaired  . GYNECOLOGIC CRYOSURGERY      OB History    Gravida Para Term Preterm AB Living   6 4 2 2 2 4    SAB TAB Ectopic Multiple Live Births   2     0 4       Home Medications    Prior to Admission medications   Medication Sig Start Date End Date Taking? Authorizing Provider  acetaminophen (TYLENOL) 325 MG tablet Take 2 tablets (650 mg total) by mouth every 4 (four) hours as needed for moderate pain. 04/18/16   Palma Holter, MD  ibuprofen (ADVIL,MOTRIN) 600 MG tablet Take 1 tablet (600 mg total) by mouth every 6 (six) hours as needed for cramping. 04/18/16   Palma Holter, MD  Prenatal Vit-Fe Fumarate-FA (PRENATAL MULTIVITAMIN) TABS tablet Take 1 tablet by mouth daily at 12 noon. 04/18/16   Palma Holter, MD  senna-docusate (SENOKOT-S) 8.6-50 MG tablet Take 1 tablet by mouth at bedtime. 04/18/16   Palma Holter, MD    Family History Family History  Problem Relation Age of Onset  . Hypertension Mother   .  Hypertension Maternal Aunt   . Hypertension Maternal Grandmother   . Diabetes Paternal Grandmother   . Anesthesia problems Neg Hx   . Other Neg Hx     Social History Social History  Substance Use Topics  . Smoking status: Never Smoker  . Smokeless tobacco: Never Used  . Alcohol use 0.0 oz/week     Comment: occ before preg     Allergies   Latex   Review of Systems Review of Systems  Constitutional: Negative for chills and fever.  Genitourinary: Positive for flank  pain (right). Negative for dysuria, frequency, hematuria, vaginal discharge and vaginal pain.  Musculoskeletal: Positive for arthralgias and back pain.  Neurological: Negative for weakness and numbness.  All other systems reviewed and are negative.    Physical Exam Updated Vital Signs BP (!) 161/110   Pulse 66   Temp 98.4 F (36.9 C) (Oral)   Resp 16   Ht 1.626 m (5\' 4" )   Wt 99.8 kg (220 lb)   LMP 03/08/2017 (Approximate)   SpO2 100%   BMI 37.76 kg/m   Physical Exam  Constitutional: She appears well-developed and well-nourished. No distress.  HENT:  Head: Normocephalic and atraumatic.  Right Ear: External ear normal.  Left Ear: External ear normal.  Eyes: Conjunctivae are normal. Right eye exhibits no discharge. Left eye exhibits no discharge. No scleral icterus.  Neck: Neck supple. No tracheal deviation present.  Cardiovascular: Normal rate, regular rhythm and intact distal pulses.   Pulmonary/Chest: Effort normal and breath sounds normal. No stridor. No respiratory distress. She has no wheezes. She has no rales.  Abdominal: Soft. Bowel sounds are normal. She exhibits no distension. There is no tenderness. There is no rebound and no guarding.  No CVA tenderness.   Musculoskeletal: She exhibits edema. She exhibits no tenderness.       Right ankle: No tenderness. No lateral malleolus and no medial malleolus tenderness found.       Left ankle: No tenderness. No lateral malleolus and no medial malleolus tenderness found.  Trace edema bilateral ankles.   Neurological: She is alert. She has normal strength. No cranial nerve deficit (no facial droop, extraocular movements intact, no slurred speech) or sensory deficit. She exhibits normal muscle tone. She displays no seizure activity. Coordination normal.  Skin: Skin is warm and dry. No rash noted.  Psychiatric: She has a normal mood and affect.  Nursing note and vitals reviewed.    ED Treatments / Results  DIAGNOSTIC  STUDIES: Oxygen Saturation is 96% on RA, adequate by my interpretation.    COORDINATION OF CARE: 12:26 PM Discussed treatment plan with pt at bedside and pt agreed to plan, which includes imaging.   Labs (all labs ordered are listed, but only abnormal results are displayed) Labs Reviewed  URINALYSIS, ROUTINE W REFLEX MICROSCOPIC - Abnormal; Notable for the following:       Result Value   APPearance HAZY (*)    Hgb urine dipstick SMALL (*)    Leukocytes, UA TRACE (*)    Bacteria, UA RARE (*)    Squamous Epithelial / LPF 0-5 (*)    All other components within normal limits  CBC WITH DIFFERENTIAL/PLATELET - Abnormal; Notable for the following:    Hemoglobin 11.7 (*)    MCH 25.6 (*)    All other components within normal limits  COMPREHENSIVE METABOLIC PANEL - Abnormal; Notable for the following:    Calcium 8.7 (*)    AST 14 (*)    ALT 11 (*)  All other components within normal limits  PREGNANCY, URINE - Abnormal; Notable for the following:    Preg Test, Ur POSITIVE (*)    All other components within normal limits  LIPASE, BLOOD    EKG  EKG Interpretation None       Radiology Dg Chest 2 View  Result Date: 03/19/2017 CLINICAL DATA:  Chest pain EXAM: CHEST  2 VIEW COMPARISON:  October 15, 2010 FINDINGS: Lungs are clear. Heart size is upper normal with pulmonary vascularity within normal limits. No adenopathy. No pneumothorax. No bone lesions. IMPRESSION: No edema or consolidation. Electronically Signed   By: Bretta BangWilliam  Woodruff III M.D.   On: 03/19/2017 13:18    Procedures Procedures (including critical care time)  Medications Ordered in ED Medications - No data to display   Initial Impression / Assessment and Plan / ED Course  I have reviewed the triage vital signs and the nursing notes.  Pertinent labs & imaging results that were available during my care of the patient were reviewed by me and considered in my medical decision making (see chart for  details).  Clinical Course as of Mar 20 1411  Mon Mar 19, 2017  1408 Pt's pregnancy test is positive.  Pt is surprised.   She states she had a menstrual period this month although it was earlier than usual.  Discussed doing an ultrasound and a serum quant.  Pt states she has to go pick up her kids.  She wants to follow up with her primary doctor.  Discussed taking tylenol for her flank pain in the ribs (doubt that this would be related to an ectopic pregnancy but we did discuss that we would need to do the ultrasound to confirm that).  Pt will follow up with PCP and knows to return if she starts having abd pain or worsening symptoms  [JK]    Clinical Course User Index [JK] Linwood DibblesKnapp, Audra Kagel, MD   Labs reassuring with the exception of the pregnancy test.  Suspect this is incidental.   Discussed doing an US but pt needs to leave.  At this time there does not appear to be any evidence of an acute emergency medical condition and the patient appears stable for discharge with appropriate outpatient follow up.   Final Clinical Impressions(s) / ED Diagnoses   Final diagnoses:  Flank pain  Positive pregnancy test    New Prescriptions New Prescriptions   No medications on file   I personally performed the services described in this documentation, which was scribed in my presence.  The recorded information has been reviewed and is accurate.     Linwood DibblesKnapp, Sebasthian Stailey, MD 03/19/17 [phone]

## 2017-03-19 NOTE — ED Notes (Signed)
Patient transported to X-ray 

## 2017-03-19 NOTE — ED Triage Notes (Signed)
C/o right flank  pain onset 1 month ago denies urinary sx.  also wants left ankle checked

## 2017-04-19 ENCOUNTER — Encounter: Payer: Medicaid Other | Admitting: Internal Medicine

## 2017-04-20 ENCOUNTER — Encounter: Payer: Medicaid Other | Admitting: Internal Medicine

## 2017-05-01 ENCOUNTER — Ambulatory Visit (INDEPENDENT_AMBULATORY_CARE_PROVIDER_SITE_OTHER): Payer: Medicaid Other | Admitting: Internal Medicine

## 2017-05-01 VITALS — BP 138/96 | HR 76 | Temp 98.4°F | Ht 64.0 in | Wt 223.0 lb

## 2017-05-01 DIAGNOSIS — Z3009 Encounter for other general counseling and advice on contraception: Secondary | ICD-10-CM | POA: Diagnosis not present

## 2017-05-01 DIAGNOSIS — F4321 Adjustment disorder with depressed mood: Secondary | ICD-10-CM

## 2017-05-01 DIAGNOSIS — I10 Essential (primary) hypertension: Secondary | ICD-10-CM | POA: Diagnosis not present

## 2017-05-01 LAB — POCT URINE PREGNANCY: PREG TEST UR: NEGATIVE

## 2017-05-01 MED ORDER — MEDROXYPROGESTERONE ACETATE 150 MG/ML IM SUSY
150.0000 mg | PREFILLED_SYRINGE | Freq: Once | INTRAMUSCULAR | Status: AC
  Administered 2017-05-01: 150 mg via INTRAMUSCULAR

## 2017-05-01 MED ORDER — CITALOPRAM HYDROBROMIDE 10 MG PO TABS: 10.0000 mg | ORAL_TABLET | Freq: Every day | ORAL | 0 refills | Status: DC

## 2017-05-01 MED ORDER — HYDROCHLOROTHIAZIDE 12.5 MG PO TABS: 12.5000 mg | ORAL_TABLET | Freq: Every day | ORAL | 3 refills | Status: DC

## 2017-05-01 NOTE — Patient Instructions (Signed)
I want you to follow up in 2 weeks. I want you to start take HCTZ for your blood pressure. For the depression I want you to try Celexa.

## 2017-05-01 NOTE — Progress Notes (Deleted)
   Christine Cervantes is a 31 y.o. female presents to office today for annual physical exam examination.  Concerns today include:  1. ***  Last eye exam: *** Last dental exam: *** Last colonoscopy: *** Last mammogram: *** Last pap smear: *** Immunizations needed: *** Refills needed today: ***  Women's Health  Periods: *** Contraception: *** Pelvic symptoms: *** Sexual activity: *** STD Screening: *** Pap smear status: *** Exercise: *** Diet: *** Smoking: *** Alcohol: *** Drugs: *** Advance directives: *** Mood: *** Dentist: ***    Past Medical History:  Diagnosis Date  . Abnormal Pap smear    cryo on cervix  . Anemia   . Chlamydia   . Depression   . Gonorrhea   . Headache(784.0)   . Pregnancy induced hypertension    Chronic hypertension  . Preterm labor   . Trichimoniasis   . Urinary tract infection   . Vaginal Pap smear, abnormal    Social History   Social History  . Marital status: Single    Spouse name: N/A  . Number of children: N/A  . Years of education: N/A   Occupational History  . Not on file.   Social History Main Topics  . Smoking status: Never Smoker  . Smokeless tobacco: Never Used  . Alcohol use 0.0 oz/week     Comment: occ before preg  . Drug use: No  . Sexual activity: Yes    Birth control/ protection: None     Comment: January 22 2015 last intercourse; ist depo in Jan 2016   Other Topics Concern  . Not on file   Social History Narrative  . No narrative on file   Past Surgical History:  Procedure Laterality Date  . FOOT SURGERY  2004-2004   corns removed from both feet, hammertoes repaired  . GYNECOLOGIC CRYOSURGERY     Family History  Problem Relation Age of Onset  . Hypertension Mother   . Hypertension Maternal Aunt   . Hypertension Maternal Grandmother   . Diabetes Paternal Grandmother   . Anesthesia problems Neg Hx   . Other Neg Hx     ROS: Review of Systems ROS  Physical exam Physical Exam   Assessment/  Plan: Patient with *** here for annual physical exam.   No problem-specific Assessment & Plan notes found for this encounter.    Noralee CharsAsiyah Mikell PGY-2, [facility]

## 2017-05-01 NOTE — Progress Notes (Signed)
   [name] Noralee CharsAsiyah Christy Ehrsam, MD Phone: [phone]  Reason For Visit: Depression, Birth control, Elevated blood pressure   #Depression:  Symptoms: Patient states that everything is irritating. Patient indicates having moments when she feels extremely sad. Patient suddenly comes on. Patient indicates that sometimes she feels like she lashes out at the kids. Patient is going to be in school for nursning. 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 Patient recently came to depressed after grandmother passed away.  Age of onset of first mood disturbance: Last year, patient was pregnant at the time   Duration of CURRENT symptoms: Some of the pressure was off the patient, 8-9 people in the house  Impact on function:  Family history of psychiatric issues: Brother with hx of depression  Current and history of substance use: None  Medical conditions that might explain or contribute to symptoms:None   PHQ-9:14 MDQ (if indicated): Negative   Elevated blood pressure  Had high blood pressure doing pregnancy - patient has been having high blood pressure every since  Current Meds - Not currently on any medications  Denies CP, dyspnea, edema, dizziness / lightheadedness  Endorse headache - tylenol, usually self resolves   Birth control  Periods: regular periods  Contraception: Depo  Pelvic symptoms: none  Sexual activity: yes, not using condoms  STD Screening: none  Pap smear status: has had normal pap smears;last pap smear in 2016   Past Medical History Reviewed problem list.  Medications- reviewed and updated No additions to family history Social history- patient is a non- smoker  Objective: BP (!) 138/96   Pulse 76   Temp 98.4 F (36.9 C) (Oral)   Ht 5\' 4"  (1.626 m)   Wt 223 lb (101.2 kg)   LMP 03/12/2017   BMI 38.28 kg/m  Gen: NAD, alert, cooperative with exam Cardio: regular rate and rhythm, S1S2 heard, no murmurs appreciated Pulm: clear to  auscultation bilaterally, no wheezes, rhonchi or rales Extremities: warm, well perfused, No edema, cyanosis or clubbing;    Assessment/Plan: See problem based a/p  Essential hypertension Patient plan elevated blood pressure here at the clinic, recent history of elevated blood pressures per flow sheet review for several months. 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 Lives at home in her mother's house for financial reasons, 8 or 9 other people in the house which can be overwhelming at times. PHQ-9 - 14.  - Would like to try low dose SSRI, citalopram (CELEXA) 10 MG tablet; Take 1 tablet (10 mg total) by mouth daily.  Dispense: 30 tablet; Refill: 0 - Follow up in about 1 month or sooner as needed    Contraceptive management Contra-indicated for estrogen based birth control give blood pressure issues.  - POCT urine pregnancy - MedroxyPROGESTERone Acetate SUSY 150 mg; Inject 1 mL (150 mg total) into the muscle once.

## 2017-05-02 ENCOUNTER — Encounter: Payer: Self-pay | Admitting: Internal Medicine

## 2017-05-02 LAB — CMP14+EGFR
A/G RATIO: 1.5 (ref 1.2–2.2)
ALBUMIN: 4 g/dL (ref 3.5–5.5)
ALK PHOS: 63 IU/L (ref 39–117)
ALT: 17 IU/L (ref 0–32)
AST: 20 IU/L (ref 0–40)
BUN / CREAT RATIO: 11 (ref 9–23)
BUN: 9 mg/dL (ref 6–20)
CHLORIDE: 105 mmol/L (ref 96–106)
CO2: 21 mmol/L (ref 20–29)
CREATININE: 0.8 mg/dL (ref 0.57–1.00)
Calcium: 9 mg/dL (ref 8.7–10.2)
GFR calc Af Amer: 112 mL/min/{1.73_m2} (ref >59)
GFR calc non Af Amer: 97 mL/min/{1.73_m2} (ref >59)
Globulin, Total: 2.7 g/dL (ref 1.5–4.5)
Glucose: 89 mg/dL (ref 65–99)
POTASSIUM: 4.3 mmol/L (ref 3.5–5.2)
SODIUM: 139 mmol/L (ref 134–144)
Total Protein: 6.7 g/dL (ref 6.0–8.5)

## 2017-05-03 DIAGNOSIS — Z309 Encounter for contraceptive management, unspecified: Secondary | ICD-10-CM | POA: Insufficient documentation

## 2017-05-03 NOTE — Assessment & Plan Note (Signed)
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 Lives at home in her mother's house for financial reasons, 8 or 9 other people in the house which can be overwhelming at times. PHQ-9 - 14.  - Would like to try low dose SSRI, citalopram (CELEXA) 10 MG tablet; Take 1 tablet (10 mg total) by mouth daily.  Dispense: 30 tablet; Refill: 0 - Follow up in about 1 month or sooner as needed

## 2017-05-03 NOTE — Assessment & Plan Note (Signed)
Patient plan elevated blood pressure here at the clinic, recent history of elevated blood pressures per flow sheet review for several months. Fmhx of elevated blood pressure  - Obtain BMET to ensure no kidney dysfunction  - Start HCTZ 12.5 mg  - Follow up in 1 month

## 2017-05-03 NOTE — Assessment & Plan Note (Signed)
Contra-indicated for estrogen based birth control give blood pressure issues.  - POCT urine pregnancy - MedroxyPROGESTERone Acetate SUSY 150 mg; Inject 1 mL (150 mg total) into the muscle once.

## 2017-05-18 ENCOUNTER — Ambulatory Visit: Payer: Medicaid Other | Admitting: Internal Medicine

## 2017-06-01 ENCOUNTER — Other Ambulatory Visit: Payer: Self-pay | Admitting: Internal Medicine

## 2017-06-01 DIAGNOSIS — F4321 Adjustment disorder with depressed mood: Secondary | ICD-10-CM

## 2017-06-12 ENCOUNTER — Ambulatory Visit: Payer: Medicaid Other | Admitting: Internal Medicine

## 2017-06-19 ENCOUNTER — Encounter: Payer: Self-pay | Admitting: Internal Medicine

## 2017-06-19 ENCOUNTER — Ambulatory Visit (INDEPENDENT_AMBULATORY_CARE_PROVIDER_SITE_OTHER): Payer: Medicaid Other | Admitting: Internal Medicine

## 2017-06-19 DIAGNOSIS — I10 Essential (primary) hypertension: Secondary | ICD-10-CM

## 2017-06-19 DIAGNOSIS — Z1159 Encounter for screening for other viral diseases: Secondary | ICD-10-CM | POA: Insufficient documentation

## 2017-06-19 DIAGNOSIS — F4321 Adjustment disorder with depressed mood: Secondary | ICD-10-CM | POA: Diagnosis present

## 2017-06-19 MED ORDER — CITALOPRAM HYDROBROMIDE 10 MG PO TABS: 10.0000 mg | ORAL_TABLET | Freq: Every day | ORAL | 0 refills | Status: DC

## 2017-06-19 MED ORDER — HYDROCHLOROTHIAZIDE 12.5 MG PO TABS: 12.5000 mg | ORAL_TABLET | Freq: Every day | ORAL | 3 refills | Status: DC

## 2017-06-19 NOTE — Progress Notes (Deleted)
   [facility] Christine CharsAsiyah Mikell, MD Phone: [phone]  Reason For Visit:   # *** -   Past Medical History Reviewed problem list.  Medications- reviewed and updated No additions to family history Social history- patient is a *** smoker  Objective: BP 124/80   Pulse 75   Temp 98 F (36.7 C) (Oral)   Ht 5\' 4"  (1.626 m)   Wt 215 lb (97.5 kg)   SpO2 99%   BMI 36.90 kg/m  Gen: NAD, alert, cooperative with exam HEENT: Normal    Neck: No masses palpated. No lymphadenopathy    Ears: Tympanic membranes intact, normal light reflex, no erythema, no bulging    Eyes: PERRLA, EOMI    Nose: nasal turbinates moist    Throat: moist mucus membranes, no erythema Cardio: regular rate and rhythm, S1S2 heard, no murmurs appreciated Pulm: clear to auscultation bilaterally, no wheezes, rhonchi or rales GI: soft, non-tender, non-distended, bowel sounds present, no hepatomegaly, no splenomegaly GU: external vaginal tissue ***, cervix ***, *** punctate lesions on cervix appreciated, *** discharge from cervical os, *** bleeding, *** cervical motion tenderness, *** abdominal/ adnexal masses Extremities: warm, well perfused, No edema, cyanosis or clubbing;  MSK: Normal gait and station Skin: dry, intact, no rashes or lesions Neuro: Strength and sensation grossly intact   Assessment/Plan: See problem based a/p  No problem-specific Assessment & Plan notes found for this encounter.

## 2017-06-19 NOTE — Patient Instructions (Addendum)
I would continue the Celexa as we discussed previously. I am going to provide with a 90 day prescription. We could consider increasing your dose to 20 mg if you feel like you have improved

## 2017-06-19 NOTE — Progress Notes (Signed)
   [facility] Christine CharsAsiyah Latunya Kissick, MD Phone: [phone]  Reason For Visit:   # Depression, PQH-9 8012  - Patient states she is currently under a lot of stress. Her ex is suing her for custody of their children. She has been trying to get together lawyers She has not been able to work for the past couple of weeks. Lately she feels extremely tired from everything that is happening. She initially took the celexa which she felt was really helping. However, for the last couple of weeks, she was worried it was making her fatigued. Recently she found out the blood pressure medication she received was possibly mix with spirolactone accidentally, therefore she is wondering if that is the cause of her fatigue   #CHRONIC HTN: Received letter saying her blood pressure medication has been recalled  Current Meds - will prescribe a new prescription  Reports good compliance,  Did not take medication today, just received letter  yesterday so did not stop till today . Denies dizziness / lightheadedness   Past Medical History Reviewed problem list.  Medications- reviewed and updated No additions to family history Social history- patient is a non- smoker  Objective: BP 124/80   Pulse 75   Temp 98 F (36.7 C) (Oral)   Ht 5\' 4"  (1.626 m)   Wt 215 lb (97.5 kg)   SpO2 99%   BMI 36.90 kg/m  Gen: NAD, alert, cooperative with exam Cardio: regular rate and rhythm, S1S2 heard, no murmurs appreciated Pulm: clear to auscultation bilaterally, no wheezes, rhonchi or rales GI: soft, non-tender, non-distended, bowel sounds present, no hepatomegaly, no splenomegaly Extremities: warm, well perfused, No edema, cyanosis or clubbing;  Skin: dry, intact, no rashes or lesions  Assessment/Plan: See problem based a/p  Depression Again discussed counseling with patient. She currently does not have time. Would like to continue celexa, plans on restarting it again  Will follow up with patient in 1  month   Essential hypertension Recall on HCTZ. Discarded patient's currently pills. Refilled the HCTZ.  Will obtain BMET to check on potassium as pills had been mixed with spironolactone

## 2017-06-20 LAB — BASIC METABOLIC PANEL
BUN/Creatinine Ratio: 16 (ref 9–23)
BUN: 15 mg/dL (ref 6–20)
CO2: 25 mmol/L (ref 20–29)
CREATININE: 0.94 mg/dL (ref 0.57–1.00)
Calcium: 9.2 mg/dL (ref 8.7–10.2)
Chloride: 100 mmol/L (ref 96–106)
GFR calc non Af Amer: 80 mL/min/{1.73_m2} (ref >59)
GFR, EST AFRICAN AMERICAN: 92 mL/min/{1.73_m2} (ref >59)
GLUCOSE: 86 mg/dL (ref 65–99)
Potassium: 3.9 mmol/L (ref 3.5–5.2)
SODIUM: 139 mmol/L (ref 134–144)

## 2017-06-20 LAB — HIV ANTIBODY (ROUTINE TESTING W REFLEX): HIV Screen 4th Generation wRfx: NONREACTIVE

## 2017-06-20 LAB — RPR: RPR Ser Ql: NONREACTIVE

## 2017-06-21 NOTE — Assessment & Plan Note (Signed)
Again discussed counseling with patient. She currently does not have time. Would like to continue celexa, plans on restarting it again  Will follow up with patient in 1 month

## 2017-06-21 NOTE — Assessment & Plan Note (Signed)
Recall on HCTZ. Discarded patient's currently pills. Refilled the HCTZ.  Will obtain BMET to check on potassium as pills had been mixed with spironolactone

## 2017-06-22 ENCOUNTER — Telehealth: Payer: Self-pay | Admitting: Internal Medicine

## 2017-06-22 NOTE — Telephone Encounter (Signed)
Please call patient and let her know that her HIV and Syphilis are negative and her lab work is completely fine.

## 2017-06-25 ENCOUNTER — Encounter: Payer: Self-pay | Admitting: *Deleted

## 2017-06-25 NOTE — Telephone Encounter (Signed)
Letter sent to patient's mychart. Ole Lafon,CMA

## 2017-06-26 ENCOUNTER — Other Ambulatory Visit (HOSPITAL_COMMUNITY)
Admission: RE | Admit: 2017-06-26 | Discharge: 2017-06-26 | Disposition: A | Discharge status: Home / Self Care | Payer: Medicaid Other | Source: Ambulatory Visit | Attending: Family Medicine | Admitting: Family Medicine

## 2017-06-26 ENCOUNTER — Ambulatory Visit (INDEPENDENT_AMBULATORY_CARE_PROVIDER_SITE_OTHER): Payer: Medicaid Other | Admitting: Internal Medicine

## 2017-06-26 ENCOUNTER — Encounter: Payer: Self-pay | Admitting: Internal Medicine

## 2017-06-26 VITALS — BP 140/92 | HR 73 | Temp 99.3°F | Ht 64.0 in | Wt 218.2 lb

## 2017-06-26 DIAGNOSIS — Z01419 Encounter for gynecological examination (general) (routine) without abnormal findings: Secondary | ICD-10-CM

## 2017-06-26 DIAGNOSIS — Z1159 Encounter for screening for other viral diseases: Secondary | ICD-10-CM | POA: Insufficient documentation

## 2017-06-26 DIAGNOSIS — I10 Essential (primary) hypertension: Secondary | ICD-10-CM | POA: Diagnosis not present

## 2017-06-26 DIAGNOSIS — N760 Acute vaginitis: Secondary | ICD-10-CM | POA: Diagnosis not present

## 2017-06-26 DIAGNOSIS — B9689 Other specified bacterial agents as the cause of diseases classified elsewhere: Secondary | ICD-10-CM | POA: Insufficient documentation

## 2017-06-26 LAB — POCT WET PREP (WET MOUNT)
Clue Cells Wet Prep Whiff POC: NEGATIVE
TRICHOMONAS WET PREP HPF POC: ABSENT

## 2017-06-26 MED ORDER — METRONIDAZOLE 500 MG PO TABS: 500.0000 mg | ORAL_TABLET | Freq: Two times a day (BID) | ORAL | 0 refills | Status: DC

## 2017-06-26 NOTE — Assessment & Plan Note (Addendum)
Blood pressure slightly elevated today  Did not take HCTZ, plans to take when she gets home  Will follow up on

## 2017-06-26 NOTE — Assessment & Plan Note (Signed)
Hx of bacterial vaginosis. Odor noted by patient and on exam, per patient she has this issue often  - POCT Wet Prep ([place]et [place]) - metroNIDAZOLE (FLAGYL) 500 MG tablet; Take 1 tablet (500 mg total) by mouth 2 (two) times daily.  Dispense: 14 tablet; Refill: 0 - discussed stopping boric acid vaginal suppositories  - Would increase probiotics and fruits/vegetables that contain vitamin C

## 2017-06-26 NOTE — Progress Notes (Signed)
   [name] Christine Chars, MD Phone: [phone]  Reason For Visit: Pap smear   # Pap Smear /Women's Health  -She had abnormal Pap smear back in high school however has had normal Pap smears thereafter. - She denies any abnormal vaginal discharge, irritation or dyspareunia  - State she has had some vaginal spotting since starting the depo  - She states that several years ago she was told by a nurse at work acid capsules are helpful for bacterial vaginosis. She has still been taking this as needed vaginally   -She has not been sexually active in over 2 months -Contraception currently with Depo, next shot of Depo next month - Patient does not do regular breast exams however every once in a while will do a breast exam. No abnormalities, no nipple discharge, mass or skin changes  -No breast cancer history - Recently screened for HIV and RPR - both of which were negative   #Elevated blood pressure - Did not take her blood pressure medication this morning. No issues with taking medication - No headaches, no blurry vision   Past Medical History Reviewed problem list.  Medications- reviewed and updated No additions to family history Social history- patient is a non-smoker  Objective: BP (!) 140/92   Pulse 73   Temp 99.3 F (37.4 C) (Oral)   Ht  (1.626 m)   Wt 218 lb 3.2 oz (99 kg)   SpO2 99%   BMI 37.45 kg/m  Gen: NAD, alert, cooperative with exam GI: soft, non-tender, non-distended, bowel sounds present, uterus wnl  GU: external vaginal tissue wnl, cervix wnl, wnl punctate lesions on cervix appreciated, no discharge from cervical os, mild bleeding the vaginal canal, no cervical motion tenderness, no abdominal/ adnexal masses Extremities: warm, well perfused, No edema  Assessment/Plan: See problem based a/p  Women's annual routine gynecological examination Obtained Pap smear per patient request, previous pap smear within normal limits. Would like  evaluation of STDs.  - Cytology - PAP   Bacterial vaginosis Hx of bacterial vaginosis. Odor noted by patient and on exam, per patient she has this issue often  - POCT Wet Prep (Wet [place]) - metroNIDAZOLE (FLAGYL) 500 MG tablet; Take 1 tablet (500 mg total) by mouth 2 (two) times daily.  Dispense: 14 tablet; Refill: 0 - discussed stopping boric acid vaginal suppositories  - Would increase probiotics and fruits/vegetables that contain vitamin C   Essential hypertension Blood pressure slightly elevated today  Did not take HCTZ, plans to take when she gets home

## 2017-06-26 NOTE — Patient Instructions (Addendum)
I will let you know the results of your Pap smear either by phone or by mail. These should be back within about 1 week. I will send in a prescription for bacterial vaginosis. I would stop the boric acid as this has never been found to be effective for treating bacterial vaginosis in studies and has been noted in the past to cause vaginal irritation. Try to eat foods high in vitamin C

## 2017-06-26 NOTE — Assessment & Plan Note (Signed)
Obtained Pap smear per patient request, previous pap smear within normal limits. Would like evaluation of STDs.  - Cytology - PAP

## 2017-06-28 LAB — CYTOLOGY - PAP
CHLAMYDIA, DNA PROBE: NEGATIVE
HPV: DETECTED — AB
NEISSERIA GONORRHEA: NEGATIVE
TRICH (WINDOWPATH): NEGATIVE

## 2017-06-29 ENCOUNTER — Telehealth: Payer: Self-pay | Admitting: Internal Medicine

## 2017-06-29 NOTE — Telephone Encounter (Signed)
Called patient to let her know that Pap smear was positive for low-grade squamous cell intraepithelial lesion and HPV was detected. Patient will need a colposcopy. She can call and make an appointment today at our gynecology clinic.

## 2017-07-05 ENCOUNTER — Ambulatory Visit: Payer: Medicaid Other

## 2017-07-26 ENCOUNTER — Ambulatory Visit (INDEPENDENT_AMBULATORY_CARE_PROVIDER_SITE_OTHER): Payer: Medicaid Other | Admitting: Family Medicine

## 2017-07-26 VITALS — BP 112/72 | HR 80 | Temp 98.6°F | Ht 64.0 in | Wt 219.0 lb

## 2017-07-26 DIAGNOSIS — R87619 Unspecified abnormal cytological findings in specimens from cervix uteri: Secondary | ICD-10-CM

## 2017-07-26 DIAGNOSIS — R87612 Low grade squamous intraepithelial lesion on cytologic smear of cervix (LGSIL): Secondary | ICD-10-CM | POA: Diagnosis not present

## 2017-07-26 LAB — POCT URINE PREGNANCY: PREG TEST UR: NEGATIVE

## 2017-07-26 NOTE — Patient Instructions (Signed)
Colposcopy, Care After  This sheet gives you information about how to care for yourself after your procedure. Your doctor may also give you more specific instructions. If you have problems or questions, contact your doctor.  What can I expect after the procedure?  If you did not have a tissue sample removed (did not have a biopsy), you may only have some spotting for a few days. You can go back to your normal activities.  If you had a tissue sample removed, it is common to have:  · Soreness and pain. This may last for a few days.  · Light-headedness.  · Mild bleeding from your vagina or dark-colored, grainy discharge from your vagina. This may last for a few days. You may need to wear a sanitary pad.  · Spotting for at least 48 hours after the procedure.    Follow these instructions at home:  · Take over-the-counter and prescription medicines only as told by your doctor. Ask your doctor what medicines you can start taking again. This is very important if you take blood-thinning medicine.  · Do not drive or use heavy machinery while taking prescription pain medicine.  · For 3 days, or as long as your doctor tells you, avoid:  ? Douching.  ? Using tampons.  ? Having sex.  · If you use birth control (contraception), keep using it.  · Limit activity for the first day after the procedure. Ask your doctor what activities are safe for you.  · It is up to you to get the results of your procedure. Ask your doctor when your results will be ready.  · Keep all follow-up visits as told by your doctor. This is important.  Contact a doctor if:  · You get a skin rash.  Get help right away if:  · You are bleeding a lot from your vagina. It is a lot of bleeding if you are using more than one pad an hour for 2 hours in a row.  · You have clumps of blood (blood clots) coming from your vagina.  · You have a fever.  · You have chills  · You have pain in your lower belly (pelvic area).  · You have signs of infection, such as vaginal  discharge that is:  ? Different than usual.  ? Yellow.  ? Bad-smelling.  · You have very pain or cramps in your lower belly that do not get better with medicine.  · You feel light-headed.  · You feel dizzy.  · You pass out (faint).  Summary  · If you did not have a tissue sample removed (did not have a biopsy), you may only have some spotting for a few days. You can go back to your normal activities.  · If you had a tissue sample removed, it is common to have mild pain and spotting for 48 hours.  · For 3 days, or as long as your doctor tells you, avoid douching, using tampons and having sex.  · Get help right away if you have bleeding, very bad pain, or signs of infection.  This information is not intended to replace advice given to you by your health care provider. Make sure you discuss any questions you have with your health care provider.  Document Released: 03/13/2008 Document Revised: 06/14/2016 Document Reviewed: 06/14/2016  Elsevier Interactive Patient Education © 2018 Elsevier Inc.

## 2017-07-26 NOTE — Progress Notes (Signed)
Patient ID: Christine Cervantes, female   DOB: 06-28-1986, 31 y.o.   MRN: [id number]  Chief Complaint  Patient presents with  . Biopsy    HPI Christine Costamanda M Barstow is a 31 y.o. female.  Her for colposcopy due to abnormal PAP HPI  Indications: Pap smear on September 2018 showed: low-grade squamous intraepithelial neoplasia (LGSIL - encompassing HPV,mild dysplasia,CIN I). Previous colposcopy: NA. Prior cervical treatment: no treatment.  Past Medical History:  Diagnosis Date  . Abnormal Pap smear    cryo on cervix  . Anemia   . Chlamydia   . Depression   . Gonorrhea   . Headache(784.0)   . Pregnancy induced hypertension    Chronic hypertension  . Preterm labor   . Trichimoniasis   . Urinary tract infection   . Vaginal Pap smear, abnormal     Past Surgical History:  Procedure Laterality Date  . FOOT SURGERY  2004-2004   corns removed from both feet, hammertoes repaired  . GYNECOLOGIC CRYOSURGERY      Family History  Problem Relation Age of Onset  . Hypertension Mother   . Hypertension Maternal Aunt   . Hypertension Maternal Grandmother   . Diabetes Paternal Grandmother   . Anesthesia problems Neg Hx   . Other Neg Hx     Social History Social History  Substance Use Topics  . Smoking status: Never Smoker  . Smokeless tobacco: Never Used  . Alcohol use 0.0 oz/week     Comment: occ before preg    Allergies  Allergen Reactions  . Latex Swelling and Rash    Current Outpatient Prescriptions  Medication Sig Dispense Refill  . acetaminophen (TYLENOL) 325 MG tablet Take 2 tablets (650 mg total) by mouth every 4 (four) hours as needed for moderate pain. 90 tablet 0  . citalopram (CELEXA) 10 MG tablet Take 1 tablet (10 mg total) by mouth daily. 90 tablet 0  . hydrochlorothiazide (HYDRODIURIL) 12.5 MG tablet Take 1 tablet (12.5 mg total) by mouth daily. 90 tablet 3  . ibuprofen (ADVIL,MOTRIN) 600 MG tablet Take 1 tablet (600 mg total) by mouth every 6 (six) hours as needed for  cramping. 30 tablet 2  . metroNIDAZOLE (FLAGYL) 500 MG tablet Take 1 tablet (500 mg total) by mouth 2 (two) times daily. 14 tablet 0  . Prenatal Vit-Fe Fumarate-FA (PRENATAL MULTIVITAMIN) TABS tablet Take 1 tablet by mouth daily at 12 noon. 30 tablet 2  . senna-docusate (SENOKOT-S) 8.6-50 MG tablet Take 1 tablet by mouth at bedtime. 30 tablet 1   No current facility-administered medications for this visit.     Review of Systems Review of Systems  Respiratory: Negative.   Cardiovascular: Negative.   Genitourinary:       Abnormal PAP  All other systems reviewed and are negative.   Blood pressure 112/72, pulse 80, temperature 98.6 F (37 C), temperature source Oral, height 5\' 4"  (1.626 m), weight 219 lb (99.3 kg), unknown if currently breastfeeding.  Physical Exam Physical Exam  Constitutional: She appears well-developed. No distress.  Genitourinary: Vagina normal.    Nursing note and vitals reviewed.     Data Reviewed 07/26/17  Assessment    Procedure Details  The risks and benefits of the procedure and Written informed consent obtained.  Speculum placed in vagina and excellent visualization of cervix achieved, cervix swabbed x 3 with acetic acid solution and lugol's iodine.  Specimens: ECc and cervical biopsy at 3 o'clock  Complications: Mild bleeding.Marland Kitchen.     Plan  Specimens labelled and sent to Pathology. Return to discuss Pathology results in 2 weeks.      Janit Pagan 07/26/2017, 11:19 AM

## 2017-07-30 ENCOUNTER — Telehealth: Payer: Self-pay | Admitting: Family Medicine

## 2017-07-30 DIAGNOSIS — N871 Moderate cervical dysplasia: Secondary | ICD-10-CM

## 2017-07-30 NOTE — Telephone Encounter (Signed)
I called patient and discussed cervical biopsy result with her. Referral to Gyn for diagnostic/therapeutic excision recommended and referral has been placed. All questions were answered and she agreed with plan.

## 2017-07-30 NOTE — Telephone Encounter (Signed)
Pt is returning call. Libni Fusaro, Maryjo RochesterJessica Dawn, CMA

## 2017-07-30 NOTE — Telephone Encounter (Addendum)
HIPPA compliant call back message left.  NB: Per Colpo result, Cervical excision required. I placed referral to gyn

## 2017-07-30 NOTE — Addendum Note (Signed)
Addended by: Janit PaganENIOLA, Jeriah Skufca T on: 07/30/2017 08:52 AM   Modules accepted: Orders

## 2017-08-03 ENCOUNTER — Encounter: Payer: Self-pay | Admitting: Obstetrics and Gynecology

## 2017-08-07 ENCOUNTER — Ambulatory Visit: Payer: Medicaid Other

## 2017-09-02 IMAGING — US US OB COMP LESS 14 WK
1 series · 15 of 28 positions shown · non-contrast
Comparison: Prior obstetrical ultrasound 04/01/2015

CLINICAL DATA: 29-year-old female reportedly 4 weeks pregnant (last
menstrual period 08/07/2015) with abdominal pain. Quantitative beta
HCG is pending.

EXAM:
OBSTETRIC <14 WK US AND TRANSVAGINAL OB US
TECHNIQUE: Both transabdominal and transvaginal ultrasound examinations were
performed for complete evaluation of the gestation as well as the
maternal uterus, adnexal regions, and pelvic cul-de-sac.
Transvaginal technique was performed to assess early pregnancy.

[Series 1: us ob comp less 14 wk · 15 of 34 slices shown]
[im 1/34]
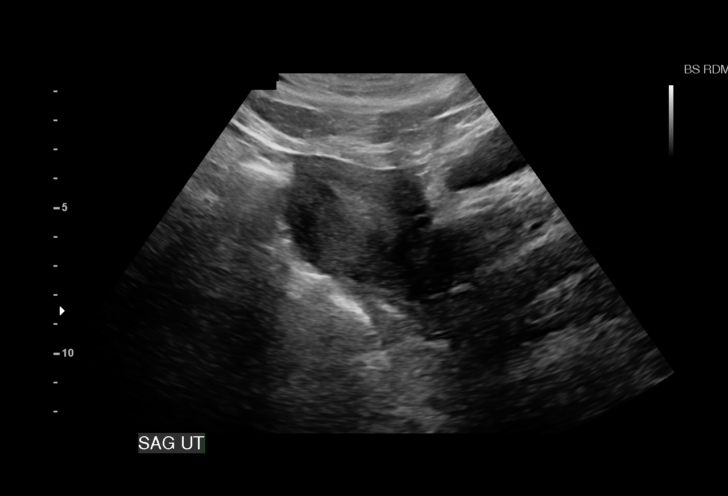
[im 3/34]
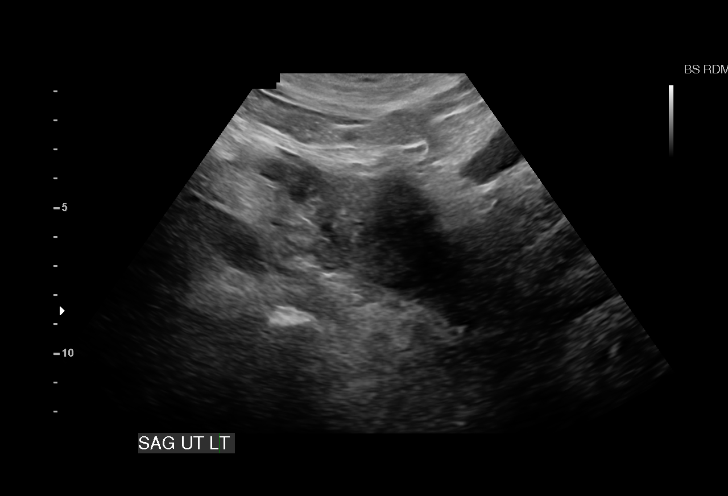
[im 5/34]
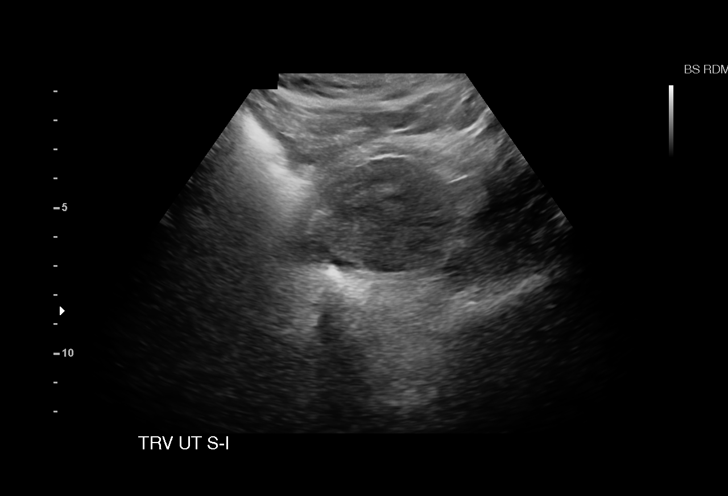
[im 8/34]
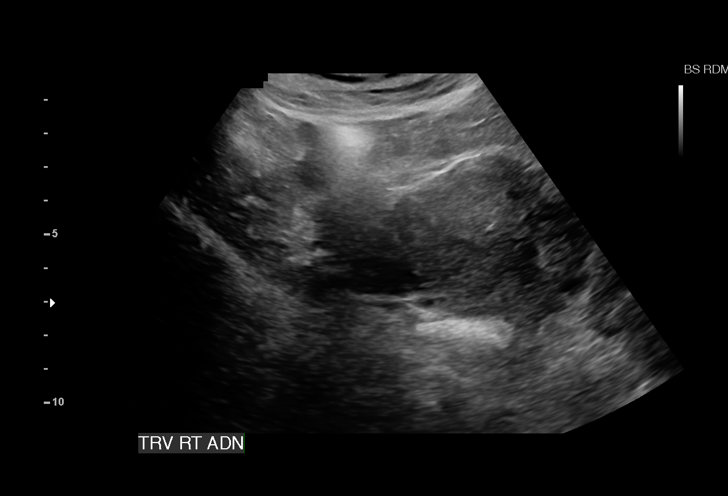
[im 10/34]
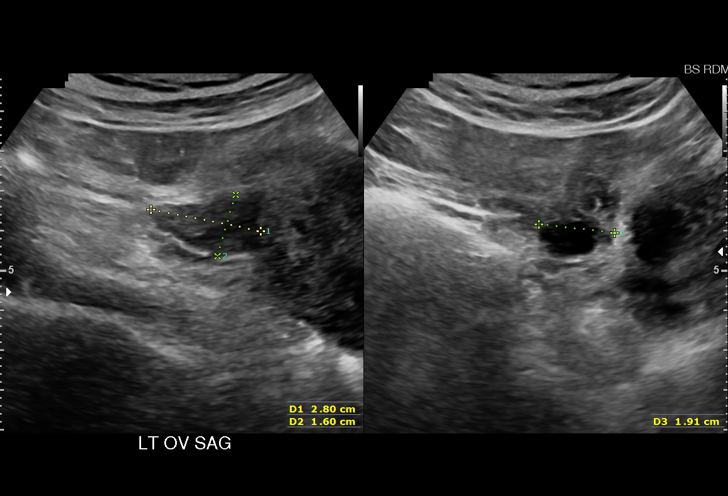
[im 13/34]
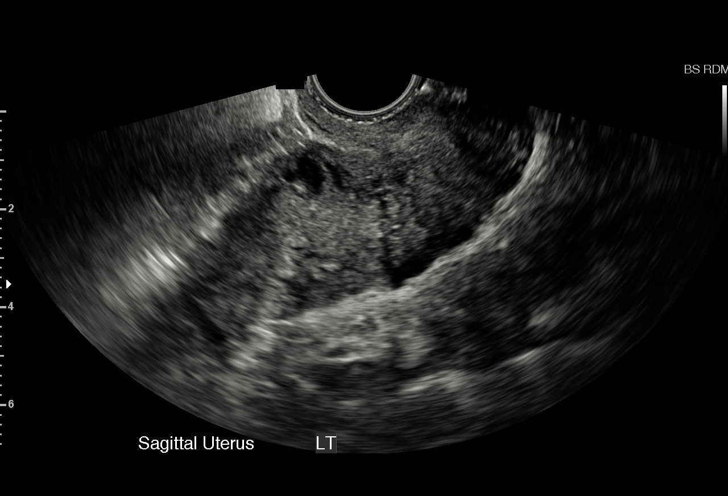
[im 15/34]
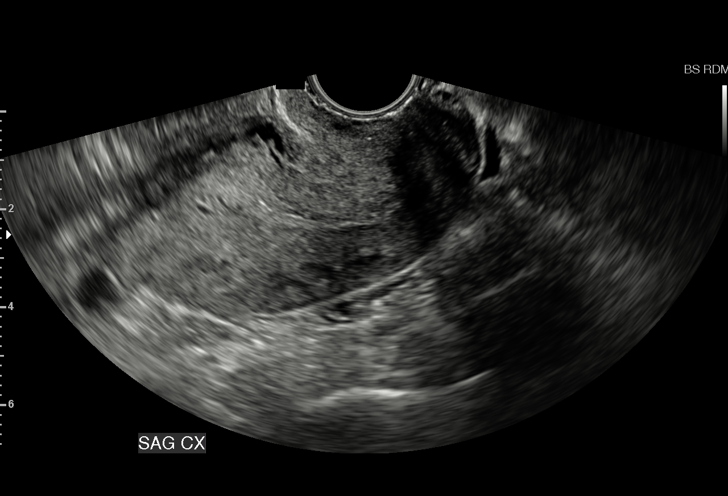
[im 18/34]
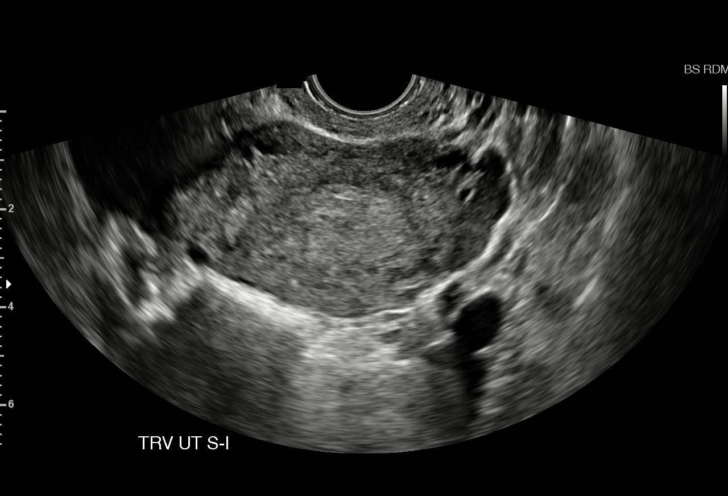
[im 19/34]
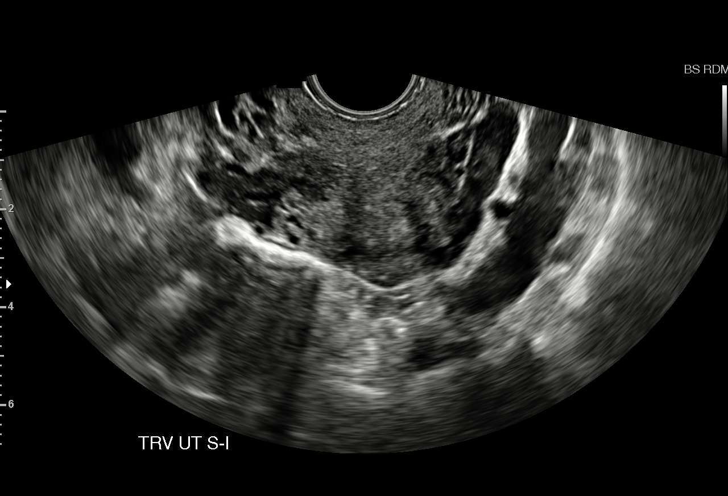
[im 21/34]
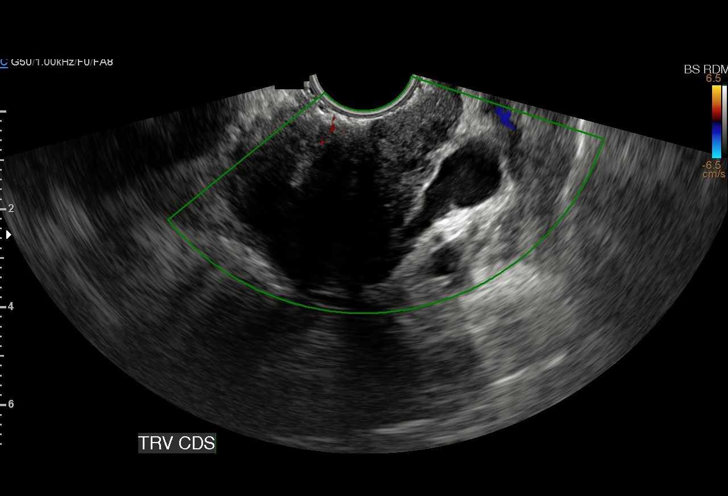
[im 24/34]
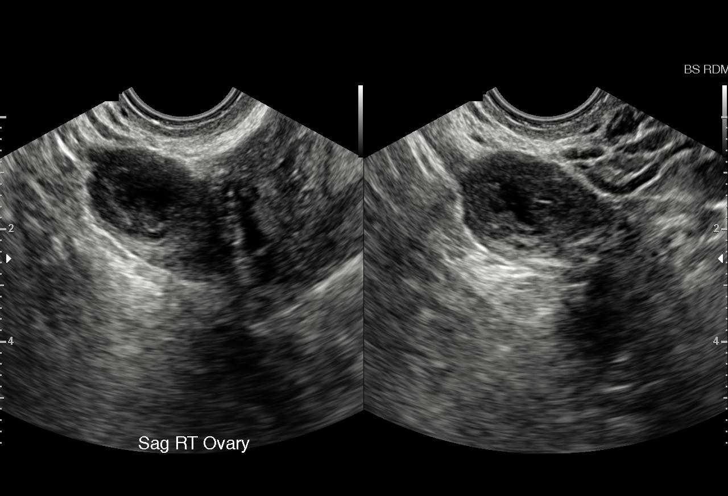
[im 26/34]
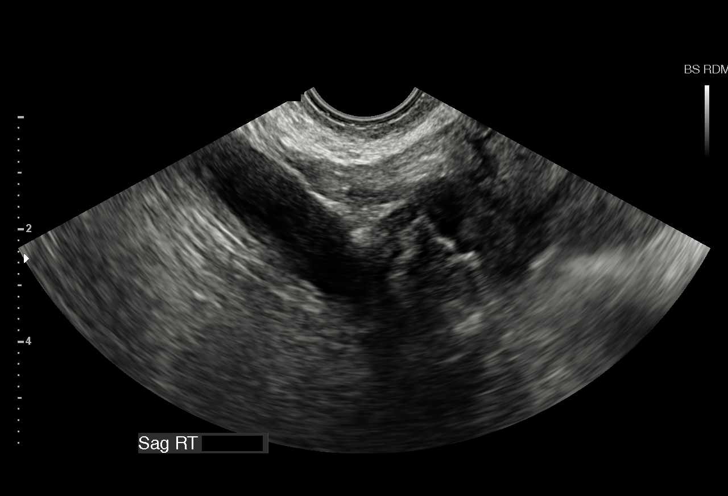
[im 29/34]
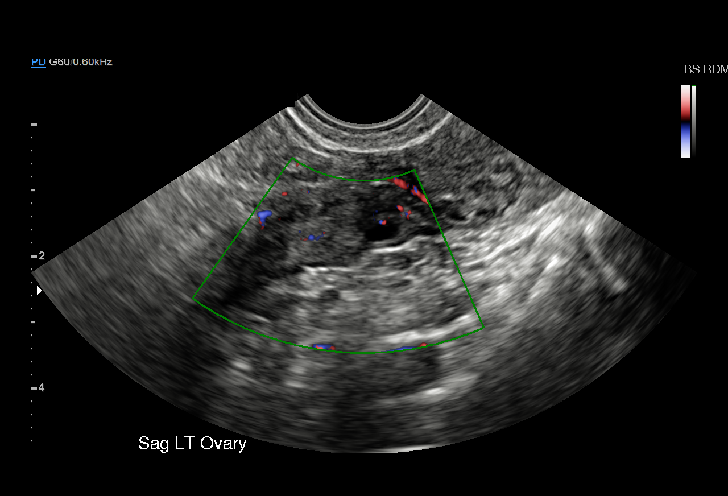
[im 31/34]
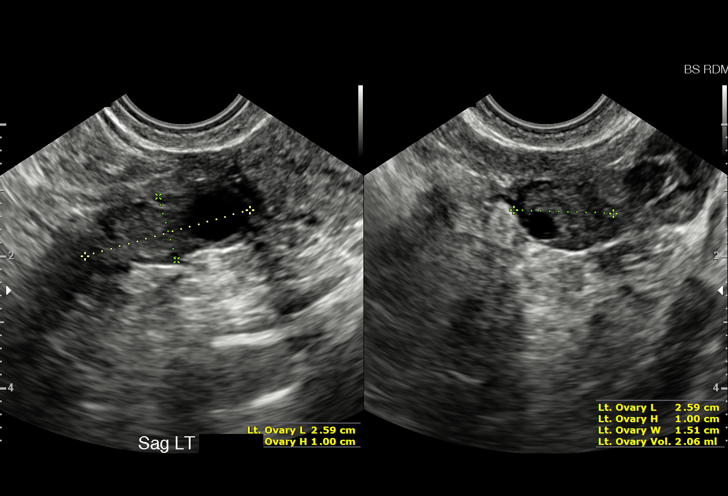
[im 34/34]
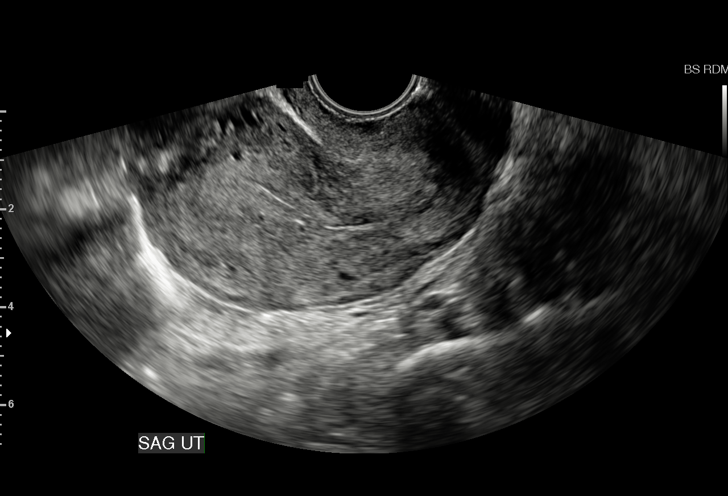

[15 of 28 positions shown; findings below may reference images not displayed]

FINDINGS: Intrauterine gestational sac: Nonvisualized

Yolk sac:  None

Embryo:  None

Cardiac Activity: Not applicable

Heart Rate: Not applicable

Maternal uterus/adnexae: Trace free fluid in the pelvic cul-de-sac
is likely physiologic. The uterus is unremarkable in appearance. The
bilateral ovaries are also within normal limits. Probable corpus
luteal cyst on the right.
IMPRESSION: No evidence of intrauterine gestational sac. Recommend correlation
with quantitative beta HCG when available. If the beta HCG is
negative, then this is essentially a normal pelvic ultrasound. If
the quantitative beta HCG is positive then differential
considerations include very early IUP, early failed IUP and ectopic
pregnancy and the recommendation would be for serial evaluation of
the beta HCG with a repeat ultrasound in 1- 2 weeks only if deemed
clinically necessary.

## 2017-09-05 ENCOUNTER — Ambulatory Visit: Payer: Medicaid Other

## 2017-09-06 ENCOUNTER — Ambulatory Visit (INDEPENDENT_AMBULATORY_CARE_PROVIDER_SITE_OTHER): Payer: Medicaid Other | Admitting: Obstetrics and Gynecology

## 2017-09-06 ENCOUNTER — Other Ambulatory Visit (HOSPITAL_COMMUNITY)
Admission: RE | Admit: 2017-09-06 | Discharge: 2017-09-06 | Disposition: A | Discharge status: Home / Self Care | Payer: Medicaid Other | Source: Ambulatory Visit | Attending: Obstetrics and Gynecology | Admitting: Obstetrics and Gynecology

## 2017-09-06 ENCOUNTER — Encounter: Payer: Self-pay | Admitting: Obstetrics and Gynecology

## 2017-09-06 VITALS — BP 135/96 | HR 74 | Ht 64.0 in | Wt 222.6 lb

## 2017-09-06 DIAGNOSIS — N939 Abnormal uterine and vaginal bleeding, unspecified: Secondary | ICD-10-CM | POA: Diagnosis not present

## 2017-09-06 DIAGNOSIS — N898 Other specified noninflammatory disorders of vagina: Secondary | ICD-10-CM | POA: Insufficient documentation

## 2017-09-06 DIAGNOSIS — N871 Moderate cervical dysplasia: Secondary | ICD-10-CM

## 2017-09-06 LAB — POCT PREGNANCY, URINE: PREG TEST UR: NEGATIVE

## 2017-09-06 NOTE — Progress Notes (Signed)
GYNECOLOGY OFFICE VISIT NOTE  History:  31 y.o. [id number] here today for follow up for CIN2 on colposcopy at family practice office.  H/o abnormal pap in 2000s, with cryotherapy and subsequently normal pap in 2013, 2016. LSGIL with positive high risk HPV in 06/2017 with colpo with CIN2 in 07/2017. Here for LEEP.  She denies any abnormal vaginal discharge, bleeding, pelvic pain or other concerns.   Also complains of spotting, has been on depo in past but last dose was in 05/2017, doesn't want to continue as she is on an anti-depressant and it made her have significant mood swings. Wondering if there are other options to control spotting.  Past Medical History:  Diagnosis Date  . Abnormal Pap smear    cryo on cervix  . Anemia   . Chlamydia   . Depression   . Gonorrhea   . Headache(784.0)   . Pregnancy induced hypertension    Chronic hypertension  . Preterm labor   . Trichimoniasis   . Urinary tract infection   . Vaginal Pap smear, abnormal     Past Surgical History:  Procedure Laterality Date  . FOOT SURGERY  2004-2004   corns removed from both feet, hammertoes repaired  . GYNECOLOGIC CRYOSURGERY       Current Outpatient Medications:  .  hydrochlorothiazide (HYDRODIURIL) 12.5 MG tablet, Take 1 tablet (12.5 mg total) by mouth daily., Disp: 90 tablet, Rfl: 3 .  acetaminophen (TYLENOL) 325 MG tablet, Take 2 tablets (650 mg total) by mouth every 4 (four) hours as needed for moderate pain. (Patient not taking: Reported on 09/06/2017), Disp: 90 tablet, Rfl: 0 .  citalopram (CELEXA) 10 MG tablet, Take 1 tablet (10 mg total) by mouth daily. (Patient not taking: Reported on 09/06/2017), Disp: 90 tablet, Rfl: 0 .  ibuprofen (ADVIL,MOTRIN) 600 MG tablet, Take 1 tablet (600 mg total) by mouth every 6 (six) hours as needed for cramping. (Patient not taking: Reported on 09/06/2017), Disp: 30 tablet, Rfl: 2 .  metroNIDAZOLE (FLAGYL) 500 MG tablet, Take 1 tablet (500 mg total) by mouth 2 (two)  times daily. (Patient not taking: Reported on 09/06/2017), Disp: 14 tablet, Rfl: 0 .  Prenatal Vit-Fe Fumarate-FA (PRENATAL MULTIVITAMIN) TABS tablet, Take 1 tablet by mouth daily at 12 noon. (Patient not taking: Reported on 09/06/2017), Disp: 30 tablet, Rfl: 2 .  senna-docusate (SENOKOT-S) 8.6-50 MG tablet, Take 1 tablet by mouth at bedtime. (Patient not taking: Reported on 09/06/2017), Disp: 30 tablet, Rfl: 1  The following portions of the patient's history were reviewed and updated as appropriate: allergies, current medications, past family history, past medical history, past social history, past surgical history and problem list.   Health Maintenance:  Last pap: as above Last mammogram: na  Review of Systems:  Pertinent items noted in HPI and remainder of comprehensive ROS otherwise negative.   Objective:  Physical Exam BP (!) 135/96   Pulse 74   Ht 5\' 4"  (1.626 m)   Wt 222 lb 9.6 oz (101 kg)   LMP  (LMP Unknown) Comment: preg test neg  Breastfeeding? No   BMI 38.21 kg/m  CONSTITUTIONAL: Well-developed, well-nourished female in no acute distress.  HENT:  Normocephalic, atraumatic. External right and left ear normal. Oropharynx is clear and moist EYES: Conjunctivae and EOM are normal. Pupils are equal, round, and reactive to light. No scleral icterus.  NECK: Normal range of motion, supple, no masses SKIN: Skin is warm and dry. No rash noted. Not diaphoretic. No erythema. No pallor. NEUROLOGIC:  Alert and oriented to person, place, and time. Normal reflexes, muscle tone coordination. No cranial nerve deficit noted. PSYCHIATRIC: Normal mood and affect. Normal behavior. Normal judgment and thought content. CARDIOVASCULAR: Normal heart rate noted RESPIRATORY: Effort and breath sounds normal, no problems with respiration noted ABDOMEN: Soft, no distention noted.   PELVIC: normal appearing external female genitalia, some discharge noted at introitus MUSCULOSKELETAL: Normal range of  motion. No edema noted.  Labs and Imaging No results found.  Assessment & Plan:  1. Vaginal discharge - Cervicovaginal ancillary only  2. CIN II (cervical intraepithelial neoplasia II) Reviewed findings on pap/colpo. Reviewed diagnosis and prognosis. Reviewed options for management of CINII including cryotherapy and LEEP. Reviewed risks/benefits of both including cryotherapy being less invasive but also not a excisional procedure and possible need for repeat excisional procedure. Reviewed that LEEP is more invasive procedure but more definitive in that it removes abnormal tissue. Reviewed risks including infection, hemorrhage, damage to surrounding tissues and organs. Patient opts for LEEP but does not want procedure done today. Instructions reviewed, she will return for procedure.  3. Abnormal uterine bleeding (AUB) S/p depo with spotting Reviewed that best option for spotting is levonorgestrel IUD, to which she is agreeable Will plan for placement after LEEP   Routine preventative health maintenance measures emphasized. Please refer to After Visit Summary for other counseling recommendations.   Return in about 2 weeks (around 09/20/2017). for LEEP    K. Therese SarahMeryl Gabbert, M.D. Attending Obstetrician & Gynecologist, [facility], [facility]

## 2017-09-07 LAB — CERVICOVAGINAL ANCILLARY ONLY
BACTERIAL VAGINITIS: NEGATIVE
Candida vaginitis: POSITIVE — AB
Trichomonas: NEGATIVE

## 2017-09-08 ENCOUNTER — Other Ambulatory Visit: Payer: Self-pay | Admitting: Obstetrics and Gynecology

## 2017-09-08 MED ORDER — FLUCONAZOLE 150 MG PO TABS: 150.0000 mg | ORAL_TABLET | Freq: Once | ORAL | 0 refills | Status: AC

## 2017-09-08 NOTE — Progress Notes (Signed)
d 

## 2017-09-14 ENCOUNTER — Telehealth: Payer: Self-pay | Admitting: Internal Medicine

## 2017-09-14 NOTE — Telephone Encounter (Signed)
LMOVM for pt to call us back. Please see message below. Keijuan Schellhase, CMA  

## 2017-09-14 NOTE — Telephone Encounter (Signed)
Please have patient call gynecologist who has been managing this issue for her.

## 2017-09-14 NOTE — Telephone Encounter (Signed)
Pt called concerned with her menstrual cycle. She said she stopped doing the depo in October and ever since she has been spotting and her periods are very irregular. Pt said when she does have a period the bleeding is really bad. She is wanting to know if there is something dr can prescribe her to slow down or stop the bleeding Please advise

## 2017-09-28 ENCOUNTER — Ambulatory Visit: Payer: Medicaid Other | Admitting: Obstetrics and Gynecology

## 2017-10-24 ENCOUNTER — Ambulatory Visit: Payer: Medicaid Other

## 2017-12-04 ENCOUNTER — Ambulatory Visit: Payer: Medicaid Other | Admitting: Internal Medicine

## 2017-12-06 ENCOUNTER — Ambulatory Visit: Payer: Medicaid Other | Admitting: Family Medicine

## 2018-01-08 ENCOUNTER — Encounter: Payer: Self-pay | Admitting: *Deleted

## 2018-01-10 ENCOUNTER — Ambulatory Visit (INDEPENDENT_AMBULATORY_CARE_PROVIDER_SITE_OTHER): Payer: Medicaid Other | Admitting: Obstetrics & Gynecology

## 2018-01-10 ENCOUNTER — Other Ambulatory Visit (HOSPITAL_COMMUNITY)
Admission: RE | Admit: 2018-01-10 | Discharge: 2018-01-10 | Disposition: A | Discharge status: Home / Self Care | Payer: Medicaid Other | Source: Ambulatory Visit | Attending: Obstetrics & Gynecology | Admitting: Obstetrics & Gynecology

## 2018-01-10 ENCOUNTER — Encounter: Payer: Self-pay | Admitting: Obstetrics & Gynecology

## 2018-01-10 VITALS — BP 135/89 | HR 83 | Wt 224.1 lb

## 2018-01-10 DIAGNOSIS — N898 Other specified noninflammatory disorders of vagina: Secondary | ICD-10-CM | POA: Insufficient documentation

## 2018-01-10 DIAGNOSIS — N871 Moderate cervical dysplasia: Secondary | ICD-10-CM | POA: Diagnosis not present

## 2018-01-10 DIAGNOSIS — Z3202 Encounter for pregnancy test, result negative: Secondary | ICD-10-CM | POA: Diagnosis not present

## 2018-01-10 LAB — POCT PREGNANCY, URINE: Preg Test, Ur: NEGATIVE

## 2018-01-10 MED ORDER — METRONIDAZOLE 500 MG PO TABS: 500.0000 mg | ORAL_TABLET | Freq: Two times a day (BID) | ORAL | 0 refills | Status: AC

## 2018-01-10 MED ORDER — NORGESTIM-ETH ESTRAD TRIPHASIC 0.18/0.215/0.25 MG-25 MCG PO TABS: 1.0000 | ORAL_TABLET | Freq: Every day | ORAL | 11 refills | Status: DC

## 2018-01-10 NOTE — Patient Instructions (Addendum)
Loop Electrosurgical Excision Procedure, Care After  Refer to this sheet in the next few weeks. These instructions provide you with information about caring for yourself after your procedure. Your health care provider may also give you more specific instructions. Your treatment has been planned according to current medical practices, but problems sometimes occur. Call your health care provider if you have any problems or questions after your procedure.  What can I expect after the procedure?  After the procedure, it is common to have:  · Abdominal cramps that are similar to menstrual cramps. These may last for up to 1 week.  · Pink-tinged or bloody vaginal discharge, including light to moderate bleeding, for 1-2 weeks.  · A dark-colored vaginal discharge. This is from the paste that was applied to your cervix to control bleeding.    Follow these instructions at home:  Activity  · Return to your normal activities as told by your health care provider. Ask your health care provider what activities are safe for you.  · Avoid strenuous physical activity for as long as told by your health care provider.  · Do not lift anything that is heavier than 10 lb (4.5 kg) until your health care provider says that it is safe.  Bathing  · Do not take baths, swim, or use a hot tub until your health care provider approves.  · You may take showers.  Lifestyle  · Do not put anything in your vagina for 2 weeks after the procedure or until your health care provider says that it is okay. This includes tampons, creams, and douches.  · Do not have sexual intercourse until your health care provider approves.  General instructions  · Take over-the-counter and prescription medicines only as told by your health care provider.  · Keep all follow-up visits as told by your health care provider. This is important.  Contact a health care provider if:  · You have a fever or chills.  · You feel unusually weak.  · You have vaginal bleeding that is  heavier or longer than a normal menstrual cycle. A sign of this can be soaking a pad with blood.  · You have severe pain.  · You have nausea or vomiting.  · You develop a bad smelling vaginal discharge.  This information is not intended to replace advice given to you by your health care provider. Make sure you discuss any questions you have with your health care provider.  Document Released: 06/08/2011 Document Revised: 10/22/2015 Document Reviewed: 08/09/2015  Elsevier Interactive Patient Education © 2018 Elsevier Inc.

## 2018-01-10 NOTE — Progress Notes (Signed)
Cc: abnormal pap [id number] Patient's last menstrual period was 12/28/2017 (approximate). She requests OCP and BV treatment  Patient identified, informed consent obtained, signed copy in chart, time out performed.  Pap smear and colposcopy reviewed.   Pap LSIL Colpo Biopsy CIN 2 ECC negative Teflon coated speculum with smoke evacuator placed.  Cervix visualized. Paracervical block placed.  Medium Fischer size loop used to remove cone of cervix using blend of cut and cautery on LEEP machine.  Edges/Base cauterized with Ball.  Monsel's solution used for hemostasis.  Patient tolerated procedure well.  Patient given post procedure instructions.  Follow up in 12 months for repeat pap or as needed. Tri Sprnitec and Flagyl prescribed  Adam PhenixArnold, James G, MD 01/10/2018

## 2018-01-11 LAB — CERVICOVAGINAL ANCILLARY ONLY
BACTERIAL VAGINITIS: POSITIVE — AB
Candida vaginitis: NEGATIVE
Chlamydia: NEGATIVE
Neisseria Gonorrhea: NEGATIVE
Trichomonas: NEGATIVE

## 2018-01-14 ENCOUNTER — Encounter: Payer: Self-pay | Admitting: *Deleted

## 2018-01-22 ENCOUNTER — Other Ambulatory Visit: Payer: Self-pay

## 2018-01-22 DIAGNOSIS — F4321 Adjustment disorder with depressed mood: Secondary | ICD-10-CM

## 2018-01-23 MED ORDER — CITALOPRAM HYDROBROMIDE 10 MG PO TABS: 10.0000 mg | ORAL_TABLET | Freq: Every day | ORAL | 0 refills | Status: DC

## 2018-02-05 ENCOUNTER — Telehealth: Payer: Self-pay

## 2018-02-05 NOTE — Telephone Encounter (Signed)
Called Pt.to advise of Leep results, advised pt to have another Pap in a year. Pt verbalized understanding.

## 2018-04-18 ENCOUNTER — Ambulatory Visit: Payer: Medicaid Other | Admitting: Student in an Organized Health Care Education/Training Program

## 2018-07-28 ENCOUNTER — Other Ambulatory Visit: Payer: Self-pay | Admitting: Internal Medicine

## 2018-07-28 DIAGNOSIS — I10 Essential (primary) hypertension: Secondary | ICD-10-CM

## 2018-07-30 ENCOUNTER — Encounter: Payer: Self-pay | Admitting: Student in an Organized Health Care Education/Training Program

## 2018-10-25 ENCOUNTER — Other Ambulatory Visit (HOSPITAL_COMMUNITY)
Admission: RE | Admit: 2018-10-25 | Discharge: 2018-10-25 | Disposition: A | Discharge status: Home / Self Care | Payer: No Typology Code available for payment source | Source: Ambulatory Visit | Attending: Family Medicine | Admitting: Family Medicine

## 2018-10-25 ENCOUNTER — Ambulatory Visit (INDEPENDENT_AMBULATORY_CARE_PROVIDER_SITE_OTHER): Payer: No Typology Code available for payment source | Admitting: Family Medicine

## 2018-10-25 ENCOUNTER — Ambulatory Visit: Payer: Self-pay | Admitting: Family Medicine

## 2018-10-25 ENCOUNTER — Other Ambulatory Visit: Payer: Self-pay

## 2018-10-25 VITALS — BP 114/86 | HR 70 | Temp 98.4°F | Ht 64.0 in | Wt 212.4 lb

## 2018-10-25 DIAGNOSIS — N898 Other specified noninflammatory disorders of vagina: Secondary | ICD-10-CM

## 2018-10-25 DIAGNOSIS — Z1159 Encounter for screening for other viral diseases: Secondary | ICD-10-CM | POA: Diagnosis not present

## 2018-10-25 LAB — POCT WET PREP (WET MOUNT): Clue Cells Wet Prep Whiff POC: POSITIVE

## 2018-10-25 LAB — POCT URINALYSIS DIP (MANUAL ENTRY)
Glucose, UA: NEGATIVE mg/dL
Ketones, POC UA: NEGATIVE mg/dL
Nitrite, UA: NEGATIVE
Spec Grav, UA: 1.015 (ref 1.010–1.025)
Urobilinogen, UA: 2 E.U./dL — AB
pH, UA: 7.5 (ref 5.0–8.0)

## 2018-10-25 LAB — POCT URINE PREGNANCY: Preg Test, Ur: NEGATIVE

## 2018-10-25 MED ORDER — METRONIDAZOLE 500 MG PO TABS: 2000.0000 mg | ORAL_TABLET | Freq: Once | ORAL | 0 refills | Status: DC

## 2018-10-25 MED ORDER — METRONIDAZOLE 500 MG PO TABS: 2000.0000 mg | ORAL_TABLET | Freq: Once | ORAL | 0 refills | Status: AC

## 2018-10-25 MED FILL — metroNIDAZOLE 500 MG TABS: 500 | 1 days supply | Qty: 4 | Fill #0

## 2018-10-25 NOTE — Patient Instructions (Signed)
It was a pleasure to see you today! Thank you for choosing [facility] for your primary care. Christine Cervantes was seen for discharge.   Our plans for today were:  You have an infection that needs medicine - we sent it to the pharmacy. Abstain from sex for 1 week. We will call you with the other results.   Best,  Dr. Chanetta Marshall

## 2018-10-25 NOTE — Progress Notes (Signed)
   CC: Vaginal discharge  HPI  Hx of BV relatively frequently. Wants STI checks. Liked the OCPs that she used previously. Doesn't want IUD. Men - no new partners.  Would like blood testing for HIV and RPR.  Safe in her relationship.  She is no longer with her last partner.  Currently on her period.  ROS: Denies CP, SOB, abdominal pain, dysuria, changes in BMs.   CC, SH/smoking status, and VS noted  Objective: BP 114/86   Pulse 70   Temp 98.4 F (36.9 C) (Oral)   Ht 5\' 4"  (1.626 m)   Wt 212 lb 6.4 oz (96.3 kg)   LMP 10/24/2018 (Exact Date)   SpO2 98%   BMI 36.46 kg/m  Gen: NAD, alert, cooperative, and pleasant. HEENT: NCAT, EOMI, PERRL CV: RRR, no murmur Resp: CTAB, no wheezes, non-labored Abd: SNTND, BS present, no guarding or organomegaly GU: Normal vulvar skin, normal introitus, no adnexal or cervical motion tenderness. Neuro: Alert and oriented, Speech clear, No gross deficits  Assessment and plan:  Vaginal discharge: Wet prep with trichomonas.  Will treat with 2 g metronidazole once.  Patient counseled to abstain from sex and notify any recent partners.  Will perform HIV and RPR testing at patient request.  Patient initially mentioned dysuria to the nurse, thus UA was ordered without signs of infection.  Orders Placed This Encounter  Procedures  . HIV Antibody (routine testing w rflx)  . RPR  . POCT urine pregnancy  . POCT Wet Prep [employer])  . POCT urinalysis dipstick    Meds ordered this encounter  Medications  . DISCONTD: metroNIDAZOLE (FLAGYL) 500 MG tablet    Sig: Take 4 tablets (2,000 mg total) by mouth once for 1 dose.    Dispense:  4 tablet    Refill:  0  . metroNIDAZOLE (FLAGYL) 500 MG tablet    Sig: Take 4 tablets (2,000 mg total) by mouth once for 1 dose.    Dispense:  4 tablet    Refill:  0   Loni Muse, MD, PGY3 10/28/2018 10:36 AM

## 2018-10-26 LAB — HIV ANTIBODY (ROUTINE TESTING W REFLEX): HIV Screen 4th Generation wRfx: NONREACTIVE

## 2018-10-26 LAB — RPR: RPR Ser Ql: NONREACTIVE

## 2018-10-28 ENCOUNTER — Telehealth: Payer: Self-pay

## 2018-10-28 LAB — CERVICOVAGINAL ANCILLARY ONLY
Chlamydia: NEGATIVE
Neisseria Gonorrhea: NEGATIVE

## 2018-10-28 NOTE — Telephone Encounter (Signed)
LVM for pt to call the office. If pt calls, please give her the information below. Sharon T Saunders, CMA  

## 2018-10-28 NOTE — Telephone Encounter (Signed)
Patient aware. Alisa Brake, RN ([facility] RN)  

## 2018-10-28 NOTE — Telephone Encounter (Signed)
-----   Message from Garth Bigness, MD sent at 10/28/2018 10:39 AM EST ----- Please let patient know her HIV and RPR tests were negative, meaning she has no infection other than the trichomonas that we talked about last week.

## 2018-11-11 ENCOUNTER — Encounter: Payer: Self-pay | Admitting: Student in an Organized Health Care Education/Training Program

## 2018-11-12 ENCOUNTER — Ambulatory Visit: Payer: No Typology Code available for payment source

## 2018-11-13 ENCOUNTER — Ambulatory Visit: Payer: No Typology Code available for payment source

## 2018-11-15 ENCOUNTER — Ambulatory Visit: Payer: No Typology Code available for payment source | Admitting: Family Medicine

## 2018-11-20 ENCOUNTER — Encounter: Payer: Self-pay | Admitting: Student in an Organized Health Care Education/Training Program

## 2018-11-20 ENCOUNTER — Ambulatory Visit (INDEPENDENT_AMBULATORY_CARE_PROVIDER_SITE_OTHER)
Payer: No Typology Code available for payment source | Admitting: Student in an Organized Health Care Education/Training Program

## 2018-11-20 ENCOUNTER — Other Ambulatory Visit: Payer: Self-pay

## 2018-11-20 VITALS — BP 125/80 | HR 70 | Temp 97.9°F | Wt 213.0 lb

## 2018-11-20 DIAGNOSIS — Z8739 Personal history of other diseases of the musculoskeletal system and connective tissue: Secondary | ICD-10-CM

## 2018-11-20 DIAGNOSIS — D508 Other iron deficiency anemias: Secondary | ICD-10-CM

## 2018-11-20 DIAGNOSIS — I10 Essential (primary) hypertension: Secondary | ICD-10-CM

## 2018-11-20 DIAGNOSIS — Z8619 Personal history of other infectious and parasitic diseases: Secondary | ICD-10-CM

## 2018-11-20 DIAGNOSIS — F4321 Adjustment disorder with depressed mood: Secondary | ICD-10-CM

## 2018-11-20 DIAGNOSIS — N871 Moderate cervical dysplasia: Secondary | ICD-10-CM

## 2018-11-20 DIAGNOSIS — M546 Pain in thoracic spine: Secondary | ICD-10-CM

## 2018-11-20 DIAGNOSIS — Z113 Encounter for screening for infections with a predominantly sexual mode of transmission: Secondary | ICD-10-CM

## 2018-11-20 DIAGNOSIS — G8929 Other chronic pain: Secondary | ICD-10-CM

## 2018-11-20 LAB — POCT WET PREP (WET MOUNT): Clue Cells Wet Prep Whiff POC: POSITIVE

## 2018-11-20 MED ORDER — HYDROCHLOROTHIAZIDE 12.5 MG PO TABS: 12.5000 mg | ORAL_TABLET | Freq: Every day | ORAL | 0 refills | Status: DC

## 2018-11-20 MED ORDER — METRONIDAZOLE 500 MG PO TABS: 500.0000 mg | ORAL_TABLET | Freq: Two times a day (BID) | ORAL | 0 refills | Status: AC

## 2018-11-20 MED FILL — metroNIDAZOLE 500 MG TABS: 500 | 7 days supply | Qty: 14 | Fill #0

## 2018-11-20 NOTE — Patient Instructions (Signed)
It was a pleasure to see you today!  To summarize our discussion for this visit:  Refill BP medication and recheck in a week or two  Referral to plastic surgeon  Blood work to check anemia today  Retreated for trich and BV with flagyl twice a day for a week  Some additional health maintenance measures we should update are: . Pap smear in April   Please return to our clinic to see me in 1-2 weeks for recheck.  Call the clinic at [phone] if your symptoms worsen or you have any concerns.  Thank you for allowing me to take part in your care,  Dr. Jamelle Rushing   Thanks for choosing [facility] for your primary care.

## 2018-11-20 NOTE — Progress Notes (Signed)
Subjective:    Patient ID: Christine Cervantes, female    DOB: [birth year], 33 y.o.   MRN: [id number]   CC: referral to plastic surgeon for breast reduction, BP check, trich recheck  HPI: Back pain- chronic- patient endorses many years of upper back and shoulder pain that is believed to be 2/2 breast size. She has been seen by plastic surgeons for reduction consultations twice in the past but insurance was an issue paying for the procedure. She now has a new insurance so would like a referral. She has tried to stretch her back with little relief. She is open to back muscle exercises. She takes ibuprofen every other day which improves pain, tylenol does not help. She has embarrassment at work 2/2 ill-fitting bras and breasts spilling out of scrub top. She has difficulty breathing when laying on her back 2/2 the weight her breasts place on her chest. She is unable to perform a lot of physical activities 2/2 pain and discomfort from her breast size. She has associated depression which she believes is contributed to by her decreased ability to participate with activities 2/2 her breast size.  HTN- Patient has been out of her BP medication for 3 days. When she does not take her medication, she feels like she gets a wet cough and increased LE swelling. She needs a refill today.  Anemia- patient has a history of anemia. She states that she does feel tired. Denies lightheadedness when standing, chest pain, pale skin, or heart palpitations. She would like to check her blood cells today. She has never taken iron.  Women's health- patient had loop in April 2019 at OB/GYN. She is due for repeat pap this April so I recommended she schedule it with me or with the OB/GYN again. She recently had a trich infection which she was seen and treated for in our clinic. When she took the treatment, she vomited so did not complete treatment. She performed a self swab this appointment.  Smoking status reviewed   ROS: pertinent  noted in the HPI   Past Medical History:  Diagnosis Date  . Abnormal Pap smear    cryo on cervix  . Anemia   . Chlamydia   . Depression   . Gonorrhea   . Headache(784.0)   . Pregnancy induced hypertension    Chronic hypertension  . Preterm labor   . Trichimoniasis   . Urinary tract infection   . Vaginal Pap smear, abnormal     Past Surgical History:  Procedure Laterality Date  . FOOT SURGERY  2004-2004   corns removed from both feet, hammertoes repaired  . GYNECOLOGIC CRYOSURGERY      Past medical history, surgical, family, and social history reviewed and updated in the EMR as appropriate.  Objective:  BP 125/80   Pulse 70   Temp 97.9 F (36.6 C) (Oral)   Wt 213 lb (96.6 kg)   LMP 11/15/2018   SpO2 99%   BMI 36.56 kg/m   Vitals and nursing note reviewed  General: NAD, pleasant, able to participate in exam Eyes: positive for conjunctival pallor Back: patient has poor posture with excessive thoracic kyphosis.  Chest: asymmetrical breasts- Left larger than Right Extremities: no edema or cyanosis. Gait: patient has algesic gait with mild varus valgus Skin: warm and dry, no rashes noted Neuro: alert, no obvious focal deficits Psych: Normal affect and mood   Assessment & Plan:    History of chronic back pain Chronic cervical and thoracic back pain  2/2 excessive anterior weight pull from breast tissue Refer patient to plastic surgery to discuss reduction Recommend back exercises Recommend purchasing a more supportive and better fitting bra   Essential hypertension Patient intermittently adherent to tx 125/80 today Refill medication for symptomatic relief Patient BP likely still effected by medication but would like to recheck BP soon on medication to ensure not lowering BP too much with medication   Anemia, iron deficiency CBC today showed Hgb and MCV within normal range Will not supplement with iron Depression and decreased activity can be contributing  to fatigue so will address those issues more at next appointment  Depression Adherent to celexa 10mg  Address at appointment 2/24  CIN II (cervical intraepithelial neoplasia II) Patient had LEEP 01/2018. Will repeat PAP this year 3/12  Routine screening for STI (sexually transmitted infection) Patient self-swabbed for wet prep Did not complete previous treatment for trich 2/2 vomiting the treatment Wet prep positive for BV and trich this visit.  Prescribed flagyl BID x7 days and recommended taking with food and no alcohol    Jamelle Rushinghelsey Anderson, DO [facility] PGY-1

## 2018-11-21 DIAGNOSIS — Z8739 Personal history of other diseases of the musculoskeletal system and connective tissue: Secondary | ICD-10-CM | POA: Insufficient documentation

## 2018-11-21 DIAGNOSIS — Z113 Encounter for screening for infections with a predominantly sexual mode of transmission: Secondary | ICD-10-CM | POA: Insufficient documentation

## 2018-11-21 LAB — CBC
Hematocrit: 36 % (ref 34.0–46.6)
Hemoglobin: 11.2 g/dL (ref 11.1–15.9)
MCH: 25.1 pg — ABNORMAL LOW (ref 26.6–33.0)
MCHC: 31.1 g/dL — ABNORMAL LOW (ref 31.5–35.7)
MCV: 81 fL (ref 79–97)
Platelets: 238 10*3/uL (ref 150–450)
RBC: 4.47 x10E6/uL (ref 3.77–5.28)
RDW: 12.3 % (ref 11.7–15.4)
WBC: 4.2 10*3/uL (ref 3.4–10.8)

## 2018-11-21 NOTE — Assessment & Plan Note (Signed)
Patient had LEEP 01/2018. Will repeat PAP this year 3/12

## 2018-11-21 NOTE — Assessment & Plan Note (Signed)
Adherent to celexa 10mg  Address at appointment 2/24

## 2018-11-21 NOTE — Assessment & Plan Note (Signed)
Patient self-swabbed for wet prep Did not complete previous treatment for trich 2/2 vomiting the treatment Wet prep positive for BV and trich this visit.  Prescribed flagyl BID x7 days and recommended taking with food and no alcohol

## 2018-11-21 NOTE — Assessment & Plan Note (Signed)
CBC today showed Hgb and MCV within normal range Will not supplement with iron Depression and decreased activity can be contributing to fatigue so will address those issues more at next appointment

## 2018-11-21 NOTE — Assessment & Plan Note (Signed)
Chronic cervical and thoracic back pain 2/2 excessive anterior weight pull from breast tissue Refer patient to plastic surgery to discuss reduction Recommend back exercises Recommend purchasing a more supportive and better fitting bra

## 2018-11-21 NOTE — Assessment & Plan Note (Signed)
Patient intermittently adherent to tx 125/80 today Refill medication for symptomatic relief Patient BP likely still effected by medication but would like to recheck BP soon on medication to ensure not lowering BP too much with medication

## 2018-12-02 ENCOUNTER — Ambulatory Visit
Payer: No Typology Code available for payment source | Admitting: Student in an Organized Health Care Education/Training Program

## 2018-12-10 ENCOUNTER — Ambulatory Visit (INDEPENDENT_AMBULATORY_CARE_PROVIDER_SITE_OTHER): Payer: No Typology Code available for payment source | Admitting: Plastic Surgery

## 2018-12-10 ENCOUNTER — Encounter: Payer: Self-pay | Admitting: Plastic Surgery

## 2018-12-10 DIAGNOSIS — G8929 Other chronic pain: Secondary | ICD-10-CM

## 2018-12-10 DIAGNOSIS — M542 Cervicalgia: Secondary | ICD-10-CM

## 2018-12-10 DIAGNOSIS — M546 Pain in thoracic spine: Secondary | ICD-10-CM | POA: Diagnosis not present

## 2018-12-10 DIAGNOSIS — M549 Dorsalgia, unspecified: Secondary | ICD-10-CM | POA: Insufficient documentation

## 2018-12-10 NOTE — Progress Notes (Signed)
Patient ID: Christine Cervantes, female    DOB: 11-06-1985, 33 y.o.   MRN: [id number]   Chief Complaint  Patient presents with  . Advice Only    for breast reduction    Mammary Hyperplasia: The patient is a 33 y.o. female with a history of mammary hyperplasia for several years.  She has extremely large breasts causing symptoms that include the following: Back pain (upper and lower) and neck pain. She frequently pins bra cups higher on straps for better lift and relief. Notices relief when holding breast up in her hands. Shoulder straps causing grooves, pain occasionally requiring padding. Pain medication is sometimes required with motrin and tylenol.  Activities that are hindered by enlarged breasts include: Exercise and running.  Her breasts are extremely large.  The left breast is slightly larger than the right. she has hyperpigmentation of the inframammary area on both sides.  The sternal to nipple distance on the right is 35 cm and the left is 37 cm.  The nipple areole a distance is 9 cm.  Her chest circumference is 94 cm.  The IMF distance is 20 cm.  She is 5 feet 4 inches tall and weighs 213 pounds.  Preoperative bra size = 36 H cup.  She would like to be a D cup.  the estimated excess breast tissue to be removed at the time of surgery = 800 grams on the left and 800 grams on the right.  Mammogram history: None and no family history of breast cancer.  The patient was approved several years ago for a breast reduction with Dr. Shon Hough but had to postpone the surgery and then again 2 years ago with Dr. Odis Luster.  She does not have diabetes.   Review of Systems  Constitutional: Negative.  Negative for appetite change and chills.  Eyes: Negative.   Respiratory: Negative for chest tightness and shortness of breath.   Cardiovascular: Negative for leg swelling.  Gastrointestinal: Negative for abdominal pain.  Endocrine: Negative.   Genitourinary: Negative.   Musculoskeletal: Positive for back  pain and neck pain.  Hematological: Negative.   Psychiatric/Behavioral: Negative.     Past Medical History:  Diagnosis Date  . Abnormal Pap smear    cryo on cervix  . Anemia   . Chlamydia   . Depression   . Gonorrhea   . Headache(784.0)   . Pregnancy induced hypertension    Chronic hypertension  . Preterm labor   . Trichimoniasis   . Urinary tract infection   . Vaginal Pap smear, abnormal     Past Surgical History:  Procedure Laterality Date  . FOOT SURGERY  2004-2004   corns removed from both feet, hammertoes repaired  . GYNECOLOGIC CRYOSURGERY        Current Outpatient Medications:  .  ibuprofen (ADVIL,MOTRIN) 600 MG tablet, Take 1 tablet (600 mg total) by mouth every 6 (six) hours as needed for cramping., Disp: 30 tablet, Rfl: 2 .  Multiple Vitamin (MULTIVITAMIN WITH MINERALS) TABS tablet, Take 1 tablet by mouth daily., Disp: , Rfl:  .  citalopram (CELEXA) 10 MG tablet, Take 1 tablet (10 mg total) by mouth daily. (Patient not taking: Reported on 12/10/2018), Disp: 90 tablet, Rfl: 0 .  Norgestimate-Ethinyl Estradiol Triphasic 0.18/0.215/0.25 MG-25 MCG tab, Take 1 tablet by mouth daily. (Patient not taking: Reported on 12/10/2018), Disp: 1 Package, Rfl: 11 .  Prenatal Vit-Fe Fumarate-FA (PRENATAL MULTIVITAMIN) TABS tablet, Take 1 tablet by mouth daily at 12 noon. (Patient not taking:  Reported on 09/06/2017), Disp: 30 tablet, Rfl: 2 .  senna-docusate (SENOKOT-S) 8.6-50 MG tablet, Take 1 tablet by mouth at bedtime. (Patient not taking: Reported on 09/06/2017), Disp: 30 tablet, Rfl: 1   Objective:   Vitals:   12/10/18 1446  BP: 127/84  Pulse: 76  Temp: 98.6 F (37 C)  SpO2: 98%    Physical Exam Vitals signs and nursing note reviewed.  Constitutional:      Appearance: Normal appearance.  HENT:     Head: Normocephalic and atraumatic.     Nose: Nose normal.  Eyes:     Extraocular Movements: Extraocular movements intact.     Pupils: Pupils are equal, round, and reactive  to light.  Cardiovascular:     Rate and Rhythm: Normal rate.     Pulses: Normal pulses.  Pulmonary:     Effort: Pulmonary effort is normal.  Abdominal:     General: Abdomen is flat. There is no distension.     Tenderness: There is no abdominal tenderness.  Skin:    General: Skin is warm.  Neurological:     General: No focal deficit present.     Mental Status: She is alert.  Psychiatric:        Mood and Affect: Mood normal.        Behavior: Behavior normal.        Thought Content: Thought content normal.        Judgment: Judgment normal.     Assessment & Plan:  Neck pain  Chronic bilateral thoracic back pain  Recommend mammogram prior to surgery.  I think she would be a good candidate for bilateral breast reduction.  Christine Cervantes [name], DO

## 2018-12-19 ENCOUNTER — Encounter: Payer: Self-pay | Admitting: Student in an Organized Health Care Education/Training Program

## 2018-12-19 ENCOUNTER — Ambulatory Visit
Payer: No Typology Code available for payment source | Admitting: Student in an Organized Health Care Education/Training Program

## 2018-12-24 ENCOUNTER — Encounter: Payer: Self-pay | Admitting: Student in an Organized Health Care Education/Training Program

## 2019-01-07 ENCOUNTER — Ambulatory Visit
Payer: No Typology Code available for payment source | Admitting: Student in an Organized Health Care Education/Training Program

## 2019-01-07 ENCOUNTER — Other Ambulatory Visit: Payer: Self-pay

## 2019-01-07 ENCOUNTER — Telehealth (INDEPENDENT_AMBULATORY_CARE_PROVIDER_SITE_OTHER): Payer: No Typology Code available for payment source | Admitting: Family Medicine

## 2019-01-07 ENCOUNTER — Telehealth: Payer: Self-pay | Admitting: Family Medicine

## 2019-01-07 DIAGNOSIS — I10 Essential (primary) hypertension: Secondary | ICD-10-CM

## 2019-01-07 DIAGNOSIS — N62 Hypertrophy of breast: Secondary | ICD-10-CM | POA: Diagnosis not present

## 2019-01-07 NOTE — Assessment & Plan Note (Signed)
At patient's last office visit on 11/21/2018, her BP was 125/80.  Patient did not properly check her blood pressure when it was 137/95 as she was standing.  Explained this to the patient.  Also advised her that she should not take her blood pressure medication as needed.  She should either commit to taking the medication or not take it.  Advised her that the next time she is at work, which she states will be in 2 days, she should take her blood pressure after she has been sitting for about 5 minutes.  Advised her to have someone else do this for her.  Also advised her that she should have another one recorded the next day in the same manner.  She is to call the office with those results.  At this time it can be discussed whether or not the patient would need to be put on a blood pressure medication. -Check BP x2 at 2 separate times at work while sitting x5 minutes

## 2019-01-07 NOTE — Assessment & Plan Note (Signed)
Patient requesting mammogram order per plastic surgeons recommendations for possible breast reduction. -Ordered bilateral screening mammogram -Patient sent a MyChart message with the number for the breast center to contact them about scheduling

## 2019-01-07 NOTE — Progress Notes (Signed)
[facility] Telemedicine Visit  Patient consented to have visit conducted via telephone.  Encounter participants: Patient: Christine Cervantes  Provider: Solmon Ice   Others (if applicable): Dr. Leveda Anna  Chief Complaint: Requesting Mammogram, BP meds  HPI: Patient has telephone encounter scheduled for today to request a mammogram and to talk about restarting blood pressure medicines.  Patient states that she was told by a plastic surgeon that she needs a referral to have a mammogram, because she is considering having breast reduction surgery.  She states that she is having the surgery because she has lost a lot of weight, and she has pain from her breasts being so large.  She denies any other breast complaints at this time.  Per chart review, patient has been on hydrochlorothiazide 12.5 mg in the past, but it does not appear that she takes this regularly.  She states that she stopped taking it a little over a month ago on her own.  She states that she thinks that she needs to get back on it because "I can tell when I have higher blood pressure."  She states that she checked her blood pressure 4 days ago while she was at work and it was 137/95, but she states that she was standing at the time.  She states that she is "not feeling her best."  She denies any headaches, but states that she just feels somewhat fatigued.  She denies any fever, shortness of breath, cough.  ROS: Per HPI  Pertinent PMHx: Hypertension, chronic back pain  Exam:  Respiratory: Patient is able to speak in complete sentences while on the phone does not appear to be in any respiratory distress.  Assessment/Plan:  Macromastia Patient requesting mammogram order per plastic surgeons recommendations for possible breast reduction. -Ordered bilateral screening mammogram -Patient sent a MyChart message with the number for the breast center to contact them about scheduling  Essential hypertension At  patient's last office visit on 11/21/2018, her BP was 125/80.  Patient did not properly check her blood pressure when it was 137/95 as she was standing.  Explained this to the patient.  Also advised her that she should not take her blood pressure medication as needed.  She should either commit to taking the medication or not take it.  Advised her that the next time she is at work, which she states will be in 2 days, she should take her blood pressure after she has been sitting for about 5 minutes.  Advised her to have someone else do this for her.  Also advised her that she should have another one recorded the next day in the same manner.  She is to call the office with those results.  At this time it can be discussed whether or not the patient would need to be put on a blood pressure medication. -Check BP x2 at 2 separate times at work while sitting x5 minutes    Time spent on phone with patient: 12 minutes

## 2019-01-07 NOTE — Telephone Encounter (Signed)
Attempted to call patient as she was scheduled for a telemedicine visit today.  Patient did not answer.  Left voicemail for patient to call the clinic if she still needs an encounter.

## 2019-01-23 ENCOUNTER — Other Ambulatory Visit: Payer: Self-pay

## 2019-01-23 ENCOUNTER — Telehealth (INDEPENDENT_AMBULATORY_CARE_PROVIDER_SITE_OTHER)
Payer: No Typology Code available for payment source | Admitting: Student in an Organized Health Care Education/Training Program

## 2019-01-23 DIAGNOSIS — J3089 Other allergic rhinitis: Secondary | ICD-10-CM | POA: Diagnosis not present

## 2019-01-23 MED ORDER — FLUTICASONE PROPIONATE 50 MCG/ACT NA SUSP: 2.0000 | Freq: Every day | NASAL | 6 refills | Status: DC

## 2019-01-23 MED ORDER — LORATADINE-PSEUDOEPHEDRINE ER 5-120 MG PO TB12: 1.0000 | ORAL_TABLET | Freq: Two times a day (BID) | ORAL | 0 refills | Status: DC

## 2019-01-23 NOTE — Assessment & Plan Note (Addendum)
Afebrile, no cough, no sick contacts Associated with known allergen contact Provided with flonase and claritin prescription

## 2019-01-23 NOTE — Progress Notes (Signed)
[facility] Telemedicine Visit  Patient consented to have virtual visit. Method of visit: Telephone per patient preference (phone battery about to die)  Encounter participants: Patient: Christine Cervantes - located at home Provider: Leeroy Bock - located at [facility] Others (if applicable): her young son  Chief Complaint: seasonal allergies  HPI: Patient has been doing a lot of yard work- mowing and planting garden the past week which has increased her seasonal allergies. Primarily her concern is sneezing. She also endorses nasal congestion and sinus pressure that has resulted in difficulty breathing through nose and poor sleep quality. She has an itchy throat but no dysphagia. Denies cough, runny/itchy eyes or fever. Endorses swollen lymph notes mildly. She has tried Benadryl but it made her sleepy which she doesn't like since she cares for her 4 children and needs to go to work. She has tried other otc in the past that helped. Denies sick contacts. Son also has allergies. She is worried that she is developing asthma but has never had asthma or used an inhaler.  Unrelated: I touched base on the patient's progress for her breast reduction and she states that she has a pre-op mammogram scheduled- 22nd and surgery tentatively on June 3rd.  ROS: per HPI  Pertinent PMHx:  H/o seasonal allergies to plants/pollen  Exam:  Respiratory: patient did sound mildly congested on the phone. No sneezing or coughing during encounter. Able to speak in full sentences.  Assessment/Plan:  Environmental and seasonal allergies Afebrile, no cough, no sick contacts Associated with known allergen contact Provided with flonase and claritin prescription    Time spent during visit with patient: 11 minutes

## 2019-02-25 ENCOUNTER — Encounter: Payer: Self-pay | Admitting: Plastic Surgery

## 2019-02-25 ENCOUNTER — Ambulatory Visit (INDEPENDENT_AMBULATORY_CARE_PROVIDER_SITE_OTHER): Payer: No Typology Code available for payment source | Admitting: Plastic Surgery

## 2019-02-25 ENCOUNTER — Other Ambulatory Visit: Payer: Self-pay

## 2019-02-25 VITALS — BP 144/93 | HR 71 | Temp 98.7°F | Ht 64.0 in | Wt 213.4 lb

## 2019-02-25 DIAGNOSIS — G8929 Other chronic pain: Secondary | ICD-10-CM

## 2019-02-25 DIAGNOSIS — N62 Hypertrophy of breast: Secondary | ICD-10-CM

## 2019-02-25 DIAGNOSIS — M546 Pain in thoracic spine: Secondary | ICD-10-CM | POA: Diagnosis not present

## 2019-02-25 DIAGNOSIS — M542 Cervicalgia: Secondary | ICD-10-CM

## 2019-02-25 MED ORDER — HYDROCODONE-ACETAMINOPHEN 5-325 MG PO TABS: 1.0000 | ORAL_TABLET | Freq: Four times a day (QID) | ORAL | 0 refills | Status: DC | PRN

## 2019-02-25 MED ORDER — CEPHALEXIN 500 MG PO CAPS: 500.0000 mg | ORAL_CAPSULE | Freq: Four times a day (QID) | ORAL | 0 refills | Status: AC

## 2019-02-25 MED ORDER — ONDANSETRON HCL 4 MG PO TABS: 4.0000 mg | ORAL_TABLET | Freq: Three times a day (TID) | ORAL | 0 refills | Status: AC | PRN

## 2019-02-25 NOTE — H&P (View-Only) (Signed)
   Patient ID: Christine Cervantes, female    DOB: 05/13/1986, 33 y.o.   MRN: [id number]   Chief Complaint  Patient presents with  . Follow-up    discuss surgery    The patient is a 33 yrs old bf here for further discussion about breast reduction surgery.  She is 5 feet 4 inches tall and weights 213 pounds.  Her preop bra size is 36 H and she is thinking she would like to be a D/DD. We have estimated a 800 gm reduction on both sides.  She has questions about a lift being part of the surgery and keeping her NAC.  All these questions were answered.   Review of Systems  Constitutional: Negative.  Negative for activity change and appetite change.  HENT: Negative.   Eyes: Negative.   Respiratory: Negative.  Negative for chest tightness and shortness of breath.   Cardiovascular: Negative.  Negative for leg swelling.  Gastrointestinal: Negative.  Negative for abdominal pain.  Endocrine: Negative.   Genitourinary: Negative.   Musculoskeletal: Negative.   Skin: Negative for color change and wound.  Hematological: Negative.   Psychiatric/Behavioral: Negative.     Past Medical History:  Diagnosis Date  . Abnormal Pap smear    cryo on cervix  . Anemia   . Chlamydia   . Depression   . Gonorrhea   . Headache(784.0)   . Pregnancy induced hypertension    Chronic hypertension  . Preterm labor   . Trichimoniasis   . Urinary tract infection   . Vaginal Pap smear, abnormal     Past Surgical History:  Procedure Laterality Date  . FOOT SURGERY  2004-2004   corns removed from both feet, hammertoes repaired  . GYNECOLOGIC CRYOSURGERY        Current Outpatient Medications:  .  citalopram (CELEXA) 10 MG tablet, Take 1 tablet (10 mg total) by mouth daily., Disp: 90 tablet, Rfl: 0 .  fluticasone (FLONASE) 50 MCG/ACT nasal spray, Place 2 sprays into both nostrils daily., Disp: 16 g, Rfl: 6 .  ibuprofen (ADVIL,MOTRIN) 600 MG tablet, Take 1 tablet (600 mg total) by mouth every 6 (six) hours as  needed for cramping., Disp: 30 tablet, Rfl: 2 .  loratadine-pseudoephedrine (CLARITIN-D 12-HOUR) 5-120 MG tablet, Take 1 tablet by mouth 2 (two) times daily., Disp: 12 tablet, Rfl: 0 .  Multiple Vitamin (MULTIVITAMIN WITH MINERALS) TABS tablet, Take 1 tablet by mouth daily., Disp: , Rfl:  .  Norgestimate-Ethinyl Estradiol Triphasic 0.18/0.215/0.25 MG-25 MCG tab, Take 1 tablet by mouth daily. (Patient not taking: Reported on 12/10/2018), Disp: 1 Package, Rfl: 11   Objective:   Vitals:   02/25/19 1541  BP: (!) 144/93  Pulse: 71  Temp: 98.7 F (37.1 C)  SpO2: 97%    Physical Exam Vitals signs and nursing note reviewed.  Constitutional:      Appearance: Normal appearance.  HENT:     Head: Normocephalic and atraumatic.     Nose: Nose normal.     Mouth/Throat:     Mouth: Mucous membranes are moist.  Neck:     Musculoskeletal: Normal range of motion.  Cardiovascular:     Rate and Rhythm: Normal rate.  Pulmonary:     Effort: Pulmonary effort is normal. No respiratory distress.  Neurological:     General: No focal deficit present.     Mental Status: She is alert and oriented to person, place, and time.  Psychiatric:        Mood and   Affect: Mood normal.        Behavior: Behavior normal.     Assessment & Plan:  Neck pain  Macromastia  Chronic bilateral thoracic back pain  Plan for bilateral breast reduction.  We will plan for one night observation. We had a full discussion about the surgery, lift and keeping the NAC.    The risk that can be encountered with breast reduction were discussed and include the following but not limited to these:  Breast asymmetry, fluid accumulation, firmness of the breast, inability to breast feed, loss of nipple or areola, skin loss, decrease or no nipple sensation, fat necrosis of the breast tissue, bleeding, infection, healing delay.  There are risks of anesthesia, changes to skin sensation and injury to nerves or blood vessels.  The muscle can be  temporarily or permanently injured.  You may have an allergic reaction to tape, suture, glue, blood products which can result in skin discoloration, swelling, pain, skin lesions, poor healing.  Any of these can lead to the need for revisonal surgery or stage procedures.  A reduction has potential to interfere with diagnostic procedures.  Nipple or breast piercing can increase risks of infection.  This procedure is best done when the breast is fully developed.  Changes in the breast will continue to occur over time.  Pregnancy can alter the outcomes of previous breast reduction surgery, weight gain and weigh loss can also effect the long term appearance.     Alena Bills Rhiannan Kievit, DO

## 2019-02-25 NOTE — Progress Notes (Signed)
Patient ID: Christine Cervantes, female    DOB: 10/27/1985, 33 y.o.   MRN: [id number]   Chief Complaint  Patient presents with  . Follow-up    discuss surgery    The patient is a 33 yrs old bf here for further discussion about breast reduction surgery.  She is 5 feet 4 inches tall and weights 213 pounds.  Her preop bra size is 36 H and she is thinking she would like to be a D/DD. We have estimated a 800 gm reduction on both sides.  She has questions about a lift being part of the surgery and keeping her NAC.  All these questions were answered.   Review of Systems  Constitutional: Negative.  Negative for activity change and appetite change.  HENT: Negative.   Eyes: Negative.   Respiratory: Negative.  Negative for chest tightness and shortness of breath.   Cardiovascular: Negative.  Negative for leg swelling.  Gastrointestinal: Negative.  Negative for abdominal pain.  Endocrine: Negative.   Genitourinary: Negative.   Musculoskeletal: Negative.   Skin: Negative for color change and wound.  Hematological: Negative.   Psychiatric/Behavioral: Negative.     Past Medical History:  Diagnosis Date  . Abnormal Pap smear    cryo on cervix  . Anemia   . Chlamydia   . Depression   . Gonorrhea   . Headache(784.0)   . Pregnancy induced hypertension    Chronic hypertension  . Preterm labor   . Trichimoniasis   . Urinary tract infection   . Vaginal Pap smear, abnormal     Past Surgical History:  Procedure Laterality Date  . FOOT SURGERY  2004-2004   corns removed from both feet, hammertoes repaired  . GYNECOLOGIC CRYOSURGERY        Current Outpatient Medications:  .  citalopram (CELEXA) 10 MG tablet, Take 1 tablet (10 mg total) by mouth daily., Disp: 90 tablet, Rfl: 0 .  fluticasone (FLONASE) 50 MCG/ACT nasal spray, Place 2 sprays into both nostrils daily., Disp: 16 g, Rfl: 6 .  ibuprofen (ADVIL,MOTRIN) 600 MG tablet, Take 1 tablet (600 mg total) by mouth every 6 (six) hours as  needed for cramping., Disp: 30 tablet, Rfl: 2 .  loratadine-pseudoephedrine (CLARITIN-D 12-HOUR) 5-120 MG tablet, Take 1 tablet by mouth 2 (two) times daily., Disp: 12 tablet, Rfl: 0 .  Multiple Vitamin (MULTIVITAMIN WITH MINERALS) TABS tablet, Take 1 tablet by mouth daily., Disp: , Rfl:  .  Norgestimate-Ethinyl Estradiol Triphasic 0.18/0.215/0.25 MG-25 MCG tab, Take 1 tablet by mouth daily. (Patient not taking: Reported on 12/10/2018), Disp: 1 Package, Rfl: 11   Objective:   Vitals:   02/25/19 1541  BP: (!) 144/93  Pulse: 71  Temp: 98.7 F (37.1 C)  SpO2: 97%    Physical Exam Vitals signs and nursing note reviewed.  Constitutional:      Appearance: Normal appearance.  HENT:     Head: Normocephalic and atraumatic.     Nose: Nose normal.     Mouth/Throat:     Mouth: Mucous membranes are moist.  Neck:     Musculoskeletal: Normal range of motion.  Cardiovascular:     Rate and Rhythm: Normal rate.  Pulmonary:     Effort: Pulmonary effort is normal. No respiratory distress.  Neurological:     General: No focal deficit present.     Mental Status: She is alert and oriented to person, place, and time.  Psychiatric:        Mood and  Affect: Mood normal.        Behavior: Behavior normal.     Assessment & Plan:  Neck pain  Macromastia  Chronic bilateral thoracic back pain  Plan for bilateral breast reduction.  We will plan for one night observation. We had a full discussion about the surgery, lift and keeping the NAC.    The risk that can be encountered with breast reduction were discussed and include the following but not limited to these:  Breast asymmetry, fluid accumulation, firmness of the breast, inability to breast feed, loss of nipple or areola, skin loss, decrease or no nipple sensation, fat necrosis of the breast tissue, bleeding, infection, healing delay.  There are risks of anesthesia, changes to skin sensation and injury to nerves or blood vessels.  The muscle can be  temporarily or permanently injured.  You may have an allergic reaction to tape, suture, glue, blood products which can result in skin discoloration, swelling, pain, skin lesions, poor healing.  Any of these can lead to the need for revisonal surgery or stage procedures.  A reduction has potential to interfere with diagnostic procedures.  Nipple or breast piercing can increase risks of infection.  This procedure is best done when the breast is fully developed.  Changes in the breast will continue to occur over time.  Pregnancy can alter the outcomes of previous breast reduction surgery, weight gain and weigh loss can also effect the long term appearance.     Alena Bills Taj Nevins, DO

## 2019-02-27 ENCOUNTER — Telehealth: Payer: No Typology Code available for payment source | Admitting: Physician Assistant

## 2019-02-27 DIAGNOSIS — B9689 Other specified bacterial agents as the cause of diseases classified elsewhere: Secondary | ICD-10-CM

## 2019-02-27 DIAGNOSIS — N760 Acute vaginitis: Secondary | ICD-10-CM

## 2019-02-27 MED ORDER — FLUCONAZOLE 150 MG PO TABS: 150.0000 mg | ORAL_TABLET | Freq: Once | ORAL | 0 refills | Status: AC

## 2019-02-27 MED ORDER — METRONIDAZOLE 500 MG PO TABS: 500.0000 mg | ORAL_TABLET | Freq: Two times a day (BID) | ORAL | 0 refills | Status: DC

## 2019-02-27 NOTE — Addendum Note (Signed)
Addended by: Waldon Merl on: 02/27/2019 08:09 PM   Modules accepted: Orders

## 2019-02-27 NOTE — Progress Notes (Signed)
Decision changed with new information given by patient that was not included in questionnaire or initial discussions. Suspect more BV than yeast. Diflucan was dc'd and a script for Flagyl sent to pharmacy.

## 2019-02-27 NOTE — Progress Notes (Signed)

## 2019-02-27 NOTE — Progress Notes (Signed)
I have spent 5 minutes in review of e-visit questionnaire, review and updating patient chart, medical decision making and response to patient.   Airyn Ellzey Cody Araya Roel, PA-C    

## 2019-02-28 ENCOUNTER — Ambulatory Visit
Admission: RE | Admit: 2019-02-28 | Discharge: 2019-02-28 | Disposition: A | Discharge status: Home / Self Care | Payer: No Typology Code available for payment source | Source: Ambulatory Visit | Attending: Family Medicine | Admitting: Family Medicine

## 2019-02-28 ENCOUNTER — Other Ambulatory Visit: Payer: Self-pay

## 2019-02-28 DIAGNOSIS — N62 Hypertrophy of breast: Secondary | ICD-10-CM

## 2019-03-07 ENCOUNTER — Encounter: Payer: No Typology Code available for payment source | Admitting: Plastic Surgery

## 2019-03-07 ENCOUNTER — Other Ambulatory Visit: Payer: Self-pay

## 2019-03-07 ENCOUNTER — Encounter (HOSPITAL_BASED_OUTPATIENT_CLINIC_OR_DEPARTMENT_OTHER): Payer: Self-pay | Admitting: *Deleted

## 2019-03-10 ENCOUNTER — Other Ambulatory Visit (HOSPITAL_COMMUNITY): Admission: RE | Admit: 2019-03-10 | Payer: No Typology Code available for payment source | Source: Ambulatory Visit

## 2019-03-12 ENCOUNTER — Ambulatory Visit (HOSPITAL_BASED_OUTPATIENT_CLINIC_OR_DEPARTMENT_OTHER)
Admission: RE | Admit: 2019-03-12 | Discharge: 2019-03-12 | Disposition: A | Discharge status: Home / Self Care | Payer: No Typology Code available for payment source | Attending: Plastic Surgery | Admitting: Plastic Surgery

## 2019-03-12 ENCOUNTER — Other Ambulatory Visit (HOSPITAL_COMMUNITY)
Admission: RE | Admit: 2019-03-12 | Discharge: 2019-03-12 | Disposition: A | Discharge status: Home / Self Care | Payer: No Typology Code available for payment source | Source: Ambulatory Visit | Attending: Plastic Surgery | Admitting: Plastic Surgery

## 2019-03-12 ENCOUNTER — Other Ambulatory Visit: Payer: Self-pay

## 2019-03-12 ENCOUNTER — Ambulatory Visit (HOSPITAL_BASED_OUTPATIENT_CLINIC_OR_DEPARTMENT_OTHER): Payer: No Typology Code available for payment source | Admitting: Anesthesiology

## 2019-03-12 ENCOUNTER — Encounter (HOSPITAL_BASED_OUTPATIENT_CLINIC_OR_DEPARTMENT_OTHER)
Admission: RE | Disposition: A | Discharge status: Home / Self Care | Payer: Self-pay | Source: Home / Self Care | Attending: Plastic Surgery

## 2019-03-12 ENCOUNTER — Encounter (HOSPITAL_BASED_OUTPATIENT_CLINIC_OR_DEPARTMENT_OTHER): Payer: Self-pay | Admitting: Anesthesiology

## 2019-03-12 DIAGNOSIS — G8929 Other chronic pain: Secondary | ICD-10-CM | POA: Diagnosis not present

## 2019-03-12 DIAGNOSIS — F329 Major depressive disorder, single episode, unspecified: Secondary | ICD-10-CM | POA: Insufficient documentation

## 2019-03-12 DIAGNOSIS — M549 Dorsalgia, unspecified: Secondary | ICD-10-CM

## 2019-03-12 DIAGNOSIS — I10 Essential (primary) hypertension: Secondary | ICD-10-CM | POA: Diagnosis not present

## 2019-03-12 DIAGNOSIS — Z1159 Encounter for screening for other viral diseases: Secondary | ICD-10-CM | POA: Insufficient documentation

## 2019-03-12 DIAGNOSIS — R51 Headache: Secondary | ICD-10-CM | POA: Insufficient documentation

## 2019-03-12 DIAGNOSIS — Z79899 Other long term (current) drug therapy: Secondary | ICD-10-CM | POA: Diagnosis not present

## 2019-03-12 DIAGNOSIS — N62 Hypertrophy of breast: Secondary | ICD-10-CM | POA: Insufficient documentation

## 2019-03-12 DIAGNOSIS — Z791 Long term (current) use of non-steroidal anti-inflammatories (NSAID): Secondary | ICD-10-CM | POA: Diagnosis not present

## 2019-03-12 DIAGNOSIS — M546 Pain in thoracic spine: Secondary | ICD-10-CM | POA: Insufficient documentation

## 2019-03-12 DIAGNOSIS — M542 Cervicalgia: Secondary | ICD-10-CM

## 2019-03-12 DIAGNOSIS — Z7951 Long term (current) use of inhaled steroids: Secondary | ICD-10-CM | POA: Insufficient documentation

## 2019-03-12 HISTORY — PX: REDUCTION MAMMAPLASTY: SUR839

## 2019-03-12 HISTORY — PX: BREAST REDUCTION SURGERY: SHX8

## 2019-03-12 LAB — SARS CORONAVIRUS 2 BY RT PCR (HOSPITAL ORDER, PERFORMED IN ~~LOC~~ HOSPITAL LAB): SARS Coronavirus 2: NEGATIVE

## 2019-03-12 SURGERY — MAMMOPLASTY, REDUCTION
Anesthesia: General | Site: Breast | Laterality: Bilateral

## 2019-03-12 MED ORDER — BUPIVACAINE-EPINEPHRINE 0.25% -1:200000 IJ SOLN
INTRAMUSCULAR | Status: DC | PRN
  Administered 2019-03-12: 30 mL

## 2019-03-12 MED ORDER — ONDANSETRON HCL 4 MG/2ML IJ SOLN
INTRAMUSCULAR | Status: DC | PRN
  Administered 2019-03-12: 4 mg via INTRAVENOUS

## 2019-03-12 MED ORDER — SUCCINYLCHOLINE CHLORIDE 200 MG/10ML IV SOSY
PREFILLED_SYRINGE | INTRAVENOUS | Status: AC
  Filled 2019-03-12: qty 10

## 2019-03-12 MED ORDER — SUFENTANIL CITRATE 50 MCG/ML IV SOLN
INTRAVENOUS | Status: AC
  Filled 2019-03-12: qty 1

## 2019-03-12 MED ORDER — ACETAMINOPHEN 10 MG/ML IV SOLN
1000.0000 mg | Freq: Once | INTRAVENOUS | Status: DC | PRN
  Administered 2019-03-12: 1000 mg via INTRAVENOUS

## 2019-03-12 MED ORDER — CEFAZOLIN SODIUM-DEXTROSE 2-4 GM/100ML-% IV SOLN
2.0000 g | INTRAVENOUS | Status: AC
  Administered 2019-03-12: 2 g via INTRAVENOUS

## 2019-03-12 MED ORDER — PROPOFOL 10 MG/ML IV BOLUS
INTRAVENOUS | Status: DC | PRN
  Administered 2019-03-12: 150 mg via INTRAVENOUS

## 2019-03-12 MED ORDER — MIDAZOLAM HCL 2 MG/2ML IJ SOLN
1.0000 mg | INTRAMUSCULAR | Status: DC | PRN
  Administered 2019-03-12: 2 mg via INTRAVENOUS

## 2019-03-12 MED ORDER — ACETAMINOPHEN 650 MG RE SUPP: 650.0000 mg | RECTAL | Status: DC | PRN

## 2019-03-12 MED ORDER — SODIUM CHLORIDE 0.9 % IR SOLN
Status: DC | PRN
  Administered 2019-03-12: 100 mL

## 2019-03-12 MED ORDER — OXYCODONE HCL 5 MG PO TABS
ORAL_TABLET | ORAL | Status: AC
  Filled 2019-03-12: qty 1

## 2019-03-12 MED ORDER — LIDOCAINE HCL (CARDIAC) PF 100 MG/5ML IV SOSY
PREFILLED_SYRINGE | INTRAVENOUS | Status: DC | PRN
  Administered 2019-03-12: 50 mg via INTRAVENOUS

## 2019-03-12 MED ORDER — SODIUM CHLORIDE 0.9% FLUSH: 3.0000 mL | Freq: Two times a day (BID) | INTRAVENOUS | Status: DC

## 2019-03-12 MED ORDER — EPHEDRINE SULFATE 50 MG/ML IJ SOLN
INTRAMUSCULAR | Status: DC | PRN
  Administered 2019-03-12: 10 mg via INTRAVENOUS

## 2019-03-12 MED ORDER — LIDOCAINE 2% (20 MG/ML) 5 ML SYRINGE
INTRAMUSCULAR | Status: AC
  Filled 2019-03-12: qty 5

## 2019-03-12 MED ORDER — SUGAMMADEX SODIUM 200 MG/2ML IV SOLN
INTRAVENOUS | Status: DC | PRN
  Administered 2019-03-12: 200 mg via INTRAVENOUS

## 2019-03-12 MED ORDER — SCOPOLAMINE 1 MG/3DAYS TD PT72: 1.0000 | MEDICATED_PATCH | Freq: Once | TRANSDERMAL | Status: DC | PRN

## 2019-03-12 MED ORDER — ONDANSETRON HCL 4 MG/2ML IJ SOLN
INTRAMUSCULAR | Status: AC
  Filled 2019-03-12: qty 2

## 2019-03-12 MED ORDER — SODIUM CHLORIDE 0.9% FLUSH: 3.0000 mL | INTRAVENOUS | Status: DC | PRN

## 2019-03-12 MED ORDER — OXYCODONE HCL 5 MG/5ML PO SOLN: 5.0000 mg | Freq: Once | ORAL | Status: AC | PRN

## 2019-03-12 MED ORDER — SUGAMMADEX SODIUM 500 MG/5ML IV SOLN
INTRAVENOUS | Status: AC
  Filled 2019-03-12: qty 5

## 2019-03-12 MED ORDER — MIDAZOLAM HCL 2 MG/2ML IJ SOLN
INTRAMUSCULAR | Status: AC
  Filled 2019-03-12: qty 2

## 2019-03-12 MED ORDER — LACTATED RINGERS IV SOLN: INTRAVENOUS | Status: DC

## 2019-03-12 MED ORDER — FENTANYL CITRATE (PF) 100 MCG/2ML IJ SOLN
INTRAMUSCULAR | Status: AC
  Filled 2019-03-12: qty 2

## 2019-03-12 MED ORDER — ACETAMINOPHEN 325 MG PO TABS: 325.0000 mg | ORAL_TABLET | Freq: Once | ORAL | Status: DC | PRN

## 2019-03-12 MED ORDER — ROCURONIUM BROMIDE 100 MG/10ML IV SOLN
INTRAVENOUS | Status: DC | PRN
  Administered 2019-03-12: 50 mg via INTRAVENOUS

## 2019-03-12 MED ORDER — ROCURONIUM BROMIDE 10 MG/ML (PF) SYRINGE
PREFILLED_SYRINGE | INTRAVENOUS | Status: AC
  Filled 2019-03-12: qty 10

## 2019-03-12 MED ORDER — OXYCODONE HCL 5 MG PO TABS
5.0000 mg | ORAL_TABLET | Freq: Once | ORAL | Status: AC | PRN
  Administered 2019-03-12: 5 mg via ORAL

## 2019-03-12 MED ORDER — ACETAMINOPHEN 325 MG PO TABS: 650.0000 mg | ORAL_TABLET | ORAL | Status: DC | PRN

## 2019-03-12 MED ORDER — PHENYLEPHRINE HCL (PRESSORS) 10 MG/ML IV SOLN
INTRAVENOUS | Status: DC | PRN
  Administered 2019-03-12: 80 ug via INTRAVENOUS

## 2019-03-12 MED ORDER — EPHEDRINE 5 MG/ML INJ
INTRAVENOUS | Status: AC
  Filled 2019-03-12: qty 10

## 2019-03-12 MED ORDER — ACETAMINOPHEN 160 MG/5ML PO SOLN: 325.0000 mg | Freq: Once | ORAL | Status: DC | PRN

## 2019-03-12 MED ORDER — DEXAMETHASONE SODIUM PHOSPHATE 10 MG/ML IJ SOLN
INTRAMUSCULAR | Status: AC
  Filled 2019-03-12: qty 1

## 2019-03-12 MED ORDER — FENTANYL CITRATE (PF) 100 MCG/2ML IJ SOLN
25.0000 ug | INTRAMUSCULAR | Status: DC | PRN
  Administered 2019-03-12: 25 ug via INTRAVENOUS

## 2019-03-12 MED ORDER — PHENYLEPHRINE 40 MCG/ML (10ML) SYRINGE FOR IV PUSH (FOR BLOOD PRESSURE SUPPORT)
PREFILLED_SYRINGE | INTRAVENOUS | Status: AC
  Filled 2019-03-12: qty 10

## 2019-03-12 MED ORDER — PROMETHAZINE HCL 25 MG/ML IJ SOLN
INTRAMUSCULAR | Status: AC
  Filled 2019-03-12: qty 1

## 2019-03-12 MED ORDER — BUPIVACAINE-EPINEPHRINE (PF) 0.25% -1:200000 IJ SOLN
INTRAMUSCULAR | Status: AC
  Filled 2019-03-12: qty 30

## 2019-03-12 MED ORDER — FENTANYL CITRATE (PF) 100 MCG/2ML IJ SOLN: 50.0000 ug | INTRAMUSCULAR | Status: DC | PRN

## 2019-03-12 MED ORDER — DEXAMETHASONE SODIUM PHOSPHATE 4 MG/ML IJ SOLN
INTRAMUSCULAR | Status: DC | PRN
  Administered 2019-03-12: 10 mg via INTRAVENOUS

## 2019-03-12 MED ORDER — CHLORHEXIDINE GLUCONATE 4 % EX LIQD: 1.0000 "application " | Freq: Once | CUTANEOUS | Status: DC

## 2019-03-12 MED ORDER — PROMETHAZINE HCL 25 MG/ML IJ SOLN
6.2500 mg | INTRAMUSCULAR | Status: DC | PRN
  Administered 2019-03-12: 6.25 mg via INTRAVENOUS

## 2019-03-12 MED ORDER — OXYCODONE HCL 5 MG PO TABS: 5.0000 mg | ORAL_TABLET | ORAL | Status: DC | PRN

## 2019-03-12 MED ORDER — MEPERIDINE HCL 25 MG/ML IJ SOLN: 6.2500 mg | INTRAMUSCULAR | Status: DC | PRN

## 2019-03-12 MED ORDER — SUFENTANIL CITRATE 50 MCG/ML IV SOLN
INTRAVENOUS | Status: DC | PRN
  Administered 2019-03-12 (×3): 10 ug via INTRAVENOUS

## 2019-03-12 MED ORDER — CEFAZOLIN SODIUM-DEXTROSE 2-4 GM/100ML-% IV SOLN
INTRAVENOUS | Status: AC
  Filled 2019-03-12: qty 100

## 2019-03-12 MED ORDER — LACTATED RINGERS IV SOLN
INTRAVENOUS | Status: DC
  Administered 2019-03-12 (×2): via INTRAVENOUS

## 2019-03-12 MED ORDER — ACETAMINOPHEN 10 MG/ML IV SOLN
INTRAVENOUS | Status: AC
  Filled 2019-03-12: qty 100

## 2019-03-12 MED ORDER — SODIUM CHLORIDE 0.9 % IV SOLN: 250.0000 mL | INTRAVENOUS | Status: DC | PRN

## 2019-03-12 SURGICAL SUPPLY — 71 items
ADH SKN CLS APL DERMABOND .7 (GAUZE/BANDAGES/DRESSINGS) ×3
APL PRP STRL LF DISP 70% ISPRP (MISCELLANEOUS) ×2
BAG DECANTER FOR FLEXI CONT (MISCELLANEOUS) ×3 IMPLANT
BINDER BREAST LRG (GAUZE/BANDAGES/DRESSINGS) IMPLANT
BINDER BREAST MEDIUM (GAUZE/BANDAGES/DRESSINGS) IMPLANT
BINDER BREAST XLRG (GAUZE/BANDAGES/DRESSINGS) IMPLANT
BINDER BREAST XXLRG (GAUZE/BANDAGES/DRESSINGS) ×2 IMPLANT
BIOPATCH RED 1 DISK 7.0 (GAUZE/BANDAGES/DRESSINGS) IMPLANT
BIOPATCH RED 1IN DISK 7.0MM (GAUZE/BANDAGES/DRESSINGS)
BLADE HEX COATED 2.75 (ELECTRODE) ×3 IMPLANT
BLADE KNIFE PERSONA 10 (BLADE) ×6 IMPLANT
BLADE SURG 15 STRL LF DISP TIS (BLADE) IMPLANT
BLADE SURG 15 STRL SS (BLADE)
BNDG GAUZE ELAST 4 BULKY (GAUZE/BANDAGES/DRESSINGS) ×2 IMPLANT
CANISTER SUCT 1200ML W/VALVE (MISCELLANEOUS) ×3 IMPLANT
CHLORAPREP W/TINT 26 (MISCELLANEOUS) ×5 IMPLANT
COVER BACK TABLE REUSABLE LG (DRAPES) ×3 IMPLANT
COVER MAYO STAND REUSABLE (DRAPES) ×3 IMPLANT
COVER WAND RF STERILE (DRAPES) IMPLANT
DECANTER SPIKE VIAL GLASS SM (MISCELLANEOUS) ×2 IMPLANT
DERMABOND ADVANCED (GAUZE/BANDAGES/DRESSINGS) ×6
DERMABOND ADVANCED .7 DNX12 (GAUZE/BANDAGES/DRESSINGS) IMPLANT
DRAIN CHANNEL 19F RND (DRAIN) IMPLANT
DRAPE LAPAROSCOPIC ABDOMINAL (DRAPES) ×3 IMPLANT
DRSG PAD ABDOMINAL 8X10 ST (GAUZE/BANDAGES/DRESSINGS) ×6 IMPLANT
ELECT BLADE 4.0 EZ CLEAN MEGAD (MISCELLANEOUS) ×3
ELECT REM PT RETURN 9FT ADLT (ELECTROSURGICAL) ×3
ELECTRODE BLDE 4.0 EZ CLN MEGD (MISCELLANEOUS) IMPLANT
ELECTRODE REM PT RTRN 9FT ADLT (ELECTROSURGICAL) ×1 IMPLANT
EVACUATOR SILICONE 100CC (DRAIN) IMPLANT
GAUZE SPONGE 4X4 12PLY STRL LF (GAUZE/BANDAGES/DRESSINGS) ×3 IMPLANT
GLOVE BIO SURGEON STRL SZ 6.5 (GLOVE) ×3 IMPLANT
GLOVE BIO SURGEONS STRL SZ 6.5 (GLOVE)
GLOVE SURG SS PI 6.5 STRL IVOR (GLOVE) ×8 IMPLANT
GLOVE SURG SS PI 7.0 STRL IVOR (GLOVE) ×2 IMPLANT
GOWN STRL REUS W/ TWL LRG LVL3 (GOWN DISPOSABLE) ×3 IMPLANT
GOWN STRL REUS W/ TWL XL LVL3 (GOWN DISPOSABLE) IMPLANT
GOWN STRL REUS W/TWL LRG LVL3 (GOWN DISPOSABLE) ×6
GOWN STRL REUS W/TWL XL LVL3 (GOWN DISPOSABLE) ×9
NDL HYPO 25X1 1.5 SAFETY (NEEDLE) ×1 IMPLANT
NDL SAFETY ECLIPSE 18X1.5 (NEEDLE) IMPLANT
NEEDLE HYPO 18GX1.5 SHARP (NEEDLE)
NEEDLE HYPO 25X1 1.5 SAFETY (NEEDLE) ×3 IMPLANT
NS IRRIG 1000ML POUR BTL (IV SOLUTION) ×2 IMPLANT
PACK BASIN DAY SURGERY FS (CUSTOM PROCEDURE TRAY) ×3 IMPLANT
PAD ALCOHOL SWAB (MISCELLANEOUS) IMPLANT
PENCIL BUTTON HOLSTER BLD 10FT (ELECTRODE) ×3 IMPLANT
PIN SAFETY STERILE (MISCELLANEOUS) IMPLANT
SLEEVE SCD COMPRESS KNEE MED (MISCELLANEOUS) ×3 IMPLANT
SPONGE LAP 18X18 RF (DISPOSABLE) ×10 IMPLANT
STRIP SUTURE WOUND CLOSURE 1/2 (SUTURE) ×10 IMPLANT
SUT MNCRL AB 4-0 PS2 18 (SUTURE) ×16 IMPLANT
SUT MON AB 3-0 SH 27 (SUTURE) ×18
SUT MON AB 3-0 SH27 (SUTURE) ×1 IMPLANT
SUT MON AB 5-0 PS2 18 (SUTURE) ×12 IMPLANT
SUT PDS 3-0 CT2 (SUTURE)
SUT PDS AB 2-0 CT2 27 (SUTURE) IMPLANT
SUT PDS II 3-0 CT2 27 ABS (SUTURE) IMPLANT
SUT SILK 3 0 PS 1 (SUTURE) IMPLANT
SYR 3ML 23GX1 SAFETY (SYRINGE) ×1 IMPLANT
SYR 50ML LL SCALE MARK (SYRINGE) IMPLANT
SYR BULB IRRIGATION 50ML (SYRINGE) ×3 IMPLANT
SYR CONTROL 10ML LL (SYRINGE) ×3 IMPLANT
TAPE MEASURE VINYL STERILE (MISCELLANEOUS) ×3 IMPLANT
TOWEL GREEN STERILE FF (TOWEL DISPOSABLE) ×6 IMPLANT
TUBE CONNECTING 20'X1/4 (TUBING) ×1
TUBE CONNECTING 20X1/4 (TUBING) ×2 IMPLANT
TUBING INFILTRATION IT-10001 (TUBING) IMPLANT
TUBING SET GRADUATE ASPIR 12FT (MISCELLANEOUS) IMPLANT
UNDERPAD 30X30 (UNDERPADS AND DIAPERS) ×6 IMPLANT
YANKAUER SUCT BULB TIP NO VENT (SUCTIONS) ×3 IMPLANT

## 2019-03-12 NOTE — Discharge Instructions (Signed)
1,000mg  Tylenol given at 2:45pm - No Tylenol until 8:45pm if needed  INSTRUCTIONS FOR AFTER BREAST SURGERY   You are getting ready to undergo breast surgery.  You will likely have some questions about what to expect following your operation.  The following information will help you and your family understand what to expect when you are discharged from the hospital.  Following these guidelines will help ensure a smooth recovery and reduce risks of complications.   Postoperative instructions include information on: diet, wound care, medications and physical activity.  AFTER SURGERY Expect to go home after the procedure.  In some cases, you may need to spend one night in the hospital for observation.  DIET Breast surgery does not require a specific diet.  However, I have to mention that the healthier you eat the better your body can start healing. It is important to increasing your protein intake.  This means limiting the foods with sugar and carbohydrates.  Focus on vegetables and some meat.  If you have any liposuction during your procedure be sure to drink water.  If your urine is bright yellow, then it is concentrated, and you need to drink more water.  As a general rule after surgery, you should have 8 ounces of water every hour while awake.  If you find you are persistently nauseated or unable to take in liquids let us know.  NO TOBACCO USE or EXPOSURE.  This will slow your healing process and increase the risk of a wound.  WOUND CARE You can shower the day after surgery.  Use fragrance free soap.  Dial, Dove and [place] are usually mild on the skin.  If you have steri-strips / tape directly attached to your skin leave them in place. It is OK to get these wet.  No baths, pools or hot tubs for two weeks. We close your incision to leave the smallest and best-looking scar. No ointment or creams on your incisions until given the go ahead.  Especially not Neosporin (Too many skin reactions with this  one).  A few weeks after surgery you can use Mederma and start massaging the scar. We ask you to wear your binder or sports bra for the first 6 weeks around the clock, including while sleeping. This provides added comfort and helps reduce the fluid accumulation at the surgery site.  ACTIVITY No heavy lifting until cleared by the doctor.  This usually means no more than a half-gallon of milk.  It is OK to walk and climb stairs. In fact, moving your legs is very important to decrease your risk of a blood clot.  It will also help keep you from getting deconditioned.  Every 1 to 2 hours get up and walk for 5 minutes. This will help with a quicker recovery back to normal.  Let pain be your guide so you don't do too much.  NO, you cannot do the spring cleaning and don't plan on taking care of anyone else.  This is your time for TLC.  You will be more comfortable if you sleep and rest with your head elevated either with a few pillows under you or in a recliner.  No stomach sleeping for a few months.  WORK Everyone returns to work at different times. As a rough guide, most people take at least 1 - 2 weeks off prior to returning to work. If you need documentation for your job, bring the forms to your postoperative follow up visit.  DRIVING Arrange for someone  to bring you home from the hospital.  You may be able to drive a few days after surgery but not while taking any narcotics or valium.  BOWEL MOVEMENTS Constipation can occur after anesthesia and while taking pain medication.  It is important to stay ahead for your comfort.  We recommend taking Milk of Magnesia (2 tablespoons; twice a day) while taking the pain pills.  SEROMA This is fluid your body tried to put in the surgical site.  This is normal but if it creates tight skinny skin let us know.  It usually decreases in a few weeks.  WHEN TO CALL Call your surgeon's office if any of the following occur:  Fever 101 degrees F or greater  Excessive  bleeding or fluid from the incision site.  Pain that increases over time without aid from the medications  Redness, warmth, or pus draining from incision sites  Persistent nausea or inability to take in liquids  Severe misshapen area that underwent the operation.  Here are some resources:  1. Plastic surgery website: https://www.plasticsurgery.org/for-medical-professionals/education-and-resources/publications/breast-reconstruction-magazine 2. Breast Reconstruction Awareness Campaign:  ChessContest.frhttp://www.breastreconusa.org/ 3. Plastic surgery Implant information:  https://www.plasticsurgery.org/patient-safety/breast-implant-safety   Post Anesthesia Home Care Instructions  Activity: Get plenty of rest for the remainder of the day. A responsible individual must stay with you for 24 hours following the procedure.  For the next 24 hours, DO NOT: -Drive a car -Advertising copywriterperate machinery -Drink alcoholic beverages -Take any medication unless instructed by your physician -Make any legal decisions or sign important papers.  Meals: Start with liquid foods such as gelatin or soup. Progress to regular foods as tolerated. Avoid greasy, spicy, heavy foods. If nausea and/or vomiting occur, drink only clear liquids until the nausea and/or vomiting subsides. Call your physician if vomiting continues.  Special Instructions/Symptoms: Your throat may feel dry or sore from the anesthesia or the breathing tube placed in your throat during surgery. If this causes discomfort, gargle with warm salt water. The discomfort should disappear within 24 hours.  If you had a scopolamine patch placed behind your ear for the management of post- operative nausea and/or vomiting:  1. The medication in the patch is effective for 72 hours, after which it should be removed.  Wrap patch in a tissue and discard in the trash. Wash hands thoroughly with soap and water. 2. You may remove the patch earlier than 72 hours if you experience  unpleasant side effects which may include dry mouth, dizziness or visual disturbances. 3. Avoid touching the patch. Wash your hands with soap and water after contact with the patch.

## 2019-03-12 NOTE — Interval H&P Note (Signed)
History and Physical Interval Note:  03/12/2019 2:06 PM  Christine Cervantes  has presented today for surgery, with the diagnosis of Mammary Hypertrophy.  The various methods of treatment have been discussed with the patient and family. After consideration of risks, benefits and other options for treatment, the patient has consented to  Procedure(s) with comments: BILATERAL MAMMARY REDUCTION  (BREAST) (Bilateral) - 3.5 hours, please as a surgical intervention.  The patient's history has been reviewed, patient examined, no change in status, stable for surgery.  I have reviewed the patient's chart and labs.  Questions were answered to the patient's satisfaction.     Alena Bills [place]ham

## 2019-03-12 NOTE — Transfer of Care (Signed)
Immediate Anesthesia Transfer of Care Note  Patient: Christine Cervantes  Procedure(s) Performed: BILATERAL MAMMARY REDUCTION  (BREAST) (Bilateral Breast)  Patient Location: PACU  Anesthesia Type:General  Level of Consciousness: awake, alert  and oriented  Airway & Oxygen Therapy: Patient Spontanous Breathing and Patient connected to nasal cannula oxygen  Post-op Assessment: Report given to RN and Post -op Vital signs reviewed and stable  Post vital signs: Reviewed and stable  Last Vitals:  Vitals Value Taken Time  BP    Temp    Pulse 109 03/12/2019  2:15 PM  Resp 23 03/12/2019  2:15 PM  SpO2 93 % 03/12/2019  2:15 PM  Vitals shown include unvalidated device data.  Last Pain:  Vitals:   03/12/19 1049  TempSrc: Oral         Complications: No apparent anesthesia complications

## 2019-03-12 NOTE — Op Note (Signed)
Breast Reduction Op note:    DATE OF PROCEDURE: 03/12/2019  LOCATION: [name]  SURGEON: Alan Ripper Sanger Breeanna Galgano, DO  ASSISTANT: Enedina Finner, RNFA  PREOPERATIVE DIAGNOSIS 1. Macromastia 2. Neck Pain 3. Back Pain  POSTOPERATIVE DIAGNOSIS 1. Macromastia 2. Neck Pain 3. Back Pain  PROCEDURES 1. Bilateral breast reduction.  Right reduction 825g, Left reduction 1034g  COMPLICATIONS: None.  DRAINS: none  INDICATIONS FOR PROCEDURE Christine Cervantes is a 33 y.o. year-old female born on 06-26-1986,with a history of symptomatic macromastia with concominant back pain, neck pain, shoulder grooving from her bra.   MRN: [id number]  CONSENT Informed consent was obtained directly from the patient. The risks, benefits and alternatives were fully discussed. Specific risks including but not limited to bleeding, infection, hematoma, seroma, scarring, pain, nipple necrosis, asymmetry, poor cosmetic results, and need for further surgery were discussed. The patient had ample opportunity to have her questions answered to her satisfaction.  DESCRIPTION OF PROCEDURE  Patient was brought into the operating room and placed in a supine position.  SCDs were placed and appropriate padding was performed.  Antibiotics were given. The patient underwent general anesthesia and the chest was prepped and draped in a sterile fashion.  A timeout was performed and all information was confirmed to be correct.  Right side: Preoperative markings were confirmed.  Incision lines were injected with 1% Xylocaine with epinephrine.  After waiting for vasoconstriction, the marked lines were incised.  A Wise-pattern superomedial breast reduction was performed by de-epithelializing the pedicle, using bovie to create the superomedial pedicle, and removing breast tissue from the superior, lateral, and inferior portions of the breast.  Care was taken to not undermine the breast pedicle. Hemostasis was achieved.   The nipple was gently rotated into position and the soft tissue closed with 4-0 Monocryl.   The pocket was irrigated and hemostasis confirmed.  The deep tissues were approximated with 3-0 monocryl sutures and the skin was closed with deep dermal and subcuticular 4-0 Monocryl sutures.  The nipple and skin flaps had good capillary refill at the end of the procedure.    Left side: Preoperative markings were confirmed.  Incision lines were injected with 1% Xylocaine with epinephrine.  After waiting for vasoconstriction, the marked lines were incised.  A Wise-pattern superomedial breast reduction was performed by de-epithelializing the pedicle, using bovie to create the superomedial pedicle, and removing breast tissue from the superior, lateral, and inferior portions of the breast.  Care was taken to not undermine the breast pedicle. Hemostasis was achieved.  The nipple was gently rotated into position and the soft tissue was closed with 4-0 Monocryl.  The patient was sat upright and size and shape symmetry was confirmed.  The pocket was irrigated and hemostasis confirmed.  The deep tissues were approximated with 3-0 monocryl sutures and the skin was closed with deep dermal and subcuticular 4-0 Monocryl sutures.  Dermabond was applied.  A breast binder and ABDs were placed.  The nipple and skin flaps had good capillary refill at the end of the procedure.  The patient tolerated the procedure well. The patient was allowed to wake from anesthesia and taken to the recovery room in satisfactory condition

## 2019-03-12 NOTE — Anesthesia Preprocedure Evaluation (Addendum)
Anesthesia Evaluation  Patient identified by MRN, date of birth, ID band Patient awake    Reviewed: Allergy & Precautions, NPO status , Patient's Chart, lab work & pertinent test results  Airway Mallampati: II  TM Distance: >3 FB Neck ROM: Full    Dental  (+) Teeth Intact, Dental Advisory Given   Pulmonary neg pulmonary ROS,    breath sounds clear to auscultation       Cardiovascular hypertension,  Rhythm:Regular Rate:Normal     Neuro/Psych  Headaches, Depression    GI/Hepatic negative GI ROS, Neg liver ROS,   Endo/Other  negative endocrine ROS  Renal/GU negative Renal ROS     Musculoskeletal negative musculoskeletal ROS (+)   Abdominal Normal abdominal exam  (+)   Peds  Hematology negative hematology ROS (+)   Anesthesia Other Findings   Reproductive/Obstetrics                            COVID-19 Labs  No results for input(s): DDIMER, FERRITIN, LDH, CRP in the last 72 hours.  No results found for: SARSCOV2NAA    Anesthesia Physical Anesthesia Plan  ASA: II  Anesthesia Plan: General   Post-op Pain Management:    Induction: Intravenous  PONV Risk Score and Plan: 4 or greater and Ondansetron, Dexamethasone, Midazolam and Scopolamine patch - Pre-op  Airway Management Planned: Oral ETT  Additional Equipment: None  Intra-op Plan:   Post-operative Plan: Extubation in OR  Informed Consent: I have reviewed the patients History and Physical, chart, labs and discussed the procedure including the risks, benefits and alternatives for the proposed anesthesia with the patient or authorized representative who has indicated his/her understanding and acceptance.     Dental advisory given  Plan Discussed with: CRNA  Anesthesia Plan Comments:        Anesthesia Quick Evaluation

## 2019-03-12 NOTE — Anesthesia Procedure Notes (Signed)

## 2019-03-14 ENCOUNTER — Encounter (HOSPITAL_BASED_OUTPATIENT_CLINIC_OR_DEPARTMENT_OTHER): Payer: Self-pay | Admitting: Plastic Surgery

## 2019-03-14 NOTE — Anesthesia Postprocedure Evaluation (Signed)
Anesthesia Post Note  Patient: Christine Cervantes  Procedure(s) Performed: BILATERAL MAMMARY REDUCTION  (BREAST) (Bilateral Breast)     Patient location during evaluation: PACU Anesthesia Type: General Level of consciousness: awake and alert Pain management: pain level controlled Vital Signs Assessment: post-procedure vital signs reviewed and stable Respiratory status: spontaneous breathing, nonlabored ventilation, respiratory function stable and patient connected to nasal cannula oxygen Cardiovascular status: blood pressure returned to baseline and stable Postop Assessment: no apparent nausea or vomiting Anesthetic complications: no    Last Vitals:  Vitals:   03/12/19 1500 03/12/19 1530  BP: 127/79 120/82  Pulse: 90 96  Resp: (!) 22 18  Temp:  37.2 C  SpO2: 99% 97%    Pain Goal:                   Shelton Silvas

## 2019-03-21 ENCOUNTER — Encounter: Payer: Self-pay | Admitting: Plastic Surgery

## 2019-03-21 ENCOUNTER — Other Ambulatory Visit: Payer: Self-pay

## 2019-03-21 ENCOUNTER — Ambulatory Visit (INDEPENDENT_AMBULATORY_CARE_PROVIDER_SITE_OTHER): Payer: No Typology Code available for payment source | Admitting: Plastic Surgery

## 2019-03-21 VITALS — BP 139/86 | HR 78 | Temp 98.7°F | Ht 64.0 in | Wt 212.0 lb

## 2019-03-21 DIAGNOSIS — M542 Cervicalgia: Secondary | ICD-10-CM

## 2019-03-21 DIAGNOSIS — G8929 Other chronic pain: Secondary | ICD-10-CM

## 2019-03-21 DIAGNOSIS — M546 Pain in thoracic spine: Secondary | ICD-10-CM

## 2019-03-21 DIAGNOSIS — N62 Hypertrophy of breast: Secondary | ICD-10-CM

## 2019-03-21 NOTE — Progress Notes (Signed)
   Subjective:    Patient ID: Christine Cervantes, female    DOB: 1985-12-16, 33 y.o.   MRN: [id number]  The patient is a 33 yrs old bf here for follow up on her breast reduction.  She feels some tightness and concerned about the pressure.  She has fluid in the pocket.  Incisions intact and healing well.   Review of Systems  Constitutional: Negative.   HENT: Negative.   Eyes: Negative.   Respiratory: Negative.   Cardiovascular: Negative.   Gastrointestinal: Negative.   Musculoskeletal: Negative.        Objective:   Physical Exam Vitals signs and nursing note reviewed.  Constitutional:      Appearance: Normal appearance.  HENT:     Head: Normocephalic and atraumatic.  Pulmonary:     Effort: Pulmonary effort is normal.  Abdominal:     General: Abdomen is flat.  Neurological:     General: No focal deficit present.     Mental Status: She is alert and oriented to person, place, and time.  Psychiatric:        Mood and Affect: Mood normal.        Behavior: Behavior normal.       Assessment & Plan:     ICD-10-CM   1. Chronic bilateral thoracic back pain  M54.6    G89.29   2. Neck pain  M54.2   3. Symptomatic mammary hypertrophy  N62     I was able to aspirate 200 cc from the left breast pocket and 150 cc from the right.  Continue sports bra.

## 2019-03-28 ENCOUNTER — Ambulatory Visit (INDEPENDENT_AMBULATORY_CARE_PROVIDER_SITE_OTHER): Payer: No Typology Code available for payment source | Admitting: Plastic Surgery

## 2019-03-28 ENCOUNTER — Ambulatory Visit: Payer: No Typology Code available for payment source | Admitting: Plastic Surgery

## 2019-03-28 ENCOUNTER — Other Ambulatory Visit: Payer: Self-pay

## 2019-03-28 ENCOUNTER — Telehealth: Payer: No Typology Code available for payment source | Admitting: Nurse Practitioner

## 2019-03-28 ENCOUNTER — Encounter: Payer: Self-pay | Admitting: Plastic Surgery

## 2019-03-28 DIAGNOSIS — M542 Cervicalgia: Secondary | ICD-10-CM

## 2019-03-28 DIAGNOSIS — N62 Hypertrophy of breast: Secondary | ICD-10-CM

## 2019-03-28 MED ORDER — NAPROXEN 500 MG PO TABS: 500.0000 mg | ORAL_TABLET | Freq: Two times a day (BID) | ORAL | 0 refills | Status: DC

## 2019-03-28 MED ORDER — CYCLOBENZAPRINE HCL 10 MG PO TABS: 10.0000 mg | ORAL_TABLET | Freq: Three times a day (TID) | ORAL | 0 refills | Status: DC | PRN

## 2019-03-28 NOTE — Progress Notes (Signed)
We are sorry that you are not feeling well.  Here is how we plan to help!  Based on what you have shared with me it looks like you mostly have acute neck pain.  Acute back pain is defined as musculoskeletal pain that can resolve in 1-3 weeks with conservative treatment.  I have prescribed Naprosyn 500 mg twice a day non-steroid anti-inflammatory (NSAID) as well as Flexeril 10 mg every eight hours as needed which is a muscle relaxer  Some patients experience stomach irritation or in increased heartburn with anti-inflammatory drugs.  Please keep in mind that muscle relaxer's can cause fatigue and should not be taken while at work or driving.  Back pain is very common.  The pain often gets better over time.  The cause of back pain is usually not dangerous.  Most people can learn to manage their back pain on their own.  Home Care  Stay active.  Start with short walks on flat ground if you can.  Try to walk farther each day.  Do not sit, drive or stand in one place for more than 30 minutes.  Do not stay in bed.  Do not avoid exercise or work.  Activity can help your back heal faster.  Be careful when you bend or lift an object.  Bend at your knees, keep the object close to you, and do not twist.  Sleep on a firm mattress.  Lie on your side, and bend your knees.  If you lie on your back, put a pillow under your knees.  Only take medicines as told by your doctor.  Put ice on the injured area.  Put ice in a plastic bag  Place a towel between your skin and the bag  Leave the ice on for 15-20 minutes, 3-4 times a day for the first 2-3 days. 210 After that, you can switch between ice and heat packs.  Ask your doctor about back exercises or massage.  Avoid feeling anxious or stressed.  Find good ways to deal with stress, such as exercise.  Get Help Right Way If:  Your pain does not go away with rest or medicine.  Your pain does not go away in 1 week.  You have new problems.  You do not  feel well.  The pain spreads into your legs.  You cannot control when you poop (bowel movement) or pee (urinate)  You feel sick to your stomach (nauseous) or throw up (vomit)  You have belly (abdominal) pain.  You feel like you may pass out (faint).  If you develop a fever.  Make Sure you:  Understand these instructions.  Will watch your condition  Will get help right away if you are not doing well or get worse.  Your e-visit answers were reviewed by a board certified advanced clinical practitioner to complete your personal care plan.  Depending on the condition, your plan could have included both over the counter or prescription medications.  If there is a problem please reply  once you have received a response from your provider.  Your safety is important to [place].  If you have drug allergies check your prescription carefully.    You can use MyChart to ask questions about today's visit, request a non-urgent call back, or ask for a work or school excuse for 24 hours related to this e-Visit. If it has been greater than 24 hours you will need to follow up with your provider, or enter a new e-Visit to address  those concerns.  You will get an e-mail in the next two days asking about your experience.  I hope that your e-visit has been valuable and will speed your recovery. Thank you for using e-visits.  5-10 minutes spent reviewing and documenting in chart.

## 2019-03-28 NOTE — Progress Notes (Signed)
   Subjective:    Patient ID: Jenina Moening, female    DOB: 1985/11/14, 33 y.o.   MRN: [id number]  Patient is a 33 year old female here for follow-up on her bilateral breast reduction.  She is very pleased with the overall progress.  She noticed some fullness on the left breast and came for follow-up.  The incisions are healing nicely.  There is no sign of infection.  There does seem to be a little bit of fluid in the left breast pocket.  The right seems okay.  No sign of infection.     Review of Systems  Constitutional: Negative.   HENT: Negative.   Respiratory: Negative.   Cardiovascular: Negative.   Gastrointestinal: Negative.   Genitourinary: Negative.   Musculoskeletal: Negative.        Objective:   Physical Exam Vitals signs and nursing note reviewed.  Constitutional:      Appearance: Normal appearance.  HENT:     Head: Normocephalic and atraumatic.  Cardiovascular:     Rate and Rhythm: Normal rate.  Pulmonary:     Effort: Pulmonary effort is normal.  Neurological:     General: No focal deficit present.     Mental Status: She is alert.  Psychiatric:        Mood and Affect: Mood normal.        Behavior: Behavior normal.        Assessment & Plan:     ICD-10-CM   1. Macromastia  N62   2. Symptomatic mammary hypertrophy  N62     We were able to aspirate 150 cc of serous fluid from the left breast pocket.  Follow-up in 1 week.

## 2019-04-08 ENCOUNTER — Ambulatory Visit (INDEPENDENT_AMBULATORY_CARE_PROVIDER_SITE_OTHER): Payer: No Typology Code available for payment source | Admitting: Plastic Surgery

## 2019-04-08 ENCOUNTER — Encounter: Payer: Self-pay | Admitting: Plastic Surgery

## 2019-04-08 ENCOUNTER — Other Ambulatory Visit: Payer: Self-pay

## 2019-04-08 VITALS — BP 148/109 | HR 78 | Temp 98.7°F | Ht 64.0 in | Wt 215.8 lb

## 2019-04-08 DIAGNOSIS — N62 Hypertrophy of breast: Secondary | ICD-10-CM

## 2019-04-08 NOTE — Progress Notes (Signed)
Patient ID: Christine Haltermanda Mae Cervantes, female    DOB: 11-08-1985, 33 y.o.   MRN: [id number]   Chief Complaint  Patient presents with  . Follow-up    1 week for breast reduction   Christine Cervantes is a 33 yo female POD #27 s/p bilateral breast reduction. She presents today for follow up. She has had fluid aspirated from the left breast twice since surgery. She felt better after each aspiration. Patient states she started having increased pain in the left breast again 3 days ago. She describes the pain as soreness at her incision site. The pain is worse when lifting the breast. She is afraid the fluid is building back up. She is having some neck pain and was recently prescribed flexeril and naproxen by her PCP. Denies any fevers.     Review of Systems  Constitutional: Negative.        Left breast pain  HENT: Negative.   Respiratory: Negative.   Cardiovascular: Negative.   Gastrointestinal: Negative.   Genitourinary: Negative.   Musculoskeletal: Positive for neck pain.  Skin:       Breast incisions     Past Medical History:  Diagnosis Date  . Anemia   . Depression   . Headache(784.0)   . Pregnancy induced hypertension    no meds  . Vaginal Pap smear, abnormal     Past Surgical History:  Procedure Laterality Date  . BREAST REDUCTION SURGERY Bilateral 03/12/2019   Procedure: BILATERAL MAMMARY REDUCTION  (BREAST);  Surgeon: Peggye Formillingham, Claire S, DO;  Location: [facility];  Service: Plastics;  Laterality: Bilateral;  3.5 hours, please  . FOOT SURGERY  2004-2004   corns removed from both feet, hammertoes repaired  . GYNECOLOGIC CRYOSURGERY    . HAMMER TOE SURGERY        Current Outpatient Medications:  .  cyclobenzaprine (FLEXERIL) 10 MG tablet, Take 1 tablet (10 mg total) by mouth 3 (three) times daily as needed for muscle spasms., Disp: 30 tablet, Rfl: 0 .  HYDROcodone-acetaminophen (NORCO) 5-325 MG tablet, Take 1 tablet by mouth every 6 (six) hours as needed for moderate  pain., Disp: 30 tablet, Rfl: 0 .  ibuprofen (ADVIL,MOTRIN) 600 MG tablet, Take 1 tablet (600 mg total) by mouth every 6 (six) hours as needed for cramping., Disp: 30 tablet, Rfl: 2 .  loratadine-pseudoephedrine (CLARITIN-D 12-HOUR) 5-120 MG tablet, Take 1 tablet by mouth 2 (two) times daily., Disp: 12 tablet, Rfl: 0 .  Multiple Vitamin (MULTIVITAMIN WITH MINERALS) TABS tablet, Take 1 tablet by mouth daily., Disp: , Rfl:  .  naproxen (NAPROSYN) 500 MG tablet, Take 1 tablet (500 mg total) by mouth 2 (two) times daily with a meal., Disp: 60 tablet, Rfl: 0   Objective:   Vitals:   04/08/19 1411  BP: (!) 148/109  Pulse: 78  Temp: 98.7 F (37.1 C)  SpO2: 97%    Physical Exam  General: alert, calm, no acute distress Neck: supple, full ROM Chest: symmetrical rise and fall Lungs: unlabored breathing Breast: breast asymmetry, swelling of left breast, incisions approximated and covered with steri-strips Cardiac: +2 bilateral radial pulse, cap refill <3 sec Musculoskeletal: MAEx4 Neuro: A&O x3, calm, cooperative, steady gait    Assessment & Plan:  Symptomatic Mammary Hypertrophy  Christine Cervantes is a 33 yo female POD #27 s/p bilateral breast reduction. She has recurrent fluid collection in her left breast.  Her incisions are healing well. No signs of infection. We were able to aspirate 85  ml serous fluid from her left breast.     Alfredo Batty, NP

## 2019-04-18 ENCOUNTER — Ambulatory Visit (INDEPENDENT_AMBULATORY_CARE_PROVIDER_SITE_OTHER): Payer: No Typology Code available for payment source | Admitting: Surgical

## 2019-04-18 DIAGNOSIS — N62 Hypertrophy of breast: Secondary | ICD-10-CM

## 2019-04-18 NOTE — Progress Notes (Deleted)
   Subjective:     Patient ID: Christine Cervantes, female    DOB: [birth year], 33 y.o.   MRN: [id number]  No chief complaint on file.   HPI: The patient is a 33 y.o. yrs old female here for follow up after bilateral breast reduction.   Pain? Fullness? Naproxen and flexeril for neck pain?  3ml from L breast 04/08/19, today???  ROS   Objective:   Vital Signs LMP 04/01/2019 (Exact Date)  Vital Signs and Nursing Note Reviewed  Physical Exam    Assessment/Plan:  No diagnosis found.  ***   Carola Rhine Keerstin Bjelland, PA-C 04/18/2019, 7:45 AM

## 2019-04-22 ENCOUNTER — Encounter: Payer: Self-pay | Admitting: Plastic Surgery

## 2019-04-23 MED FILL — OMRON 3 SERIES BP MONITOR D: 30 days supply | Qty: 1 | Fill #0

## 2019-04-29 ENCOUNTER — Ambulatory Visit: Payer: No Typology Code available for payment source | Admitting: Plastic Surgery

## 2019-05-01 NOTE — Progress Notes (Signed)
   Subjective:     Patient ID: Christine Cervantes, female    DOB: 1985-12-28, 33 y.o.   MRN: [id number]  Chief Complaint  Patient presents with  . Follow-up    HPI: The patient is a 33 y.o. female here for follow-up after bilateral breast reduction on 03/12/2019 with Dr. Marla Roe.  She was last seen on 04/08/2019.   [name] is doing very well today.  She did have some questions about her left nipple areola incision.  The medial superior aspect of her left nipple has a incision that is a little thicker than the rest of the nipple areole complex incisions.  Her incisions are healing very well.  She also had some concerns/questions about her left breast being more full than the right breast.  She denies any fevers, chills, nausea, vomiting.    Review of Systems  Constitutional: Negative for chills and fever.  HENT: Negative.   Respiratory: Negative.   Cardiovascular: Negative.   Genitourinary: Negative.   Musculoskeletal: Negative.   Skin: Negative for itching and rash.     Objective:   Vital Signs BP (!) 136/93 (BP Location: Left Arm, Patient Position: Sitting)   Pulse 71   Temp 98.4 F (36.9 C)   Wt 213 lb 12.8 oz (97 kg)   SpO2 98%   BMI 36.70 kg/m  Vital Signs and Nursing Note Reviewed Chaperone present during exam. Physical Exam  Constitutional: She is oriented to person, place, and time and well-developed, well-nourished, and in no distress. No distress.  HENT:  Head: Normocephalic and atraumatic.  Neck: Normal range of motion.  Cardiovascular: Normal rate.  Pulmonary/Chest: Effort normal.    Musculoskeletal: Normal range of motion.        General: No edema.  Neurological: She is alert and oriented to person, place, and time. Gait normal.  Skin: Skin is warm and dry. No rash noted. She is not diaphoretic. No erythema. No pallor.  Psychiatric: Mood and affect normal.  Nursing note reviewed.   Assessment/Plan:     ICD-10-CM   1. Symptomatic mammary  hypertrophy  N62   2. Chronic bilateral thoracic back pain  M54.6    G89.29   3. Neck pain  M54.2     [name] is doing very well.  Her incisions are healing well.  No sign of infection, seroma or hematoma at this time.  In regards to the scarring around her left superior medial nipple areola incision she can massage this area firmly with Mederma.   She can continue to wear sports bras or bras without a wire.  Make sure they are not too tight to prevent excessive pressure.  The bra she had on today may have been a little too small for her.  Follow-up in 1 month.  Pictures were obtained of the patient and placed in the chart with the patient's or guardian's permission.  Carola Rhine Vernel Donlan, PA-C 05/02/2019, 10:58 AM

## 2019-05-02 ENCOUNTER — Encounter: Payer: Self-pay | Admitting: Surgical

## 2019-05-02 ENCOUNTER — Other Ambulatory Visit: Payer: Self-pay

## 2019-05-02 ENCOUNTER — Ambulatory Visit (INDEPENDENT_AMBULATORY_CARE_PROVIDER_SITE_OTHER): Payer: No Typology Code available for payment source | Admitting: Surgical

## 2019-05-02 VITALS — BP 136/93 | HR 71 | Temp 98.4°F | Wt 213.8 lb

## 2019-05-02 DIAGNOSIS — G8929 Other chronic pain: Secondary | ICD-10-CM

## 2019-05-02 DIAGNOSIS — N62 Hypertrophy of breast: Secondary | ICD-10-CM

## 2019-05-02 DIAGNOSIS — M546 Pain in thoracic spine: Secondary | ICD-10-CM

## 2019-05-02 DIAGNOSIS — M542 Cervicalgia: Secondary | ICD-10-CM

## 2019-05-05 ENCOUNTER — Ambulatory Visit: Payer: No Typology Code available for payment source

## 2019-05-22 NOTE — Progress Notes (Signed)
appt cancelled

## 2019-06-02 ENCOUNTER — Telehealth: Payer: Self-pay

## 2019-06-02 NOTE — Telephone Encounter (Signed)
error 

## 2019-06-03 ENCOUNTER — Ambulatory Visit (INDEPENDENT_AMBULATORY_CARE_PROVIDER_SITE_OTHER): Payer: No Typology Code available for payment source | Admitting: Surgical

## 2019-06-03 ENCOUNTER — Encounter: Payer: Self-pay | Admitting: Surgical

## 2019-06-03 DIAGNOSIS — N62 Hypertrophy of breast: Secondary | ICD-10-CM

## 2019-06-03 NOTE — Progress Notes (Signed)
No show

## 2019-06-25 ENCOUNTER — Telehealth: Payer: No Typology Code available for payment source | Admitting: Nurse Practitioner

## 2019-06-25 DIAGNOSIS — J029 Acute pharyngitis, unspecified: Secondary | ICD-10-CM

## 2019-06-25 NOTE — Progress Notes (Signed)
Repeated e visit 

## 2019-06-25 NOTE — Progress Notes (Signed)

## 2019-06-28 ENCOUNTER — Telehealth: Payer: No Typology Code available for payment source | Admitting: Family

## 2019-06-28 DIAGNOSIS — R059 Cough, unspecified: Secondary | ICD-10-CM

## 2019-06-28 DIAGNOSIS — R05 Cough: Secondary | ICD-10-CM | POA: Diagnosis not present

## 2019-06-28 MED ORDER — BENZONATATE 100 MG PO CAPS: 100.0000 mg | ORAL_CAPSULE | Freq: Three times a day (TID) | ORAL | 0 refills | Status: DC | PRN

## 2019-06-28 NOTE — Progress Notes (Signed)
E-Visit for Corona Virus Screening   Your current symptoms could be consistent with the coronavirus.  Many health care providers can now test patients at their office but not all are.  [facility] has multiple testing sites. For information on our COVID testing locations and hours go to [email or web address]  Please quarantine yourself while awaiting your test results.  We are enrolling you in our MyChart Home Montioring for COVID19 . Daily you will receive a questionnaire within the MyChart website. Our COVID 19 response team willl be monitoriing your responses daily.  Approximately 5 minutes was spent documenting and reviewing patient's chart.   You can go to one of the  testing sites listed below, while they are opened (see hours). You do not need an order and will stay in your car during the test. You do need to self isolate until your results return and if positive 14 days from when your symptoms started and until you are 3 days symptom free.   Testing Locations (Monday - Friday, 8 a.m. - 3:30 p.m.) . [place]: [facility], [address], [place], [place]  . [place]: [facility], [address], [place], [place] (entrance [address])  . [place]: (Closed each Monday): Testing site relocated to the short stay covered drive at [facility]. (Use the [address] entrance to [facility] next to [facility].)   COVID-19 is a respiratory illness with symptoms that are similar to the flu. Symptoms are typically mild to moderate, but there have been cases of severe illness and death due to the virus. The following symptoms may appear 2-14 days after exposure: . Fever . Cough . Shortness of breath or difficulty breathing . Chills . Repeated shaking with chills . Muscle pain . Headache . Sore throat . New loss of taste or smell . Fatigue . Congestion or runny  nose . Nausea or vomiting . Diarrhea  It is vitally important that if you feel that you have an infection such as this virus or any other virus that you stay home and away from places where you may spread it to others.  You should self-quarantine for 14 days if you have symptoms that could potentially be coronavirus or have been in close contact a with a person diagnosed with COVID-19 within the last 2 weeks. You should avoid contact with people age 65 and older.   You should wear a mask or cloth face covering over your nose and mouth if you must be around other people or animals, including pets (even at home). Try to stay at least 6 feet away from other people. This will protect the people around you.  You can use medication such as A prescription cough medication called Tessalon Perles 100 mg. You may take 1-2 capsules every 8 hours as needed for cough  You may also take acetaminophen (Tylenol) as needed for fever.   Reduce your risk of any infection by using the same precautions used for avoiding the common cold or flu:  . Wash your hands often with soap and warm water for at least 20 seconds.  If soap and water are not readily available, use an alcohol-based hand sanitizer with at least 60% alcohol.  . If coughing or sneezing, cover your mouth and nose by coughing or sneezing into the elbow areas of your shirt or coat, into a tissue or into your sleeve (not your hands). . Avoid shaking hands with   others and consider head nods or verbal greetings only. . Avoid touching your eyes, nose, or mouth with unwashed hands.  . Avoid close contact with people who are sick. . Avoid places or events with large numbers of people in one location, like concerts or sporting events. . Carefully consider travel plans you have or are making. . If you are planning any travel outside or inside the [place], visit the CDC's Travelers' Health webpage for the latest health notices. . If you have some symptoms but not all  symptoms, continue to monitor at home and seek medical attention if your symptoms worsen. . If you are having a medical emergency, call 911.  HOME CARE . Only take medications as instructed by your medical team. . Drink plenty of fluids and get plenty of rest. . A steam or ultrasonic humidifier can help if you have congestion.   GET HELP RIGHT AWAY IF YOU HAVE EMERGENCY WARNING SIGNS** FOR COVID-19. If you or someone is showing any of these signs seek emergency medical care immediately. Call 911 or proceed to your closest emergency facility if: . You develop worsening high fever. . Trouble breathing . Bluish lips or face . Persistent pain or pressure in the chest . New confusion . Inability to wake or stay awake . You cough up blood. . Your symptoms become more severe  **This list is not all possible symptoms. Contact your medical provider for any symptoms that are sever or concerning to you.   MAKE SURE YOU   Understand these instructions.  Will watch your condition.  Will get help right away if you are not doing well or get worse.  Your e-visit answers were reviewed by a board certified advanced clinical practitioner to complete your personal care plan.  Depending on the condition, your plan could have included both over the counter or prescription medications.  If there is a problem please reply once you have received a response from your provider.  Your safety is important to [place].  If you have drug allergies check your prescription carefully.    You can use MyChart to ask questions about today's visit, request a non-urgent call back, or ask for a work or school excuse for 24 hours related to this e-Visit. If it has been greater than 24 hours you will need to follow up with your provider, or enter a new e-Visit to address those concerns. You will get an e-mail in the next two days asking about your experience.  I hope that your e-visit has been valuable and will speed your  recovery. Thank you for using e-visits.

## 2019-08-04 ENCOUNTER — Other Ambulatory Visit (HOSPITAL_COMMUNITY)
Admission: RE | Admit: 2019-08-04 | Discharge: 2019-08-04 | Disposition: A | Discharge status: Home / Self Care | Payer: No Typology Code available for payment source | Source: Ambulatory Visit | Attending: Family Medicine | Admitting: Family Medicine

## 2019-08-04 ENCOUNTER — Ambulatory Visit (INDEPENDENT_AMBULATORY_CARE_PROVIDER_SITE_OTHER)
Payer: No Typology Code available for payment source | Admitting: Student in an Organized Health Care Education/Training Program

## 2019-08-04 ENCOUNTER — Encounter: Payer: Self-pay | Admitting: Student in an Organized Health Care Education/Training Program

## 2019-08-04 ENCOUNTER — Ambulatory Visit
Admission: RE | Admit: 2019-08-04 | Discharge: 2019-08-04 | Disposition: A | Discharge status: Home / Self Care | Payer: No Typology Code available for payment source | Source: Ambulatory Visit | Attending: Family Medicine | Admitting: Family Medicine

## 2019-08-04 ENCOUNTER — Other Ambulatory Visit: Payer: Self-pay

## 2019-08-04 VITALS — BP 120/80 | HR 81 | Wt 213.0 lb

## 2019-08-04 DIAGNOSIS — M25572 Pain in left ankle and joints of left foot: Secondary | ICD-10-CM

## 2019-08-04 DIAGNOSIS — M79672 Pain in left foot: Secondary | ICD-10-CM

## 2019-08-04 DIAGNOSIS — Z23 Encounter for immunization: Secondary | ICD-10-CM

## 2019-08-04 DIAGNOSIS — Z124 Encounter for screening for malignant neoplasm of cervix: Secondary | ICD-10-CM

## 2019-08-04 DIAGNOSIS — G8929 Other chronic pain: Secondary | ICD-10-CM

## 2019-08-04 DIAGNOSIS — Z113 Encounter for screening for infections with a predominantly sexual mode of transmission: Secondary | ICD-10-CM

## 2019-08-04 DIAGNOSIS — M25579 Pain in unspecified ankle and joints of unspecified foot: Secondary | ICD-10-CM | POA: Insufficient documentation

## 2019-08-04 DIAGNOSIS — I10 Essential (primary) hypertension: Secondary | ICD-10-CM | POA: Diagnosis not present

## 2019-08-04 LAB — POCT WET PREP (WET MOUNT)
Clue Cells Wet Prep Whiff POC: NEGATIVE
Trichomonas Wet Prep HPF POC: ABSENT

## 2019-08-04 LAB — POCT URINE PREGNANCY: Preg Test, Ur: NEGATIVE

## 2019-08-04 NOTE — Assessment & Plan Note (Addendum)
With normal exam and chronic history after remote mild injury. Recommended patient attempt at home strengthening exercises for ankle and provided with handout. Requested patient attempt to do these exercises several times per day for a few weeks to see if can improve her symptoms - patient was very addiment that she get an xray so ordered and will review and treat as appropriate.  -the xray was negative for any pathology  - patient also very addiment that she get a podiatry referral which I do not think is appropriate at this time - if not improved, return for further intervention. Likely- physical therapy, possibly a brace or referral

## 2019-08-04 NOTE — Assessment & Plan Note (Signed)
-  Last pap 2018 positive for LSIL and +HPV and  -07/26/2017 colpo showing 3 o'clock HGSIL/CIN-II. -01/2018 LEEP showed LGSIL/CIN-I - repeated pap today

## 2019-08-04 NOTE — Progress Notes (Addendum)
Subjective:    Patient ID: Kaisey Huseby, female    DOB: 1986/06/03, 33 y.o.   MRN: [id number]  CC: STD check, ankle pain, kidney check  HPI:  STD check- Patient states that she is asymptomatic but has a new sexual partner and would like to be screened for STDs at this time.  She is exclusive with 1 female partner and does not know if he is exclusive with her.  They do not use any form of birth control or barrier protection.  She is not actively preventing pregnancy and they practice the "pull out" method.  Her last period was at the beginning of this month and was regular for her.  She also had some mild spotting last week which has now resolved. occasionally has painful bumps in her genital area and attributes these to shaving her pubic hair and ingrown hairs.  History of BV.  She uses boric acid inserts monthly around her menses.  Ankle pain- patient rolled her ankle on a step in 2017 and had difficulty bearing weight at that time.  Patient was never seen by her provider for the injury as she did not have insurance and had improvement in her symptoms spontaneously.  She has noticed that that ankle occasionally has pain when she is walking which is described as severe and burning in her heel and ankle and again resolved spontaneously.  Worsened with increased use and improved with rest.  She would like to see the same podiatrist that her mother goes to.  Kidney check-patient states she has a strong family history of kidney disease in her mother was recently diagnosed so the patient is very concerned that she also will have kidney issues and wants her kidneys checked today.  She denies any change in urine output or CVA tenderness.  Smoking status reviewed   ROS: pertinent noted in the HPI   I have personally reviewed pertinent past medical history, surgical, family, and social history as appropriate.  Objective:  BP 120/80   Pulse 81   Wt 213 lb (96.6 kg)   LMP 07/10/2019 (Approximate)    SpO2 98%   BMI 36.56 kg/m   Vitals and nursing note reviewed  General: NAD, pleasant, able to participate in exam Pelvic: external genitalia free of lesions. Positive for genital peircing. Vaginal canal positive for excessive thin, white discharge. Cervix has post-colposcopy changes with irregular appearance and mild bleeding with pap smear.  Extremities: left foot negative for edema. Range of motion intact and patient able to ambulate on foot without limp Skin: warm and dry, no rashes noted Neuro: alert, no obvious focal deficits Psych: Normal affect and mood  Assessment & Plan:   Cervical cancer screening -Last pap 2018 positive for LSIL and +HPV and  -07/26/2017 colpo showing 3 o'clock HGSIL/CIN-II. -01/2018 LEEP showed LGSIL/CIN-I - repeated pap today  Screen for STD (sexually transmitted disease) Collected sample for wet prep showing few yeast GC/CT collected., HIV, RPR, HSV labs collected Urine pregnancy test negative - recommended patient take prenatal vitamin given high likelihood of unplanned pregnancy   Ankle pain, chronic With normal exam and chronic history after remote mild injury. Recommended patient attempt at home strengthening exercises for ankle and provided with handout. Requested patient attempt to do these exercises several times per day for a few weeks to see if can improve her symptoms - patient was very addiment that she get an xray so ordered and will review and treat as appropriate.  -the xray was  negative for any pathology  - patient also very addiment that she get a podiatry referral which I do not think is appropriate at this time - if not improved, return for further intervention. Likely- physical therapy, possibly a brace or referral  Orders Placed This Encounter  Procedures  . DG Foot Complete Left    Standing Status:   Future    Number of Occurrences:   1    Standing Expiration Date:   10/03/2020    Order Specific Question:   Reason for Exam  (SYMPTOM  OR DIAGNOSIS REQUIRED)    Answer:   foot pain    Order Specific Question:   Is patient pregnant?    Answer:   Unknown (Please Explain)    Comments:   pregnancy test pending    Order Specific Question:   Preferred imaging location?    Answer:   [id number]    Order Specific Question:   Radiology Contrast Protocol - do NOT remove file path    Answer:   [email or web address]  . Flu Vaccine QUAD 36+ mos IM  . HIV antibody (with reflex)  . RPR  . HSV(herpes simplex vrs) 1+2 ab-IgG  . Basic Metabolic Panel  . POCT urine pregnancy  . POCT Wet Prep [facility] [place])   Doristine Mango, [facility] PGY-2

## 2019-08-04 NOTE — Patient Instructions (Addendum)
It was a pleasure to see you today!   To summarize our discussion for this visit:  We completed your Pap smear and and STD screening today.  Thank you for getting your flu vaccine today that was a great decision.  We will check your kidney function today.  I highly recommend taking a daily prenatal vitamin at this time.  For your foot, I am recommending some strengthening exercises that you can do at home and if those do not improve your symptoms then I continue to physical therapy.  Some additional health maintenance measures we should update are: Health Maintenance Due  Topic Date Due  . INFLUENZA VACCINE  05/10/2019  .   Please return to our clinic to see me as needed.  Call the clinic at [phone] if your symptoms worsen or you have any concerns.   Thank you for allowing me to take part in your care,  Dr. Jamelle Rushing    Ankle Exercises Ask your health care provider which exercises are safe for you. Do exercises exactly as told by your health care provider and adjust them as directed. It is normal to feel mild stretching, pulling, tightness, or mild discomfort as you do these exercises. Stop right away if you feel sudden pain or your pain gets worse. Do not begin these exercises until told by your health care provider. Stretching and range-of-motion exercises These exercises warm up your muscles and joints and improve the movement and flexibility of your ankle. These exercises may also help to relieve pain. Dorsiflexion/plantar flexion  1. Sit with your __________ knee straight or bent. Do not rest your foot on anything. 2. Flex your __________ ankle to tilt the top of your foot toward your shin. This is called dorsiflexion. 3. Hold this position for __________ seconds. 4. Point your toes downward to tilt the top of your foot away from your shin. This is called plantar flexion. 5. Hold this position for __________ seconds. Repeat __________ times. Complete this  exercise __________ times a day. Ankle alphabet  1. Sit with your __________ foot supported at your lower leg. ? Do not rest your foot on anything. ? Make sure your foot has room to move freely. 2. Think of your __________ foot as a paintbrush: ? Move your foot to trace each letter of the alphabet in the air. Keep your hip and knee still while you trace the letters. Trace every letter from A to Z. ? Make the letters as large as you can without causing or increasing any discomfort. Repeat __________ times. Complete this exercise __________ times a day. Passive ankle dorsiflexion This is an exercise in which something or someone moves your ankle for you. You do not move it yourself. 1. Sit on a chair that is placed on a non-carpeted surface. 2. Place your __________ foot on the floor, directly under your __________ knee. Extend your __________ leg for support. 3. Keeping your heel down, slide your __________ foot back toward the chair until you feel a stretch at your ankle or calf. If you do not feel a stretch, slide your buttocks forward to the edge of the chair while keeping your heel down. 4. Hold this stretch for __________ seconds. Repeat __________ times. Complete this exercise __________ times a day. Strengthening exercises These exercises build strength and endurance in your ankle. Endurance is the ability to use your muscles for a long time, even after they get tired. Dorsiflexors These are muscles that lift your foot up. 1. Secure  a rubber exercise band or tube to an object, such as a table leg, that will stay still when the band is pulled. Secure the other end around your __________ foot. 2. Sit on the floor, facing the object with your __________ leg extended. The band or tube should be slightly tense when your foot is relaxed. 3. Slowly flex your __________ ankle and toes to bring your foot toward your shin. 4. Hold this position for __________ seconds. 5. Slowly return your foot  to the starting position, controlling the band as you do that. Repeat __________ times. Complete this exercise __________ times a day. Plantar flexors These are muscles that push your foot down. 1. Sit on the floor with your __________ leg extended. 2. Loop a rubber exercise band or tube around the ball of your __________ foot. The ball of your foot is on the walking surface, right under your toes. The band or tube should be slightly tense when your foot is relaxed. 3. Slowly point your toes downward, pushing them away from you. 4. Hold this position for __________ seconds. 5. Slowly release the tension in the band or tube, controlling smoothly until your foot is back in the starting position. Repeat __________ times. Complete this exercise __________ times a day. Towel curls  1. Sit in a chair on a non-carpeted surface, and put your feet on the floor. 2. Place a towel in front of your feet. If told by your health care provider, add a __________ pound weight to the end of the towel. 3. Keeping your heel on the floor, put your __________ foot on the towel. 4. Pull the towel toward you by grabbing the towel with your toes and curling them under. Keep your heel on the floor. 5. Let your toes relax. 6. Grab the towel again. Keep pulling the towel until it is completely underneath your foot. Repeat __________ times. Complete this exercise __________ times a day. Standing plantar flexion This is an exercise in which you use your toes to lift your body's weight while standing. 1. Stand with your feet shoulder-width apart. 2. Keep your weight spread evenly over the width of your feet while you rise up on your toes. Use a wall or table to steady yourself if needed, but try not to use it for support. 3. If this exercise is too easy, try these options: ? Shift your weight toward your __________ leg until you feel challenged. ? If told by your health care provider, lift your uninjured leg off the floor.  4. Hold this position for __________ seconds. Repeat __________ times. Complete this exercise __________ times a day. Tandem walking 1. Stand with one foot directly in front of the other. 2. Slowly raise your back foot up, lifting your heel before your toes, and place it directly in front of your other foot. 3. Continue to walk in this heel-to-toe way for __________ or for as long as told by your health care provider. Have a countertop or wall nearby to use if needed to keep your balance, but try not to hold onto anything for support. Repeat __________ times. Complete this exercise __________ times a day. This information is not intended to replace advice given to you by your health care provider. Make sure you discuss any questions you have with your health care provider. Document Released: 08/09/2005 Document Revised: 06/22/2018 Document Reviewed: 06/24/2018 Elsevier Patient Education  2020 [employer].

## 2019-08-04 NOTE — Assessment & Plan Note (Signed)
Collected sample for wet prep showing few yeast GC/CT collected., HIV, RPR, HSV labs collected Urine pregnancy test negative - recommended patient take prenatal vitamin given high likelihood of unplanned pregnancy

## 2019-08-05 ENCOUNTER — Encounter: Payer: Self-pay | Admitting: Student in an Organized Health Care Education/Training Program

## 2019-08-05 DIAGNOSIS — B009 Herpesviral infection, unspecified: Secondary | ICD-10-CM | POA: Insufficient documentation

## 2019-08-05 LAB — BASIC METABOLIC PANEL
BUN/Creatinine Ratio: 16 (ref 9–23)
BUN: 13 mg/dL (ref 6–20)
CO2: 22 mmol/L (ref 20–29)
Calcium: 8.7 mg/dL (ref 8.7–10.2)
Chloride: 100 mmol/L (ref 96–106)
Creatinine, Ser: 0.82 mg/dL (ref 0.57–1.00)
GFR calc Af Amer: 107 mL/min/{1.73_m2} (ref >59)
GFR calc non Af Amer: 93 mL/min/{1.73_m2} (ref >59)
Glucose: 86 mg/dL (ref 65–99)
Potassium: 3.5 mmol/L (ref 3.5–5.2)
Sodium: 137 mmol/L (ref 134–144)

## 2019-08-05 LAB — HSV(HERPES SIMPLEX VRS) I + II AB-IGG
HSV 1 Glycoprotein G Ab, IgG: <0.91 index (ref 0.00–0.90)
HSV 2 IgG, Type Spec: >23.6 index — ABNORMAL HIGH (ref 0.00–0.90)

## 2019-08-05 LAB — HIV ANTIBODY (ROUTINE TESTING W REFLEX): HIV Screen 4th Generation wRfx: NONREACTIVE

## 2019-08-05 LAB — RPR: RPR Ser Ql: NONREACTIVE

## 2019-08-06 LAB — CYTOLOGY - PAP
Adequacy: ABSENT
Chlamydia: NEGATIVE
Comment: NEGATIVE
Comment: NORMAL
Diagnosis: NEGATIVE
Neisseria Gonorrhea: NEGATIVE

## 2019-08-18 ENCOUNTER — Encounter: Payer: Self-pay | Admitting: *Deleted

## 2019-09-29 ENCOUNTER — Other Ambulatory Visit: Payer: Self-pay | Admitting: Student in an Organized Health Care Education/Training Program

## 2019-09-29 DIAGNOSIS — I10 Essential (primary) hypertension: Secondary | ICD-10-CM

## 2019-10-07 ENCOUNTER — Encounter: Payer: Self-pay | Admitting: Student in an Organized Health Care Education/Training Program

## 2019-10-12 ENCOUNTER — Telehealth: Payer: Self-pay | Admitting: Family Medicine

## 2019-10-12 NOTE — Telephone Encounter (Signed)
FPTS After Hours Note  Ms. Hubble reports that starting about 2 days ago, she began to experience photophobia from her right eye.  She also says that her eye appears pink and feels scratchy.  She has had clear, serous drainage from the right eye and denies mucopurulent drainage.  She has felt overall well but does report significant discomfort from her eye.  She has tried Tylenol and ibuprofen with minimal relief.  She says that she went to [place] on New Year's Eve and wonders if she contracted the infection there.  She is wondering if this is a contagious illness and whether it is safe for her to go to work on Monday, January 4.  I counseled Ms. Mitter that she likely has viral conjunctivitis and told her that supportive measures such as pain control and using warm compresses to wipe away drainage and prevent matting of the eyelid are the best treatment for this.  This is a contagious condition although it is self-limited and not severe, but she may pass it onto coworkers if she goes to work on Monday.  I provided her with a work note that she can use if she her symptoms persist January 4 and 5.  I encouraged her to call us or make an appointment if she experiences vision changes, systemic illness, or markedly increased eye pain and drainage.  She expressed agreement with this plan.  Will forward to PCP as an FYI.  Kalifa C. Frances Furbish, MD PGY-3, [facility] 10/12/2019 12:46 AM

## 2019-11-25 ENCOUNTER — Encounter: Payer: Self-pay | Admitting: Podiatry

## 2019-11-25 ENCOUNTER — Telehealth: Payer: Self-pay | Admitting: Student in an Organized Health Care Education/Training Program

## 2019-11-25 ENCOUNTER — Ambulatory Visit: Payer: No Typology Code available for payment source | Admitting: Podiatry

## 2019-11-25 ENCOUNTER — Ambulatory Visit (INDEPENDENT_AMBULATORY_CARE_PROVIDER_SITE_OTHER): Payer: No Typology Code available for payment source

## 2019-11-25 ENCOUNTER — Other Ambulatory Visit: Payer: Self-pay

## 2019-11-25 VITALS — BP 104/73 | HR 72 | Resp 16

## 2019-11-25 DIAGNOSIS — M722 Plantar fascial fibromatosis: Secondary | ICD-10-CM

## 2019-11-25 DIAGNOSIS — M79672 Pain in left foot: Secondary | ICD-10-CM

## 2019-11-25 MED ORDER — MELOXICAM 15 MG PO TABS: 15.0000 mg | ORAL_TABLET | Freq: Every day | ORAL | 3 refills | Status: DC

## 2019-11-25 MED ORDER — METHYLPREDNISOLONE 4 MG PO TBPK: ORAL_TABLET | ORAL | 0 refills | Status: DC

## 2019-11-25 MED FILL — MELOXICAM 15 MG TABLET: 15 | 30 days supply | Qty: 30 | Fill #0

## 2019-11-25 MED FILL — METHYLPREDNISOLONE 4 MG TBP: 4 | 6 days supply | Qty: 21 | Fill #0

## 2019-11-25 NOTE — Telephone Encounter (Signed)
Pt is calling for a referral for her foot. She had X-rays done last year and also did stretching exercise that you told her to do. This has not gotten any better. She has an appointment at [facility]. Today. jw

## 2019-11-25 NOTE — Progress Notes (Signed)
Subjective:  Patient ID: Christine Cervantes, female    DOB: [birth year],  MRN: [id number] HPI Chief Complaint  Patient presents with  . Foot Pain    Plantar heel left - aching x 1 year, did have an injury 1 year+ ago that could have possibly contributed to this issue, tried Ibuprofen, ice, massage, soaking-no help  . New Patient (Initial Visit)    34 y.o. female presents with the above complaint.   ROS: Denies fever chills nausea vomiting muscle aches pains calf pain back pain chest pain shortness of breath.  Past Medical History:  Diagnosis Date  . Anemia   . Depression   . Headache(784.0)   . Pregnancy induced hypertension    no meds  . Vaginal Pap smear, abnormal    Past Surgical History:  Procedure Laterality Date  . BREAST REDUCTION SURGERY Bilateral 03/12/2019   Procedure: BILATERAL MAMMARY REDUCTION  (BREAST);  Surgeon: Peggye Form, DO;  Location: [facility];  Service: Plastics;  Laterality: Bilateral;  3.5 hours, please  . FOOT SURGERY  2004-2004   corns removed from both feet, hammertoes repaired  . GYNECOLOGIC CRYOSURGERY    . HAMMER TOE SURGERY      Current Outpatient Medications:  .  potassium chloride (MICRO-K) 10 MEQ CR capsule, Take 10 mEq by mouth 2 (two) times daily., Disp: , Rfl:  .  hydrochlorothiazide (HYDRODIURIL) 12.5 MG tablet, Take 1 tablet by mouth daily, Disp: 90 tablet, Rfl: 0 .  meloxicam (MOBIC) 15 MG tablet, Take 1 tablet (15 mg total) by mouth daily., Disp: 30 tablet, Rfl: 3 .  methylPREDNISolone (MEDROL DOSEPAK) 4 MG TBPK tablet, 6 day dose pack - take as directed, Disp: 21 tablet, Rfl: 0 .  Multiple Vitamin (MULTIVITAMIN WITH MINERALS) TABS tablet, Take 1 tablet by mouth daily., Disp: , Rfl:   Allergies  Allergen Reactions  . Latex Swelling and Rash    vaginally   Review of Systems Objective:   Vitals:   11/25/19 1438  BP: 104/73  Pulse: 72  Resp: 16    General: Well developed, nourished, in no acute distress,  alert and oriented x3   Dermatological: Skin is warm, dry and supple bilateral. Nails x 10 are well maintained; remaining integument appears unremarkable at this time. There are no open sores, no preulcerative lesions, no rash or signs of infection present.  Vascular: Dorsalis Pedis artery and Posterior Tibial artery pedal pulses are 2/4 bilateral with immedate capillary fill time. Pedal hair growth present. No varicosities and no lower extremity edema present bilateral.   Neruologic: Grossly intact via light touch bilateral. Vibratory intact via tuning fork bilateral. Protective threshold with Semmes Wienstein monofilament intact to all pedal sites bilateral. Patellar and Achilles deep tendon reflexes 2+ bilateral. No Babinski or clonus noted bilateral.   Musculoskeletal: No gross boney pedal deformities bilateral. No pain, crepitus, or limitation noted with foot and ankle range of motion bilateral. Muscular strength 5/5 in all groups tested bilateral.  Pain on palpation medial calcaneal tubercle of the left heel.  Gait: Unassisted, Nonantalgic.    Radiographs:  Radiographs taken today demonstrate an osseously mature individual with an increase in soft tissue at the plantar fashion calcaneal insertion site.  This is indicative of plantar fasciitis.  All other soft tissue margins appear normal all other osseous architecture appears normal.  Assessment & Plan:   Assessment: Plan fasciitis   Plan: We discussed etiology pathology conservative surgical therapies injected the left heel today start her on a  Medrol Dosepak placed on plantar fascial brace and a night splint.  She will be taking meloxicam once she is done with the Medrol Dosepak.  Discussed appropriate shoe gear stretching size ice therapy and shoe modifications.     Christine Cervantes T. [place], [place]

## 2019-11-25 NOTE — Patient Instructions (Signed)

## 2019-12-22 ENCOUNTER — Telehealth: Payer: Self-pay | Admitting: Student in an Organized Health Care Education/Training Program

## 2019-12-22 ENCOUNTER — Encounter: Payer: Self-pay | Admitting: Family Medicine

## 2019-12-22 ENCOUNTER — Ambulatory Visit (HOSPITAL_COMMUNITY)
Admission: RE | Admit: 2019-12-22 | Discharge: 2019-12-22 | Disposition: A | Discharge status: Home / Self Care | Payer: 59 | Source: Ambulatory Visit | Attending: Family Medicine | Admitting: Family Medicine

## 2019-12-22 ENCOUNTER — Ambulatory Visit (INDEPENDENT_AMBULATORY_CARE_PROVIDER_SITE_OTHER): Payer: 59 | Admitting: Family Medicine

## 2019-12-22 ENCOUNTER — Other Ambulatory Visit: Payer: Self-pay

## 2019-12-22 VITALS — BP 138/88 | HR 78 | Wt 205.0 lb

## 2019-12-22 DIAGNOSIS — I10 Essential (primary) hypertension: Secondary | ICD-10-CM

## 2019-12-22 DIAGNOSIS — Z0000 Encounter for general adult medical examination without abnormal findings: Secondary | ICD-10-CM | POA: Diagnosis not present

## 2019-12-22 DIAGNOSIS — D509 Iron deficiency anemia, unspecified: Secondary | ICD-10-CM | POA: Diagnosis not present

## 2019-12-22 NOTE — Patient Instructions (Signed)
It was very nice to meet you today. Please enjoy the rest of your week. We will notify you of your lab work results through my chart.   Please call the clinic at [phone] if you have any concerns. It was our pleasure to serve you.

## 2019-12-22 NOTE — Telephone Encounter (Signed)
Pt. Presented form for Medical Clearance Report to be completed.   Last appt was 12/22/19. Form place in the red team folder

## 2019-12-22 NOTE — Telephone Encounter (Signed)
Reviewed form for surgery clearance and placed in PCP's box for completion.  Glennie Hawk, CMA

## 2019-12-22 NOTE — Progress Notes (Signed)
    SUBJECTIVE:   CHIEF COMPLAINT / HPI:  [name] is a 34 yo female presenting today to for a physical exam prior to an elective surgery. She does not have any specific concerns today. She plans to get liposuction in April done by a doctor in [place]. They will do a fat transfer from her upper back and hips to her buttocks. Did not take her blood pressure medication today. Not endorsing headache, vision changes, or chest pain.  PERTINENT  PMH / PSH: Iron deficiency anemia, HSV2, CIN II  OBJECTIVE:   BP 138/88   Pulse 78   Wt 205 lb (93 kg)   LMP 12/19/2019 (Exact Date)   SpO2 100%   BMI 35.19 kg/m    General: Appears well, no acute distress. Age appropriate. Cardiac: RRR, 3/6 crescendo-decrescendo murmur heard best at RSB. Respiratory: CTAB, normal effort Abdomen: soft, nontender, non distended, +BS Extremities: No edema or cyanosis. Skin: Warm and dry, no rashes noted  EKG: normal sinus rhythm, there are no previous tracings available for comparison.  ASSESSMENT/PLAN:   Anemia, iron deficiency Hemoglobin 12.2 -Continue multivitamin -Consider starting iron 325mg  if hemoglobin drops with current supplemenation  Essential hypertension BP 138/88. Heart murmur heard on exam not previously noted in this patient's history. EKG NSR. Patient is asymptomatic. Etiology unknown at this time but could be due to her hypertensive state. Other things to consider if patient becomes symptomatic would be aortic stenosis or cardiomyopathy. -Continue HCTZ 12.5mg  -Consider echo if patient become symptomatic  Encounter for annual physical exam Doing well. No concerns at this time. CBC, CMP, EKG wnl. HIV NR. Heart murmur heard on exam not previously noted in this patient's history. Patient is asymptomatic. Etiology unknown at this time but could be due to her hypertensive state. Other things to consider if patient becomes symptomatic would be aortic stenosis or cardiomyopathy. Plans to have lipo  suction surgery in April in May. [place] MICA risk is 0.1%. -Consider echo if patient becomes symptomatic -Follow up in 1 year or sooner if needed   Chales Abrahams, DO [facility]

## 2019-12-23 ENCOUNTER — Telehealth: Payer: Self-pay | Admitting: *Deleted

## 2019-12-23 LAB — COMPREHENSIVE METABOLIC PANEL
ALT: 8 IU/L (ref 0–32)
AST: 12 IU/L (ref 0–40)
Albumin/Globulin Ratio: 1.7 (ref 1.2–2.2)
Albumin: 4.3 g/dL (ref 3.8–4.8)
Alkaline Phosphatase: 55 IU/L (ref 39–117)
BUN/Creatinine Ratio: 9 (ref 9–23)
BUN: 8 mg/dL (ref 6–20)
Bilirubin Total: 0.3 mg/dL (ref 0.0–1.2)
CO2: 24 mmol/L (ref 20–29)
Calcium: 8.8 mg/dL (ref 8.7–10.2)
Chloride: 104 mmol/L (ref 96–106)
Creatinine, Ser: 0.87 mg/dL (ref 0.57–1.00)
GFR calc Af Amer: 100 mL/min/{1.73_m2} (ref >59)
GFR calc non Af Amer: 87 mL/min/{1.73_m2} (ref >59)
Globulin, Total: 2.5 g/dL (ref 1.5–4.5)
Glucose: 88 mg/dL (ref 65–99)
Potassium: 3.8 mmol/L (ref 3.5–5.2)
Sodium: 141 mmol/L (ref 134–144)
Total Protein: 6.8 g/dL (ref 6.0–8.5)

## 2019-12-23 LAB — CBC WITH DIFFERENTIAL/PLATELET
Basophils Absolute: 0 10*3/uL (ref 0.0–0.2)
Basos: 1 %
EOS (ABSOLUTE): 0.1 10*3/uL (ref 0.0–0.4)
Eos: 3 %
Hematocrit: 37.5 % (ref 34.0–46.6)
Hemoglobin: 12.2 g/dL (ref 11.1–15.9)
Immature Grans (Abs): 0 10*3/uL (ref 0.0–0.1)
Immature Granulocytes: 0 %
Lymphocytes Absolute: 1.4 10*3/uL (ref 0.7–3.1)
Lymphs: 35 %
MCH: 26.6 pg (ref 26.6–33.0)
MCHC: 32.5 g/dL (ref 31.5–35.7)
MCV: 82 fL (ref 79–97)
Monocytes Absolute: 0.3 10*3/uL (ref 0.1–0.9)
Monocytes: 7 %
Neutrophils Absolute: 2.3 10*3/uL (ref 1.4–7.0)
Neutrophils: 54 %
Platelets: 238 10*3/uL (ref 150–450)
RBC: 4.58 x10E6/uL (ref 3.77–5.28)
RDW: 12.4 % (ref 11.7–15.4)
WBC: 4.1 10*3/uL (ref 3.4–10.8)

## 2019-12-23 LAB — HIV ANTIBODY (ROUTINE TESTING W REFLEX): HIV Screen 4th Generation wRfx: NONREACTIVE

## 2019-12-23 NOTE — Telephone Encounter (Signed)
-----   Message from [facility], DO sent at 12/23/2019 10:55 AM EDT ----- I have reviewed all lab results which are normal or stable. Please inform the patient.

## 2019-12-23 NOTE — Telephone Encounter (Signed)
Pt informed of negative labs. Nattie Lazenby Bruna Potter, CMA

## 2019-12-23 NOTE — Assessment & Plan Note (Addendum)
Doing well. No concerns at this time. CBC, CMP, EKG wnl. HIV NR. Heart murmur heard on exam not previously noted in this patient's history. Patient is asymptomatic. Etiology unknown at this time but could be due to her hypertensive state. Other things to consider if patient becomes symptomatic would be aortic stenosis or cardiomyopathy. Plans to have lipo suction surgery in April in [place]. Chales Abrahams MICA risk is 0.1%. -Consider echo if patient becomes symptomatic -Follow up in 1 year or sooner if needed

## 2019-12-23 NOTE — Telephone Encounter (Signed)
Patient calls nurse line with questions regarding her EKG. Patient stated when she reviewed on mychart it did not appear normal to her. Patient does have a hx of a heart murmur and is concerned. Patient stated she would like for her PCP to look over to see if she needs to be referred to cards or come back for a repeat.

## 2019-12-23 NOTE — Assessment & Plan Note (Addendum)
BP 138/88. Heart murmur heard on exam not previously noted in this patient's history. EKG NSR. Patient is asymptomatic. Etiology unknown at this time but could be due to her hypertensive state. Other things to consider if patient becomes symptomatic would be aortic stenosis or cardiomyopathy. -Continue HCTZ 12.5mg  -Consider echo if patient become symptomatic

## 2019-12-23 NOTE — Assessment & Plan Note (Addendum)
Hemoglobin 12.2 -Continue multivitamin -Consider starting iron 325mg  if hemoglobin drops with current supplemenation

## 2019-12-24 ENCOUNTER — Other Ambulatory Visit: Payer: Self-pay | Admitting: Student in an Organized Health Care Education/Training Program

## 2019-12-24 ENCOUNTER — Encounter: Payer: Self-pay | Admitting: Student in an Organized Health Care Education/Training Program

## 2019-12-24 DIAGNOSIS — R011 Cardiac murmur, unspecified: Secondary | ICD-10-CM

## 2019-12-24 NOTE — Progress Notes (Signed)
Patient requesting clearance for elective surgery. Provider 3/15 heard new murmur. I would have evaluated by cardiology prior to clearance. Put in referral to cardiology.

## 2019-12-25 NOTE — Telephone Encounter (Signed)
Please let patient know that I did refer her to cardiology

## 2019-12-25 NOTE — Telephone Encounter (Signed)
Attempted to call pt, mailbox is full. Will try again later. Christine Cervantes Bruna Potter, CMA

## 2019-12-26 ENCOUNTER — Telehealth: Payer: Self-pay

## 2019-12-26 DIAGNOSIS — Z01818 Encounter for other preprocedural examination: Secondary | ICD-10-CM | POA: Diagnosis not present

## 2019-12-26 NOTE — Telephone Encounter (Signed)
Called patient's listed phone number x 3 with no answer and voicemail is full.  Finally called patient;s alternative number and left message with patient's mother to call office.  When patient calls back, inform her that referral has been placed with cardiology.  Glennie Hawk, CMA

## 2019-12-30 ENCOUNTER — Ambulatory Visit (INDEPENDENT_AMBULATORY_CARE_PROVIDER_SITE_OTHER): Payer: 59 | Admitting: Cardiovascular Disease

## 2019-12-30 ENCOUNTER — Ambulatory Visit: Payer: No Typology Code available for payment source | Admitting: Podiatry

## 2019-12-30 ENCOUNTER — Encounter: Payer: Self-pay | Admitting: Cardiovascular Disease

## 2019-12-30 ENCOUNTER — Other Ambulatory Visit: Payer: Self-pay

## 2019-12-30 VITALS — BP 140/92 | HR 82 | Ht 64.0 in | Wt 209.0 lb

## 2019-12-30 DIAGNOSIS — R011 Cardiac murmur, unspecified: Secondary | ICD-10-CM | POA: Diagnosis not present

## 2019-12-30 NOTE — Assessment & Plan Note (Signed)
History of essential hypertension with blood pressure measured today 140/92.  She is on hydrochlorothiazide.

## 2019-12-30 NOTE — Progress Notes (Signed)
12/30/2019 Christine Cervantes   [birth year]  [id number]  Primary Physician Leeroy Bock, DO Primary Cardiologist: Runell Gess MD Nicholes Calamity, [place]  HPI:  Christine Cervantes is a 34 y.o. 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  She was referred by Karsten Fells, DO for cardiovascular valuation of preoperative clearance because of an auscultated murmur.  She does have a history of treated hypertension.  She otherwise otherwise has no cardiac risk factors.  She is never had heart attack or stroke.  There is no family history for heart disease.  She denies chest pain or shortness of breath.  She does do moderate activity and walks a trail without limitation or symptoms.  Cardiac murmur was auscultated.  Patient is planning to have cosmetic surgery she was referred for cardiac clearance.   Current Meds  Medication Sig  . hydrochlorothiazide (HYDRODIURIL) 12.5 MG tablet Take 1 tablet by mouth daily  . meloxicam (MOBIC) 15 MG tablet Take 1 tablet (15 mg total) by mouth daily.  . methylPREDNISolone (MEDROL DOSEPAK) 4 MG TBPK tablet 6 day dose pack - take as directed  . Multiple Vitamin (MULTIVITAMIN WITH MINERALS) TABS tablet Take 1 tablet by mouth daily.  . potassium chloride (MICRO-K) 10 MEQ CR capsule Take 10 mEq by mouth 2 (two) times daily.     Allergies  Allergen Reactions  . Latex Swelling and Rash    vaginally    Social History   Socioeconomic History  . Marital status: Single    Spouse name: Not on file  . Number of children: Not on file  . Years of education: Not on file  . Highest education level: Not on file  Occupational History  . Not on file  Tobacco Use  . Smoking status: Never Smoker  . Smokeless tobacco: Never Used  Substance and Sexual Activity  . Alcohol use: Yes    Alcohol/week: 0.0 standard drinks    Comment: occ before preg  . Drug use: No  .  Sexual activity: Yes    Birth control/protection: None    Comment: January 22 2015 last intercourse; ist depo in Jan 2016  Other Topics Concern  . Not on file  Social History Narrative  . Not on file   Social Determinants of Health   Financial Resource Strain:   . Difficulty of Paying Living Expenses:   Food Insecurity:   . Worried About Programme researcher, broadcasting/film/video in the Last Year:   . Barista in the Last Year:   Transportation Needs:   . Freight forwarder (Medical):   Marland Kitchen Lack of Transportation (Non-Medical):   Physical Activity:   . Days of Exercise per Week:   . Minutes of Exercise per Session:   Stress:   . Feeling of Stress :   Social Connections:   . Frequency of Communication with Friends and Family:   . Frequency of Social Gatherings with Friends and Family:   . Attends Religious Services:   . Active Member of Clubs or Organizations:   . Attends Banker Meetings:   Marland Kitchen Marital Status:   Intimate Partner Violence:   . Fear of Current or Ex-Partner:   . Emotionally Abused:   Marland Kitchen Physically Abused:   . Sexually Abused:      Review of Systems: General: negative for chills, fever, night sweats or weight changes.  Cardiovascular:  negative for chest pain, dyspnea on exertion, edema, orthopnea, palpitations, paroxysmal nocturnal dyspnea or shortness of breath Dermatological: negative for rash Respiratory: negative for cough or wheezing Urologic: negative for hematuria Abdominal: negative for nausea, vomiting, diarrhea, bright red blood per rectum, melena, or hematemesis Neurologic: negative for visual changes, syncope, or dizziness All other systems reviewed and are otherwise negative except as noted above.    Blood pressure (!) 140/92, pulse 82, height 5\' 4"  (1.626 m), weight 209 lb (94.8 kg), last menstrual period 12/19/2019, SpO2 96 %.  General appearance: alert and no distress Neck: no adenopathy, no carotid bruit, no JVD, supple, symmetrical, trachea  midline and thyroid not enlarged, symmetric, no tenderness/mass/nodules Lungs: clear to auscultation bilaterally Heart: Soft outflow tract murmur Extremities: extremities normal, atraumatic, no cyanosis or edema Pulses: 2+ and symmetric Skin: Skin color, texture, turgor normal. No rashes or lesions Neurologic: Alert and oriented X 3, normal strength and tone. Normal symmetric reflexes. Normal coordination and gait  EKG not performed today  ASSESSMENT AND PLAN:   Essential hypertension History of essential hypertension with blood pressure measured today 140/92.  She is on hydrochlorothiazide.  Cardiac murmur Ms. Brafford has a soft outflow tract murmur which I suspect is nonpathologic.  Suspected there is a benign flow murmur or related to aortic sclerosis.  And to get a 2D echo to further evaluate      Lorretta Harp MD [facility] 12/30/2019 3:15 PM

## 2019-12-30 NOTE — Patient Instructions (Signed)
Medication Instructions:  NO CHANGE *If you need a refill on your cardiac medications before your next appointment, please call your pharmacy*   Lab Work: If you have labs (blood work) drawn today and your tests are completely normal, you will receive your results only by: . MyChart Message (if you have MyChart) OR . A paper copy in the mail If you have any lab test that is abnormal or we need to change your treatment, we will call you to review the results.   Testing/Procedures: Your physician has requested that you have an echocardiogram. Echocardiography is a painless test that uses sound waves to create images of your heart. It provides your doctor with information about the size and shape of your heart and how well your heart's chambers and valves are working. This procedure takes approximately one hour. There are no restrictions for this procedure.[address]   Follow-Up: At [facility], you and your health needs are our priority.  As part of our continuing mission to provide you with exceptional heart care, we have created designated Provider Care Teams.  These Care Teams include your primary Cardiologist (physician) and Advanced Practice Providers (APPs -  Physician Assistants and Nurse Practitioners) who all work together to provide you with the care you need, when you need it.  We recommend signing up for the patient portal called "MyChart".  Sign up information is provided on this After Visit Summary.  MyChart is used to connect with patients for Virtual Visits (Telemedicine).  Patients are able to view lab/test results, encounter notes, upcoming appointments, etc.  Non-urgent messages can be sent to your provider as well.   To learn more about what you can do with MyChart, go to [email or web address].    Your next appointment:    AS NEEDED 

## 2019-12-30 NOTE — Assessment & Plan Note (Signed)
Christine Cervantes has a soft outflow tract murmur which I suspect is nonpathologic.  Suspected there is a benign flow murmur or related to aortic sclerosis.  And to get a 2D echo to further evaluate

## 2019-12-31 ENCOUNTER — Ambulatory Visit (HOSPITAL_COMMUNITY)
Admission: RE | Admit: 2019-12-31 | Discharge: 2019-12-31 | Disposition: A | Discharge status: Home / Self Care | Payer: 59 | Source: Ambulatory Visit | Attending: Cardiovascular Disease | Admitting: Cardiovascular Disease

## 2019-12-31 DIAGNOSIS — R011 Cardiac murmur, unspecified: Secondary | ICD-10-CM | POA: Insufficient documentation

## 2019-12-31 DIAGNOSIS — I081 Rheumatic disorders of both mitral and tricuspid valves: Secondary | ICD-10-CM | POA: Diagnosis not present

## 2019-12-31 DIAGNOSIS — I119 Hypertensive heart disease without heart failure: Secondary | ICD-10-CM | POA: Diagnosis not present

## 2019-12-31 NOTE — Progress Notes (Signed)
  Echocardiogram 2D Echocardiogram has been performed.  Christine Cervantes 12/31/2019, 8:54 AM

## 2020-01-01 NOTE — Progress Notes (Signed)
Subjective:   Chief Complaint  Patient presents with  . Medical Clearance for Surgery    Christine Cervantes  is here for a Pre-operative physical at the request of Dr. Frutoso Schatz [facility]).   She  is having fat transfer to buttocks (Brazilian butt lift), liposuction-full body 360 (12 areas) surgery on 01/16/20 for cosmetic surgery.  3/15 with Dr. Salvadore Dom for surgical clearance.  At that time Dr. Salvadore Dom heard murmur so patient was then referred to Dr. Allyson Sabal for cardiology clearance.  Per Dr. Allyson Sabal this is likely a soft outflow tract murmur which he suspected was nonpathologic.  Did obtain a 2D echocardiogram showed normal LV size and function with mild concentric LVH.  Dr. Allyson Sabal told patient that since echocardiogram was normal that she was cleared for surgery.  Personal or family hx of adverse outcome to anesthesia? No  Chipped, cracked, missing, or loose teeth? Yes ; small crack on the bottom of her right front tooth Decreased ROM of neck? No  Able to walk up 2 flights of stairs without becoming significantly short of breath or having chest pain? Yes   Revised Goldman Criteria: High Risk Surgery (intraperitoneal, intrathoracic, aortic): No  Ischemic heart disease (Prior MI, +excercise stress test, angina, nitrate use, Qwave): No  History of heart failure: No  History of cerebrovascular disease: No  History of diabetes: No  Insulin therapy for DM: No  Preoperative Cr >2.0: 0.87  Revised Goldman Criteria - risk for major cardiac death No risk factors -- 0.4 percent  Patient Active Problem List   Diagnosis Date Noted  . Pre-operative clearance 01/04/2020  . Cardiac murmur 12/30/2019  . Encounter for annual physical exam 12/22/2019  . Herpes simplex type 2 infection 08/05/2019  . Cervical cancer screening 08/04/2019  . Ankle pain, chronic 08/04/2019  . Environmental and seasonal allergies 01/23/2019  . Back pain 12/10/2018  . Screen for STD (sexually transmitted disease) 11/21/2018  . CIN II  (cervical intraepithelial neoplasia II) 09/06/2017  . Contraceptive management 05/03/2017  . Anemia, iron deficiency 02/25/2013  . Essential hypertension 02/17/2013  . Depression 07/05/2009  . OBESITY 03/04/2008   Past Medical History:  Diagnosis Date  . Anemia   . Depression   . Headache(784.0)   . Pregnancy induced hypertension    no meds  . Vaginal Pap smear, abnormal     Past Surgical History:  Procedure Laterality Date  . BREAST REDUCTION SURGERY Bilateral 03/12/2019   Procedure: BILATERAL MAMMARY REDUCTION  (BREAST);  Surgeon: Peggye Form, DO;  Location: [facility];  Service: Plastics;  Laterality: Bilateral;  3.5 hours, please  . FOOT SURGERY  2004-2004   corns removed from both feet, hammertoes repaired  . GYNECOLOGIC CRYOSURGERY    . HAMMER TOE SURGERY      Current Outpatient Medications  Medication Sig Dispense Refill  . hydrochlorothiazide (HYDRODIURIL) 12.5 MG tablet Take 1 tablet by mouth daily 90 tablet 0  . meloxicam (MOBIC) 15 MG tablet Take 1 tablet (15 mg total) by mouth daily. 30 tablet 3  . methylPREDNISolone (MEDROL DOSEPAK) 4 MG TBPK tablet 6 day dose pack - take as directed 21 tablet 0  . Multiple Vitamin (MULTIVITAMIN WITH MINERALS) TABS tablet Take 1 tablet by mouth daily.    . potassium chloride (MICRO-K) 10 MEQ CR capsule Take 10 mEq by mouth 2 (two) times daily.     No current facility-administered medications for this visit.    Allergies  Allergen Reactions  . Latex Swelling and Rash  vaginally    Social History   Socioeconomic History  . Marital status: Single    Spouse name: Not on file  . Number of children: Not on file  . Years of education: Not on file  . Highest education level: Not on file  Occupational History  . Not on file  Tobacco Use  . Smoking status: Never Smoker  . Smokeless tobacco: Never Used  Substance and Sexual Activity  . Alcohol use: Yes    Alcohol/week: 0.0 standard drinks    Comment:  occ before preg  . Drug use: No  . Sexual activity: Yes    Birth control/protection: None    Comment: January 22 2015 last intercourse; ist depo in Jan 2016  Other Topics Concern  . Not on file  Social History Narrative  . Not on file   Social Determinants of Health   Financial Resource Strain:   . Difficulty of Paying Living Expenses:   Food Insecurity:   . Worried About Programme researcher, broadcasting/film/video in the Last Year:   . Barista in the Last Year:   Transportation Needs:   . Freight forwarder (Medical):   Marland Kitchen Lack of Transportation (Non-Medical):   Physical Activity:   . Days of Exercise per Week:   . Minutes of Exercise per Session:   Stress:   . Feeling of Stress :   Social Connections:   . Frequency of Communication with Friends and Family:   . Frequency of Social Gatherings with Friends and Family:   . Attends Religious Services:   . Active Member of Clubs or Organizations:   . Attends Banker Meetings:   Marland Kitchen Marital Status:   Intimate Partner Violence:   . Fear of Current or Ex-Partner:   . Emotionally Abused:   Marland Kitchen Physically Abused:   . Sexually Abused:     Family History  Problem Relation Age of Onset  . Hypertension Mother   . Hypertension Maternal Aunt   . Hypertension Maternal Grandmother   . Diabetes Paternal Grandmother   . Anesthesia problems Neg Hx   . Other Neg Hx      Review of Systems:   Constitutional:  no unexpected change in weight, no weakness, no unexplained fevers, sweats, or chills Eye:  no recent significant change in vision Ear:  no hearing loss Nose/Mouth/Throat:  No dental complaints Neck/Thyroid:  no lumps or masses Pulmonary:  no chronic cough, sputum, or hemoptysis and no shortness of breath Cardiovascular:  no exercise intolerance, no chest pain Gastrointestinal:  no abdominal pain and no change in bowel habits GU:  negative for dysuria, frequency, and incontinence Musculoskeletal/Extremities:  no peripheral  edema Skin/Integumentary ROS:  no abnormal skin lesions reported Neurologic:  no numbness, tingling, or tremor   Objective:   Vitals:   01/02/20 0920 01/02/20 0958  BP: 110/78   Pulse: 75   Resp:  12  SpO2: 98%   Weight: 201 lb 12.8 oz (91.5 kg)   Height: 5\' 4"  (1.626 m)    Body mass index is 34.64 kg/m.  General:  well developed, well nourished, in no apparent distress Skin:  warm, no pallor or diaphoresis Head:  normocephalic, atraumatic Eyes:  pupils equal and round, sclera anicteric without injection Ears:  canals without lesions, TMs shiny without retraction, no obvious effusion, no erythema Throat/Pharynx:  lips and gingiva without lesion; tongue and uvula midline; non-inflamed pharynx; no exudates or postnasal drainage Neck: neck supple without adenopathy, or masses Teeth:  small crack at lower lateral border of front tooth  Lungs:  clear to auscultation, breath sounds equal bilaterally, no respiratory distress Cardio:  regular rate and rhythm, grade 1/6 systolic murmur Abdomen:  abdomen soft, nontender; bowel sounds normal; no masses, hepatomegaly or splenomegaly Musculoskeletal:  symmetrical muscle groups noted without atrophy or deformity Extremities:  no clubbing, cyanosis, or edema, no deformities, no skin discoloration Neuro:  gait normal; deep tendon reflexes normal and symmetric and alert and oriented to person, place, and time Psych: Age appropriate judgment and insight; normal mood   Assessment:   Surgery, elective - Plan: Protime-INR, APTT  Pre-operative clearance   Plan:   Orders as above. The above laboratory work was ordered and will be sent with this physical. Pending the above workup, the patient is deemed low risk for the proposed procedure.  The patient voiced understanding and agreement to the plan.  Caroline More, DO 01/04/20  1:44 AM

## 2020-01-02 ENCOUNTER — Telehealth: Payer: Self-pay | Admitting: Cardiovascular Disease

## 2020-01-02 ENCOUNTER — Encounter: Payer: Self-pay | Admitting: Family Medicine

## 2020-01-02 ENCOUNTER — Other Ambulatory Visit: Payer: Self-pay

## 2020-01-02 ENCOUNTER — Ambulatory Visit: Payer: 59 | Admitting: Family Medicine

## 2020-01-02 VITALS — BP 110/78 | HR 75 | Resp 12 | Ht 64.0 in | Wt 201.8 lb

## 2020-01-02 DIAGNOSIS — Z01818 Encounter for other preprocedural examination: Secondary | ICD-10-CM

## 2020-01-02 DIAGNOSIS — Z419 Encounter for procedure for purposes other than remedying health state, unspecified: Secondary | ICD-10-CM | POA: Diagnosis not present

## 2020-01-02 DIAGNOSIS — Z20828 Contact with and (suspected) exposure to other viral communicable diseases: Secondary | ICD-10-CM | POA: Diagnosis not present

## 2020-01-02 NOTE — Telephone Encounter (Signed)
Patient called Christine Cervantes in error.

## 2020-01-02 NOTE — Patient Instructions (Signed)
Today we discussed your surgical clearance. We will fax over a copy to your surgeon. You will be getting blood work today.   If you have any questions please call or send a mychart message.

## 2020-01-03 LAB — PROTIME-INR
INR: 1 (ref 0.9–1.2)
Prothrombin Time: 10.7 s (ref 9.1–12.0)

## 2020-01-03 LAB — APTT: aPTT: 29 s (ref 24–33)

## 2020-01-04 DIAGNOSIS — Z01818 Encounter for other preprocedural examination: Secondary | ICD-10-CM | POA: Insufficient documentation

## 2020-01-04 NOTE — Assessment & Plan Note (Signed)
Patient presenting for surgical clearance prior to having fat transfer to buttocks as well as full body liposuction.  Had visit on 3/15 with Dr. Salvadore Dom and was subsequently followed by Dr. Allyson Sabal, cardiology.  Given that patient has normal echocardiogram she is considered low risk for surgery.  I have completed paperwork and obtained remaining lab work for patient.  Previous work-up has already been obtained by Dr. Salvadore Dom.  Strict return precautions given.  Follow-up with PCP postop.

## 2020-01-23 ENCOUNTER — Emergency Department (HOSPITAL_BASED_OUTPATIENT_CLINIC_OR_DEPARTMENT_OTHER)
Admit: 2020-01-23 | Discharge: 2020-01-23 | Disposition: A | Discharge status: Home / Self Care | Payer: 59 | Attending: Emergency Medicine | Admitting: Emergency Medicine

## 2020-01-23 ENCOUNTER — Emergency Department (HOSPITAL_COMMUNITY): Payer: 59

## 2020-01-23 ENCOUNTER — Encounter (HOSPITAL_COMMUNITY): Payer: Self-pay | Admitting: Emergency Medicine

## 2020-01-23 ENCOUNTER — Other Ambulatory Visit: Payer: Self-pay

## 2020-01-23 ENCOUNTER — Emergency Department (HOSPITAL_COMMUNITY)
Admission: EM | Admit: 2020-01-23 | Discharge: 2020-01-23 | Disposition: A | Discharge status: Home / Self Care | Payer: 59 | Attending: Emergency Medicine | Admitting: Emergency Medicine

## 2020-01-23 DIAGNOSIS — D649 Anemia, unspecified: Secondary | ICD-10-CM | POA: Insufficient documentation

## 2020-01-23 DIAGNOSIS — I10 Essential (primary) hypertension: Secondary | ICD-10-CM | POA: Insufficient documentation

## 2020-01-23 DIAGNOSIS — R531 Weakness: Secondary | ICD-10-CM | POA: Diagnosis not present

## 2020-01-23 DIAGNOSIS — Z79899 Other long term (current) drug therapy: Secondary | ICD-10-CM | POA: Insufficient documentation

## 2020-01-23 DIAGNOSIS — R1907 Generalized intra-abdominal and pelvic swelling, mass and lump: Secondary | ICD-10-CM | POA: Diagnosis not present

## 2020-01-23 DIAGNOSIS — R2242 Localized swelling, mass and lump, left lower limb: Secondary | ICD-10-CM | POA: Insufficient documentation

## 2020-01-23 DIAGNOSIS — R609 Edema, unspecified: Secondary | ICD-10-CM

## 2020-01-23 DIAGNOSIS — N200 Calculus of kidney: Secondary | ICD-10-CM | POA: Diagnosis not present

## 2020-01-23 LAB — CBC WITH DIFFERENTIAL/PLATELET
Abs Immature Granulocytes: 0.03 10*3/uL (ref 0.00–0.07)
Basophils Absolute: 0 10*3/uL (ref 0.0–0.1)
Basophils Relative: 0 %
Eosinophils Absolute: 0.1 10*3/uL (ref 0.0–0.5)
Eosinophils Relative: 2 %
HCT: 29.1 % — ABNORMAL LOW (ref 36.0–46.0)
Hemoglobin: 8.8 g/dL — ABNORMAL LOW (ref 12.0–15.0)
Immature Granulocytes: 0 %
Lymphocytes Relative: 17 %
Lymphs Abs: 1.4 10*3/uL (ref 0.7–4.0)
MCH: 26.6 pg (ref 26.0–34.0)
MCHC: 30.2 g/dL (ref 30.0–36.0)
MCV: 87.9 fL (ref 80.0–100.0)
Monocytes Absolute: 0.7 10*3/uL (ref 0.1–1.0)
Monocytes Relative: 9 %
Neutro Abs: 5.9 10*3/uL (ref 1.7–7.7)
Neutrophils Relative %: 72 %
Platelets: 258 10*3/uL (ref 150–400)
RBC: 3.31 MIL/uL — ABNORMAL LOW (ref 3.87–5.11)
RDW: 13.3 % (ref 11.5–15.5)
WBC: 8.2 10*3/uL (ref 4.0–10.5)
nRBC: 0 % (ref 0.0–0.2)

## 2020-01-23 LAB — COMPREHENSIVE METABOLIC PANEL
ALT: 50 U/L — ABNORMAL HIGH (ref 0–44)
AST: 40 U/L (ref 15–41)
Albumin: 3.4 g/dL — ABNORMAL LOW (ref 3.5–5.0)
Alkaline Phosphatase: 44 U/L (ref 38–126)
Anion gap: 8 (ref 5–15)
BUN: 8 mg/dL (ref 6–20)
CO2: 30 mmol/L (ref 22–32)
Calcium: 8.6 mg/dL — ABNORMAL LOW (ref 8.9–10.3)
Chloride: 99 mmol/L (ref 98–111)
Creatinine, Ser: 0.73 mg/dL (ref 0.44–1.00)
GFR calc Af Amer: >60 mL/min (ref >60)
GFR calc non Af Amer: >60 mL/min (ref >60)
Glucose, Bld: 114 mg/dL — ABNORMAL HIGH (ref 70–99)
Potassium: 3.8 mmol/L (ref 3.5–5.1)
Sodium: 137 mmol/L (ref 135–145)
Total Bilirubin: 1 mg/dL (ref 0.3–1.2)
Total Protein: 7.1 g/dL (ref 6.5–8.1)

## 2020-01-23 LAB — I-STAT BETA HCG BLOOD, ED (MC, WL, AP ONLY): I-stat hCG, quantitative: <5 m[IU]/mL (ref <5)

## 2020-01-23 MED ORDER — SODIUM CHLORIDE 0.9 % IV BOLUS
1000.0000 mL | Freq: Once | INTRAVENOUS | Status: AC
  Administered 2020-01-23: 1000 mL via INTRAVENOUS

## 2020-01-23 MED ORDER — DIPHENHYDRAMINE HCL 50 MG/ML IJ SOLN
25.0000 mg | Freq: Once | INTRAMUSCULAR | Status: AC
  Administered 2020-01-23: 25 mg via INTRAVENOUS
  Filled 2020-01-23: qty 1

## 2020-01-23 MED ORDER — IOHEXOL 300 MG/ML  SOLN
100.0000 mL | Freq: Once | INTRAMUSCULAR | Status: AC | PRN
  Administered 2020-01-23: 100 mL via INTRAVENOUS

## 2020-01-23 MED ORDER — FERROUS SULFATE 325 (65 FE) MG PO TABS: 325.0000 mg | ORAL_TABLET | Freq: Every day | ORAL | 0 refills | Status: DC

## 2020-01-23 MED ORDER — METOCLOPRAMIDE HCL 5 MG/ML IJ SOLN
10.0000 mg | Freq: Once | INTRAMUSCULAR | Status: AC
  Administered 2020-01-23: 10 mg via INTRAVENOUS
  Filled 2020-01-23: qty 2

## 2020-01-23 MED ORDER — SODIUM CHLORIDE (PF) 0.9 % IJ SOLN
INTRAMUSCULAR | Status: AC
  Filled 2020-01-23: qty 50

## 2020-01-23 NOTE — Progress Notes (Signed)
Left lower extremity venous duplex has been completed. Preliminary results can be found in CV Proc through chart review.  Results were given to Dr. Lockie Mola.  01/23/20 9:37 AM Olen Cordial RVT

## 2020-01-23 NOTE — Discharge Instructions (Addendum)
Take the iron pills as directed.  These can cause constipation so you can take MiraLAX as needed if you start to develop constipation. Return to the ED if you start to experience worsening pain, fever, chest pain, leg swelling. Your CT scan today shows that you have a nonspecific mass in your left breast.  You will need to inform your primary care provider about this.

## 2020-01-23 NOTE — ED Triage Notes (Signed)
Patient went to [facility] to get liposuction on Monday, came home on Wednesday. Reports weakness since the surgery.

## 2020-01-23 NOTE — ED Provider Notes (Signed)
[facility] DEPT Provider Note   CSN: [id number] Arrival date & time: 01/23/20  0749     History Chief Complaint  Patient presents with   Weakness    Christine Cervantes is a 34 y.o. female who is status post liposuction and "[place] butt lift" in [place] on 01/19/2020 presenting to the ED with generalized weakness, abdominal swelling, left lower extremity swelling, paresthesias of extremities.  States that she was receiving "lymphatic massages" while she was in [place].  She was told this was important to prevent worsening swelling.  However her last massage was on 4/14, because after that she had to fly back to [place] to take care of her children.  She believes the lower abdominal swelling has gotten worse.  She also reports generalized weakness, fatigue, lightheadedness and tingling in bilateral upper extremities.  She also reports decreased appetite.  She denies any vomiting and has had normal bowel movement since the surgery.  She was concerned because her son told her that her left lower extremity was swelling as well.  She denies any chest pain, shortness of breath, fever, injuries or falls, dysuria.  HPI     Past Medical History:  Diagnosis Date   Anemia    Depression    Headache(784.0)    Pregnancy induced hypertension    no meds   Vaginal Pap smear, abnormal     Patient Active Problem List   Diagnosis Date Noted   Pre-operative clearance 01/04/2020   Cardiac murmur 12/30/2019   Encounter for annual physical exam 12/22/2019   Herpes simplex type 2 infection 08/05/2019   Cervical cancer screening 08/04/2019   Ankle pain, chronic 08/04/2019   Environmental and seasonal allergies 01/23/2019   Back pain 12/10/2018   Screen for STD (sexually transmitted disease) 11/21/2018   CIN II (cervical intraepithelial neoplasia II) 09/06/2017   Contraceptive management 05/03/2017   Anemia, iron deficiency 02/25/2013    Essential hypertension 02/17/2013   Depression 07/05/2009   OBESITY 03/04/2008    Past Surgical History:  Procedure Laterality Date   BREAST REDUCTION SURGERY Bilateral 03/12/2019   Procedure: BILATERAL MAMMARY REDUCTION  (BREAST);  Surgeon: Wallace Going, DO;  Location: [facility];  Service: Plastics;  Laterality: Bilateral;  3.5 hours, please   FOOT SURGERY  2004-2004   corns removed from both feet, hammertoes repaired   GYNECOLOGIC CRYOSURGERY     HAMMER TOE SURGERY       OB History    Gravida  6   Para  4   Term  2   Preterm  2   AB  2   Living  4     SAB  2   TAB      Ectopic      Multiple  0   Live Births  4           Family History  Problem Relation Age of Onset   Hypertension Mother    Hypertension Maternal Aunt    Hypertension Maternal Grandmother    Diabetes Paternal Grandmother    Anesthesia problems Neg Hx    Other Neg Hx     Social History   Tobacco Use   Smoking status: Never Smoker   Smokeless tobacco: Never Used  Substance Use Topics   Alcohol use: Yes    Alcohol/week: 0.0 standard drinks    Comment: occ before preg   Drug use: No    Home Medications Prior to Admission medications   Medication  Sig Start Date End Date Taking? Authorizing Provider  acetaminophen (TYLENOL) 325 MG tablet Take 650 mg by mouth every 6 (six) hours as needed for mild pain or headache.   Yes [provider]  Ascorbic Acid (VITAMIN C PO) Take 1 tablet by mouth daily.   Yes [provider]  cephALEXin (KEFLEX) 500 MG capsule Take 500 mg by mouth 2 (two) times daily. 7 day supply 01/19/20  Yes [provider]  hydrochlorothiazide (HYDRODIURIL) 12.5 MG tablet Take 1 tablet by mouth daily 10/07/19  Yes Anderson, Chelsey L, DO  Menthol-Methyl Salicylate (MUSCLE RUB) 10-15 % CREA Apply 1 application topically as needed for muscle pain.   Yes [provider]  ondansetron (ZOFRAN-ODT) 8 MG  disintegrating tablet Take 8 mg by mouth every 6 (six) hours as needed for nausea/vomiting. 01/19/20  Yes [provider]  VITAMIN E PO Take 1 tablet by mouth daily.   Yes [provider]  Ferrous Sulfate (IRON PO) Take by mouth daily. Liquid OTC iron not sure of dose  01/23/20 Yes [provider]  ferrous sulfate 325 (65 FE) MG tablet Take 1 tablet (325 mg total) by mouth daily. 01/23/20   Collins Dimaria, PA-C  meloxicam (MOBIC) 15 MG tablet Take 1 tablet (15 mg total) by mouth daily. Patient not taking: Reported on 01/23/2020 11/25/19   Hyatt, Max T, DPM  methylPREDNISolone (MEDROL DOSEPAK) 4 MG TBPK tablet 6 day dose pack - take as directed Patient not taking: Reported on 01/23/2020 11/25/19   Elinor Parkinson, DPM    Allergies    Latex  Review of Systems   Review of Systems  Constitutional: Positive for fatigue. Negative for appetite change, chills and fever.  HENT: Negative for ear pain, rhinorrhea, sneezing and sore throat.   Eyes: Negative for photophobia and visual disturbance.  Respiratory: Negative for cough, chest tightness, shortness of breath and wheezing.   Cardiovascular: Negative for chest pain and palpitations.  Gastrointestinal: Positive for abdominal pain and nausea. Negative for blood in stool, constipation, diarrhea and vomiting.  Genitourinary: Negative for dysuria, hematuria and urgency.  Musculoskeletal: Positive for myalgias.  Skin: Negative for rash.  Neurological: Negative for dizziness, weakness and light-headedness.    Physical Exam Updated Vital Signs BP 115/78    Pulse 95    Temp 98.6 F (37 C) (Oral)    Resp 19    Ht 5\' 4"  (1.626 m)    Wt 91.5 kg    SpO2 98%    BMI 34.64 kg/m   Physical Exam Vitals and nursing note reviewed.  Constitutional:      General: She is not in acute distress.    Appearance: She is well-developed.     Comments: Ambulatory without difficulty.  HENT:     Head: Normocephalic and atraumatic.     Nose: Nose  normal.  Eyes:     General: No scleral icterus.       Right eye: No discharge.        Left eye: No discharge.     Conjunctiva/sclera: Conjunctivae normal.  Cardiovascular:     Rate and Rhythm: Normal rate and regular rhythm.     Heart sounds: Normal heart sounds. No murmur. No friction rub. No gallop.   Pulmonary:     Effort: Pulmonary effort is normal. No respiratory distress.     Breath sounds: Normal breath sounds.  Abdominal:     General: Bowel sounds are normal. There is no distension.     Palpations:  Abdomen is soft.     Tenderness: There is abdominal tenderness (Lower abdomen). There is no guarding or rebound.  Musculoskeletal:        General: Normal range of motion.     Cervical back: Normal range of motion and neck supple.  Skin:    General: Skin is warm and dry.     Findings: No rash.     Comments: No wounds or erythema noted on abdomen.  Neurological:     General: No focal deficit present.     Mental Status: She is alert and oriented to person, place, and time.     Cranial Nerves: No cranial nerve deficit.     Sensory: No sensory deficit.     Motor: No weakness or abnormal muscle tone.     Coordination: Coordination normal.     ED Results / Procedures / Treatments   Labs (all labs ordered are listed, but only abnormal results are displayed) Labs Reviewed  COMPREHENSIVE METABOLIC PANEL - Abnormal; Notable for the following components:      Result Value   Glucose, Bld 114 (*)    Calcium 8.6 (*)    Albumin 3.4 (*)    ALT 50 (*)    All other components within normal limits  CBC WITH DIFFERENTIAL/PLATELET - Abnormal; Notable for the following components:   RBC 3.31 (*)    Hemoglobin 8.8 (*)    HCT 29.1 (*)    All other components within normal limits  I-STAT BETA HCG BLOOD, ED ([facility], [facility], [facility] ONLY)    EKG EKG Interpretation  Date/Time:  Friday January 23 2020 09:16:35 EDT Ventricular Rate:  81 PR Interval:    QRS Duration: 89 QT Interval:  362 QTC  Calculation: 421 R Axis:   83 Text Interpretation: Sinus rhythm Baseline wander in lead(s) V6 No STEMI Confirmed by Alona Bene [phone]) on 01/23/2020 9:21:31 AM   Radiology DG Chest 2 View  Result Date: 01/23/2020 CLINICAL DATA:  Weakness, status post liposuction this week. Back pain. EXAM: CHEST - 2 VIEW COMPARISON:  03/19/2017. FINDINGS: Trachea is midline. Heart size stable. Lungs are clear. No pleural fluid. IMPRESSION: No acute findings. Electronically Signed   By: Leanna Battles M.D.   On: 01/23/2020 09:22   CT ABDOMEN PELVIS W CONTRAST  Result Date: 01/23/2020 CLINICAL DATA:  34 year old female with history of postoperative swelling and pain following liposuction procedure on 01/19/2020. No fever. EXAM: CT ABDOMEN AND PELVIS WITH CONTRAST TECHNIQUE: Multidetector CT imaging of the abdomen and pelvis was performed using the standard protocol following bolus administration of intravenous contrast. CONTRAST:  OMNIPAQUE IOHEXOL 300 MG/ML  SOLN COMPARISON:  No priors. FINDINGS: Lower chest: Small amount of subcutaneous fluid and gas in the subcutaneous fat of the lower back bilaterally. In the inferior aspect of the left breast (axial image 2 of series 2) there is a 3.0 x 2.4 cm lesion with some peripheral rim enhancement. Hepatobiliary: No suspicious cystic or solid hepatic lesions. No intra or extrahepatic biliary ductal dilatation. Gallbladder is normal in appearance. Pancreas: No pancreatic mass. No pancreatic ductal dilatation. No pancreatic or peripancreatic fluid collections or inflammatory changes. Spleen: Unremarkable. Adrenals/Urinary Tract: 3 mm nonobstructive calculus in the interpolar collecting system of the right kidney. Bilateral kidneys and adrenal glands are otherwise normal in appearance. No hydroureteronephrosis. Urinary bladder is nearly completely decompressed, but otherwise unremarkable in appearance. Stomach/Bowel: Stomach is normal in appearance. No pathologic dilatation  of small bowel or colon. Normal appendix. Vascular/Lymphatic: No significant atherosclerotic  disease, aneurysm or dissection noted in the abdominal or pelvic vasculature. No lymphadenopathy noted in the abdomen or pelvis. Reproductive: Uterus and ovaries are unremarkable in appearance. Other: No significant volume of ascites.  No pneumoperitoneum. Musculoskeletal: Extensive soft tissue stranding and small volume of fluid in the subcutaneous fat of the anterior abdominal wall and flanks bilaterally, as well as the upper buttock region bilaterally. No well-defined rim enhancing fluid collection identified to suggest postprocedural abscess at this time. There are no aggressive appearing lytic or blastic lesions noted in the visualized portions of the skeleton. IMPRESSION: 1. Expected postoperative changes of recent liposuction procedure with soft tissue stranding, gas and small amounts of fluid in the subcutaneous fat of the back, anterior abdominal wall and bilateral flanks. No well-defined fluid collection to suggest abscess at this time. 2. Indeterminate incompletely imaged lesion in the inferior aspect of the left breast, as detailed above. Further evaluation with nonemergent mammography or breast ultrasound should be considered in the near future to exclude the possibility of neoplasm. 3. 3 mm nonobstructive calculus in the interpolar collecting system of the right kidney. Electronically Signed   By: Trudie Reed M.D.   On: 01/23/2020 11:44   VAS [place] LOWER EXTREMITY VENOUS (DVT) (ONLY [facility] & [facility] 7a-7p)  Result Date: 01/23/2020  Lower Venous DVTStudy Indications: Edema.  Risk Factors: Surgery. Limitations: Body habitus and poor ultrasound/tissue interface. Comparison Study: No prior studies. Performing Technologist: Chanda Busing RVT  Examination Guidelines: A complete evaluation includes B-mode imaging, spectral Doppler, color Doppler, and power Doppler as needed of all accessible portions of each vessel.  Bilateral testing is considered an integral part of a complete examination. Limited examinations for reoccurring indications may be performed as noted. The reflux portion of the exam is performed with the patient in reverse Trendelenburg.  +-----+---------------+---------+-----------+----------+--------------+  RIGHT Compressibility Phasicity Spontaneity Properties Thrombus Aging  +-----+---------------+---------+-----------+----------+--------------+  CFV   Full            Yes       Yes                                    +-----+---------------+---------+-----------+----------+--------------+   +---------+---------------+---------+-----------+----------+--------------+  LEFT      Compressibility Phasicity Spontaneity Properties Thrombus Aging  +---------+---------------+---------+-----------+----------+--------------+  CFV       Full            Yes       Yes                                    +---------+---------------+---------+-----------+----------+--------------+  SFJ       Full                                                             +---------+---------------+---------+-----------+----------+--------------+  FV Prox   Full                                                             +---------+---------------+---------+-----------+----------+--------------+  FV Mid    Full                                                             +---------+---------------+---------+-----------+----------+--------------+  FV Distal Full                                                             +---------+---------------+---------+-----------+----------+--------------+  PFV       Full                                                             +---------+---------------+---------+-----------+----------+--------------+  POP       Full            Yes       Yes                                    +---------+---------------+---------+-----------+----------+--------------+  PTV       Full                                                              +---------+---------------+---------+-----------+----------+--------------+  PERO      Full                                                             +---------+---------------+---------+-----------+----------+--------------+     Summary: RIGHT: - No evidence of common femoral vein obstruction.  LEFT: - There is no evidence of deep vein thrombosis in the lower extremity.  - No cystic structure found in the popliteal fossa.  *See table(s) above for measurements and observations.    Preliminary     Procedures Procedures (including critical care time)  Medications Ordered in ED Medications  sodium chloride (PF) 0.9 % injection (has no administration in time range)  sodium chloride 0.9 % bolus 1,000 mL (0 mLs Intravenous Stopped 01/23/20 1137)  metoCLOPramide (REGLAN) injection 10 mg (10 mg Intravenous Given 01/23/20 0910)  diphenhydrAMINE (BENADRYL) injection 25 mg (25 mg Intravenous Given 01/23/20 0910)  iohexol (OMNIPAQUE) 300 MG/ML solution 100 mL (100 mLs Intravenous Contrast Given 01/23/20 1052)    ED Course  I have reviewed the triage vital signs and the nursing notes.  Pertinent labs & imaging results that were available during my care of the patient were reviewed by me and considered in my medical decision making (see chart for details).    MDM Rules/Calculators/A&P  34 year old female who is status post liposuction" [place] butt lift" in [place] on 01/19/2020 presenting to the ED with generalized weakness, abdominal swelling, left lower extremity swelling, paresthesias.  She was told to receive "lymphatic massages" on her abdomen which she last had 2 days ago.  Unfortunately she had to fly back to [place] after her surgery and could not receive any more of these.  She reports lower abdominal swelling, generalized weakness and fatigue.  On exam patient has tenderness palpation of the lower abdomen without overlying skin changes.  No  distention noted.  She is concerned about swelling in her left lower extremity which I do not appreciate on my exam today.  She is not tachycardic, tachypneic or hypoxic.  She denies chest pain.  Work-up here shows hemoglobin of 8.8 which could certainly explain her symptoms of generalized weakness and fatigue.  Feel that this is due to her recent surgery as she is not actively bleeding at this time.  EKG without any ischemic changes.  CMP is unremarkable.  Chest x-ray without any acute findings.  CT abdomen pelvis with expected postop findings without any concerns for abscess.  There is an incidental left breast mass noted which I informed patient of.  Patient was given IV fluids here with significant improvement in her symptoms.  We will have her start iron supplements to help with her anemia.  Encouraged follow-up with surgery as well as PCP.  Have her return for worsening symptoms.  All imaging, if done today, including plain films, CT scans, and ultrasounds, independently reviewed by me, and interpretations confirmed via formal radiology reads.  Patient is hemodynamically stable, in NAD, and able to ambulate in the ED. Evaluation does not show pathology that would require ongoing emergent intervention or inpatient treatment. I explained the diagnosis to the patient. Pain has been managed and has no complaints prior to discharge. Patient is comfortable with above plan and is stable for discharge at this time. All questions were answered prior to disposition. Strict return precautions for returning to the ED were discussed. Encouraged follow up with PCP.   An After Visit Summary was printed and given to the patient.   Portions of this note were generated with Scientist, clinical (histocompatibility and immunogenetics). Dictation errors may occur despite best attempts at proofreading.  Final Clinical Impression(s) / ED Diagnoses Final diagnoses:  Anemia, unspecified type    Rx / DC Orders ED Discharge Orders         Ordered     ferrous sulfate 325 (65 FE) MG tablet  Daily     01/23/20 1323           Dietrich Pates, PA-C 01/23/20 1327    Long, Arlyss Repress, MD 01/24/20 0715

## 2020-01-28 ENCOUNTER — Ambulatory Visit (INDEPENDENT_AMBULATORY_CARE_PROVIDER_SITE_OTHER): Payer: 59 | Admitting: Family Medicine

## 2020-01-28 ENCOUNTER — Encounter: Payer: Self-pay | Admitting: Family Medicine

## 2020-01-28 ENCOUNTER — Other Ambulatory Visit: Payer: Self-pay

## 2020-01-28 VITALS — BP 108/75 | HR 103 | Temp 98.1°F | Wt 216.0 lb

## 2020-01-28 DIAGNOSIS — D509 Iron deficiency anemia, unspecified: Secondary | ICD-10-CM | POA: Diagnosis not present

## 2020-01-28 DIAGNOSIS — N63 Unspecified lump in unspecified breast: Secondary | ICD-10-CM | POA: Diagnosis not present

## 2020-01-28 DIAGNOSIS — R1030 Lower abdominal pain, unspecified: Secondary | ICD-10-CM

## 2020-01-28 DIAGNOSIS — R109 Unspecified abdominal pain: Secondary | ICD-10-CM | POA: Insufficient documentation

## 2020-01-28 MED ORDER — HYDROCODONE-ACETAMINOPHEN 5-325 MG PO TABS: 1.0000 | ORAL_TABLET | Freq: Four times a day (QID) | ORAL | 0 refills | Status: AC | PRN

## 2020-01-28 NOTE — Assessment & Plan Note (Signed)
Likely 2/2 blood loss from surgery, contributing to generalized weakness and fatigue, improving since starting supplementation. Will recheck CBC. Continue supplements.

## 2020-01-28 NOTE — Assessment & Plan Note (Signed)
S/p liposuction with fat transfer. No evidence of infection, compromised bowels on exam or history. Suspect 2/2 inflammatory changes post-op and evidenced on CT A/P. Short Norco course provided. F/u if worsens or no better in a few weeks.

## 2020-01-28 NOTE — Assessment & Plan Note (Signed)
Incidentally noted on CT A/P. Asymptomatic. Only risk factor for malignancy is obesity. Will obtain mammogram to further characterize.

## 2020-01-28 NOTE — Progress Notes (Signed)
    SUBJECTIVE:   CHIEF COMPLAINT: Hospital follow-up  HPI:   Seen at [facility] ED on 4/16 for generalized weakness, abdominal swelling, left lower extremity swelling, and extremity paresthesias after liposuction and [place] butt lift in [place] on 4/12.  In ED, only abnormality on exam was lower quadrant abdominal tenderness without appreciable lower extremity swelling.  Hemoglobin 8.8 which was thought to contribute to generalized weakness and fatigue.  EKG, CMP, chest x-ray, LE Doppler unremarkable.  CT A/P without concerns for infection although noted to have an incidental left breast mass.  Started on iron supplements.  Reports improvement in appetite, generalized weakness and fatigue since starting iron supplements. States she lost a lot of blood during the procedure. Periods are regular, last 4-5 days and are heavy the first few days. Swelling in leg has resolved. Continues with significant lower abdominal pain. Tylenol not helping. Denies difficulties with urinating, stooling, drinking. Denies vomiting, breast pain or nipple discharge. No known FH of breast cancer. Had normal mammogram 02/2019 prior to breast reduction surgery.   PERTINENT  PMH / PSH: HTN, CIN-2, obesity, depression, IDA, HSV-2, cardiac murmur  OBJECTIVE:   BP 108/75   Pulse (!) 103   Temp 98.1 F (36.7 C) (Oral)   Wt 216 lb (98 kg)   LMP 01/08/2020   SpO2 98%   BMI 37.08 kg/m   Gen: well appearing, in NAD Cardiac: systolic murmur, RRR Lungs: CTAB, normal WOB Abd: abdominal surgical sites CDI, slight TTP in b/l lower quadrants Ext: symmetric LE  ASSESSMENT/PLAN:   Anemia, iron deficiency Likely 2/2 blood loss from surgery, contributing to generalized weakness and fatigue, improving since starting supplementation. Will recheck CBC. Continue supplements.   Abdominal pain S/p liposuction with fat transfer. No evidence of infection, compromised bowels on exam or history. Suspect 2/2 inflammatory changes post-op  and evidenced on CT A/P. Short Norco course provided. F/u if worsens or no better in a few weeks.   Breast mass Incidentally noted on CT A/P. Asymptomatic. Only risk factor for malignancy is obesity. Will obtain mammogram to further characterize.    Ellwood Dense, DO [facility]

## 2020-01-28 NOTE — Patient Instructions (Addendum)
It was great to see you!  Our plans for today:  - Take the medication only as needed for pain. - We are checking some labs today, we will release these results to your MyChart. - Keep taking the iron supplements.  - We are ordering a mammogram to better assess the spot in your breast. - If you develop nausea, vomiting, fever, go to the Emergency Room to be assessed.   Take care and seek immediate care sooner if you develop any concerns.   Dr. [facility]

## 2020-01-29 LAB — CBC
Hematocrit: 26.2 % — ABNORMAL LOW (ref 34.0–46.6)
Hemoglobin: 8.3 g/dL — ABNORMAL LOW (ref 11.1–15.9)
MCH: 26.3 pg — ABNORMAL LOW (ref 26.6–33.0)
MCHC: 31.7 g/dL (ref 31.5–35.7)
MCV: 83 fL (ref 79–97)
Platelets: 429 10*3/uL (ref 150–450)
RBC: 3.15 x10E6/uL — ABNORMAL LOW (ref 3.77–5.28)
RDW: 12.8 % (ref 11.7–15.4)
WBC: 8.4 10*3/uL (ref 3.4–10.8)

## 2020-02-03 ENCOUNTER — Institutional Professional Consult (permissible substitution): Payer: 59 | Admitting: Plastic Surgery

## 2020-02-05 ENCOUNTER — Other Ambulatory Visit: Payer: Self-pay | Admitting: Family Medicine

## 2020-02-25 ENCOUNTER — Other Ambulatory Visit: Payer: Self-pay | Admitting: Student in an Organized Health Care Education/Training Program

## 2020-02-25 DIAGNOSIS — I10 Essential (primary) hypertension: Secondary | ICD-10-CM

## 2020-02-26 ENCOUNTER — Other Ambulatory Visit: Payer: Self-pay | Admitting: Family Medicine

## 2020-02-26 DIAGNOSIS — N63 Unspecified lump in unspecified breast: Secondary | ICD-10-CM

## 2020-03-01 ENCOUNTER — Other Ambulatory Visit: Payer: Self-pay

## 2020-03-01 ENCOUNTER — Ambulatory Visit
Admission: RE | Admit: 2020-03-01 | Discharge: 2020-03-01 | Disposition: A | Discharge status: Home / Self Care | Payer: 59 | Source: Ambulatory Visit | Attending: Specialist | Admitting: Specialist

## 2020-03-01 ENCOUNTER — Ambulatory Visit: Admission: RE | Admit: 2020-03-01 | Payer: 59 | Source: Ambulatory Visit

## 2020-03-01 DIAGNOSIS — R928 Other abnormal and inconclusive findings on diagnostic imaging of breast: Secondary | ICD-10-CM | POA: Diagnosis not present

## 2020-03-01 DIAGNOSIS — N63 Unspecified lump in unspecified breast: Secondary | ICD-10-CM

## 2020-03-11 ENCOUNTER — Telehealth: Payer: Self-pay | Admitting: Student in an Organized Health Care Education/Training Program

## 2020-03-11 NOTE — Telephone Encounter (Signed)
Patient needs certified letter to be able to receive a social security card. Please contact patient when letter is completed. [phone]. Thanks

## 2020-03-12 NOTE — Telephone Encounter (Signed)
Called pt back.  She simply needed a note with her address and her verification that she is a patient here.  She needed it on our letterhead.  Created letter and sent via mychart.  She will call back if more is needed.

## 2020-03-22 ENCOUNTER — Institutional Professional Consult (permissible substitution): Payer: 59 | Admitting: Plastic Surgery

## 2020-05-11 ENCOUNTER — Other Ambulatory Visit: Payer: Self-pay

## 2020-05-11 ENCOUNTER — Emergency Department (HOSPITAL_COMMUNITY)
Admission: EM | Admit: 2020-05-11 | Discharge: 2020-05-12 | Disposition: A | Discharge status: Home / Self Care | Payer: 59 | Attending: Emergency Medicine | Admitting: Emergency Medicine

## 2020-05-11 ENCOUNTER — Emergency Department (HOSPITAL_COMMUNITY): Payer: 59

## 2020-05-11 ENCOUNTER — Encounter (HOSPITAL_COMMUNITY): Payer: Self-pay

## 2020-05-11 DIAGNOSIS — Z5321 Procedure and treatment not carried out due to patient leaving prior to being seen by health care provider: Secondary | ICD-10-CM | POA: Insufficient documentation

## 2020-05-11 DIAGNOSIS — U071 COVID-19: Secondary | ICD-10-CM | POA: Diagnosis not present

## 2020-05-11 DIAGNOSIS — R0602 Shortness of breath: Secondary | ICD-10-CM | POA: Diagnosis not present

## 2020-05-11 DIAGNOSIS — R079 Chest pain, unspecified: Secondary | ICD-10-CM | POA: Diagnosis not present

## 2020-05-11 LAB — I-STAT BETA HCG BLOOD, ED (NOT ORDERABLE): I-stat hCG, quantitative: <5 m[IU]/mL (ref <5)

## 2020-05-11 LAB — BASIC METABOLIC PANEL
Anion gap: 13 (ref 5–15)
BUN: 7 mg/dL (ref 6–20)
CO2: 26 mmol/L (ref 22–32)
Calcium: 9 mg/dL (ref 8.9–10.3)
Chloride: 99 mmol/L (ref 98–111)
Creatinine, Ser: 0.71 mg/dL (ref 0.44–1.00)
GFR calc Af Amer: >60 mL/min (ref >60)
GFR calc non Af Amer: >60 mL/min (ref >60)
Glucose, Bld: 99 mg/dL (ref 70–99)
Potassium: 3.5 mmol/L (ref 3.5–5.1)
Sodium: 138 mmol/L (ref 135–145)

## 2020-05-11 LAB — CBC
HCT: 41.9 % (ref 36.0–46.0)
Hemoglobin: 12.9 g/dL (ref 12.0–15.0)
MCH: 25.7 pg — ABNORMAL LOW (ref 26.0–34.0)
MCHC: 30.8 g/dL (ref 30.0–36.0)
MCV: 83.6 fL (ref 80.0–100.0)
Platelets: 222 10*3/uL (ref 150–400)
RBC: 5.01 MIL/uL (ref 3.87–5.11)
RDW: 14.8 % (ref 11.5–15.5)
WBC: 3.4 10*3/uL — ABNORMAL LOW (ref 4.0–10.5)
nRBC: 0 % (ref 0.0–0.2)

## 2020-05-11 LAB — TROPONIN I (HIGH SENSITIVITY): Troponin I (High Sensitivity): <2 ng/L (ref <18)

## 2020-05-11 MED ORDER — SODIUM CHLORIDE 0.9% FLUSH: 3.0000 mL | Freq: Once | INTRAVENOUS | Status: DC

## 2020-05-11 NOTE — ED Triage Notes (Signed)
Patient c/o SOB, chest pain, and loss of smell , and body aches a few days ago. 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 Patient states that she has not had a Covid vaccine.

## 2020-05-12 ENCOUNTER — Telehealth: Payer: Self-pay | Admitting: *Deleted

## 2020-05-12 DIAGNOSIS — U071 COVID-19: Secondary | ICD-10-CM | POA: Diagnosis not present

## 2020-05-12 DIAGNOSIS — Z5321 Procedure and treatment not carried out due to patient leaving prior to being seen by health care provider: Secondary | ICD-10-CM | POA: Diagnosis not present

## 2020-05-12 LAB — SARS CORONAVIRUS 2 BY RT PCR (HOSPITAL ORDER, PERFORMED IN ~~LOC~~ HOSPITAL LAB): SARS Coronavirus 2: POSITIVE — AB

## 2020-05-12 NOTE — ED Notes (Signed)
Received from lab that patient's test is COVID +

## 2020-05-12 NOTE — Telephone Encounter (Signed)
Patient tested positive for covid.  She wants to know if her children should get tested as well.   2 have no symptoms and the 36 year old had symptoms but is getting better.  Spoke with Dr. Pollie Meyer, she would recommend them getting tested to ensure appropriate amount of quarantine time is determined.  Mom agreeable with plan. Jone Baseman, CMA

## 2020-05-13 ENCOUNTER — Encounter: Payer: Self-pay | Admitting: Oncology

## 2020-05-15 ENCOUNTER — Telehealth: Payer: Self-pay | Admitting: Family Medicine

## 2020-05-15 NOTE — Telephone Encounter (Signed)
**  After hours emergency line call**  Christine Cervantes is a 34 year old female recently diagnosed with Covid on 8/4 calling in to the after-hours line due to persistent symptoms.  She is a Psychologist, sport and exercise at [employer], had not yet been vaccinated but was just about to until she recently was diagnosed.  She believes she has likely been symptomatic since July 31, initially started with cough.    She reports that she is feeling very weak, nauseous, and vomiting everything she tries to eat/drink.  Has not eaten anything in 2 days.  Has tried to take small sips of Gatorade and ginger ale, however comes right back up.  No associated diarrhea.  Not feeling short of breath currently.  She is frustrated because she knows she probably needs IV fluids to allow her to feel better, however the ED wait times have been extensive recently.  Also has children at home.  She has Zofran at home, has not used this.  Discussed trialing a very small sip of fluids to help get down Zofran to see if with this she can then tolerate continue sips to maintain hydration overnight.  Recommended proceeding with urgent care (if open) or ED now if the above does not help or sooner if any worsening of symptoms including difficulty breathing or feeling dizziness/presyncopal.  Will route to PCP.  Allayne Stack, DO

## 2020-05-16 ENCOUNTER — Encounter (HOSPITAL_COMMUNITY): Payer: Self-pay | Admitting: *Deleted

## 2020-05-16 ENCOUNTER — Emergency Department (HOSPITAL_COMMUNITY): Payer: 59

## 2020-05-16 ENCOUNTER — Emergency Department (HOSPITAL_COMMUNITY)
Admission: EM | Admit: 2020-05-16 | Discharge: 2020-05-16 | Disposition: A | Discharge status: Home / Self Care | Payer: 59 | Attending: Emergency Medicine | Admitting: Emergency Medicine

## 2020-05-16 ENCOUNTER — Other Ambulatory Visit: Payer: Self-pay

## 2020-05-16 DIAGNOSIS — R000 Tachycardia, unspecified: Secondary | ICD-10-CM | POA: Insufficient documentation

## 2020-05-16 DIAGNOSIS — U071 COVID-19: Secondary | ICD-10-CM | POA: Insufficient documentation

## 2020-05-16 DIAGNOSIS — R112 Nausea with vomiting, unspecified: Secondary | ICD-10-CM | POA: Diagnosis not present

## 2020-05-16 DIAGNOSIS — J9811 Atelectasis: Secondary | ICD-10-CM | POA: Diagnosis not present

## 2020-05-16 DIAGNOSIS — R531 Weakness: Secondary | ICD-10-CM | POA: Diagnosis not present

## 2020-05-16 DIAGNOSIS — R0789 Other chest pain: Secondary | ICD-10-CM | POA: Diagnosis not present

## 2020-05-16 DIAGNOSIS — R05 Cough: Secondary | ICD-10-CM | POA: Diagnosis present

## 2020-05-16 DIAGNOSIS — R0602 Shortness of breath: Secondary | ICD-10-CM | POA: Diagnosis not present

## 2020-05-16 DIAGNOSIS — R1110 Vomiting, unspecified: Secondary | ICD-10-CM | POA: Diagnosis not present

## 2020-05-16 LAB — BASIC METABOLIC PANEL
Anion gap: 8 (ref 5–15)
BUN: 9 mg/dL (ref 6–20)
CO2: 29 mmol/L (ref 22–32)
Calcium: 8.3 mg/dL — ABNORMAL LOW (ref 8.9–10.3)
Chloride: 99 mmol/L (ref 98–111)
Creatinine, Ser: 0.84 mg/dL (ref 0.44–1.00)
GFR calc Af Amer: >60 mL/min (ref >60)
GFR calc non Af Amer: >60 mL/min (ref >60)
Glucose, Bld: 105 mg/dL — ABNORMAL HIGH (ref 70–99)
Potassium: 3.3 mmol/L — ABNORMAL LOW (ref 3.5–5.1)
Sodium: 136 mmol/L (ref 135–145)

## 2020-05-16 LAB — CBC WITH DIFFERENTIAL/PLATELET
Abs Immature Granulocytes: 0.02 10*3/uL (ref 0.00–0.07)
Basophils Absolute: 0 10*3/uL (ref 0.0–0.1)
Basophils Relative: 0 %
Eosinophils Absolute: 0 10*3/uL (ref 0.0–0.5)
Eosinophils Relative: 0 %
HCT: 40.1 % (ref 36.0–46.0)
Hemoglobin: 12.6 g/dL (ref 12.0–15.0)
Immature Granulocytes: 1 %
Lymphocytes Relative: 29 %
Lymphs Abs: 1.1 10*3/uL (ref 0.7–4.0)
MCH: 25.7 pg — ABNORMAL LOW (ref 26.0–34.0)
MCHC: 31.4 g/dL (ref 30.0–36.0)
MCV: 81.7 fL (ref 80.0–100.0)
Monocytes Absolute: 0.3 10*3/uL (ref 0.1–1.0)
Monocytes Relative: 8 %
Neutro Abs: 2.4 10*3/uL (ref 1.7–7.7)
Neutrophils Relative %: 62 %
Platelets: 131 10*3/uL — ABNORMAL LOW (ref 150–400)
RBC: 4.91 MIL/uL (ref 3.87–5.11)
RDW: 14.3 % (ref 11.5–15.5)
WBC: 3.9 10*3/uL — ABNORMAL LOW (ref 4.0–10.5)
nRBC: 0 % (ref 0.0–0.2)

## 2020-05-16 LAB — I-STAT BETA HCG BLOOD, ED (MC, WL, AP ONLY): I-stat hCG, quantitative: <5 m[IU]/mL (ref <5)

## 2020-05-16 MED ORDER — ONDANSETRON 4 MG PO TBDP
4.0000 mg | ORAL_TABLET | Freq: Three times a day (TID) | ORAL | 0 refills | Status: DC | PRN
Start: 2020-05-16 — End: 2021-03-16

## 2020-05-16 MED ORDER — EPINEPHRINE 0.3 MG/0.3ML IJ SOAJ: 0.3000 mg | Freq: Once | INTRAMUSCULAR | Status: DC | PRN

## 2020-05-16 MED ORDER — ALBUTEROL SULFATE HFA 108 (90 BASE) MCG/ACT IN AERS: 2.0000 | INHALATION_SPRAY | Freq: Once | RESPIRATORY_TRACT | Status: DC | PRN

## 2020-05-16 MED ORDER — METOCLOPRAMIDE HCL 5 MG/ML IJ SOLN
10.0000 mg | Freq: Once | INTRAMUSCULAR | Status: AC
  Administered 2020-05-16: 10 mg via INTRAVENOUS
  Filled 2020-05-16: qty 2

## 2020-05-16 MED ORDER — SODIUM CHLORIDE 0.9 % IV BOLUS
1000.0000 mL | Freq: Once | INTRAVENOUS | Status: AC
  Administered 2020-05-16: 1000 mL via INTRAVENOUS

## 2020-05-16 MED ORDER — SODIUM CHLORIDE 0.9 % IV SOLN: INTRAVENOUS | Status: DC | PRN

## 2020-05-16 MED ORDER — ACETAMINOPHEN 325 MG PO TABS
650.0000 mg | ORAL_TABLET | Freq: Once | ORAL | Status: AC
  Administered 2020-05-16: 650 mg via ORAL
  Filled 2020-05-16: qty 2

## 2020-05-16 MED ORDER — SODIUM CHLORIDE 0.9 % IV SOLN: 1200.0000 mg | Freq: Once | INTRAVENOUS | Status: DC

## 2020-05-16 MED ORDER — METHYLPREDNISOLONE SODIUM SUCC 125 MG IJ SOLR: 125.0000 mg | Freq: Once | INTRAMUSCULAR | Status: DC | PRN

## 2020-05-16 MED ORDER — DIPHENHYDRAMINE HCL 50 MG/ML IJ SOLN: 50.0000 mg | Freq: Once | INTRAMUSCULAR | Status: DC | PRN

## 2020-05-16 MED ORDER — ALBUTEROL SULFATE HFA 108 (90 BASE) MCG/ACT IN AERS
1.0000 | INHALATION_SPRAY | Freq: Once | RESPIRATORY_TRACT | Status: DC
  Filled 2020-05-16: qty 6.7

## 2020-05-16 MED ORDER — FAMOTIDINE IN NACL 20-0.9 MG/50ML-% IV SOLN: 20.0000 mg | Freq: Once | INTRAVENOUS | Status: DC | PRN

## 2020-05-16 NOTE — Discharge Instructions (Addendum)
Use the inhaler as needed. You can take Tylenol ibuprofen as needed for fevers. Zofran as needed for nausea. You can follow-up at the [facility] clinic. Return to the ER if you start to have worsening shortness of breath, chest pain, uncontrollable vomiting.

## 2020-05-16 NOTE — ED Triage Notes (Signed)
Pt diagnosed with COVID last week. Pt complains of weakness, generalized muscle aches, nausea vomiting. Symptoms have been worse over the past couple of days.

## 2020-05-16 NOTE — ED Provider Notes (Signed)
[facility]-EMERGENCY DEPT Provider Note   CSN: [id number] Arrival date & time: 05/16/20  1434     History Chief Complaint  Patient presents with   Weakness    covid positive   Generalized Body Aches   Emesis    Christine Cervantes is a 34 y.o. female with a past medical history of depression, anemia presenting to the ED with a chief complaint of Covid infection.  Diagnosed with Covid approximately 1 week ago.  That was day one of her symptoms.  Reports cough productive with mucus, generalized body aches, generalized weakness, nausea, nonbloody, nonbilious emesis with any eating, shortness of breath, chest tightness, diarrhea.  She has not yet been vaccinated against Covid.  She has been taking DayQuil, over-the-counter arthritis medications to help with her symptoms with some improvement.  She took Zofran starting yesterday without much improvement in her nausea.  Denies any chest pain with exertion, abdominal pain, fevers, vision changes. She is unsure if she could be pregnant.  HPI     Past Medical History:  Diagnosis Date   Anemia    Depression    Headache(784.0)    Pregnancy induced hypertension    no meds   Vaginal Pap smear, abnormal     Patient Active Problem List   Diagnosis Date Noted   Abdominal pain 01/28/2020   Breast mass 01/28/2020   Pre-operative clearance 01/04/2020   Cardiac murmur 12/30/2019   Encounter for annual physical exam 12/22/2019   Herpes simplex type 2 infection 08/05/2019   Cervical cancer screening 08/04/2019   Ankle pain, chronic 08/04/2019   Environmental and seasonal allergies 01/23/2019   Back pain 12/10/2018   Screen for STD (sexually transmitted disease) 11/21/2018   CIN II (cervical intraepithelial neoplasia II) 09/06/2017   Contraceptive management 05/03/2017   Anemia, iron deficiency 02/25/2013   Essential hypertension 02/17/2013   Depression 07/05/2009   OBESITY 03/04/2008    Past  Surgical History:  Procedure Laterality Date   BREAST REDUCTION SURGERY Bilateral 03/12/2019   Procedure: BILATERAL MAMMARY REDUCTION  (BREAST);  Surgeon: Peggye Form, DO;  Location: [facility];  Service: Plastics;  Laterality: Bilateral;  3.5 hours, please   FOOT SURGERY  2004-2004   corns removed from both feet, hammertoes repaired   GYNECOLOGIC CRYOSURGERY     HAMMER TOE SURGERY     REDUCTION MAMMAPLASTY Bilateral 03/12/2019     OB History    Gravida  6   Para  4   Term  2   Preterm  2   AB  2   Living  4     SAB  2   TAB      Ectopic      Multiple  0   Live Births  4           Family History  Problem Relation Age of Onset   Hypertension Mother    Hypertension Maternal Aunt    Hypertension Maternal Grandmother    Diabetes Paternal Grandmother    Anesthesia problems Neg Hx    Other Neg Hx     Social History   Tobacco Use   Smoking status: Never Smoker   Smokeless tobacco: Never Used  Vaping Use   Vaping Use: Never used  Substance Use Topics   Alcohol use: Yes    Alcohol/week: 0.0 standard drinks    Comment: occ before preg   Drug use: No    Home Medications Prior to Admission medications  Medication Sig Start Date End Date Taking? Authorizing Provider  acetaminophen (TYLENOL) 325 MG tablet Take 650 mg by mouth every 6 (six) hours as needed for mild pain or headache.    [provider]  Ascorbic Acid (VITAMIN C PO) Take 1 tablet by mouth daily.    [provider]  cephALEXin (KEFLEX) 500 MG capsule Take 500 mg by mouth 2 (two) times daily. 7 day supply 01/19/20   [provider]  ferrous sulfate 325 (65 FE) MG tablet Take 1 tablet (325 mg total) by mouth daily. 01/23/20   Zell Doucette, PA-C  hydrochlorothiazide (HYDRODIURIL) 12.5 MG tablet Take 1 tablet by mouth once daily 02/25/20   Jamelle Rushing L, DO  meloxicam (MOBIC) 15 MG tablet Take 1 tablet (15 mg total) by mouth  daily. Patient not taking: Reported on 01/23/2020 11/25/19   Hyatt, Max T, DPM  Menthol-Methyl Salicylate (MUSCLE RUB) 10-15 % CREA Apply 1 application topically as needed for muscle pain.    [provider]  methylPREDNISolone (MEDROL DOSEPAK) 4 MG TBPK tablet 6 day dose pack - take as directed Patient not taking: Reported on 01/23/2020 11/25/19   Hyatt, Max T, DPM  ondansetron (ZOFRAN ODT) 4 MG disintegrating tablet Take 1 tablet (4 mg total) by mouth every 8 (eight) hours as needed for nausea or vomiting. 05/16/20   Keanna Tugwell, PA-C  VITAMIN E PO Take 1 tablet by mouth daily.    [provider]  Ferrous Sulfate (IRON PO) Take by mouth daily. Liquid OTC iron not sure of dose  01/23/20  [provider]    Allergies    Latex  Review of Systems   Review of Systems  Constitutional: Positive for chills. Negative for appetite change and fever.  HENT: Positive for congestion. Negative for ear pain, rhinorrhea, sneezing and sore throat.   Eyes: Negative for photophobia and visual disturbance.  Respiratory: Positive for cough, chest tightness and shortness of breath. Negative for wheezing.   Cardiovascular: Positive for chest pain. Negative for palpitations.  Gastrointestinal: Positive for diarrhea, nausea and vomiting. Negative for abdominal pain, blood in stool and constipation.  Genitourinary: Negative for dysuria, hematuria and urgency.  Musculoskeletal: Positive for myalgias.  Skin: Negative for rash.  Neurological: Negative for dizziness, weakness and light-headedness.    Physical Exam Updated Vital Signs BP (!) 123/92    Pulse 79    Temp 98.7 F (37.1 C) (Oral)    Resp 20    Ht 5\' 4"  (1.626 m)    Wt 92.1 kg    LMP 05/01/2020    SpO2 98%    BMI 34.84 kg/m   Physical Exam Vitals and nursing note reviewed.  Constitutional:      General: She is not in acute distress.    Appearance: She is well-developed.     Comments: Speaking in complete sentences without  difficulty.  Cough noted.  HENT:     Head: Normocephalic and atraumatic.     Nose: Nose normal.  Eyes:     General: No scleral icterus.       Right eye: No discharge.        Left eye: No discharge.     Conjunctiva/sclera: Conjunctivae normal.  Cardiovascular:     Rate and Rhythm: Regular rhythm. Tachycardia present.     Heart sounds: Normal heart sounds. No murmur heard.  No friction rub. No gallop.   Pulmonary:     Effort: Pulmonary effort is normal. No respiratory distress.  Breath sounds: Normal breath sounds.  Abdominal:     General: Bowel sounds are normal. There is no distension.     Palpations: Abdomen is soft.     Tenderness: There is no abdominal tenderness. There is no guarding.  Musculoskeletal:        General: Normal range of motion.     Cervical back: Normal range of motion and neck supple.  Skin:    General: Skin is warm and dry.     Findings: No rash.  Neurological:     Mental Status: She is alert.     Motor: No abnormal muscle tone.     Coordination: Coordination normal.     ED Results / Procedures / Treatments   Labs (all labs ordered are listed, but only abnormal results are displayed) Labs Reviewed  BASIC METABOLIC PANEL - Abnormal; Notable for the following components:      Result Value   Potassium 3.3 (*)    Glucose, Bld 105 (*)    Calcium 8.3 (*)    All other components within normal limits  CBC WITH DIFFERENTIAL/PLATELET - Abnormal; Notable for the following components:   WBC 3.9 (*)    MCH 25.7 (*)    Platelets 131 (*)    All other components within normal limits  I-STAT BETA HCG BLOOD, ED ([facility], [facility], [facility] ONLY)    EKG EKG Interpretation  Date/Time:  Sunday May 16 2020 16:01:37 EDT Ventricular Rate:  91 PR Interval:    QRS Duration: 81 QT Interval:  344 QTC Calculation: 424 R Axis:   73 Text Interpretation: Sinus rhythm No significant change since last tracing Confirmed by Richardean CanalYao, David H [phone]([id number]) on 05/16/2020 4:36:42  PM   Radiology DG Chest Portable 1 View  Result Date: 05/16/2020 CLINICAL DATA:  Shortness of breath. Diagnosed with Covid last week. Weakness, muscle aches, nausea, vomiting. EXAM: PORTABLE CHEST 1 VIEW COMPARISON:  05/11/2020 FINDINGS: Shallow lung inflation. Low lung volumes accentuate bronchovascular markings. There is patchy opacity at the MEDIAL LEFT lung base and LATERAL RIGHT lung base. Minimal subsegmental atelectasis at the LATERAL LEFT lung base. IMPRESSION: Bibasilar opacities, consistent with infectious infiltrates. Electronically Signed   By: Norva PavlovElizabeth  Brown M.D.   On: 05/16/2020 16:22    Procedures Procedures (including critical care time)  Medications Ordered in ED Medications  albuterol (VENTOLIN HFA) 108 (90 Base) MCG/ACT inhaler 1-2 puff (has no administration in time range)  sodium chloride 0.9 % bolus 1,000 mL (0 mLs Intravenous Stopped 05/16/20 2024)  metoCLOPramide (REGLAN) injection 10 mg (10 mg Intravenous Given 05/16/20 1619)  acetaminophen (TYLENOL) tablet 650 mg (650 mg Oral Given 05/16/20 1715)    ED Course  I have reviewed the triage vital signs and the nursing notes.  Pertinent labs & imaging results that were available during my care of the patient were reviewed by me and considered in my medical decision making (see chart for details).  Clinical Course as of May 16 2199  Wynelle LinkSun May 16, 2020  1916 Oral temp improved to 98.68F.   [HK]    Clinical Course User Index [HK] Dietrich PatesKhatri, Joclyn Alsobrook, [place]PA-C   MDM Rules/Calculators/A&P                          Janan Haltermanda Mae Janosik was evaluated in Emergency Department on 05/16/20  for the symptoms described in the history of present illness. He/she was evaluated in the context of the global COVID-19 pandemic, which necessitated consideration that the  patient might be at risk for infection with the SARS-CoV-2 virus that causes COVID-19. Institutional protocols and algorithms that pertain to the evaluation of patients at risk for  COVID-19 are in a state of rapid change based on information released by regulatory bodies including the CDC and federal and state organizations. These policies and algorithms were followed during the patient's care in the ED.  34 year old female with a past medical history of depression anemia with a known Covid infection diagnosed 1 week ago presenting to the ED for cough productive with mucus, generalized body aches, generalized weakness, emesis cough and shortness of breath.  Patient febrile here on arrival.  She has been taking over-the-counter medications such as DayQuil with only minimal improvement in her symptoms.  Chest x-ray done last week before she was evaluated by ED provider showed no acute findings.  Chest x-ray today shows findings consistent with Covid infection.  EKG shows sinus rhythm.  On exam patient initially tachycardic which improved with defervescence.  Lungs are clear to auscultation bilaterally.  BMP, CBC unremarkable.  Patient was given IV fluids, Reglan and Tylenol here with improvement in her tachycardia, fever and symptoms.  Oxygen saturations remained above 98% on room air she is not requiring supplemental oxygen.  I suspect that her symptoms are due to her Covid infection.  I will refer her to the [facility] infusion clinic and [place] post Covid clinic.  I tried to have the [facility] infusion done in the ER but unfortunately we are unable to do this in this ER.  She is comfortable with following up.  Will give Zofran as needed at home and albuterol as needed.  Strict return precautions given.    Patient is hemodynamically stable, in NAD, and able to ambulate in the ED. Evaluation does not show pathology that would require ongoing emergent intervention or inpatient treatment. I explained the diagnosis to the patient. Pain has been managed and has no complaints prior to discharge. Patient is comfortable with above plan and is stable for discharge at this time. All questions were answered  prior to disposition. Strict return precautions for returning to the ED were discussed. Encouraged follow up with PCP.   An After Visit Summary was printed and given to the patient.   Portions of this note were generated with Scientist, clinical (histocompatibility and immunogenetics). Dictation errors may occur despite best attempts at proofreading.  Final Clinical Impression(s) / ED Diagnoses Final diagnoses:  COVID-19 virus infection    Rx / DC Orders ED Discharge Orders         Ordered    ondansetron (ZOFRAN ODT) 4 MG disintegrating tablet  Every 8 hours PRN     Discontinue  Reprint     05/16/20 2158           Dietrich Pates, PA-C 05/16/20 2200    Charlynne Pander, MD 05/16/20 [phone]

## 2020-05-17 ENCOUNTER — Telehealth: Payer: Self-pay | Admitting: Nurse Practitioner

## 2020-05-17 ENCOUNTER — Ambulatory Visit (HOSPITAL_COMMUNITY)
Admission: RE | Admit: 2020-05-17 | Discharge: 2020-05-17 | Disposition: A | Discharge status: Home / Self Care | Payer: 59 | Source: Ambulatory Visit | Attending: Pulmonary Disease | Admitting: Pulmonary Disease

## 2020-05-17 ENCOUNTER — Other Ambulatory Visit: Payer: Self-pay | Admitting: Nurse Practitioner

## 2020-05-17 ENCOUNTER — Telehealth: Payer: Self-pay | Admitting: Adult Health

## 2020-05-17 DIAGNOSIS — U071 COVID-19: Secondary | ICD-10-CM | POA: Insufficient documentation

## 2020-05-17 DIAGNOSIS — I10 Essential (primary) hypertension: Secondary | ICD-10-CM

## 2020-05-17 MED ORDER — DIPHENHYDRAMINE HCL 50 MG/ML IJ SOLN: 50.0000 mg | Freq: Once | INTRAMUSCULAR | Status: DC | PRN

## 2020-05-17 MED ORDER — METHYLPREDNISOLONE SODIUM SUCC 125 MG IJ SOLR: 125.0000 mg | Freq: Once | INTRAMUSCULAR | Status: DC | PRN

## 2020-05-17 MED ORDER — EPINEPHRINE 0.3 MG/0.3ML IJ SOAJ: 0.3000 mg | Freq: Once | INTRAMUSCULAR | Status: DC | PRN

## 2020-05-17 MED ORDER — SODIUM CHLORIDE 0.9 % IV SOLN
1200.0000 mg | Freq: Once | INTRAVENOUS | Status: AC
  Administered 2020-05-17: 1200 mg via INTRAVENOUS
  Filled 2020-05-17: qty 1200

## 2020-05-17 MED ORDER — ALBUTEROL SULFATE HFA 108 (90 BASE) MCG/ACT IN AERS: 2.0000 | INHALATION_SPRAY | Freq: Once | RESPIRATORY_TRACT | Status: DC | PRN

## 2020-05-17 MED ORDER — FAMOTIDINE IN NACL 20-0.9 MG/50ML-% IV SOLN: 20.0000 mg | Freq: Once | INTRAVENOUS | Status: DC | PRN

## 2020-05-17 MED ORDER — SODIUM CHLORIDE 0.9 % IV SOLN: INTRAVENOUS | Status: DC | PRN

## 2020-05-17 NOTE — Progress Notes (Signed)
I connected by phone with Christine Cervantes on 05/17/2020 at 11:23 AM to discuss the potential use of a new treatment for mild to moderate COVID-19 viral infection in non-hospitalized patients.  This patient is a 34 y.o. female that meets the FDA criteria for Emergency Use Authorization of COVID monoclonal antibody casirivimab/imdevimab.  Has a (+) direct SARS-CoV-2 viral test result  Has mild or moderate COVID-19   Is NOT hospitalized due to COVID-19  Is within 10 days of symptom onset  Has at least one of the high risk factor(s) for progression to severe COVID-19 and/or hospitalization as defined in EUA.  Specific high risk criteria : Cardiovascular disease or hypertension   I have spoken and communicated the following to the patient or parent/caregiver regarding COVID monoclonal antibody treatment:  1. FDA has authorized the emergency use for the treatment of mild to moderate COVID-19 in adults and pediatric patients with positive results of direct SARS-CoV-2 viral testing who are 106 years of age and older weighing at least 40 kg, and who are at high risk for progressing to severe COVID-19 and/or hospitalization.  2. The significant known and potential risks and benefits of COVID monoclonal antibody, and the extent to which such potential risks and benefits are unknown.  3. Information on available alternative treatments and the risks and benefits of those alternatives, including clinical trials.  4. Patients treated with COVID monoclonal antibody should continue to self-isolate and use infection control measures (e.g., wear mask, isolate, social distance, avoid sharing personal items, clean and disinfect "high touch" surfaces, and frequent handwashing) according to CDC guidelines.   5. The patient or parent/caregiver has the option to accept or refuse COVID monoclonal antibody treatment.  After reviewing this information with the patient, The patient agreed to proceed with receiving  casirivimab\imdevimab infusion and will be provided a copy of the Fact sheet prior to receiving the infusion. Jake Samples Pickenpack-Cousar 05/17/2020 11:23 AM

## 2020-05-17 NOTE — Discharge Instructions (Signed)

## 2020-05-17 NOTE — Telephone Encounter (Signed)
Called to discuss with Christine Cervantes about Covid symptoms and the use of casirivimab/imdevimab, a combination monoclonal antibody infusion for those with mild to moderate Covid symptoms and at a high risk of hospitalization.     Pt is qualified for this infusion at the [facility] infusion center due to co-morbid conditions (BMI > 25/hypertension).   Sx onset 05/08/20. Patient verbalized understanding of infusion and appointment details. Scheduled for 05/17/20 @ 1230.   Patient Active Problem List   Diagnosis Date Noted  . Abdominal pain 01/28/2020  . Breast mass 01/28/2020  . Pre-operative clearance 01/04/2020  . Cardiac murmur 12/30/2019  . Encounter for annual physical exam 12/22/2019  . Herpes simplex type 2 infection 08/05/2019  . Cervical cancer screening 08/04/2019  . Ankle pain, chronic 08/04/2019  . Environmental and seasonal allergies 01/23/2019  . Back pain 12/10/2018  . Screen for STD (sexually transmitted disease) 11/21/2018  . CIN II (cervical intraepithelial neoplasia II) 09/06/2017  . Contraceptive management 05/03/2017  . Anemia, iron deficiency 02/25/2013  . Essential hypertension 02/17/2013  . Depression 07/05/2009  . OBESITY 03/04/2008    Willette Alma, AGPCNP-BC

## 2020-05-17 NOTE — Telephone Encounter (Signed)
Called and unable to [facility] regarding monoclonal antibody treatment for COVID 19 given to those who are at risk for complications and/or hospitalization of the virus.  Patient meets criteria based on: BMI greater than 25  Call back number: [phone]  My chart message: sent  Lillard Anes, NP

## 2020-05-17 NOTE — Progress Notes (Signed)
  Diagnosis: COVID-19  Physician: Dr. Patrick Wright  Procedure: Covid Infusion Clinic Med: casirivimab\imdevimab infusion - Provided patient with casirivimab\imdevimab fact sheet for patients, parents and caregivers prior to infusion.  Complications: No immediate complications noted.  Discharge: Discharged home   Christine Cervantes 05/17/2020  

## 2020-05-25 ENCOUNTER — Other Ambulatory Visit: Payer: Self-pay

## 2020-05-25 ENCOUNTER — Ambulatory Visit (INDEPENDENT_AMBULATORY_CARE_PROVIDER_SITE_OTHER): Payer: 59 | Admitting: Nurse Practitioner

## 2020-05-25 VITALS — BP 108/64 | HR 75 | Temp 96.8°F | Ht 64.0 in | Wt 201.0 lb

## 2020-05-25 DIAGNOSIS — U071 COVID-19: Secondary | ICD-10-CM | POA: Insufficient documentation

## 2020-05-25 DIAGNOSIS — R0602 Shortness of breath: Secondary | ICD-10-CM | POA: Diagnosis not present

## 2020-05-25 DIAGNOSIS — J1282 Pneumonia due to coronavirus disease 2019: Secondary | ICD-10-CM

## 2020-05-25 DIAGNOSIS — R059 Cough, unspecified: Secondary | ICD-10-CM | POA: Insufficient documentation

## 2020-05-25 DIAGNOSIS — R05 Cough: Secondary | ICD-10-CM

## 2020-05-25 MED ORDER — AZITHROMYCIN 250 MG PO TABS: ORAL_TABLET | ORAL | 0 refills | Status: DC

## 2020-05-25 MED ORDER — BENZONATATE 100 MG PO CAPS: 100.0000 mg | ORAL_CAPSULE | Freq: Two times a day (BID) | ORAL | 0 refills | Status: DC | PRN

## 2020-05-25 MED ORDER — PREDNISONE 20 MG PO TABS: 20.0000 mg | ORAL_TABLET | Freq: Every day | ORAL | 0 refills | Status: AC

## 2020-05-25 NOTE — Progress Notes (Signed)
'@Patient'  ID: Christine Cervantes, female    DOB: [birth year], 34 y.o.   MRN: [id number]  Chief Complaint  Patient presents with  . Establish Care    Tested positive 8/4, recieved Infusion 8/9 Sx: cough, fatigue, no appetite    Referring provider: Richarda Osmond, DO   34 year old female with history of HTN and anemia. Diagnosed with Covid August 2021.   HPI  Patient presents today for post Covid care clinic visit.  Patient was seen in the ED on 05/16/2020.  Chest x-ray showed Covid pneumonia bilateral.  Patient was treated with IV fluids, Reglan, Tylenol with improvement in her tachycardia fever and symptoms while in the ED.  Patient did receive monoclonal antibody infusion on 05/17/2020.  Patient was prescribed Zofran and albuterol at ED discharge.  Patient states that she has not improved.  She states that she is still having cough and chest congestion and weakness.  She has been using her albuterol inhaler but states that it has not helped.  She does feel short of breath with exertion.Denies f/c/s, n/v/d, hemoptysis, PND, chest pain or edema.      Allergies  Allergen Reactions  . Latex Swelling and Rash    vaginally    Immunization History  Administered Date(s) Administered  . Influenza Split 01/03/2013  . Influenza Whole 07/16/2008  . Influenza,inj,Quad PF,6+ Mos 08/04/2019  . Influenza-Unspecified 07/09/2018  . MMR 01/30/2017  . PPD Test 01/30/2017, 05/19/2019  . Tdap 01/03/2013, 02/10/2016    Past Medical History:  Diagnosis Date  . Anemia   . Depression   . Headache(784.0)   . Pregnancy induced hypertension    no meds  . Vaginal Pap smear, abnormal     Tobacco History: Social History   Tobacco Use  Smoking Status Never Smoker  Smokeless Tobacco Never Used   Counseling given: Not Answered   Outpatient Encounter Medications as of 05/25/2020  Medication Sig  . acetaminophen (TYLENOL) 325 MG tablet Take 650 mg by mouth every 6 (six) hours as needed for mild  pain or headache.  . Ascorbic Acid (VITAMIN C PO) Take 1 tablet by mouth daily.  . ferrous sulfate 325 (65 FE) MG tablet Take 1 tablet (325 mg total) by mouth daily.  . hydrochlorothiazide (HYDRODIURIL) 12.5 MG tablet Take 1 tablet by mouth once daily  . ondansetron (ZOFRAN ODT) 4 MG disintegrating tablet Take 1 tablet (4 mg total) by mouth every 8 (eight) hours as needed for nausea or vomiting.  Marland Kitchen VITAMIN E PO Take 1 tablet by mouth daily.  Marland Kitchen azithromycin (ZITHROMAX) 250 MG tablet Take 2 tablets (500 mg) on day 1, then take 1 tablet (250 mg) on days 2-5  . benzonatate (TESSALON) 100 MG capsule Take 1 capsule (100 mg total) by mouth 2 (two) times daily as needed for cough.  . meloxicam (MOBIC) 15 MG tablet Take 1 tablet (15 mg total) by mouth daily. (Patient not taking: Reported on 01/23/2020)  . Menthol-Methyl Salicylate (MUSCLE RUB) 10-15 % CREA Apply 1 application topically as needed for muscle pain.  . predniSONE (DELTASONE) 20 MG tablet Take 1 tablet (20 mg total) by mouth daily with breakfast for 5 days.  . [DISCONTINUED] cephALEXin (KEFLEX) 500 MG capsule Take 500 mg by mouth 2 (two) times daily. 7 day supply  . [DISCONTINUED] Ferrous Sulfate (IRON PO) Take by mouth daily. Liquid OTC iron not sure of dose  . [DISCONTINUED] methylPREDNISolone (MEDROL DOSEPAK) 4 MG TBPK tablet 6 day dose pack - take  as directed (Patient not taking: Reported on 01/23/2020)   No facility-administered encounter medications on file as of 05/25/2020.     Review of Systems  Review of Systems  Constitutional: Positive for appetite change (decreased) and fatigue. Negative for chills and fever.  HENT: Negative.   Respiratory: Positive for cough and shortness of breath.   Cardiovascular: Negative.   Gastrointestinal: Negative.   Allergic/Immunologic: Negative.   Neurological: Negative.   Psychiatric/Behavioral: Negative.        Physical Exam  BP 108/64   Pulse 75   Temp (!) 96.8 F (36 C)   Ht '5\' 4"'   (1.626 m)   Wt 201 lb 0.1 oz (91.2 kg)   LMP 05/01/2020   SpO2 99%   BMI 34.50 kg/m   Wt Readings from Last 5 Encounters:  05/25/20 201 lb 0.1 oz (91.2 kg)  05/16/20 203 lb (92.1 kg)  01/28/20 216 lb (98 kg)  01/23/20 201 lb 12.8 oz (91.5 kg)  01/02/20 201 lb 12.8 oz (91.5 kg)     Physical Exam Vitals and nursing note reviewed.  Constitutional:      General: She is not in acute distress.    Appearance: She is well-developed.  Cardiovascular:     Rate and Rhythm: Normal rate and regular rhythm.  Pulmonary:     Effort: Pulmonary effort is normal.     Breath sounds: Normal breath sounds.  Musculoskeletal:     Right lower leg: No edema.     Left lower leg: No edema.  Neurological:     Mental Status: She is alert and oriented to person, place, and time.      Imaging: DG Chest 2 View  Result Date: 05/11/2020 CLINICAL DATA:  Chest pain and shortness of breath. EXAM: CHEST - 2 VIEW COMPARISON:  January 23, 2020 FINDINGS: The heart size and mediastinal contours are within normal limits. Both lungs are clear. Bilateral radiopaque nipple piercings are noted. The visualized skeletal structures are unremarkable. IMPRESSION: No active cardiopulmonary disease. Electronically Signed   By: Virgina Norfolk M.D.   On: 05/11/2020 17:54   DG Chest Portable 1 View  Result Date: 05/16/2020 CLINICAL DATA:  Shortness of breath. Diagnosed with Covid last week. Weakness, muscle aches, nausea, vomiting. EXAM: PORTABLE CHEST 1 VIEW COMPARISON:  05/11/2020 FINDINGS: Shallow lung inflation. Low lung volumes accentuate bronchovascular markings. There is patchy opacity at the MEDIAL LEFT lung base and LATERAL RIGHT lung base. Minimal subsegmental atelectasis at the LATERAL LEFT lung base. IMPRESSION: Bibasilar opacities, consistent with infectious infiltrates. Electronically Signed   By: Nolon Nations M.D.   On: 05/16/2020 16:22     Assessment & Plan:   Pneumonia due to COVID-19 virus Shortness of  breath Cough Fatigue:  Walked in office today O2 sats remained above 97% for entire walk and heart rate was stable  Stay active  Stay well hydrated  May start vitamin C, Zinc, and vitamin D3  May take mucinex DM twice daily  Deep breathing exercises  Will order azithromycin  Will order tessalon perles  Will order prednisone  May continue albuterol inhaler as needed   Follow up:  Follow up in 2 weeks - will need repeat labs and chest x ray      Fenton Foy, NP 05/25/2020

## 2020-05-25 NOTE — Assessment & Plan Note (Signed)
Shortness of breath Cough Fatigue:  Walked in office today O2 sats remained above 97% for entire walk and heart rate was stable  Stay active  Stay well hydrated  May start vitamin C, Zinc, and vitamin D3  May take mucinex DM twice daily  Deep breathing exercises  Will order azithromycin  Will order tessalon perles  Will order prednisone  May continue albuterol inhaler as needed   Follow up:  Follow up in 2 weeks - will need repeat labs and chest x ray

## 2020-05-25 NOTE — Patient Instructions (Addendum)
Covid Pneumonia Shortness of breath Cough Fatigue:  Walked in office today O2 sats remained above 97% for entire walk and heart rate was stable  Stay active  Stay well hydrated  May start vitamin C, Zinc, and vitamin D3  May take mucinex DM twice daily  Deep breathing exercises  Will order azithromycin  Will order tessalon perles  Will order prednisone  May continue albuterol inhaler as needed   Follow up:  Follow up in 2 weeks - will need repeat labs and chest x ray

## 2020-05-27 ENCOUNTER — Telehealth: Payer: Self-pay

## 2020-05-27 NOTE — Telephone Encounter (Signed)
Patient calls nurse line regarding FMLA paperwork and returning to work post COVID pneumonia. Patient requested virtual visit to discuss returning to work. Scheduled with ATC Tuesday 8/24 morning. If you would prefer her to be seen in person please let me know and I will call her to reschedule in [facility] clinic.   To PCP and Dr. Chrisandra Carota, RN

## 2020-05-28 NOTE — Telephone Encounter (Signed)
Appt changed to ATC with Dr. Dareen Piano for 8/25 by H. Pipkin, RN.  Jone Baseman, CMA

## 2020-06-01 ENCOUNTER — Telehealth: Payer: 59

## 2020-06-02 ENCOUNTER — Ambulatory Visit (INDEPENDENT_AMBULATORY_CARE_PROVIDER_SITE_OTHER): Payer: 59 | Admitting: Student in an Organized Health Care Education/Training Program

## 2020-06-02 ENCOUNTER — Other Ambulatory Visit: Payer: Self-pay

## 2020-06-02 DIAGNOSIS — B342 Coronavirus infection, unspecified: Secondary | ICD-10-CM | POA: Diagnosis not present

## 2020-06-02 DIAGNOSIS — Z7689 Persons encountering health services in other specified circumstances: Secondary | ICD-10-CM | POA: Diagnosis not present

## 2020-06-02 NOTE — Progress Notes (Signed)
   SUBJECTIVE:   CHIEF COMPLAINT / HPI: COVID f/u  Diagnosed 8/4 with COVID. Has now been asymptomatic mostly for about 10 days with the exception of a mild dry cough. Has been released by [employer] from quarantine from COVID. But she is still out of work for subsequent pneumonia- she has completed her antibiotics. 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 Patient is wondering when it will be ok for her to go back to work.  OBJECTIVE:   BP 110/80   Pulse 78   SpO2 97%   General: NAD, pleasant, able to participate in exam Cardiac: RRR, normal heart sounds, no murmurs. 2+ radial and PT pulses bilaterally Respiratory: CTAB, normal effort, No wheezes, rales or rhonchi Extremities: no edema or cyanosis. WWP. Skin: warm and dry, no rashes noted Neuro: alert and oriented x4, no focal deficits Psych: Normal affect and mood  ASSESSMENT/PLAN:   Return to work exam Patient is cleared by [employer] from COVID quarentine but continues to follow with covid clinic after infusion x1.  Patient feels much improved but continues to have difficulties with strenuous activity.  Based on her normal physical exam, normal vital signs I would say that she is physically fit to go back to work.  Do not recommend repeat x-rays at this time. -Recommend patient follow-up with Covid clinic prior to returning to work -Attempt to increase activity level as tolerated at home to gauge her tolerance for working a normal shift -With the home assessment on her activity level and rest needs, requested patient let me know which she thinks she is capable of doing so that I can assist her with documentation for her workplace.      Leeroy Bock, DO [facility]

## 2020-06-02 NOTE — Patient Instructions (Signed)
It was a pleasure to see you today!  To summarize our discussion for this visit:  I apologize for the delay in your paperwork. I will get that back this week. You seem to be doing well. Please continue to follow up with the COVID clinic and [employer] for when to return to work.   I would hold off on the vaccine until 60-90 days after your infusion. I will get back to you with the specific time period delay once I have looked into it further.   Call the clinic at [phone] if your symptoms worsen or you have any concerns.   Thank you for allowing me to take part in your care,  Dr. Jamelle Rushing

## 2020-06-07 DIAGNOSIS — Z7689 Persons encountering health services in other specified circumstances: Secondary | ICD-10-CM | POA: Insufficient documentation

## 2020-06-07 NOTE — Assessment & Plan Note (Addendum)
Patient is cleared by [facility] from COVID quarentine but continues to follow with covid clinic after infusion x1.  Patient feels much improved but continues to have difficulties with strenuous activity.  Based on her normal physical exam, normal vital signs I would say that she is physically fit to go back to work.  Do not recommend repeat x-rays at this time. -Recommend patient follow-up with Covid clinic prior to returning to work -Attempt to increase activity level as tolerated at home to gauge her tolerance for working a normal shift -With the home assessment on her activity level and rest needs, requested patient let me know which she thinks she is capable of doing so that I can assist her with documentation for her workplace.

## 2020-06-08 ENCOUNTER — Telehealth: Payer: Self-pay | Admitting: Nurse Practitioner

## 2020-06-08 ENCOUNTER — Ambulatory Visit (INDEPENDENT_AMBULATORY_CARE_PROVIDER_SITE_OTHER): Payer: 59 | Admitting: Nurse Practitioner

## 2020-06-08 ENCOUNTER — Other Ambulatory Visit: Payer: Self-pay

## 2020-06-08 ENCOUNTER — Ambulatory Visit
Admission: RE | Admit: 2020-06-08 | Discharge: 2020-06-08 | Disposition: A | Discharge status: Home / Self Care | Payer: 59 | Source: Ambulatory Visit | Attending: Nurse Practitioner | Admitting: Nurse Practitioner

## 2020-06-08 VITALS — BP 132/88 | HR 75 | Temp 97.7°F | Wt 209.0 lb

## 2020-06-08 DIAGNOSIS — R059 Cough, unspecified: Secondary | ICD-10-CM | POA: Insufficient documentation

## 2020-06-08 DIAGNOSIS — R05 Cough: Secondary | ICD-10-CM | POA: Diagnosis not present

## 2020-06-08 DIAGNOSIS — R600 Localized edema: Secondary | ICD-10-CM | POA: Diagnosis not present

## 2020-06-08 DIAGNOSIS — Z8616 Personal history of COVID-19: Secondary | ICD-10-CM | POA: Insufficient documentation

## 2020-06-08 NOTE — Telephone Encounter (Signed)
-----   Message from Ivonne Andrew, NP sent at 06/08/2020  1:30 PM EDT ----- Please call to let patient know that her chest x ray was clear. Heart looked normal on x ray. Thanks.

## 2020-06-08 NOTE — Patient Instructions (Signed)
History of Covid Pneumonia Shortness of breath Cough Fatigue:   Stay active  Stay well hydrated  May start vitamin C, Zinc, and vitamin D3  May take mucinex DM twice daily  Deep breathing exercises  May take delsym at night for cough as needed  May continue albuterol inhaler as needed  Will check labs   Will check chest x ray  Edema:  Elevate legs  Please discuss with PCP about adjusting dose of fluid medication  Wear compression hose  Low sodium diet   Edema  Edema is when you have too much fluid in your body or under your skin. Edema may make your legs, feet, and ankles swell up. Swelling is also common in looser tissues, like around your eyes. This is a common condition. It gets more common as you get older. There are many possible causes of edema. Eating too much salt (sodium) and being on your feet or sitting for a long time can cause edema in your legs, feet, and ankles. Hot weather may make edema worse. Edema is usually painless. Your skin may look swollen or shiny. Follow these instructions at home:  Keep the swollen body part raised (elevated) above the level of your heart when you are sitting or lying down.  Do not sit still or stand for a long time.  Do not wear tight clothes. Do not wear garters on your upper legs.  Exercise your legs. This can help the swelling go down.  Wear elastic bandages or support stockings as told by your doctor.  Eat a low-salt (low-sodium) diet to reduce fluid as told by your doctor.  Depending on the cause of your swelling, you may need to limit how much fluid you drink (fluid restriction).  Take over-the-counter and prescription medicines only as told by your doctor. Contact a doctor if:  Treatment is not working.  You have heart, liver, or kidney disease and have symptoms of edema.  You have sudden and unexplained weight gain. Get help right away if:  You have shortness of breath or chest pain.  You  cannot breathe when you lie down.  You have pain, redness, or warmth in the swollen areas.  You have heart, liver, or kidney disease and get edema all of a sudden.  You have a fever and your symptoms get worse all of a sudden. Summary  Edema is when you have too much fluid in your body or under your skin.  Edema may make your legs, feet, and ankles swell up. Swelling is also common in looser tissues, like around your eyes.  Raise (elevate) the swollen body part above the level of your heart when you are sitting or lying down.  Follow your doctor's instructions about diet and how much fluid you can drink (fluid restriction). This information is not intended to replace advice given to you by your health care provider. Make sure you discuss any questions you have with your health care provider. Document Revised: 09/28/2017 Document Reviewed: 10/13/2016 Elsevier Patient Education  2020 [employer]    Follow up:  Follow up as needed

## 2020-06-08 NOTE — Progress Notes (Signed)
_0  ID: Christine Cervantes, female    DOB: 07-22-1986, 34 y.o.   MRN: [id number]  Chief Complaint  Patient presents with  . Follow-up    Swelling in ankle, cough up phlegm    Referring provider: Richarda Osmond, DO   34 year old female with history of HTN and anemia. Diagnosed with Covid August 2021.   HPI  Patient presents today for post COVID care clinic visit follow-up.  Patient states that overall she is much improved.  She does state over the past couple weeks she has had increase edema to her lower extremities.  She also has a slight cough still at nighttime.  Patient does need follow-up chest x-ray and lab work done today.  We will add a BNP to that lab work.  Patient does currently take HCTZ.  Discussed with patient that she needs to speak with her primary care physician about lower extremity edema and if she needs to have a dose adjustment in her medications. Denies f/c/s, n/v/d, hemoptysis, PND, chest pain or edema.      Allergies  Allergen Reactions  . Latex Swelling and Rash    vaginally    Immunization History  Administered Date(s) Administered  . Influenza Split 01/03/2013  . Influenza Whole 07/16/2008  . Influenza,inj,Quad PF,6+ Mos 08/04/2019  . Influenza-Unspecified 07/09/2018  . MMR 01/30/2017  . PPD Test 01/30/2017, 05/19/2019  . Tdap 01/03/2013, 02/10/2016    Past Medical History:  Diagnosis Date  . Anemia   . Depression   . Headache(784.0)   . Pregnancy induced hypertension    no meds  . Vaginal Pap smear, abnormal     Tobacco History: Social History   Tobacco Use  Smoking Status Never Smoker  Smokeless Tobacco Never Used   Counseling given: Not Answered   Outpatient Encounter Medications as of 06/08/2020  Medication Sig  . acetaminophen (TYLENOL) 325 MG tablet Take 650 mg by mouth every 6 (six) hours as needed for mild pain or headache.  . Ascorbic Acid (VITAMIN C PO) Take 1 tablet by mouth daily.  . benzonatate (TESSALON) 100  MG capsule Take 1 capsule (100 mg total) by mouth 2 (two) times daily as needed for cough.  . ferrous sulfate 325 (65 FE) MG tablet Take 1 tablet (325 mg total) by mouth daily.  . hydrochlorothiazide (HYDRODIURIL) 12.5 MG tablet Take 1 tablet by mouth once daily  . Menthol-Methyl Salicylate (MUSCLE RUB) 10-15 % CREA Apply 1 application topically as needed for muscle pain.  Marland Kitchen ondansetron (ZOFRAN ODT) 4 MG disintegrating tablet Take 1 tablet (4 mg total) by mouth every 8 (eight) hours as needed for nausea or vomiting. (Patient not taking: Reported on 06/08/2020)  . VITAMIN E PO Take 1 tablet by mouth daily. (Patient not taking: Reported on 06/08/2020)  . [DISCONTINUED] azithromycin (ZITHROMAX) 250 MG tablet Take 2 tablets (500 mg) on day 1, then take 1 tablet (250 mg) on days 2-5  . [DISCONTINUED] Ferrous Sulfate (IRON PO) Take by mouth daily. Liquid OTC iron not sure of dose   No facility-administered encounter medications on file as of 06/08/2020.     Review of Systems  Review of Systems  Constitutional: Negative.  Negative for chills, fatigue and fever.  HENT: Negative.   Respiratory: Positive for cough and shortness of breath (with exertion, much improved).   Cardiovascular: Positive for leg swelling. Negative for chest pain and palpitations.  Gastrointestinal: Negative.   Allergic/Immunologic: Negative.   Neurological: Negative.   Psychiatric/Behavioral: Negative.  Physical Exam  BP 132/88 (BP Location: Left Arm)   Pulse 75   Temp 97.7 F (36.5 C)   Wt 209 lb (94.8 kg)   SpO2 98%   BMI 35.87 kg/m   Wt Readings from Last 5 Encounters:  06/08/20 209 lb (94.8 kg)  05/25/20 201 lb 0.1 oz (91.2 kg)  05/16/20 203 lb (92.1 kg)  01/28/20 216 lb (98 kg)  01/23/20 201 lb 12.8 oz (91.5 kg)     Physical Exam Vitals and nursing note reviewed.  Constitutional:      General: She is not in acute distress.    Appearance: She is well-developed.  Cardiovascular:     Rate and  Rhythm: Normal rate and regular rhythm.  Pulmonary:     Effort: Pulmonary effort is normal.     Breath sounds: Normal breath sounds.  Musculoskeletal:     Right lower leg: 1+ Edema present.     Left lower leg: 1+ Edema present.  Neurological:     Mental Status: She is alert and oriented to person, place, and time.     Imaging: DG Chest 2 View  Result Date: 05/11/2020 CLINICAL DATA:  Chest pain and shortness of breath. EXAM: CHEST - 2 VIEW COMPARISON:  January 23, 2020 FINDINGS: The heart size and mediastinal contours are within normal limits. Both lungs are clear. Bilateral radiopaque nipple piercings are noted. The visualized skeletal structures are unremarkable. IMPRESSION: No active cardiopulmonary disease. Electronically Signed   By: Virgina Norfolk M.D.   On: 05/11/2020 17:54   DG Chest Portable 1 View  Result Date: 05/16/2020 CLINICAL DATA:  Shortness of breath. Diagnosed with Covid last week. Weakness, muscle aches, nausea, vomiting. EXAM: PORTABLE CHEST 1 VIEW COMPARISON:  05/11/2020 FINDINGS: Shallow lung inflation. Low lung volumes accentuate bronchovascular markings. There is patchy opacity at the MEDIAL LEFT lung base and LATERAL RIGHT lung base. Minimal subsegmental atelectasis at the LATERAL LEFT lung base. IMPRESSION: Bibasilar opacities, consistent with infectious infiltrates. Electronically Signed   By: Nolon Nations M.D.   On: 05/16/2020 16:22     Assessment & Plan:   History of COVID-19 Shortness of breath Cough Fatigue:   Stay active  Stay well hydrated  May start vitamin C, Zinc, and vitamin D3  May take mucinex DM twice daily  Deep breathing exercises  May take delsym at night for cough as needed  May continue albuterol inhaler as needed  Will check labs   Will check chest x ray  Edema:  Elevate legs  Please discuss with PCP about adjusting dose of fluid medication  Wear compression hose  Low sodium diet      Patient  Instructions  History of Covid Pneumonia Shortness of breath Cough Fatigue:   Stay active  Stay well hydrated  May start vitamin C, Zinc, and vitamin D3  May take mucinex DM twice daily  Deep breathing exercises  May take delsym at night for cough as needed  May continue albuterol inhaler as needed  Will check labs   Will check chest x ray  Edema:  Elevate legs  Please discuss with PCP about adjusting dose of fluid medication  Wear compression hose  Low sodium diet   Edema  Edema is when you have too much fluid in your body or under your skin. Edema may make your legs, feet, and ankles swell up. Swelling is also common in looser tissues, like around your eyes. This is a common condition. It gets more common as you get  older. There are many possible causes of edema. Eating too much salt (sodium) and being on your feet or sitting for a long time can cause edema in your legs, feet, and ankles. Hot weather may make edema worse. Edema is usually painless. Your skin may look swollen or shiny. Follow these instructions at home:  Keep the swollen body part raised (elevated) above the level of your heart when you are sitting or lying down.  Do not sit still or stand for a long time.  Do not wear tight clothes. Do not wear garters on your upper legs.  Exercise your legs. This can help the swelling go down.  Wear elastic bandages or support stockings as told by your doctor.  Eat a low-salt (low-sodium) diet to reduce fluid as told by your doctor.  Depending on the cause of your swelling, you may need to limit how much fluid you drink (fluid restriction).  Take over-the-counter and prescription medicines only as told by your doctor. Contact a doctor if:  Treatment is not working.  You have heart, liver, or kidney disease and have symptoms of edema.  You have sudden and unexplained weight gain. Get help right away if:  You have shortness of breath or chest  pain.  You cannot breathe when you lie down.  You have pain, redness, or warmth in the swollen areas.  You have heart, liver, or kidney disease and get edema all of a sudden.  You have a fever and your symptoms get worse all of a sudden. Summary  Edema is when you have too much fluid in your body or under your skin.  Edema may make your legs, feet, and ankles swell up. Swelling is also common in looser tissues, like around your eyes.  Raise (elevate) the swollen body part above the level of your heart when you are sitting or lying down.  Follow your doctor's instructions about diet and how much fluid you can drink (fluid restriction). This information is not intended to replace advice given to you by your health care provider. Make sure you discuss any questions you have with your health care provider. Document Revised: 09/28/2017 Document Reviewed: 10/13/2016 Elsevier Patient Education  2020 [place].    Follow up:  Follow up as needed     Fenton Foy, NP 06/08/2020

## 2020-06-08 NOTE — Telephone Encounter (Signed)
Results given to patient. Verbally understood.No additional questions or concerns.

## 2020-06-08 NOTE — Assessment & Plan Note (Signed)
Shortness of breath Cough Fatigue:   Stay active  Stay well hydrated  May start vitamin C, Zinc, and vitamin D3  May take mucinex DM twice daily  Deep breathing exercises  May take delsym at night for cough as needed  May continue albuterol inhaler as needed  Will check labs   Will check chest x ray  Edema:  Elevate legs  Please discuss with PCP about adjusting dose of fluid medication  Wear compression hose  Low sodium diet

## 2020-06-09 ENCOUNTER — Encounter: Payer: Self-pay | Admitting: Student in an Organized Health Care Education/Training Program

## 2020-06-09 ENCOUNTER — Telehealth: Payer: Self-pay | Admitting: Nurse Practitioner

## 2020-06-09 LAB — COMPREHENSIVE METABOLIC PANEL
ALT: 8 IU/L (ref 0–32)
AST: 11 IU/L (ref 0–40)
Albumin/Globulin Ratio: 1.3 (ref 1.2–2.2)
Albumin: 3.6 g/dL — ABNORMAL LOW (ref 3.8–4.8)
Alkaline Phosphatase: 56 IU/L (ref 48–121)
BUN/Creatinine Ratio: 7 — ABNORMAL LOW (ref 9–23)
BUN: 5 mg/dL — ABNORMAL LOW (ref 6–20)
Bilirubin Total: 0.2 mg/dL (ref 0.0–1.2)
CO2: 21 mmol/L (ref 20–29)
Calcium: 8.6 mg/dL — ABNORMAL LOW (ref 8.7–10.2)
Chloride: 107 mmol/L — ABNORMAL HIGH (ref 96–106)
Creatinine, Ser: 0.68 mg/dL (ref 0.57–1.00)
GFR calc Af Amer: 130 mL/min/{1.73_m2} (ref >59)
GFR calc non Af Amer: 113 mL/min/{1.73_m2} (ref >59)
Globulin, Total: 2.8 g/dL (ref 1.5–4.5)
Glucose: 78 mg/dL (ref 65–99)
Potassium: 4.4 mmol/L (ref 3.5–5.2)
Sodium: 139 mmol/L (ref 134–144)
Total Protein: 6.4 g/dL (ref 6.0–8.5)

## 2020-06-09 LAB — CBC
Hematocrit: 33.7 % — ABNORMAL LOW (ref 34.0–46.6)
Hemoglobin: 10.6 g/dL — ABNORMAL LOW (ref 11.1–15.9)
MCH: 26 pg — ABNORMAL LOW (ref 26.6–33.0)
MCHC: 31.5 g/dL (ref 31.5–35.7)
MCV: 83 fL (ref 79–97)
Platelets: 193 10*3/uL (ref 150–450)
RBC: 4.07 x10E6/uL (ref 3.77–5.28)
RDW: 14.6 % (ref 11.7–15.4)
WBC: 5.1 10*3/uL (ref 3.4–10.8)

## 2020-06-09 LAB — BRAIN NATRIURETIC PEPTIDE: BNP: 40.3 pg/mL (ref 0.0–100.0)

## 2020-06-09 NOTE — Telephone Encounter (Signed)
Attempted to contact patient, Voicemail full unable to leave message.

## 2020-06-09 NOTE — Telephone Encounter (Signed)
-----   Message from Ivonne Andrew, NP sent at 06/09/2020  2:59 PM EDT ----- Please call to let patient know that she is slightly anemic and kidney function is off. Please stay well hydrated. Please discuss low hgb level at upcoming visit with PCP next week. PCP may need to do additional blood work.

## 2020-06-10 DIAGNOSIS — Z111 Encounter for screening for respiratory tuberculosis: Secondary | ICD-10-CM | POA: Diagnosis not present

## 2020-06-11 NOTE — Telephone Encounter (Signed)
Results given. Patient verbally understood. No additional questions.

## 2020-06-15 ENCOUNTER — Other Ambulatory Visit: Payer: Self-pay

## 2020-06-15 ENCOUNTER — Ambulatory Visit (INDEPENDENT_AMBULATORY_CARE_PROVIDER_SITE_OTHER): Payer: 59 | Admitting: Family Medicine

## 2020-06-15 VITALS — BP 110/64 | HR 94 | Ht 64.0 in | Wt 206.5 lb

## 2020-06-15 DIAGNOSIS — R600 Localized edema: Secondary | ICD-10-CM | POA: Diagnosis not present

## 2020-06-15 DIAGNOSIS — Z23 Encounter for immunization: Secondary | ICD-10-CM

## 2020-06-15 NOTE — Progress Notes (Signed)
    SUBJECTIVE:   CHIEF COMPLAINT / HPI: ankle swelling  Patient recently tested positive for COVID 08/04. Received  MAB infusion 08/09. She  Reports that since she had COVID she has noticed her ankles swelling. She is out of her isolation period and symptom free. She is followed by [facility] at work who told her she needed to see her PCP for ankle swelling to be cleared for work.  Patient works as a Psychologist, sport and exercise at [employer].  She reports that she normally wears compression stockings but has not done so since she has been off work.  She reports that the swelling improves when legs are elevated.  Denies any worsening SOB, chest pain or leg calf swelling or pain.    PERTINENT  PMH / PSH:  COVID pneumonia  OBJECTIVE:   BP 110/64   Pulse 94   Ht 5\' 4"  (1.626 m)   Wt 206 lb 8 oz (93.7 kg)   LMP 05/24/2020   SpO2 98%   BMI 35.45 kg/m     General: Alert and oriented, no apparent distress  Cardiovascular: RRR with no murmurs noted, no lower extremity edema appreciated, distal pulses present bilaterally Respiratory: CTA bilaterally, good A/E   Gastrointestinal: Bowel sounds present. No abdominal pain   ASSESSMENT/PLAN:   Bilateral lower extremity edema No edema appreciated on exam today.  Recent labs and chest xray 08/31 not suggestive of CHF and normal CVs/Resp exam today.  Considered DVT but no edema or pain in calf and given that edema is bilaterally seems less likely.  Given that edema resolves when legs elevated likely related to venous insuffiencey.  I do not think increasing HCTZ or adding Lasix will resolve this.   -Advise to continue to compression stockings daily -Discussed weight loss to help decrease edema -Continue to remain active -Follow up with PCP as needed or if symptoms worsen     9/31, MD [facility]

## 2020-06-15 NOTE — Patient Instructions (Signed)
Thank you for coming to see me today. It was a pleasure.   Please uses your compression stockings while at home and at work. Elevate legs as much as possible.  If continues to persist please follow up with PCP.  Avoid NSAID containing products, Ibuprofen, Advil, Diclofenac, Goodie powder  If you develop any chest pain, worsening shortness of breath contact your PCP as soon as possible or go to Urgent Care or Emergency department.  Please follow-up with PCP in 2-3 weeks  If you have any questions or concerns, please do not hesitate to call the office at [phone].  Best,   Dana Allan, MD Family Medicine Residency     Chronic Venous Insufficiency Chronic venous insufficiency is a condition where the leg veins cannot effectively pump blood from the legs to the heart. This happens when the vein walls are either stretched, weakened, or damaged, or when the valves inside the vein are damaged. With the right treatment, you should be able to continue with an active life. This condition is also called venous stasis. What are the causes? Common causes of this condition include:  High blood pressure inside the veins (venous hypertension).  Sitting or standing too long, causing increased blood pressure in the leg veins.  A blood clot that blocks blood flow in a vein (deep vein thrombosis, DVT).  Inflammation of a vein (phlebitis) that causes a blood clot to form.  Tumors in the pelvis that cause blood to back up. What increases the risk? The following factors may make you more likely to develop this condition:  Having a family history of this condition.  Obesity.  Pregnancy.  Living without enough regular physical activity or exercise (sedentary lifestyle).  Smoking.  Having a job that requires long periods of standing or sitting in one place.  Being a certain age. Women in their 35s and 51s and men in their 44s are more likely to develop this condition. What are the signs  or symptoms? Symptoms of this condition include:  Veins that are enlarged, bulging, or twisted (varicose veins).  Skin breakdown or ulcers.  Reddened skin or dark discoloration of skin on the leg between the knee and ankle.  Brown, smooth, tight, and painful skin just above the ankle, usually on the inside of the leg (lipodermatosclerosis).  Swelling of the legs. How is this diagnosed? This condition may be diagnosed based on:  Your medical history.  A physical exam.  Tests, such as: ? A procedure that creates an image of a blood vessel and nearby organs and provides information about blood flow through the blood vessel (duplex ultrasound). ? A procedure that tests blood flow (plethysmography). ? A procedure that looks at the veins using X-ray and dye (venogram). How is this treated? The goals of treatment are to help you return to an active life and to minimize pain or disability. Treatment depends on the severity of your condition, and it may include:  Wearing compression stockings. These can help relieve symptoms and help prevent your condition from getting worse. However, they do not cure the condition.  Sclerotherapy. This procedure involves an injection of a solution that shrinks damaged veins.  Surgery. This may involve: ? Removing a diseased vein (vein stripping). ? Cutting off blood flow through the vein (laser ablation surgery). ? Repairing or reconstructing a valve within the affected vein. Follow these instructions at home:      Wear compression stockings as told by your health care provider. These stockings help  to prevent blood clots and reduce swelling in your legs.  Take over-the-counter and prescription medicines only as told by your health care provider.  Stay active by exercising, walking, or doing different activities. Ask your health care provider what activities are safe for you and how much exercise you need.  Drink enough fluid to keep your urine  pale yellow.  Do not use any products that contain nicotine or tobacco, such as cigarettes, e-cigarettes, and chewing tobacco. If you need help quitting, ask your health care provider.  Keep all follow-up visits as told by your health care provider. This is important. Contact a health care provider if you:  Have redness, swelling, or more pain in the affected area.  See a red streak or line that goes up or down from the affected area.  Have skin breakdown or skin loss in the affected area, even if the breakdown is small.  Get an injury in the affected area. Get help right away if:  You get an injury and an open wound in the affected area.  You have: ? Severe pain that does not get better with medicine. ? Sudden numbness or weakness in the foot or ankle below the affected area. ? Trouble moving your foot or ankle. ? A fever. ? Worse or persistent symptoms. ? Chest pain. ? Shortness of breath. Summary  Chronic venous insufficiency is a condition where the leg veins cannot effectively pump blood from the legs to the heart.  Chronic venous insufficiency occurs when the vein walls become stretched, weakened, or damaged, or when valves within the vein are damaged.  Treatment depends on how severe your condition is. It often involves wearing compression stockings and may involve having a procedure.  Make sure you stay active by exercising, walking, or doing different activities. Ask your health care provider what activities are safe for you and how much exercise you need. This information is not intended to replace advice given to you by your health care provider. Make sure you discuss any questions you have with your health care provider. Document Revised: 06/18/2018 Document Reviewed: 06/18/2018 Elsevier Patient Education  2020 [employer].

## 2020-06-16 ENCOUNTER — Encounter: Payer: Self-pay | Admitting: Family Medicine

## 2020-06-16 NOTE — Assessment & Plan Note (Addendum)
No edema appreciated on exam today.  Recent labs and chest xray 08/31 not suggestive of CHF and normal CVs/Resp exam today.  Considered DVT but no edema or pain in calf and given that edema is bilaterally seems less likely.  Given that edema resolves when legs elevated likely related to venous insuffiencey.  I do not think increasing HCTZ or adding Lasix will resolve this.   -Advise to continue to compression stockings daily -Discussed weight loss to help decrease edema -Continue to remain active -Follow up with PCP as needed or if symptoms worsen

## 2020-06-18 NOTE — Telephone Encounter (Signed)
Patient is calling back to check on the status of fmla paper work being completed and faxed to [employer]. Patient said this is the second time she has been denied her FMLA due to the form not being sent to [employer] by the dead line. She would like for this to be done as soon as possible.   Please call patient when this form is completed and faxed.  The best call back is [phone].

## 2020-06-18 NOTE — Telephone Encounter (Signed)
FMLA has been completed by Dr. Dareen Piano, faxed back to [phone] and a copy made for batch scanning.  Pt has requested to pick up the original.  Placed at the front for pickup.  Pt informed and appreciative.  Jone Baseman, CMA

## 2020-07-02 ENCOUNTER — Telehealth: Payer: Self-pay | Admitting: Family Medicine

## 2020-07-02 NOTE — Telephone Encounter (Signed)
Covid-19 Vaccination Exemption / Nurse, learning disability form dropped off for at front desk for completion.  Verified that patient section of form has been completed.  Last DOS/WCC with PCP was 06/15/20.  Placed form in team folder to be completed by clinical staff.  Vilinda Blanks

## 2020-07-05 NOTE — Telephone Encounter (Signed)
Clinical info not completed on Vaccination Exemption form. I am not sure if our office is the appropriate office to fill the form out.  Placed form in Dr. Claris Che box for completion.  Christine Cervantes, CMA

## 2020-07-06 ENCOUNTER — Encounter: Payer: Self-pay | Admitting: Family Medicine

## 2020-07-07 NOTE — Telephone Encounter (Signed)
Received form from PCP. PCP has completed required documentation. Last page of paperwork is to be completed by [facility] Physician Review committee.   Will place copy in batch scanning, original will be placed up front.  Veronda Prude, RN

## 2020-07-07 NOTE — Telephone Encounter (Signed)
Form was in RN box this morning. It has not been signed by PCP, unsure if this is a mistake, or PCP is not signing it. I placed back in PCP box.

## 2020-07-07 NOTE — Telephone Encounter (Signed)
Form is competed by me, needs to be sent to physician on [facility] health committee for approval.  Thanks

## 2020-07-10 DIAGNOSIS — Z111 Encounter for screening for respiratory tuberculosis: Secondary | ICD-10-CM | POA: Diagnosis not present

## 2020-08-04 ENCOUNTER — Telehealth: Payer: Self-pay | Admitting: Family Medicine

## 2020-08-04 NOTE — Telephone Encounter (Signed)
Patient would like to know if Dr. Clent Ridges could right a letter stating that she had her flu shot at her last office visit 06/15/20.   She works for [employer] and needs proof that she had it here. Pt would like to have this sent to her in her mychart.

## 2020-08-04 NOTE — Telephone Encounter (Signed)
Called patient to discuss disability forms.  She will call clinic in am to book an appointment with me to further discuss.  She also need a letter stating that she received her flu vaccine from [facility] clinic on 06/15/20  Will complete letter and send through MyChart.  Dana Allan, MD Family Medicine Residency

## 2020-09-21 ENCOUNTER — Ambulatory Visit (INDEPENDENT_AMBULATORY_CARE_PROVIDER_SITE_OTHER): Payer: 59

## 2020-09-21 ENCOUNTER — Other Ambulatory Visit: Payer: Self-pay

## 2020-09-21 DIAGNOSIS — Z23 Encounter for immunization: Secondary | ICD-10-CM

## 2020-09-21 NOTE — Progress Notes (Signed)
   Covid-19 Vaccination Clinic  Name:  Lyndsee Casa    MRN: [id number] DOB: [birth year]  09/21/2020  Ms. Andersson was observed post Covid-19 immunization for 15 minutes without incident. She was provided with Vaccine Information Sheet and instruction to access the V-Safe system.   Ms. Kohli was instructed to call 911 with any severe reactions post vaccine: Marland Kitchen Difficulty breathing  . Swelling of face and throat  . A fast heartbeat  . A bad rash all over body  . Dizziness and weakness   #1 Covid Vaccine administered RD without complication.  #2 Covid Vaccine due 10/12/2020.

## 2020-09-29 DIAGNOSIS — I8312 Varicose veins of left lower extremity with inflammation: Secondary | ICD-10-CM | POA: Diagnosis not present

## 2020-10-22 ENCOUNTER — Ambulatory Visit: Payer: 59

## 2020-10-25 ENCOUNTER — Ambulatory Visit: Payer: 59

## 2020-11-19 ENCOUNTER — Telehealth: Payer: Self-pay | Admitting: Family Medicine

## 2020-11-19 NOTE — Telephone Encounter (Signed)
Patient is calling to speak to the doctor about 2 referrals offered her appointment she denied due to wanting to avoid paying a copay. So she is wanting the doctor to call her. Please advise. Thanks!

## 2020-11-22 NOTE — Telephone Encounter (Signed)
Please call patient and let her know that I will call her tomorrow evening after clinic.  Thanks Kenney Houseman

## 2020-11-23 ENCOUNTER — Other Ambulatory Visit: Payer: Self-pay | Admitting: Family Medicine

## 2020-11-23 ENCOUNTER — Telehealth: Payer: Self-pay | Admitting: Family Medicine

## 2020-11-23 DIAGNOSIS — R5383 Other fatigue: Secondary | ICD-10-CM

## 2020-11-23 NOTE — Progress Notes (Signed)
Returned patients call regarding referrals.  Patient reports ongoing fatigue, snoring and lack of energy.  She would like to find out what is causing this as she is about to start a new position at work. I will see her in clinic on Mar 1 at 130 pm.  She is aware that this is a double booking and is willing to wait if necessary.  I will order labs today and she will make appointment to have them done before seeing me.   -TSH, CBC, CMP, HIV and Vit D future order -Consider sleep studies -Will need PHQ9, denies any SI or depressed mood today -Will have staff book appointment for March 1 at 1330.  Dana Allan, MD Family Medicine Residency

## 2020-11-24 ENCOUNTER — Telehealth: Payer: Self-pay | Admitting: Family Medicine

## 2020-11-24 NOTE — Telephone Encounter (Signed)
**  After Hours/ Emergency Line Call**  Received a call to report that Christine Cervantes .  Endorsing out of HCTZ for about 1 week and not having some headaches and feeling like some fluid over.  Unable to check BP now but states that she has had similar symptoms when BP is elevated. Denying any chest pain, shortness and breath.  Recommended to take an extra dose of HCTZ now.  Red flags discussed.  Scheduled an appointment to be seen in clinic on Fri for labs (previously ordered) and BP follow up.  Dana Allan MD PGY-2, [facility] 11/24/2020 7:07 PM

## 2020-11-24 NOTE — Telephone Encounter (Signed)
Dr. Clent Ridges called pt and schedule an appt for pt to be seen in clinic. Sunday Spillers, CMA

## 2020-11-25 NOTE — Progress Notes (Signed)
    SUBJECTIVE:   CHIEF COMPLAINT / HPI:   BP check: Today her BP is 123/89. She is currently prescribed HCTZ 12.5mg  daily.   Daytime fatigue: Patient reports ongoing fatigue, snoring and lack of energy.  She reports she would like to figure out the cause of her lack of energy because she is starting a new position at work soon which requires her to be up much earlier in the day.  Patient with STOP-BANG score of 4 (patient's neck circumference 37 cm) indicating high risk of OSA. She does have a history of depression for which she used to be treated with citalopram, however patient weaned herself off this medication as it made her very sleepy. PHQ-9 score today is 9.  Patient reports she is interested in starting on a medication today (Prozac) and is interested in counseling.  Patient was provided with resources for the behavioral health center.    Hep C Screening: No concerns for hepatitis C, however will screen patient today  PERTINENT  PMH / PSH:  HTN, Hx PNA d/t COVID  OBJECTIVE:   BP 123/89   Pulse 86   Ht 5\' 4"  (1.626 m)   Wt 217 lb (98.4 kg)   SpO2 99%   BMI 37.25 kg/m    Physical exam: General: Well-appearing, somewhat fatigued Respiratory: CTA bilaterally, comfortable work of breathing Cardio: RRR, S1-S2 present, no murmurs appreciated Neck: No thyromegaly or goiter appreciated   ASSESSMENT/PLAN:   Essential hypertension BP today 123/89. -Patient to continue HCTZ 12.5 mg daily  Daytime sleepiness Patient with daytime sleepiness that is negatively affecting her lifestyle, inability to work. -Ordered TSH, CBC, CMP, HIV and Vit D -patient high risk for OSA, sleep study ordered -PHQ 9 score today is 9, could be contributing to patient's sleepiness, patient started on Prozac -Patient to follow-up with PCP on March 1 at 1330 or sooner as needed     1331, DO [facility]

## 2020-11-26 ENCOUNTER — Ambulatory Visit: Payer: 59 | Admitting: Family Medicine

## 2020-11-26 ENCOUNTER — Other Ambulatory Visit: Payer: Self-pay

## 2020-11-26 VITALS — BP 123/89 | HR 86 | Ht 64.0 in | Wt 217.0 lb

## 2020-11-26 DIAGNOSIS — R400 Somnolence: Secondary | ICD-10-CM | POA: Insufficient documentation

## 2020-11-26 DIAGNOSIS — Z1329 Encounter for screening for other suspected endocrine disorder: Secondary | ICD-10-CM

## 2020-11-26 DIAGNOSIS — Z1159 Encounter for screening for other viral diseases: Secondary | ICD-10-CM

## 2020-11-26 DIAGNOSIS — Z1321 Encounter for screening for nutritional disorder: Secondary | ICD-10-CM

## 2020-11-26 DIAGNOSIS — I10 Essential (primary) hypertension: Secondary | ICD-10-CM

## 2020-11-26 DIAGNOSIS — Z114 Encounter for screening for human immunodeficiency virus [HIV]: Secondary | ICD-10-CM | POA: Insufficient documentation

## 2020-11-26 MED ORDER — FLUOXETINE HCL 10 MG PO CAPS: 10.0000 mg | ORAL_CAPSULE | Freq: Every day | ORAL | 3 refills | Status: DC

## 2020-11-26 NOTE — Assessment & Plan Note (Signed)
BP today 123/89. -Patient to continue HCTZ 12.5 mg daily

## 2020-11-26 NOTE — Assessment & Plan Note (Signed)
Patient with daytime sleepiness that is negatively affecting her lifestyle, inability to work. -Ordered TSH, CBC, CMP, HIV and Vit D -patient high risk for OSA, sleep study ordered -PHQ 9 score today is 9, could be contributing to patient's sleepiness, patient started on Prozac -Patient to follow-up with PCP on March 1 at 1330 or sooner as needed

## 2020-11-26 NOTE — Patient Instructions (Addendum)
We are checking a lot of labs to evaluate for any fatigue. You have also been referred for a sleep study to evaluate for Obstructive Sleep Apnea.   We are starting you on Prozac 10mg  daily. Take this daily and know that you may not feel much of an effect for about 2 weeks. I have given you a flier for the Behavioral Health center. Please reach out to them.   Your appt with Dr. is March 1st at 1:30pm.   04-28-1988, DO [facility], PGY-3 11/26/2020 10:18 AM

## 2020-11-27 LAB — CBC WITH DIFFERENTIAL
Basophils Absolute: 0 10*3/uL (ref 0.0–0.2)
Basos: 0 %
EOS (ABSOLUTE): 0.1 10*3/uL (ref 0.0–0.4)
Eos: 2 %
Hematocrit: 39.1 % (ref 34.0–46.6)
Hemoglobin: 12.7 g/dL (ref 11.1–15.9)
Immature Grans (Abs): 0 10*3/uL (ref 0.0–0.1)
Immature Granulocytes: 0 %
Lymphocytes Absolute: 1.5 10*3/uL (ref 0.7–3.1)
Lymphs: 32 %
MCH: 26.3 pg — ABNORMAL LOW (ref 26.6–33.0)
MCHC: 32.5 g/dL (ref 31.5–35.7)
MCV: 81 fL (ref 79–97)
Monocytes Absolute: 0.5 10*3/uL (ref 0.1–0.9)
Monocytes: 10 %
Neutrophils Absolute: 2.6 10*3/uL (ref 1.4–7.0)
Neutrophils: 56 %
RBC: 4.83 x10E6/uL (ref 3.77–5.28)
RDW: 11.8 % (ref 11.7–15.4)
WBC: 4.6 10*3/uL (ref 3.4–10.8)

## 2020-11-27 LAB — COMPREHENSIVE METABOLIC PANEL
ALT: 13 IU/L (ref 0–32)
AST: 14 IU/L (ref 0–40)
Albumin/Globulin Ratio: 1.5 (ref 1.2–2.2)
Albumin: 4.3 g/dL (ref 3.8–4.8)
Alkaline Phosphatase: 50 IU/L (ref 44–121)
BUN/Creatinine Ratio: 14 (ref 9–23)
BUN: 11 mg/dL (ref 6–20)
Bilirubin Total: 0.4 mg/dL (ref 0.0–1.2)
CO2: 22 mmol/L (ref 20–29)
Calcium: 9.1 mg/dL (ref 8.7–10.2)
Chloride: 104 mmol/L (ref 96–106)
Creatinine, Ser: 0.77 mg/dL (ref 0.57–1.00)
GFR calc Af Amer: 115 mL/min/{1.73_m2} (ref >59)
GFR calc non Af Amer: 100 mL/min/{1.73_m2} (ref >59)
Globulin, Total: 2.9 g/dL (ref 1.5–4.5)
Glucose: 89 mg/dL (ref 65–99)
Potassium: 4.4 mmol/L (ref 3.5–5.2)
Sodium: 139 mmol/L (ref 134–144)
Total Protein: 7.2 g/dL (ref 6.0–8.5)

## 2020-11-27 LAB — VITAMIN D 25 HYDROXY (VIT D DEFICIENCY, FRACTURES): Vit D, 25-Hydroxy: 23.7 ng/mL — ABNORMAL LOW (ref 30.0–100.0)

## 2020-11-27 LAB — HIV ANTIBODY (ROUTINE TESTING W REFLEX): HIV Screen 4th Generation wRfx: NONREACTIVE

## 2020-11-27 LAB — HEPATITIS C ANTIBODY: Hep C Virus Ab: <0.1 s/co ratio (ref 0.0–0.9)

## 2020-11-27 LAB — TSH: TSH: 0.996 u[IU]/mL (ref 0.450–4.500)

## 2020-11-27 NOTE — Telephone Encounter (Signed)
err

## 2020-12-01 NOTE — Progress Notes (Signed)
    SUBJECTIVE:   CHIEF COMPLAINT / HPI:  Review labs   Fatigue Reports ongoing fatigue for a few months.  Excessive tiredness especially during the day.  Notes that she sleeps about 8 hours. Was working nights but has now switched to days.  Reports snoring and possible sleep apnea.  Labs reviewed for mild Vit D deficiency.    Recent labs significant for low Vit D    PERTINENT  PMH / PSH:  HTN Elevated BMI COVID positive 6 months ago OBJECTIVE:   BP (!) 129/106   Pulse 71   Ht 5\' 4"  (1.626 m)   Wt 214 lb 3.2 oz (97.2 kg)   LMP 12/01/2020   SpO2 99%   BMI 36.77 kg/m    General: Alert, no acute distress Cardio: Normal S1 and S2, RRR, no r/m/g Pulm: CTAB, normal work of breathing Abdomen: Bowel sounds normal. Abdomen soft and non-tender.    ASSESSMENT/PLAN:   Daytime sleepiness Labs not significant for metabolic causes for excessive fatigue.  Will obtain sleep studies Trial Vit D 50000 mcg qweekly x6/52 Encourage Sleep hygiene  Follow up 6-8 weeks     11-19-1993, MD [facility]

## 2020-12-01 NOTE — Patient Instructions (Addendum)
Thank you for coming to see me today. It was a pleasure.    Take Vitamin D one tablet every 7 days for 6 weeks  Will sent referral for sleep studies.  We will call you with an appointment.  Please follow-up with PCP 6-8 weeks  If you have any questions or concerns, please do not hesitate to call the office at [phone].  Best,   Dana Allan, MD    What can I do to improve my insomnia? -- You can follow good "sleep hygiene." That means that you: ?Sleep only long enough to feel rested and then get out of bed ?Go to bed and get up at the same time every day ?Do not try to force yourself to sleep. If you can't sleep, get out of bed and try again later. ?Have coffee, tea, and other foods that have caffeine only in the morning ?Avoid alcohol in the late afternoon, evening, and bedtime ?Avoid smoking, especially in the evening ?Keep your bedroom dark, cool, quiet, and free of reminders of work or other things that cause you stress ?Solve problems you have before you go to bed ?Exercise several days a week, but not right before bed ?Avoid looking at phones or reading devices ("e-books") that give off light before bed. This can make it harder to fall asleep. Other things that can improve sleep include: ?Relaxation therapy, in which you focus on relaxing all the muscles in your body 1 by 1 ?Working with a Conservator, museum/gallery to deal with the problems that might be causing poor sleep  Sleep hygiene guidelines Recommendation Details  Regular bedtime and rise time Having a consistent bedtime and rise time leads to more regular sleep schedules and avoids periods of sleep deprivation or periods of extended wakefulness during the night.  Avoid napping Avoid napping, especially naps lasting longer than 1 hour and naps late in the day.  Limit caffeine Avoid caffeine after lunch. The time between lunch and bedtime represents approximately 2 half-lives for caffeine, and this time window  allows for most caffeine to be metabolized before bedtime.  Limit alcohol Recommendations are typically focused on avoiding alcohol near bedtime. Alcohol is initially sedating, but activating as it is metabolized. Alcohol also negatively impacts sleep architecture.  Avoid nicotine Nicotine is a stimulant and should be avoided near bedtime and at night.  Exercise Daytime physical activity is encouraged, in particular, 4 to 6 hours before bedtime, as this may facilitate sleep onset. Rigorous exercise within 2 hours of bedtime is discouraged.  Keep the sleep environment quiet and dark Noise and light exposure during the night can disrupt sleep. White noise or ear plugs are often recommended to reduce noise. Using blackout shades or an eye mask is commonly recommended to reduce light. This may also include avoiding exposure to television or technology near bedtime, as this can have an impact on circadian rhythms by shifting sleep timing later.   Bedroom clock Avoid checking the time at night. This includes alarm clocks and other time pieces (eg, watches and smart phones). Checking the time increases cognitive arousal and prolongs wakefulness.  Evening eating Avoid a large meal near bedtime, but don't go to bed hungry. Eat a healthy and filling meal in the evening and avoid late-night snacks.

## 2020-12-07 ENCOUNTER — Ambulatory Visit (INDEPENDENT_AMBULATORY_CARE_PROVIDER_SITE_OTHER): Payer: 59 | Admitting: Family Medicine

## 2020-12-07 ENCOUNTER — Ambulatory Visit (INDEPENDENT_AMBULATORY_CARE_PROVIDER_SITE_OTHER): Payer: 59

## 2020-12-07 ENCOUNTER — Encounter: Payer: Self-pay | Admitting: Family Medicine

## 2020-12-07 ENCOUNTER — Other Ambulatory Visit: Payer: Self-pay

## 2020-12-07 VITALS — BP 129/106 | HR 71 | Ht 64.0 in | Wt 214.2 lb

## 2020-12-07 DIAGNOSIS — R400 Somnolence: Secondary | ICD-10-CM

## 2020-12-07 DIAGNOSIS — Z23 Encounter for immunization: Secondary | ICD-10-CM

## 2020-12-07 MED ORDER — VITAMIN D (ERGOCALCIFEROL) 1.25 MG (50000 UNIT) PO CAPS: 50000.0000 [IU] | ORAL_CAPSULE | ORAL | 0 refills | Status: DC

## 2020-12-09 ENCOUNTER — Encounter: Payer: Self-pay | Admitting: Family Medicine

## 2020-12-09 NOTE — Assessment & Plan Note (Addendum)
Labs not significant for metabolic causes for excessive fatigue.  Will obtain sleep studies Trial Vit D 50000 mcg qweekly x6/52 Encourage Sleep hygiene  Follow up 6-8 weeks

## 2020-12-14 ENCOUNTER — Ambulatory Visit: Payer: 59

## 2020-12-15 DIAGNOSIS — I8312 Varicose veins of left lower extremity with inflammation: Secondary | ICD-10-CM | POA: Diagnosis not present

## 2020-12-16 NOTE — Progress Notes (Deleted)
    SUBJECTIVE:   CHIEF COMPLAINT / HPI:   ***  PERTINENT  PMH / PSH: ***  OBJECTIVE:   LMP 12/01/2020    General: Alert, no acute distress Cardio: Normal S1 and S2, RRR, no r/m/g Pulm: CTAB, normal work of breathing Abdomen: Bowel sounds normal. Abdomen soft and non-tender.  Extremities: No peripheral edema.  Neuro: Cranial nerves grossly intact   ASSESSMENT/PLAN:   No problem-specific Assessment & Plan notes found for this encounter.     Dana Allan, MD [facility]

## 2020-12-21 ENCOUNTER — Ambulatory Visit: Payer: 59 | Admitting: Family Medicine

## 2020-12-28 ENCOUNTER — Ambulatory Visit: Payer: 59 | Admitting: Plastic Surgery

## 2021-02-27 ENCOUNTER — Encounter (HOSPITAL_COMMUNITY): Payer: Self-pay | Admitting: Obstetrics & Gynecology

## 2021-02-27 ENCOUNTER — Inpatient Hospital Stay (HOSPITAL_COMMUNITY)
Admission: AD | Admit: 2021-02-27 | Discharge: 2021-02-28 | Disposition: A | Discharge status: Home / Self Care | Payer: 59 | Attending: Obstetrics & Gynecology | Admitting: Obstetrics & Gynecology

## 2021-02-27 ENCOUNTER — Other Ambulatory Visit: Payer: Self-pay

## 2021-02-27 DIAGNOSIS — Z3A09 9 weeks gestation of pregnancy: Secondary | ICD-10-CM | POA: Diagnosis not present

## 2021-02-27 DIAGNOSIS — O99891 Other specified diseases and conditions complicating pregnancy: Secondary | ICD-10-CM

## 2021-02-27 DIAGNOSIS — O09521 Supervision of elderly multigravida, first trimester: Secondary | ICD-10-CM | POA: Insufficient documentation

## 2021-02-27 DIAGNOSIS — Z3491 Encounter for supervision of normal pregnancy, unspecified, first trimester: Secondary | ICD-10-CM

## 2021-02-27 DIAGNOSIS — R109 Unspecified abdominal pain: Secondary | ICD-10-CM | POA: Diagnosis not present

## 2021-02-27 DIAGNOSIS — Z79899 Other long term (current) drug therapy: Secondary | ICD-10-CM | POA: Diagnosis not present

## 2021-02-27 DIAGNOSIS — O26891 Other specified pregnancy related conditions, first trimester: Secondary | ICD-10-CM | POA: Insufficient documentation

## 2021-02-27 DIAGNOSIS — O26899 Other specified pregnancy related conditions, unspecified trimester: Secondary | ICD-10-CM

## 2021-02-27 DIAGNOSIS — Z3490 Encounter for supervision of normal pregnancy, unspecified, unspecified trimester: Secondary | ICD-10-CM

## 2021-02-27 LAB — POCT PREGNANCY, URINE: Preg Test, Ur: POSITIVE — AB

## 2021-02-27 NOTE — MAU Note (Signed)
Pt reports lower abd pain off/on x 1 month. Positive home preg test today. LMP 03/16. Denies bleeding.

## 2021-02-27 NOTE — MAU Provider Note (Signed)
History     CSN: [id number]  Arrival date and time: 02/27/21 2244   Event Date/Time   First Provider Initiated Contact with Patient 02/27/21 2352      Chief Complaint  Patient presents with  . Possible Pregnancy  . Abdominal Pain   Christine Cervantes is a 35 y.o. [id number] at [redacted]w[redacted]d by Unsure LMP of December 22, 2020.  She presents today for Possible Pregnancy and Abdominal Pain.  She states she has been experiencing abdominal pain for the past month.  However, she states she contributed it to previous surgery.  She describes the pain as sharp pain that radiates to her back.  She states the pain is aggravated by laying on her back and is improved with laying on her side.  However, she states she can not lay on her side for extended periods of time d/t her liposuction surgery she had. She rates her pain a 4-5/10.  She reports the pain is intermittent in nature.    OB History    Gravida  7   Para  4   Term  2   Preterm  2   AB  2   Living  4     SAB  2   IAB      Ectopic      Multiple  0   Live Births  4           Past Medical History:  Diagnosis Date  . Anemia   . Depression   . Headache(784.0)   . Pregnancy induced hypertension    no meds  . Vaginal Pap smear, abnormal     Past Surgical History:  Procedure Laterality Date  . BREAST REDUCTION SURGERY Bilateral 03/12/2019   Procedure: BILATERAL MAMMARY REDUCTION  (BREAST);  Surgeon: Peggye Form, DO;  Location: [facility];  Service: Plastics;  Laterality: Bilateral;  3.5 hours, please  . FOOT SURGERY  2004-2004   corns removed from both feet, hammertoes repaired  . GYNECOLOGIC CRYOSURGERY    . HAMMER TOE SURGERY    . LIPOSUCTION    . REDUCTION MAMMAPLASTY Bilateral 03/12/2019    Family History  Problem Relation Age of Onset  . Hypertension Mother   . Hypertension Maternal Aunt   . Hypertension Maternal Grandmother   . Diabetes Paternal Grandmother   . Anesthesia problems Neg Hx    . Other Neg Hx     Social History   Tobacco Use  . Smoking status: Never Smoker  . Smokeless tobacco: Never Used  Vaping Use  . Vaping Use: Never used  Substance Use Topics  . Alcohol use: Yes    Alcohol/week: 0.0 standard drinks    Comment: occ before preg  . Drug use: No    Allergies:  Allergies  Allergen Reactions  . Latex Swelling and Rash    vaginally    Medications Prior to Admission  Medication Sig Dispense Refill Last Dose  . Ascorbic Acid (VITAMIN C PO) Take 1 tablet by mouth daily.   Past Week at Unknown time  . hydrochlorothiazide (HYDRODIURIL) 12.5 MG tablet Take 1 tablet by mouth once daily 90 tablet 2 02/26/2021 at Unknown time  . Vitamin D, Ergocalciferol, (DRISDOL) 1.25 MG (50000 UNIT) CAPS capsule Take 1 capsule (50,000 Units total) by mouth every 7 (seven) days. 6 capsule 0 Past Week at Unknown time  . VITAMIN E PO Take 1 tablet by mouth daily.   Past Week at Unknown time  . acetaminophen (TYLENOL)  325 MG tablet Take 650 mg by mouth every 6 (six) hours as needed for mild pain or headache.     . benzonatate (TESSALON) 100 MG capsule Take 1 capsule (100 mg total) by mouth 2 (two) times daily as needed for cough. 20 capsule 0   . ferrous sulfate 325 (65 FE) MG tablet Take 1 tablet (325 mg total) by mouth daily. 30 tablet 0   . FLUoxetine (PROZAC) 10 MG capsule Take 1 capsule (10 mg total) by mouth daily. 30 capsule 3   . Menthol-Methyl Salicylate (MUSCLE RUB) 10-15 % CREA Apply 1 application topically as needed for muscle pain.     Marland Kitchen ondansetron (ZOFRAN ODT) 4 MG disintegrating tablet Take 1 tablet (4 mg total) by mouth every 8 (eight) hours as needed for nausea or vomiting. (Patient not taking: Reported on 06/08/2020) 4 tablet 0     Review of Systems  Gastrointestinal: Positive for abdominal pain. Negative for constipation, diarrhea, nausea and vomiting.  Genitourinary: Negative for difficulty urinating, dysuria, vaginal bleeding and vaginal discharge.   Musculoskeletal: Positive for back pain.  Neurological: Positive for dizziness (None currently, but occured while cooking this afternoon.). Negative for light-headedness and headaches.   Physical Exam   Blood pressure 132/88, pulse 82, temperature 98.2 F (36.8 C), temperature source Oral, resp. rate 16, height 5\' 4"  (1.626 m), weight 98.4 kg, last menstrual period 12/22/2020, SpO2 100 %.  Physical Exam Constitutional:      Appearance: She is well-developed.  HENT:     Head: Normocephalic and atraumatic.  Eyes:     Conjunctiva/sclera: Conjunctivae normal.  Cardiovascular:     Rate and Rhythm: Normal rate and regular rhythm.  Pulmonary:     Effort: Pulmonary effort is normal. No respiratory distress.     Breath sounds: Normal breath sounds.  Abdominal:     General: Bowel sounds are normal.     Tenderness: There is no abdominal tenderness.  Musculoskeletal:     Cervical back: Normal range of motion.  Skin:    General: Skin is warm and dry.  Neurological:     Mental Status: She is alert and oriented to person, place, and time.  Psychiatric:        Mood and Affect: Mood normal.        Behavior: Behavior normal.        Thought Content: Thought content normal.     MAU Course  Procedures Results for orders placed or performed during the hospital encounter of 02/27/21 (from the past 24 hour(s))  Pregnancy, urine POC     Status: Abnormal   Collection Time: 02/27/21 11:07 PM  Result Value Ref Range   Preg Test, Ur POSITIVE (A) NEGATIVE  Wet prep, genital     Status: None   Collection Time: 02/27/21 11:42 PM   Specimen: PATH Cytology Cervicovaginal Ancillary Only  Result Value Ref Range   Yeast Wet Prep HPF POC NONE SEEN NONE SEEN   Trich, Wet Prep NONE SEEN NONE SEEN   Clue Cells Wet Prep HPF POC NONE SEEN NONE SEEN   WBC, Wet Prep HPF POC NONE SEEN NONE SEEN   Sperm NONE SEEN   CBC     Status: Abnormal   Collection Time: 02/27/21 11:47 PM  Result Value Ref Range   WBC  5.6 4.0 - 10.5 K/uL   RBC 4.25 3.87 - 5.11 MIL/uL   Hemoglobin 11.1 (L) 12.0 - 15.0 g/dL   HCT 03/01/21 (L) 25.0 - 53.9 %  MCV 84.2 80.0 - 100.0 fL   MCH 26.1 26.0 - 34.0 pg   MCHC 31.0 30.0 - 36.0 g/dL   RDW 19.6 22.2 - 97.9 %   Platelets 227 150 - 400 K/uL   nRBC 0.0 0.0 - 0.2 %  hCG, quantitative, pregnancy     Status: Abnormal   Collection Time: 02/27/21 11:47 PM  Result Value Ref Range   hCG, Beta Chain, Quant, S 25,157 (H) <5 mIU/mL  Urinalysis, Routine w reflex microscopic Urine, Clean Catch     Status: Abnormal   Collection Time: 02/28/21 12:00 AM  Result Value Ref Range   Color, Urine YELLOW YELLOW   APPearance HAZY (A) CLEAR   Specific Gravity, Urine 1.023 1.005 - 1.030   pH 5.0 5.0 - 8.0   Glucose, UA NEGATIVE NEGATIVE mg/dL   Hgb urine dipstick NEGATIVE NEGATIVE   Bilirubin Urine NEGATIVE NEGATIVE   Ketones, ur NEGATIVE NEGATIVE mg/dL   Protein, ur NEGATIVE NEGATIVE mg/dL   Nitrite NEGATIVE NEGATIVE   Leukocytes,Ua NEGATIVE NEGATIVE    MDM Physical Exam Wet Prep and GC/CT Labs: UA, UPT, CBC, hCG Ultrasound Assessment and Plan  35 year old  [id number] at 9.5 weeks by Unsure LMP Abdominal Pain  -POC Reviewed. -Informed of labs to be collected and why. -Offered and declines pain medication. -Will have nurse collect cultures blindly. -Will send for [place] and await results.   Cherre Robins 02/27/2021, 11:52 PM   Reassessment (1:21 AM)  -Results as above. [place] images reviewed. -Provider to bedside to discuss. -Exam completed. -Informed that findings are not c/w [redacted] week GA. -Discussed need for repeat US in 10-14 days. -Bleeding Precautions discussed. -[place] order placed. -Encouraged to call or return to MAU if symptoms worsen or with the onset of new symptoms. -Discharged to home in stable condition.  Cherre Robins MSN, CNM Advanced Practice Provider, [facility]

## 2021-02-28 ENCOUNTER — Inpatient Hospital Stay (HOSPITAL_COMMUNITY): Payer: 59

## 2021-02-28 DIAGNOSIS — O09521 Supervision of elderly multigravida, first trimester: Secondary | ICD-10-CM | POA: Diagnosis not present

## 2021-02-28 DIAGNOSIS — Z3A09 9 weeks gestation of pregnancy: Secondary | ICD-10-CM | POA: Diagnosis not present

## 2021-02-28 DIAGNOSIS — O26891 Other specified pregnancy related conditions, first trimester: Secondary | ICD-10-CM | POA: Diagnosis not present

## 2021-02-28 DIAGNOSIS — Z79899 Other long term (current) drug therapy: Secondary | ICD-10-CM | POA: Diagnosis not present

## 2021-02-28 DIAGNOSIS — R109 Unspecified abdominal pain: Secondary | ICD-10-CM | POA: Diagnosis not present

## 2021-02-28 LAB — WET PREP, GENITAL
Clue Cells Wet Prep HPF POC: NONE SEEN
Sperm: NONE SEEN
Trich, Wet Prep: NONE SEEN
WBC, Wet Prep HPF POC: NONE SEEN
Yeast Wet Prep HPF POC: NONE SEEN

## 2021-02-28 LAB — CBC
HCT: 35.8 % — ABNORMAL LOW (ref 36.0–46.0)
Hemoglobin: 11.1 g/dL — ABNORMAL LOW (ref 12.0–15.0)
MCH: 26.1 pg (ref 26.0–34.0)
MCHC: 31 g/dL (ref 30.0–36.0)
MCV: 84.2 fL (ref 80.0–100.0)
Platelets: 227 10*3/uL (ref 150–400)
RBC: 4.25 MIL/uL (ref 3.87–5.11)
RDW: 12.8 % (ref 11.5–15.5)
WBC: 5.6 10*3/uL (ref 4.0–10.5)
nRBC: 0 % (ref 0.0–0.2)

## 2021-02-28 LAB — HCG, QUANTITATIVE, PREGNANCY: hCG, Beta Chain, Quant, S: 25157 m[IU]/mL — ABNORMAL HIGH (ref <5)

## 2021-02-28 LAB — URINALYSIS, ROUTINE W REFLEX MICROSCOPIC
Bilirubin Urine: NEGATIVE
Glucose, UA: NEGATIVE mg/dL
Hgb urine dipstick: NEGATIVE
Ketones, ur: NEGATIVE mg/dL
Leukocytes,Ua: NEGATIVE
Nitrite: NEGATIVE
Protein, ur: NEGATIVE mg/dL
Specific Gravity, Urine: 1.023 (ref 1.005–1.030)
pH: 5 (ref 5.0–8.0)

## 2021-02-28 LAB — GC/CHLAMYDIA PROBE AMP (~~LOC~~) NOT AT ARMC
Chlamydia: NEGATIVE
Comment: NEGATIVE
Comment: NORMAL
Neisseria Gonorrhea: NEGATIVE

## 2021-02-28 NOTE — Discharge Instructions (Signed)
Abdominal Pain During Pregnancy Abdominal pain is common during pregnancy and has many possible causes. Some causes are more serious than others, and sometimes the cause is not known. Abdominal pain can be a sign that labor is starting. It can also be caused by normal growth of your baby causing stretching of muscles and ligaments during pregnancy. Always tell your health care provider if you have any abdominal pain. Follow these instructions at home:  Do not have sex or put anything in your vagina until your pain goes away completely.  Get plenty of rest until your pain improves.  Drink enough fluid to keep your urine pale yellow.  Take over-the-counter and prescription medicines only as told by your health care provider.  Keep all follow-up visits. This is important.   Contact a health care provider if:  Your pain continues or gets worse after resting.  You have lower abdominal pain that: ? Comes and goes at regular intervals. ? Spreads to your back. ? Is similar to menstrual cramps.  You have pain or burning when you urinate. Get help right away if:  You have a fever, chills, or shortness of breath.  You have vaginal bleeding.  You are leaking fluid or passing tissue from your vagina.  You have vomiting or diarrhea that lasts for more than 24 hours.  Your baby is moving less than usual.  You feel very weak or faint.  You develop severe pain in your upper abdomen. Summary  Abdominal pain is common during pregnancy and has many possible causes.  If you experience abdominal pain during pregnancy, tell your health care provider right away.  Follow your health care provider's home care instructions and keep all follow-up visits as told. This information is not intended to replace advice given to you by your health care provider. Make sure you discuss any questions you have with your health care provider. Document Revised: 06/08/2020 Document Reviewed: 06/08/2020 Elsevier  Patient Education  2021 Elsevier Inc.  

## 2021-03-10 ENCOUNTER — Ambulatory Visit (HOSPITAL_BASED_OUTPATIENT_CLINIC_OR_DEPARTMENT_OTHER): Payer: 59 | Attending: Family Medicine | Admitting: Internal Medicine

## 2021-03-14 ENCOUNTER — Inpatient Hospital Stay: Admission: RE | Admit: 2021-03-14 | Payer: 59 | Source: Ambulatory Visit

## 2021-03-15 ENCOUNTER — Encounter (HOSPITAL_COMMUNITY): Payer: Self-pay | Admitting: Obstetrics and Gynecology

## 2021-03-15 ENCOUNTER — Other Ambulatory Visit: Payer: Self-pay

## 2021-03-15 ENCOUNTER — Inpatient Hospital Stay (HOSPITAL_COMMUNITY)
Admission: AD | Admit: 2021-03-15 | Discharge: 2021-03-16 | Disposition: A | Discharge status: Home / Self Care | Payer: 59 | Attending: Obstetrics and Gynecology | Admitting: Obstetrics and Gynecology

## 2021-03-15 ENCOUNTER — Inpatient Hospital Stay (HOSPITAL_COMMUNITY): Payer: 59

## 2021-03-15 DIAGNOSIS — N939 Abnormal uterine and vaginal bleeding, unspecified: Secondary | ICD-10-CM | POA: Diagnosis not present

## 2021-03-15 DIAGNOSIS — N8500 Endometrial hyperplasia, unspecified: Secondary | ICD-10-CM | POA: Diagnosis not present

## 2021-03-15 DIAGNOSIS — O3481 Maternal care for other abnormalities of pelvic organs, first trimester: Secondary | ICD-10-CM | POA: Insufficient documentation

## 2021-03-15 DIAGNOSIS — O209 Hemorrhage in early pregnancy, unspecified: Secondary | ICD-10-CM | POA: Diagnosis not present

## 2021-03-15 DIAGNOSIS — O09521 Supervision of elderly multigravida, first trimester: Secondary | ICD-10-CM | POA: Insufficient documentation

## 2021-03-15 DIAGNOSIS — Z3A12 12 weeks gestation of pregnancy: Secondary | ICD-10-CM | POA: Insufficient documentation

## 2021-03-15 DIAGNOSIS — O034 Incomplete spontaneous abortion without complication: Secondary | ICD-10-CM

## 2021-03-15 DIAGNOSIS — O039 Complete or unspecified spontaneous abortion without complication: Secondary | ICD-10-CM | POA: Insufficient documentation

## 2021-03-15 DIAGNOSIS — R102 Pelvic and perineal pain: Secondary | ICD-10-CM | POA: Insufficient documentation

## 2021-03-15 DIAGNOSIS — O219 Vomiting of pregnancy, unspecified: Secondary | ICD-10-CM | POA: Insufficient documentation

## 2021-03-15 DIAGNOSIS — N83201 Unspecified ovarian cyst, right side: Secondary | ICD-10-CM | POA: Insufficient documentation

## 2021-03-15 DIAGNOSIS — R197 Diarrhea, unspecified: Secondary | ICD-10-CM | POA: Diagnosis not present

## 2021-03-15 DIAGNOSIS — Z3A00 Weeks of gestation of pregnancy not specified: Secondary | ICD-10-CM | POA: Diagnosis not present

## 2021-03-15 LAB — HCG, QUANTITATIVE, PREGNANCY: hCG, Beta Chain, Quant, S: 101784 m[IU]/mL — ABNORMAL HIGH (ref <5)

## 2021-03-15 MED ORDER — ONDANSETRON 4 MG PO TBDP: 4.0000 mg | ORAL_TABLET | Freq: Four times a day (QID) | ORAL | 0 refills | Status: DC | PRN

## 2021-03-15 MED ORDER — PROMETHAZINE HCL 25 MG PO TABS: 25.0000 mg | ORAL_TABLET | Freq: Four times a day (QID) | ORAL | 2 refills | Status: DC | PRN

## 2021-03-15 MED ORDER — LACTATED RINGERS IV SOLN: Freq: Once | INTRAVENOUS | Status: AC

## 2021-03-15 MED ORDER — OXYCODONE-ACETAMINOPHEN 5-325 MG PO TABS: 1.0000 | ORAL_TABLET | Freq: Four times a day (QID) | ORAL | 0 refills | Status: DC | PRN

## 2021-03-15 MED ORDER — ONDANSETRON HCL 4 MG/2ML IJ SOLN
4.0000 mg | Freq: Once | INTRAMUSCULAR | Status: AC
  Administered 2021-03-15: 4 mg via INTRAVENOUS
  Filled 2021-03-15: qty 2

## 2021-03-15 NOTE — MAU Provider Note (Signed)
Chief Complaint: Vaginal Bleeding   Event Date/Time   First Provider Initiated Contact with Patient 03/15/21 2203        SUBJECTIVE HPI: Christine Cervantes is a 35 y.o. [id number] at [redacted]w[redacted]d by LMP who presents to maternity admissions reporting vaginal bleeding with clots and pelvic cramping. . She denies vaginal bleeding, vaginal itching/burning, urinary symptoms, h/a, dizziness, or fever/chills.     Vaginal Bleeding The patient's primary symptoms include pelvic pain and vaginal bleeding. The patient's pertinent negatives include no genital itching, genital lesions or genital odor. This is a new problem. The current episode started today. The problem occurs intermittently. The problem has been unchanged. The pain is moderate. The problem affects both sides. She is pregnant. Associated symptoms include abdominal pain, diarrhea, nausea and vomiting. Pertinent negatives include no constipation, fever or headaches. The vaginal discharge was bloody. The vaginal bleeding is heavier than menses. She has been passing clots. She has not been passing tissue. Nothing aggravates the symptoms. She has tried nothing for the symptoms.   RN Note: Pt stated she stared having some vaginal bleeding  and cramping  around 3pm today passing clots . Changed pad about 3 times.  Also reports she  had Vomiting and diarrhea since yesterday  Past Medical History:  Diagnosis Date  . Anemia   . Depression   . Headache(784.0)   . Pregnancy induced hypertension    no meds  . Vaginal Pap smear, abnormal    Past Surgical History:  Procedure Laterality Date  . BREAST REDUCTION SURGERY Bilateral 03/12/2019   Procedure: BILATERAL MAMMARY REDUCTION  (BREAST);  Surgeon: Peggye Form, DO;  Location: [facility];  Service: Plastics;  Laterality: Bilateral;  3.5 hours, please  . FOOT SURGERY  2004-2004   corns removed from both feet, hammertoes repaired  . GYNECOLOGIC CRYOSURGERY    . HAMMER TOE SURGERY    .  LIPOSUCTION    . REDUCTION MAMMAPLASTY Bilateral 03/12/2019   Social History   Socioeconomic History  . Marital status: Single    Spouse name: Not on file  . Number of children: Not on file  . Years of education: Not on file  . Highest education level: Not on file  Occupational History  . Not on file  Tobacco Use  . Smoking status: Never Smoker  . Smokeless tobacco: Never Used  Vaping Use  . Vaping Use: Never used  Substance and Sexual Activity  . Alcohol use: Yes    Alcohol/week: 0.0 standard drinks    Comment: occ before preg  . Drug use: No  . Sexual activity: Yes    Birth control/protection: None    Comment: January 22 2015 last intercourse; ist depo in Jan 2016  Other Topics Concern  . Not on file  Social History Narrative  . Not on file   Social Determinants of Health   Financial Resource Strain: Not on file  Food Insecurity: Not on file  Transportation Needs: Not on file  Physical Activity: Not on file  Stress: Not on file  Social Connections: Not on file  Intimate Partner Violence: Not on file   No current facility-administered medications on file prior to encounter.   Current Outpatient Medications on File Prior to Encounter  Medication Sig Dispense Refill  . acetaminophen (TYLENOL) 325 MG tablet Take 650 mg by mouth every 6 (six) hours as needed for mild pain or headache.    . Ascorbic Acid (VITAMIN C PO) Take 1 tablet by mouth daily.    Marland Kitchen  benzonatate (TESSALON) 100 MG capsule Take 1 capsule (100 mg total) by mouth 2 (two) times daily as needed for cough. 20 capsule 0  . ferrous sulfate 325 (65 FE) MG tablet Take 1 tablet (325 mg total) by mouth daily. 30 tablet 0  . FLUoxetine (PROZAC) 10 MG capsule Take 1 capsule (10 mg total) by mouth daily. 30 capsule 3  . hydrochlorothiazide (HYDRODIURIL) 12.5 MG tablet Take 1 tablet by mouth once daily 90 tablet 2  . Menthol-Methyl Salicylate (MUSCLE RUB) 10-15 % CREA Apply 1 application topically as needed for muscle  pain.    Marland Kitchen ondansetron (ZOFRAN ODT) 4 MG disintegrating tablet Take 1 tablet (4 mg total) by mouth every 8 (eight) hours as needed for nausea or vomiting. (Patient not taking: Reported on 06/08/2020) 4 tablet 0  . Vitamin D, Ergocalciferol, (DRISDOL) 1.25 MG (50000 UNIT) CAPS capsule Take 1 capsule (50,000 Units total) by mouth every 7 (seven) days. 6 capsule 0  . VITAMIN E PO Take 1 tablet by mouth daily.    . [DISCONTINUED] Ferrous Sulfate (IRON PO) Take by mouth daily. Liquid OTC iron not sure of dose     Allergies  Allergen Reactions  . Latex Swelling and Rash    vaginally    I have reviewed patient's Past Medical Hx, Surgical Hx, Family Hx, Social Hx, medications and allergies.   ROS:  Review of Systems  Constitutional: Negative for fever.  Gastrointestinal: Positive for abdominal pain, diarrhea, nausea and vomiting. Negative for constipation.  Genitourinary: Positive for pelvic pain and vaginal bleeding.  Neurological: Negative for headaches.   Review of Systems  Other systems negative   Physical Exam  Physical Exam Patient Vitals for the past 24 hrs:  BP Temp Pulse Resp Height Weight  03/15/21 2149 (!) 137/92 98.5 F (36.9 C) 96 18 5\' 4"  (1.626 m) 95.7 kg   Constitutional: Well-developed, well-nourished female in no acute distress.  Cardiovascular: normal rate Respiratory: normal effort GI: Abd soft, non-tender.  MS: Extremities nontender, no edema, normal ROM Neurologic: Alert and oriented x 4.  GU: Neg CVAT.  PELVIC EXAM: Moderately heavy vaginal bleeding.  Bimanual deferred due to pain and plan to get for definitive diagnosis.   LAB RESULTS    Results for orders placed or performed during the hospital encounter of 03/15/21 (from the past 24 hour(s))  hCG, quantitative, pregnancy     Status: Abnormal   Collection Time: 03/15/21 10:43 PM  Result Value Ref Range   hCG, Beta Chain, Quant, S 101,784 (H) <5 mIU/mL     IMAGING 05/15/21 OB Transvaginal  Result  Date: 03/15/2021 CLINICAL DATA:  Vaginal bleeding. EXAM: OBSTETRIC <14 WK 05/15/2021 AND TRANSVAGINAL OB US TECHNIQUE: Both transabdominal and transvaginal ultrasound examinations were performed for complete evaluation of the gestation as well as the maternal uterus, adnexal regions, and pelvic cul-de-sac. Transvaginal technique was performed to assess early pregnancy. COMPARISON:  Feb 28, 2021 FINDINGS: Intrauterine gestational sac: None Yolk sac:  Not Visualized. Embryo:  Not Visualized. Cardiac Activity: Not Visualized. Heart Rate: N/A  bpm Subchorionic hemorrhage:  None visualized. Maternal uterus/adnexae: A thickened, heterogeneous endometrium is noted. A 2.6 cm x 2.8 cm x 2.6 cm right ovarian cyst is seen. The left ovary is not visualized. No pelvic free fluid is seen. IMPRESSION: 1. No evidence of an intrauterine pregnancy, with nonvisualization of the intrauterine gestational sac and yolk sac seen on the prior study. 2. Thickened endometrium without evidence of retained products of conception. Electronically Signed  By: Aram Candela M.D.   On: 03/15/2021 23:18   US OB LESS THAN 14 WEEKS WITH OB TRANSVAGINAL  Result Date: 02/28/2021 CLINICAL DATA:  Last menstrual period 12/22/2020 but unsure. Abdominal pain. Gestational age by unsure last menstrual period 9 weeks and 5 days. Estimated due date based on unsure lmp of 09/28/2021. EXAM: OBSTETRIC <14 WK [place] AND TRANSVAGINAL OB US TECHNIQUE: Both transabdominal and transvaginal ultrasound examinations were performed for complete evaluation of the gestation as well as the maternal uterus, adnexal regions, and pelvic cul-de-sac. Transvaginal technique was performed to assess early pregnancy. COMPARISON:  None. FINDINGS: Intrauterine gestational sac: Single Yolk sac:  Visualized. Embryo:  Not Visualized. MSD: 13.7 mm   6 w   1 d Subchorionic hemorrhage:  None visualized. Maternal uterus/adnexae: Bilateral ovaries are unremarkable with a corpus luteum cyst noted  within the right ovary. IMPRESSION: Intrauterine gestational sac and yolk sac, but no fetal pole or cardiac activity yet visualized. Recommend follow-up quantitative B-HCG levels and follow-up US in 14 days to assess viability. This recommendation follows SRU consensus guidelines: Diagnostic Criteria for Nonviable Pregnancy Early in the First Trimester. Malva Limes Med 2013; 644:0347-42. Electronically Signed   By: Tish Frederickson M.D.   On: 02/28/2021 01:00     MAU Management/MDM: Ordered Ultrasound to rule out SAB  This bleeding/pain can represent a normal pregnancy with bleeding, spontaneous abortion or even an ectopic which can be life-threatening.  The process as listed above helps to determine which of these is present.  Reviewed findings of ultrasound which reflect passage of Gestational sac seen on prior US.  Discussed this means this is a miscarriage in process.  Discussed will likely have more bleeding and clots with cramping.   Offered analgesia, but pt is driving, so narcotic sent to 24 hr pharmacy with phenergan.  ASSESSMENT Single IUP at [redacted]w[redacted]d Miscarriage in process  PLAN Discharge home Bleeding precautions Rx Percocet for pain Rx Phenergan for nausea Pt stable at time of discharge. Encouraged to return here if she develops worsening of symptoms, increase in pain, fever, or other concerning symptoms.  Message sent to office for followup appointment  Wynelle Bourgeois CNM, MSN Certified Nurse-Midwife 03/15/2021  10:03 PM

## 2021-03-15 NOTE — Discharge Instructions (Signed)
Incomplete Miscarriage An incomplete miscarriage happens when tissue from pregnancy or part of the placenta, known as products of conception, remain in the body after a miscarriage. A miscarriage is the loss of pregnancy before the 20th week. Most miscarriages happen in the first 3 months of pregnancy. Sometimes, a miscarriage happens before a woman knows she is pregnant. Having a miscarriage can be an emotional experience. If you have had a miscarriage, talk with your health care provider about any questions you may have about:  The loss of your baby.  The grieving process.  Your future pregnancy plans. What are the causes? Many times, the cause of an incomplete miscarriage is not known. What increases the risk? The following factors may make a pregnant woman more likely to have an incomplete miscarriage:  Using a watch-and-wait approach (expectant management) to treat a miscarriage.  Using medicine to treat a miscarriage. What are the signs or symptoms? Symptoms of this condition include:  Vaginal bleeding or spotting, with or without cramps or pain.  Pain or cramping in the abdomen or lower back.  Fluid or tissue coming out of the vagina. How is this diagnosed? This condition may be diagnosed based on:  A physical exam.  Ultrasound. How is this treated? An incomplete miscarriage may be treated with:  Dilation and curettage (D&C). In this procedure, the cervix is stretched open and any remaining pregnancy tissue is removed from the lining of the uterus (endometrium). The cervix is the lowest part of the uterus, which opens into the vagina.  Medicines. These may include: ? Antibiotic medicine, to treat infection. ? Medicine to help any remaining pregnancy tissue come out of the uterus. ? Medicine to reduce (contract) the size of the uterus. These medicines may be given if there is a lot of bleeding. If you have Rh-negative blood, you may be given an injection of Rho(D) immune  globulin to help prevent problems with future pregnancies. Follow these instructions at home: Medicines  Take over-the-counter and prescription medicines only as told by your health care provider.  If you were prescribed antibiotic medicine, take your antibiotic as told by your health care provider. Do not stop taking the antibiotic even if you start to feel better. Activity  Rest as told by your health care provider. Ask your health care provider what activities are safe for you.  Have someone help with home and family responsibilities during this time. General instructions  Monitor how much tissue or blood comes out of the vagina.  Do not have sex, douche, or put anything in your vagina, such as tampons, until your health care provider says it is okay.  To help you and your partner with the grieving process, talk with your health care provider or get counseling to help deal with the pregnancy loss.  When you are ready, meet with your health care provider to discuss any important steps you should take for your health. Also, discuss steps you should take to have a healthy pregnancy in the future.  Keep all follow-up visits. This is important.   Where to find more information  The American College of Obstetricians and Gynecologists: acog.org  [place] Department of Health and Human Services Office of Women's Health: hrsa.gov/office-womens-health Contact a health care provider if:  You have a fever or chills.  There is bad-smelling fluid coming from your vagina.  You have more bleeding instead of less.  Tissue or blood clots come out of your vagina. Get help right away if:  You   have severe cramps or pain in your back or abdomen.  Heavy bleeding soaks through 2 large sanitary pads an hour for more than 2 hours.  You become light-headed or weak.  You faint.  You feel sad, and your sadness takes over your thoughts.  You think about hurting yourself. If you ever feel like you  may hurt yourself or others, or have thoughts about taking your own life, get help right away. Go to your nearest emergency department or:  Call your local emergency services (911 in the [place]  Call a suicide crisis helpline, such as the National Suicide Prevention Lifeline at 1-800-273-8255. This is open 24 hours a day in the [place]  Text the Crisis Text Line at 741741 (in the [place] Summary  An incomplete miscarriage happens when tissue from pregnancy or part of the placenta, known as products of conception, remain in the body after a miscarriage.  Treatment may include a dilation and curettage (D&C) procedure or medicines. In a D&C procedure, tissue is removed from the uterus.  Rest as told by your health care provider. Ask your health care provider what activities are safe for you.  To help you and your partner with the grieving process, talk with your health care provider or get counseling to help deal with the pregnancy loss. This information is not intended to replace advice given to you by your health care provider. Make sure you discuss any questions you have with your health care provider. Document Revised: 03/26/2020 Document Reviewed: 03/26/2020 Elsevier Patient Education  2021 Elsevier Inc.  

## 2021-03-15 NOTE — MAU Note (Signed)
Pt stated she stared having some vaginal bleeding  and cramping  around 3pm today passing clots . Changed pad about 3 times.  Also reports she  had Vomiting and diarrhea since yesterday.

## 2021-03-16 DIAGNOSIS — O039 Complete or unspecified spontaneous abortion without complication: Secondary | ICD-10-CM

## 2021-03-16 HISTORY — DX: Complete or unspecified spontaneous abortion without complication: O03.9

## 2021-03-30 ENCOUNTER — Telehealth: Payer: Self-pay | Admitting: *Deleted

## 2021-03-30 NOTE — Telephone Encounter (Signed)
To patient regarding FMLA forms received.  Left message to call back to [place], [phone].

## 2021-03-31 NOTE — Telephone Encounter (Signed)
2nd call to patient regarding FMLA forms.

## 2021-04-04 ENCOUNTER — Telehealth: Payer: Self-pay | Admitting: *Deleted

## 2021-04-04 NOTE — Telephone Encounter (Signed)
Atient called for update on FMLA papers that were sent to hospital. Advised we can complete forms but need copy to be sent here as they have currently been sent to hospital. Patient needs dates from 6-7 thru 03-21-21 and advised we can complete this. Fax number [phone] to [employer]. Patient is scheduled on 04-07-21 and will have forms complete for her at appointment.  Encounter closed.

## 2021-04-04 NOTE — Telephone Encounter (Signed)
No response from patient. Encounter closed.

## 2021-04-07 ENCOUNTER — Encounter: Payer: Self-pay | Admitting: Obstetrics and Gynecology

## 2021-04-07 ENCOUNTER — Ambulatory Visit (INDEPENDENT_AMBULATORY_CARE_PROVIDER_SITE_OTHER): Payer: 59 | Admitting: Obstetrics and Gynecology

## 2021-04-07 ENCOUNTER — Other Ambulatory Visit: Payer: Self-pay

## 2021-04-07 VITALS — BP 126/87 | HR 88 | Ht 64.0 in | Wt 213.3 lb

## 2021-04-07 DIAGNOSIS — O039 Complete or unspecified spontaneous abortion without complication: Secondary | ICD-10-CM | POA: Diagnosis not present

## 2021-04-07 NOTE — Progress Notes (Signed)
  CC; miscarriage follow up Subjective:    Patient ID: Christine Cervantes, female    DOB: 04-Dec-1985, 35 y.o.   MRN: [id number]  HPI 35 yo [id number] seen for miscarriage follow up.  The patient was seen in the MAU on 03/15/21.  She had noted cramping and was passing clots.    Ultrasound showed no visible IUP.  No gestational sac or yolk sac was seen.  Pt says the bleeding continued for a few more days and she only has spotting.  She noted she would like to have one more child before ceasing childbearing.     Review of Systems     Objective:   Physical Exam Vitals:   04/07/21 1441  BP: 126/87  Pulse: 88         Assessment & Plan:   1. SAB (spontaneous abortion) Completed SAB.  Pt may attempt pregnancy in the next 1-2 months.  Discussed with patient that with advancing age, fertility may diminish with it becoming more difficult to achieve pregnancy.  Also discussed increasing risk of chromosomal abnormalities with advanced maternal age.  Pt declines any birth control at this time, but we have discussed possible progesterone IUD after her next child for contraception as well as decreasing menstrual intensity.  Will check quant to make sure her pregnancy hormone level has returned to normal. - Beta hCG quant (ref lab) I spent 10 minutes dedicated to the care of this patient including previsit review of records, face to face time with the patient discussing diagnosis, future plan of care and post visit testing.  Pap in 1 year f/u prn  Warden Fillers, MD Faculty Attending, [facility]

## 2021-04-08 LAB — BETA HCG QUANT (REF LAB): hCG Quant: 199 m[IU]/mL

## 2021-04-12 ENCOUNTER — Telehealth: Payer: Self-pay | Admitting: General Practice

## 2021-04-12 NOTE — Telephone Encounter (Signed)
-----   Message from Warden Fillers, MD sent at 04/12/2021  9:05 AM EDT ----- [facility]cg 199, recommend recheck in 2 weeks

## 2021-04-12 NOTE — Telephone Encounter (Signed)
Called patient & phone immediately went to voicemail. Left message to call office back for results. Will send mychart message.

## 2021-04-27 ENCOUNTER — Telehealth: Payer: Self-pay | Admitting: General Practice

## 2021-04-27 DIAGNOSIS — O039 Complete or unspecified spontaneous abortion without complication: Secondary | ICD-10-CM

## 2021-04-27 NOTE — Telephone Encounter (Signed)
Called patient regarding need to come in for bhcg appt. Patient verbalized understanding & states she can come 8/2 @ 130. Patient asked for Kennon Rounds stating she had some concerns regarding her FMLA paperwork and hasn't heard from her in a couple weeks. Told patient I would send her a message and ask her to reach out. Patient verbalized understanding.

## 2021-05-09 ENCOUNTER — Telehealth: Payer: Self-pay | Admitting: *Deleted

## 2021-05-09 NOTE — Telephone Encounter (Signed)
Call from patient- needs forms faxed today or will receive points for her absence. Is unable to come to office to sign. Will send My Chart message requesting forms be faxed to [employer] today- Advised will sent to [employer] now. See scanned documents. Encounter closed.

## 2021-05-09 NOTE — Telephone Encounter (Signed)
Patient left message with front office about FMLA forms. Return call to patient. Left message that forms are ready to be sent as soon as ROI received. Had anticipated her signature at appointment on 04-07-21. Forms completed as per previous phone call and are ready, once she comes to sign ROI- there is no phone number on forms to call [employer] to obtain ROI.

## 2021-05-10 ENCOUNTER — Other Ambulatory Visit: Payer: 59

## 2021-07-06 ENCOUNTER — Other Ambulatory Visit: Payer: Self-pay

## 2021-07-06 ENCOUNTER — Ambulatory Visit (INDEPENDENT_AMBULATORY_CARE_PROVIDER_SITE_OTHER): Payer: 59 | Admitting: Family Medicine

## 2021-07-06 VITALS — BP 138/90 | HR 99 | Temp 98.9°F

## 2021-07-06 DIAGNOSIS — J069 Acute upper respiratory infection, unspecified: Secondary | ICD-10-CM | POA: Diagnosis not present

## 2021-07-06 NOTE — Patient Instructions (Signed)
Thank you for coming to see me today. It was a pleasure.   Take your Vit D for the 6 weeks then make an appointment for follow up  I think you picked up a viral upper respiratory infection that is slowly resolving.    Please follow-up with PCP as needed  If you have any questions or concerns, please do not hesitate to call the office at [phone].  Best,   Dana Allan, MD    Upper Respiratory Infection, Adult An upper respiratory infection (URI) affects the nose, throat, and upper air passages. URIs are caused by germs (viruses). The most common type of URI is often called "the common cold." Medicines cannot cure URIs, but you can do things at home to relieve your symptoms. URIs usually get better within 7-10 days. Follow these instructions at home: Activity Rest as needed. If you have a fever, stay home from work or school until your fever is gone, or until your doctor says you may return to work or school. You should stay home until you cannot spread the infection anymore (you are not contagious). Your doctor may have you wear a face mask so you have less risk of spreading the infection. Relieving symptoms Gargle with a salt-water mixture 3-4 times a day or as needed. To make a salt-water mixture, completely dissolve -1 tsp of salt in 1 cup of warm water. Use a cool-mist humidifier to add moisture to the air. This can help you breathe more easily. Eating and drinking  Drink enough fluid to keep your pee (urine) pale yellow. Eat soups and other clear broths. General instructions  Take over-the-counter and prescription medicines only as told by your doctor. These include cold medicines, fever reducers, and cough suppressants. Do not use any products that contain nicotine or tobacco. These include cigarettes and e-cigarettes. If you need help quitting, ask your doctor. Avoid being where people are smoking (avoid secondhand smoke). Make sure you get regular shots and get the  flu shot every year. Keep all follow-up visits as told by your doctor. This is important. How to avoid spreading infection to others  Wash your hands often with soap and water. If you do not have soap and water, use hand sanitizer. Avoid touching your mouth, face, eyes, or nose. Cough or sneeze into a tissue or your sleeve or elbow. Do not cough or sneeze into your hand or into the air. Contact a doctor if: You are getting worse, not better. You have any of these: A fever. Chills. Brown or red mucus in your nose. Yellow or brown fluid (discharge)coming from your nose. Pain in your face, especially when you bend forward. Swollen neck glands. Pain with swallowing. White areas in the back of your throat. Get help right away if: You have shortness of breath that gets worse. You have very bad or constant: Headache. Ear pain. Pain in your forehead, behind your eyes, and over your cheekbones (sinus pain). Chest pain. You have long-lasting (chronic) lung disease along with any of these: Wheezing. Long-lasting cough. Coughing up blood. A change in your usual mucus. You have a stiff neck. You have changes in your: Vision. Hearing. Thinking. Mood. Summary An upper respiratory infection (URI) is caused by a germ called a virus. The most common type of URI is often called "the common cold." URIs usually get better within 7-10 days. Take over-the-counter and prescription medicines only as told by your doctor. This information is not intended to replace advice given to  you by your health care provider. Make sure you discuss any questions you have with your health care provider. Document Revised: 06/03/2020 Document Reviewed: 06/03/2020 Elsevier Patient Education  2022 [employer].

## 2021-07-06 NOTE — Progress Notes (Signed)
    SUBJECTIVE:   CHIEF COMPLAINT / HPI:   Primary symptom:congestion  Duration:6 days Severity:improving,80% better Associated symptoms:runny nose, sneezing,1 episode loose stool, body chills Fever? Tmax?: none Sick contacts:2 children with similar symptoms Covid test:yes as recommended by [facility],  negative Covid vaccination(s):yes   PERTINENT  PMH / PSH:  None  OBJECTIVE:   BP 138/90   Pulse 99   Temp 98.9 F (37.2 C) (Oral)   LMP 06/20/2021   SpO2 99%    General: Alert, no acute distress HEENT: TM's wnl,no lymphadenopathy Cardio: Normal S1 and S2, RRR, no r/m/g Pulm: CTAB, normal work of breathing  ASSESSMENT/PLAN:   Viral URI Given symptoms improving and children with similar symptoms likely viral etiology -Symptom management -Note for work provided -Strict return precautions provided -Follow up as needed     Dana Allan, MD [facility]

## 2021-07-09 ENCOUNTER — Encounter: Payer: Self-pay | Admitting: Family Medicine

## 2021-07-09 DIAGNOSIS — J069 Acute upper respiratory infection, unspecified: Secondary | ICD-10-CM | POA: Insufficient documentation

## 2021-07-09 NOTE — Assessment & Plan Note (Signed)
Given symptoms improving and children with similar symptoms likely viral etiology -Symptom management -Note for work provided -Strict return precautions provided -Follow up as needed

## 2021-08-02 IMAGING — DX DG FOOT COMPLETE 3+V*L*
3 series · 3 of 3 positions shown · non-contrast
Comparison: None.

CLINICAL DATA: Pain

EXAM:
LEFT FOOT - COMPLETE 3+ VIEW

[dg foot complete left (1 of 3)]
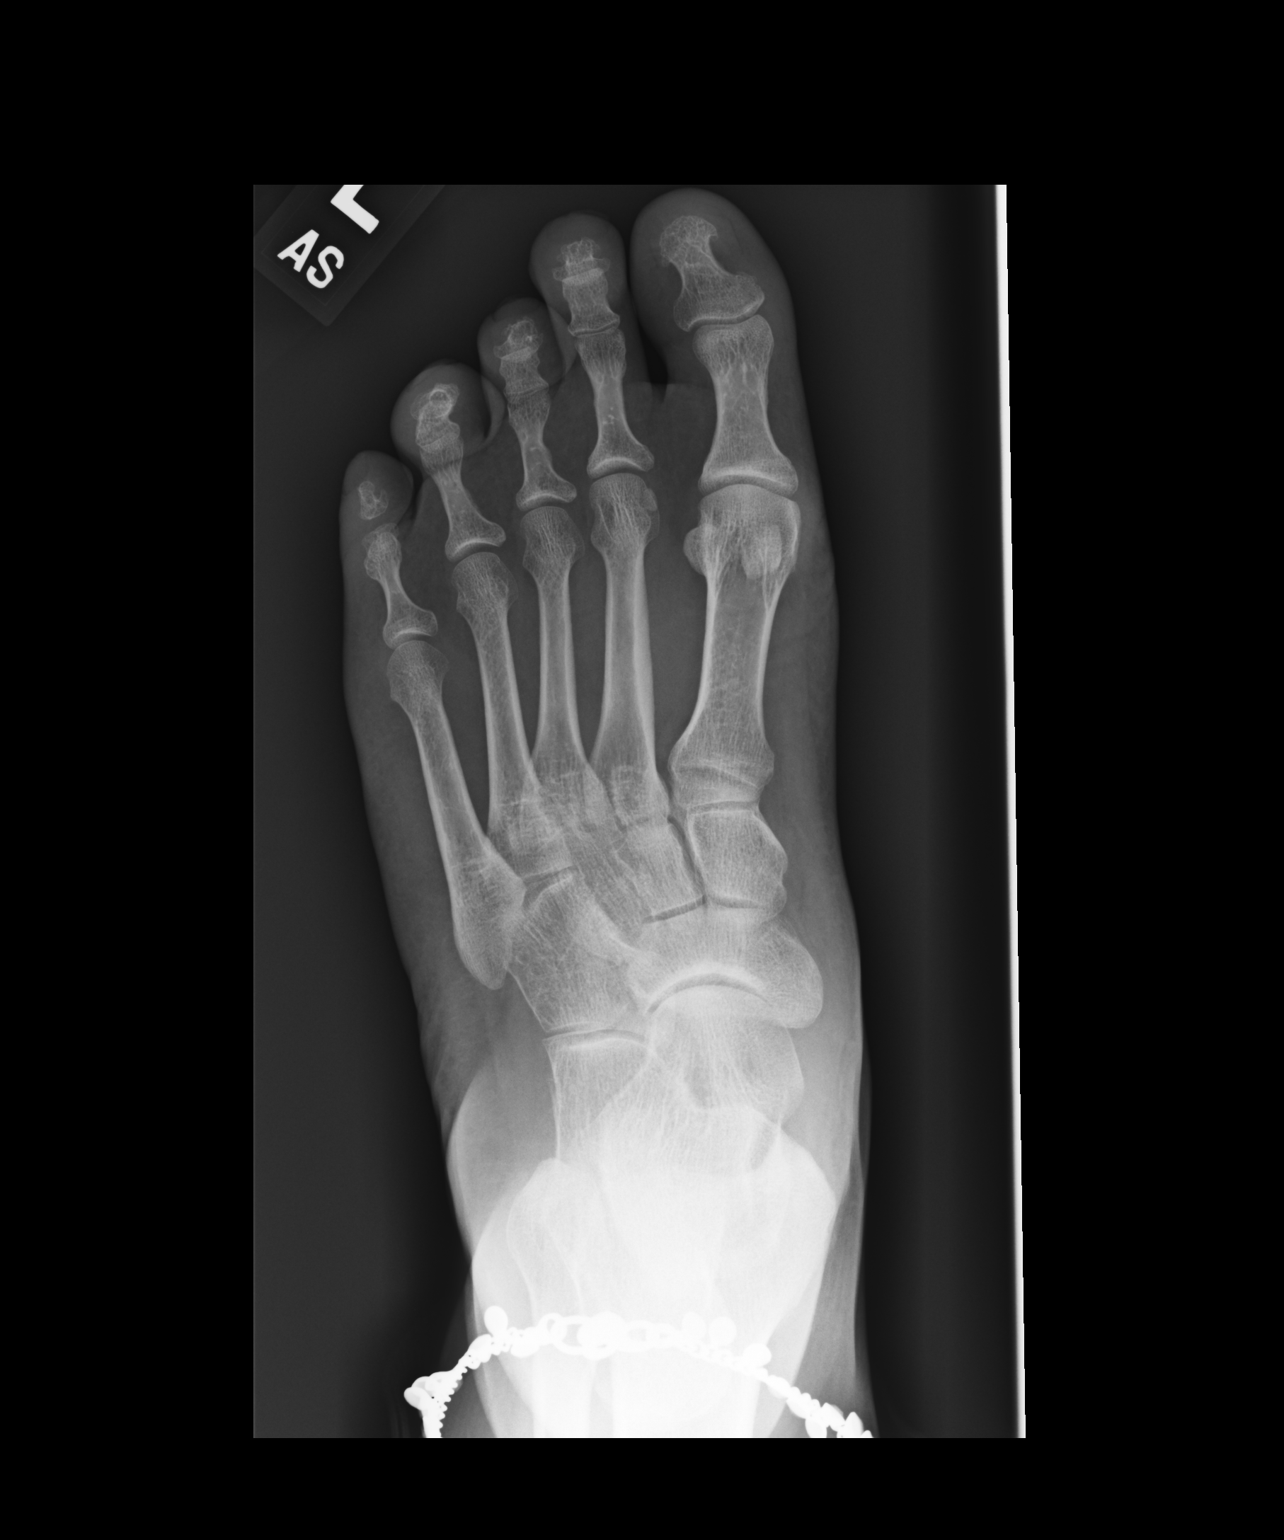

[dg foot complete left (2 of 3)]
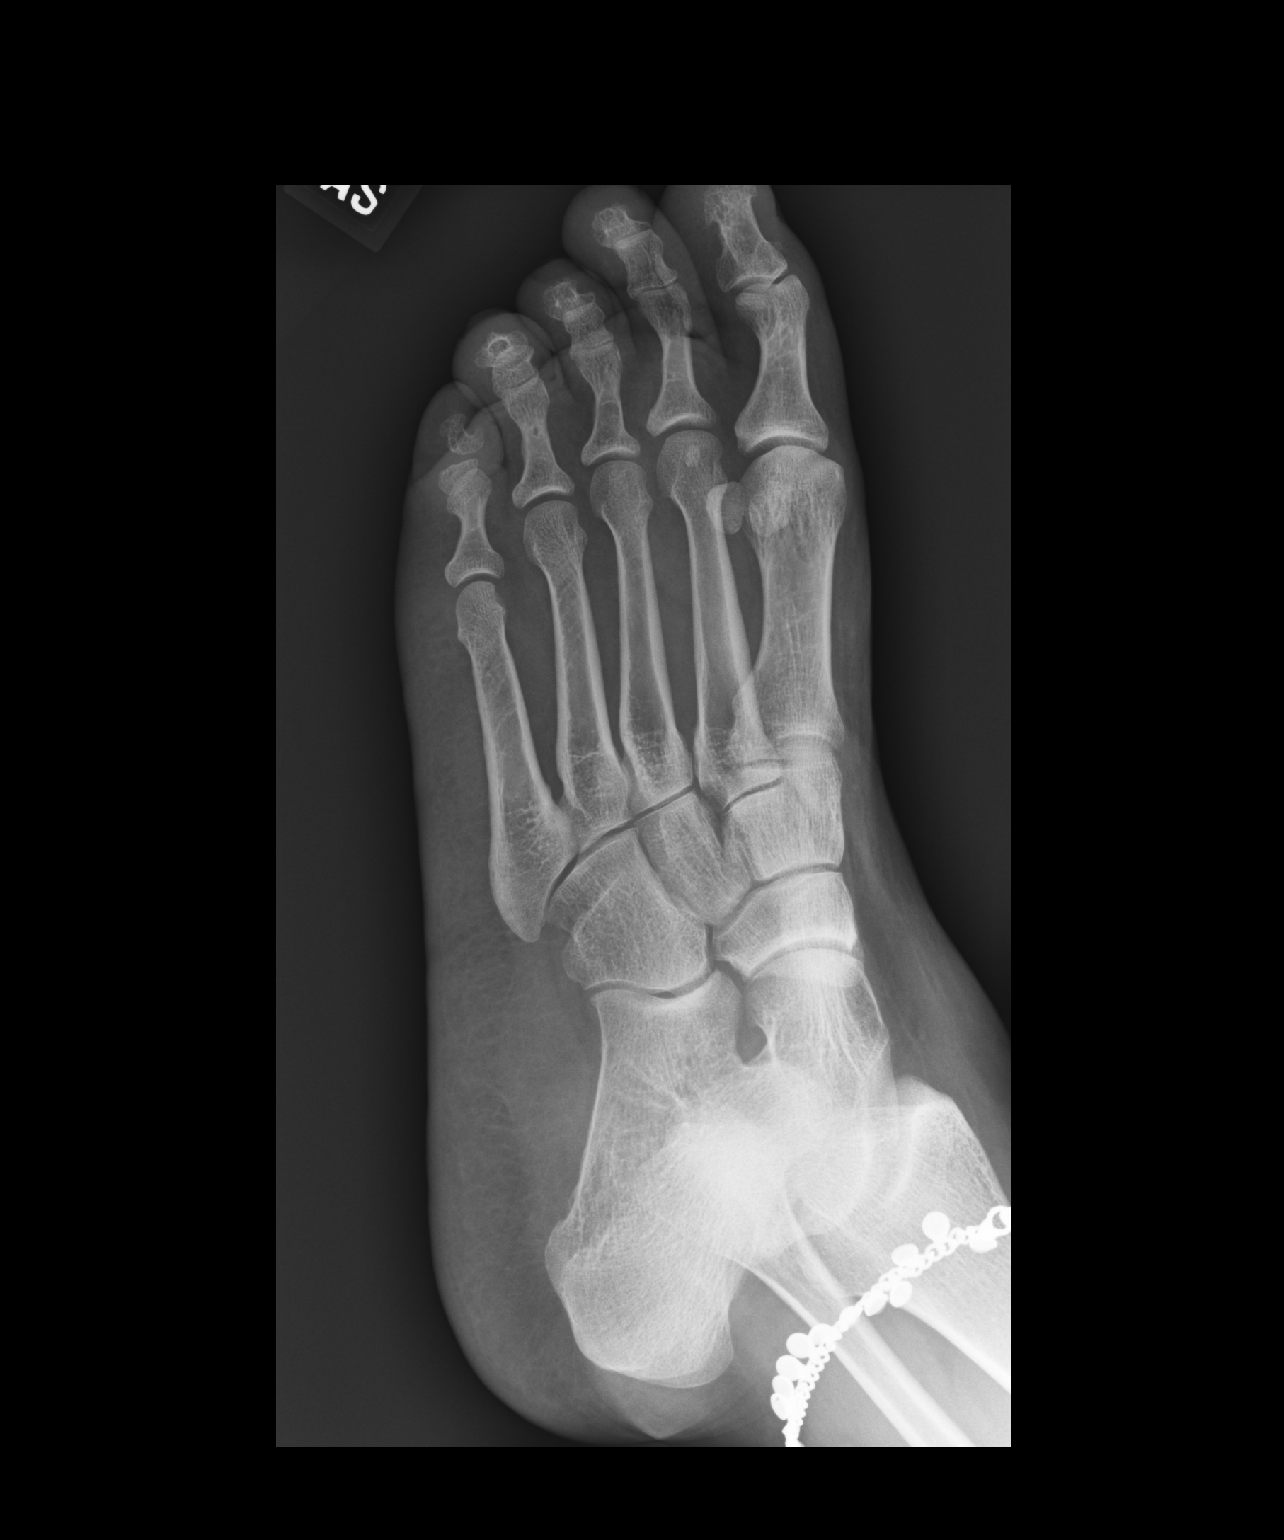

[dg foot complete left (3 of 3)]
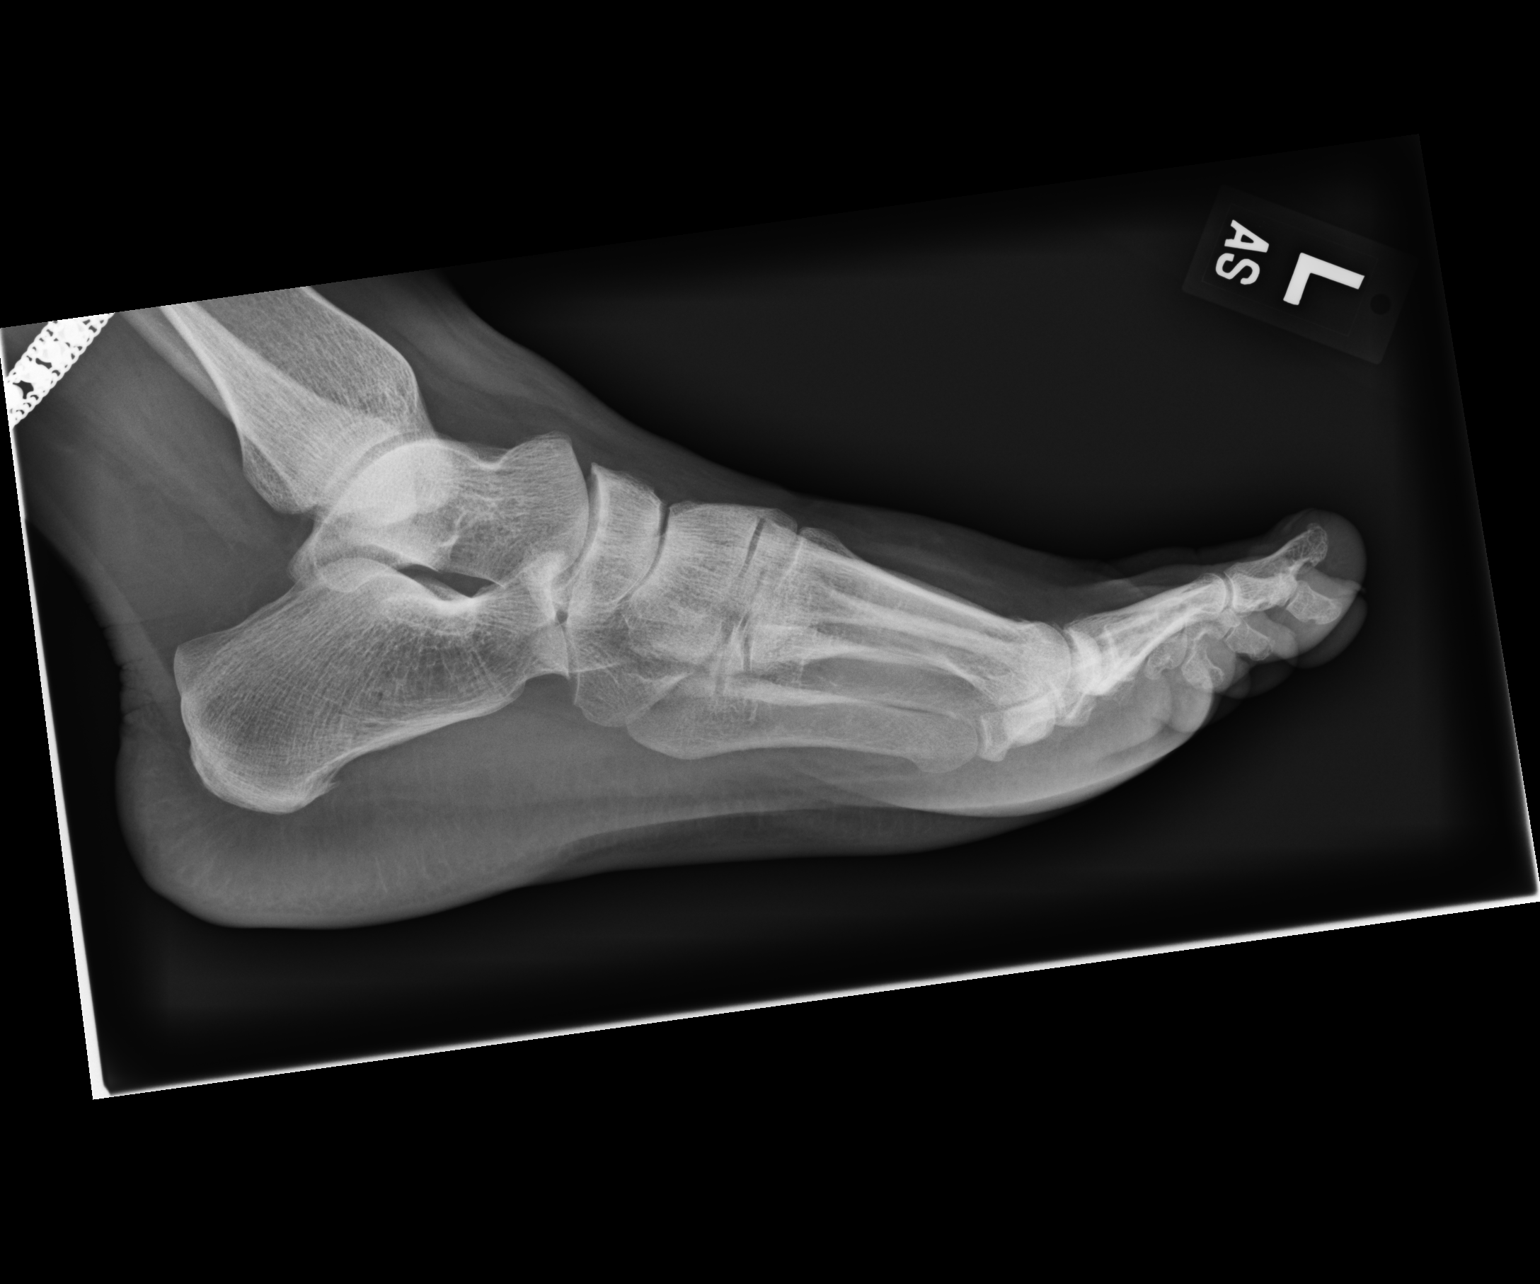

[3 of 3 positions shown; findings below may reference images not displayed]

FINDINGS: Frontal, oblique, and lateral views were obtained. There is no
fracture or dislocation. Joint spaces appear normal. No erosive
change. The fifth middle phalanx is somewhat hypoplastic, an
apparent anatomic variant.
IMPRESSION: No fracture or dislocation.  No evident arthropathy.

## 2021-08-10 DIAGNOSIS — I8312 Varicose veins of left lower extremity with inflammation: Secondary | ICD-10-CM | POA: Diagnosis not present

## 2021-08-31 DIAGNOSIS — Z111 Encounter for screening for respiratory tuberculosis: Secondary | ICD-10-CM | POA: Diagnosis not present

## 2021-09-03 DIAGNOSIS — Z111 Encounter for screening for respiratory tuberculosis: Secondary | ICD-10-CM | POA: Diagnosis not present

## 2021-09-14 ENCOUNTER — Telehealth: Payer: Self-pay | Admitting: *Deleted

## 2021-09-14 NOTE — Telephone Encounter (Signed)
Pt called to let PCP know that FMLA will be sent to her via fax.  The FMLA is to allow her to go to visits with her mom [name] - [phone]) who was recently in the hospital. Jone Baseman, CMA

## 2021-09-18 NOTE — Telephone Encounter (Signed)
Have not received forms  Dana Allan, MD [facility]

## 2021-10-17 ENCOUNTER — Telehealth: Payer: 59 | Admitting: Physician Assistant

## 2021-10-17 NOTE — Progress Notes (Signed)
The patient no-showed for appointment despite this provider sending direct link, reaching out via phone with no response and waiting for at least 10 minutes from appointment time for patient to join. They will be marked as a NS for this appointment/time.  ? ?Denean Pavon Cody Gertrude Bucks, PA-C ? ? ? ?

## 2021-10-19 ENCOUNTER — Other Ambulatory Visit: Payer: Self-pay

## 2021-10-19 ENCOUNTER — Telehealth (INDEPENDENT_AMBULATORY_CARE_PROVIDER_SITE_OTHER): Payer: 59 | Admitting: Family Medicine

## 2021-10-19 ENCOUNTER — Other Ambulatory Visit (HOSPITAL_COMMUNITY): Payer: Self-pay

## 2021-10-19 DIAGNOSIS — B342 Coronavirus infection, unspecified: Secondary | ICD-10-CM

## 2021-10-19 MED ORDER — BENZONATATE 200 MG PO CAPS
200.0000 mg | ORAL_CAPSULE | Freq: Two times a day (BID) | ORAL | 0 refills | Status: DC | PRN
  Filled 2021-10-19: qty 20, 10d supply, fill #0

## 2021-10-19 NOTE — Progress Notes (Signed)
[facility] Telemedicine Visit  Patient consented to have virtual visit and was identified by name and date of birth. Method of visit: Telephone  Encounter participants: Patient: Christine Cervantes - located at home Provider: Dana Allan - located at work Others (if applicable): none  Chief Complaint: COVID  HPI: Tested positive for COVID last Thursday.  Feeling a better except for continued cough  and chest pain. Pain only occurs with cough. Post tussive NBNB emesis.   Denies any recent fevers, productive cough, shortness of breath, nausea, or diarrhea.  Scheduled to go back to work on Friday but unsure if will be able to tolerate N95 with cough.   ROS: per HPI  Pertinent PMHx:  HTN  Exam:  There were no vitals taken for this visit.  Respiratory: Able to speak in full sentences without pause.    Assessment/Plan:  Coronavirus infection Symptomatic management Tessalon pearls 200 mg BID  Continue to remain active Note for work provided Strict return precautions provided Follow up with PCP if no improvemenmt    Time spent during visit with patient: 15 minutes

## 2021-10-21 ENCOUNTER — Telehealth: Payer: Self-pay | Admitting: Family Medicine

## 2021-10-21 NOTE — Telephone Encounter (Signed)
Patient called stating she is needing  a doctors note stating she is okay to return back to work. She is needing the release date to be for tomorrow (10/22/2021). She is needing it urgently due to her not being able to go back to work. She though FMLA could release her. Please advise. Thanks!

## 2021-10-23 ENCOUNTER — Encounter: Payer: Self-pay | Admitting: Family Medicine

## 2021-10-23 NOTE — Assessment & Plan Note (Deleted)
Secondary to COVID. Symptomatic management Tessalon pearls 200 mg BID  Continue to remain active Note for work provided Strict return precautions provided Follow up with PCP if no improvemenmt

## 2021-10-23 NOTE — Assessment & Plan Note (Signed)
Symptomatic management Tessalon pearls 200 mg BID  Continue to remain active Note for work provided Strict return precautions provided Follow up with PCP if no improvemenmt

## 2021-10-24 ENCOUNTER — Other Ambulatory Visit: Payer: Self-pay | Admitting: Family Medicine

## 2021-10-24 NOTE — Telephone Encounter (Signed)
sent 

## 2021-10-26 ENCOUNTER — Ambulatory Visit (INDEPENDENT_AMBULATORY_CARE_PROVIDER_SITE_OTHER): Payer: 59 | Admitting: Family Medicine

## 2021-10-26 ENCOUNTER — Other Ambulatory Visit: Payer: Self-pay

## 2021-10-26 VITALS — BP 127/94 | HR 87 | Ht 64.0 in | Wt 214.2 lb

## 2021-10-26 DIAGNOSIS — U099 Post covid-19 condition, unspecified: Secondary | ICD-10-CM

## 2021-10-26 DIAGNOSIS — R052 Subacute cough: Secondary | ICD-10-CM

## 2021-10-26 NOTE — Progress Notes (Signed)
° °  SUBJECTIVE:   CHIEF COMPLAINT / HPI:    Christine Cervantes is a 36 y.o. female here for ongoing cough with productive white sputum since being diagnosed with COVID on 10/12/2021.  She states that she is physically okay but the cough is lingering.  Tessalon Perles help with the cough.  Denies hemoptysis, fever, shortness of breath, chest pain.  She would like to return to work.  She works in [employer].    PERTINENT  PMH / PSH: reviewed and updated as appropriate   OBJECTIVE:   BP (!) 127/94    Pulse 87    Ht 5\' 4"  (1.626 m)    Wt 214 lb 4 oz (97.2 kg)    LMP 09/27/2021    SpO2 100%    BMI 36.78 kg/m    GEN: pleasant well appearing female, in no acute distress  CV: regular rate and rhythm RESP: no increased work of breathing, clear to ascultation bilaterally, productive cough present on exam SKIN: warm, dry   ASSESSMENT/PLAN:   Cough Patient is a 36 year old female who was diagnosed with COVID about 2 to 3 weeks ago who presents for ongoing productive cough.  Suspect cough secondary to COVID sequelae.  Medications reviewed and patient has no known medications that could cause cough.  Discussed this with patient and she voiced understanding.  Red flag symptoms reviewed.  Ladona Ridgel are working for her.  She does not seem to be bothered by the cough.  She would like to return to work.  Work note provided at patient's request.   Lyndee Hensen, DO PGY-3, [facility] 10/28/2021

## 2021-10-26 NOTE — Patient Instructions (Signed)
Your cough may linger for a few weeks after having COVID.  If you start having a fever or shortness of breath seek urgent medical care.  Take Care,  Dr. Rachael Darby

## 2021-10-28 ENCOUNTER — Encounter: Payer: Self-pay | Admitting: Family Medicine

## 2021-11-08 ENCOUNTER — Telehealth: Payer: Self-pay | Admitting: *Deleted

## 2021-11-08 NOTE — Telephone Encounter (Signed)
Pt calling in wanting an xray order sent in for her post covid cough. Some blood is coming out when she does cough and chest pain. Please advise. French Kendra Bruna Potter, CMA

## 2021-11-09 NOTE — Telephone Encounter (Signed)
Please call her to schedule for an appointment

## 2021-11-10 NOTE — Telephone Encounter (Signed)
LVM for pt to call office to let her know that she will need an appointment. If she calls back please assist her in getting this scheduled. Will also send MyCHart message.Emaan Gary Zimmerman Rumple, CMA

## 2022-02-07 ENCOUNTER — Encounter: Payer: Self-pay | Admitting: Family Medicine

## 2022-02-07 ENCOUNTER — Ambulatory Visit (INDEPENDENT_AMBULATORY_CARE_PROVIDER_SITE_OTHER): Payer: 59 | Admitting: Family Medicine

## 2022-02-07 VITALS — BP 140/82 | HR 77 | Wt 216.0 lb

## 2022-02-07 DIAGNOSIS — Z6837 Body mass index (BMI) 37.0-37.9, adult: Secondary | ICD-10-CM | POA: Diagnosis not present

## 2022-02-07 DIAGNOSIS — Z020 Encounter for examination for admission to educational institution: Secondary | ICD-10-CM

## 2022-02-07 DIAGNOSIS — I10 Essential (primary) hypertension: Secondary | ICD-10-CM | POA: Diagnosis not present

## 2022-02-07 DIAGNOSIS — Z021 Encounter for pre-employment examination: Secondary | ICD-10-CM

## 2022-02-07 MED ORDER — HYDROCHLOROTHIAZIDE 25 MG PO TABS: 25.0000 mg | ORAL_TABLET | Freq: Every day | ORAL | 3 refills | Status: DC

## 2022-02-07 NOTE — Progress Notes (Signed)
? ? ?  SUBJECTIVE:  ? ?CHIEF COMPLAINT / HPI: discuss weight, blood pressure ? ?Weight management ?Reports having difficult time trying to loose weight.  Tried diet and exercise.  Had liposuction around waistline to improve weight.  Reports increasing weight around hips and thighs.  Works nights so difficult to maintain active lifestyle while looking after kids. Has become frustrating and would like to start medication to help. ? ?HTN ?Without medication for 3 weeks.  Denies any visual disturbances, chest pain, shortness of breath or lower extremity edema.   ? 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 ?PERTINENT  PMH / PSH:  ?HTN ?Obesity class 2 ? ?OBJECTIVE:  ? ?BP 140/82   Pulse 77   Wt 216 lb (98 kg)   LMP 01/16/2022 (Exact Date)   Breastfeeding No   BMI 37.08 kg/m?   ? ?General: Alert, no acute distress ?Cardio: Normal S1 and S2, RRR, no r/m/g ?Pulm: CTAB, normal work of breathing ?Abdomen: Bowel sounds normal. Abdomen soft and non-tender.  ?Extremities: No peripheral edema.  ? ? ?ASSESSMENT/PLAN:  ? ?Essential hypertension ?Repeat BP continues to be elevated.  Patient without medication for 3 weeks ?Increase Hydrochlorothiazide 25 mg daily ?Follow up next week for BMet ?Follow up in 2 weeks ?Strict return precautions provided ? ?OBESITY ?No history of Medullary thyroid cancer, pancreatitis ?Discussed benefits of nutrition ?Declined formal nutrition referral ?Start Wegovy 0.25 mg weekly ?Follow up in 4 weeks ? ?Encounter for school examination ?TB Quant and Varicella titre today ?Will complete form when results reviewed ? ?  ? ? ?Carollee Leitz, MD ?[place]  ?

## 2022-02-07 NOTE — Patient Instructions (Signed)
Thank you for coming to see me today. It was a pleasure. Today we talked about:  ? ?Increase  Hydrochlorothiazide to 50 mg daily ? ?Follow up next week for blood work ? ?Please follow-up with PCP in 3 weeks ? ?If you have any questions or concerns, please do not hesitate to call the office at [phone]. ? ?Best,  ? ?Carollee Leitz, MD   ?

## 2022-02-09 MED ORDER — SEMAGLUTIDE-WEIGHT MANAGEMENT 0.25 MG/0.5ML ~~LOC~~ SOAJ: 0.2500 mg | SUBCUTANEOUS | 0 refills | Status: DC

## 2022-02-11 ENCOUNTER — Encounter: Payer: Self-pay | Admitting: Family Medicine

## 2022-02-11 DIAGNOSIS — Z020 Encounter for examination for admission to educational institution: Secondary | ICD-10-CM | POA: Insufficient documentation

## 2022-02-11 NOTE — Assessment & Plan Note (Signed)
TB Quant and Varicella titre today ?Will complete form when results reviewed ? ?

## 2022-02-11 NOTE — Assessment & Plan Note (Signed)
Repeat BP continues to be elevated.  Patient without medication for 3 weeks ?Increase Hydrochlorothiazide 25 mg daily ?Follow up next week for BMet ?Follow up in 2 weeks ?Strict return precautions provided ?

## 2022-02-11 NOTE — Assessment & Plan Note (Signed)
No history of Medullary thyroid cancer, pancreatitis ?Discussed benefits of nutrition ?Declined formal nutrition referral ?Start Wegovy 0.25 mg weekly ?Follow up in 4 weeks ?

## 2022-02-13 ENCOUNTER — Other Ambulatory Visit: Payer: 59

## 2022-02-16 ENCOUNTER — Other Ambulatory Visit: Payer: 59

## 2022-02-16 ENCOUNTER — Ambulatory Visit: Payer: 59

## 2022-02-16 DIAGNOSIS — Z021 Encounter for pre-employment examination: Secondary | ICD-10-CM | POA: Diagnosis not present

## 2022-02-16 DIAGNOSIS — I10 Essential (primary) hypertension: Secondary | ICD-10-CM | POA: Diagnosis not present

## 2022-02-16 NOTE — Progress Notes (Signed)
Patient presents in nurse clinic for school vaccines.  ? ?Patient has future orders for Varicella.  ? ?Tdap is UTD. MMR is UTD. Hep B UTD. ? ?Patient needs TB skin test. However, this can not be done on Thursdays.  ? ?Patient rescheduled for 5/15.  ? ?Patient dropped off at the lab for Varicella Titers.  ? ?Will give vaccine on 5/15 if needed.  ?

## 2022-02-17 ENCOUNTER — Telehealth: Payer: Self-pay | Admitting: *Deleted

## 2022-02-17 NOTE — Telephone Encounter (Signed)
Patient wants to know if she should do anything about her potassium results. Jone Baseman, CMA ? ?

## 2022-02-20 NOTE — Telephone Encounter (Signed)
Called patient no answer.  Unable to LVM.  If she returns call please let her know that she can eat bananas, kiwis to increase potassium.  Can have one daily for 1 week than as needed.  ? ?Carollee Leitz, MD ?Family Medicine Residency  ?

## 2022-02-21 ENCOUNTER — Ambulatory Visit (INDEPENDENT_AMBULATORY_CARE_PROVIDER_SITE_OTHER): Payer: 59

## 2022-02-21 ENCOUNTER — Encounter: Payer: Self-pay | Admitting: Family Medicine

## 2022-02-21 DIAGNOSIS — Z111 Encounter for screening for respiratory tuberculosis: Secondary | ICD-10-CM

## 2022-02-21 NOTE — Telephone Encounter (Signed)
Patient notified during nurse visit 02-21-22.  Royalty Fakhouri,CMA ? ?

## 2022-02-21 NOTE — Progress Notes (Signed)
Patient presents in nurse clinic for PPD placement.  ? ?PPD placed left arm without complication.  ? ?Patient to return on 5/18 to have site read. ? ? ?

## 2022-02-22 LAB — BASIC METABOLIC PANEL
BUN/Creatinine Ratio: 13 (ref 9–23)
BUN: 11 mg/dL (ref 6–20)
CO2: 27 mmol/L (ref 20–29)
Calcium: 9.1 mg/dL (ref 8.7–10.2)
Chloride: 102 mmol/L (ref 96–106)
Creatinine, Ser: 0.86 mg/dL (ref 0.57–1.00)
Glucose: 93 mg/dL (ref 70–99)
Potassium: 3.2 mmol/L — ABNORMAL LOW (ref 3.5–5.2)
Sodium: 142 mmol/L (ref 134–144)
eGFR: 89 mL/min/{1.73_m2} (ref >59)

## 2022-02-22 LAB — QUANTIFERON-TB GOLD PLUS
QuantiFERON Mitogen Value: >10 IU/mL
QuantiFERON Nil Value: 0 IU/mL
QuantiFERON TB1 Ag Value: 0.01 IU/mL
QuantiFERON TB2 Ag Value: 0 IU/mL
QuantiFERON-TB Gold Plus: NEGATIVE

## 2022-02-22 LAB — VARICELLA ZOSTER ANTIBODY, IGG: Varicella zoster IgG: 1903 index (ref >165)

## 2022-02-23 ENCOUNTER — Ambulatory Visit: Payer: 59

## 2022-02-23 DIAGNOSIS — Z111 Encounter for screening for respiratory tuberculosis: Secondary | ICD-10-CM

## 2022-02-23 LAB — TB SKIN TEST
Induration: 0 mm
TB Skin Test: NEGATIVE

## 2022-02-23 NOTE — Progress Notes (Signed)
PPD Reading Note PPD read and results entered in EpicCare. Result: 0 mm induration. Interpretation: Negative Allergic reaction: No  

## 2022-02-23 NOTE — Progress Notes (Signed)
TB quant negative.  I'm not sure why she is having the PPD.  I can complete the form now that the blood work results have come back and place it in my box tomorrow.

## 2022-02-25 ENCOUNTER — Other Ambulatory Visit: Payer: Self-pay | Admitting: Family Medicine

## 2022-02-25 ENCOUNTER — Telehealth: Payer: Self-pay | Admitting: Family Medicine

## 2022-02-25 ENCOUNTER — Inpatient Hospital Stay (HOSPITAL_COMMUNITY)
Admission: AD | Admit: 2022-02-25 | Discharge: 2022-02-25 | Discharge status: Against Medical Advice | Payer: 59 | Attending: Obstetrics and Gynecology | Admitting: Obstetrics and Gynecology

## 2022-02-25 DIAGNOSIS — D509 Iron deficiency anemia, unspecified: Secondary | ICD-10-CM

## 2022-02-25 DIAGNOSIS — R350 Frequency of micturition: Secondary | ICD-10-CM

## 2022-02-25 DIAGNOSIS — R110 Nausea: Secondary | ICD-10-CM

## 2022-02-25 DIAGNOSIS — I10 Essential (primary) hypertension: Secondary | ICD-10-CM

## 2022-02-25 NOTE — Telephone Encounter (Signed)
Received after hours page at 4:21 am. Called back number x 2 with no answer and voicemail full.

## 2022-02-25 NOTE — Telephone Encounter (Signed)
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 Feels horrible. Has been urinating all night. Feels light headed and sick to her stomach. Wondering if she may be pregnant or have a UTI. LMP 5/2. Requesting orders for blood draw and urinalysis. Discussed urgent care and she states she leaves work at 5:30am and returns at 5:30pm and would rather not go to [facility] or ED because she would be drained for work. Discussed with her that I will place future orders but she likely wouldn't be able to get them done until clinic opens. Recommend being seen for evaluation.

## 2022-02-25 NOTE — MAU Note (Signed)
Registration says pt left 

## 2022-03-01 ENCOUNTER — Other Ambulatory Visit (HOSPITAL_COMMUNITY): Payer: Self-pay

## 2022-03-01 ENCOUNTER — Encounter: Payer: Self-pay | Admitting: Family Medicine

## 2022-03-01 ENCOUNTER — Ambulatory Visit (INDEPENDENT_AMBULATORY_CARE_PROVIDER_SITE_OTHER): Payer: 59 | Admitting: Family Medicine

## 2022-03-01 ENCOUNTER — Telehealth: Payer: Self-pay | Admitting: Family Medicine

## 2022-03-01 VITALS — BP 128/94 | HR 85 | Wt 217.5 lb

## 2022-03-01 DIAGNOSIS — R300 Dysuria: Secondary | ICD-10-CM

## 2022-03-01 DIAGNOSIS — N3000 Acute cystitis without hematuria: Secondary | ICD-10-CM | POA: Diagnosis not present

## 2022-03-01 DIAGNOSIS — I10 Essential (primary) hypertension: Secondary | ICD-10-CM | POA: Diagnosis not present

## 2022-03-01 DIAGNOSIS — Z3200 Encounter for pregnancy test, result unknown: Secondary | ICD-10-CM | POA: Diagnosis not present

## 2022-03-01 LAB — POCT URINE PREGNANCY: Preg Test, Ur: NEGATIVE

## 2022-03-01 LAB — POCT UA - MICROSCOPIC ONLY: WBC, Ur, HPF, POC: NONE SEEN (ref 0–5)

## 2022-03-01 MED ORDER — CEPHALEXIN 500 MG PO CAPS
500.0000 mg | ORAL_CAPSULE | Freq: Two times a day (BID) | ORAL | 0 refills | Status: DC
  Filled 2022-03-01: qty 14, 7d supply, fill #0

## 2022-03-01 NOTE — Progress Notes (Signed)
    SUBJECTIVE:   CHIEF COMPLAINT / HPI:   UTI: dysuria, urgency, frequency x 10 days. No fever, woke up with chills, no flank pain. On azo. Unable to get UA today as a result. Will send for urine culture.   Possible pregnancy: patient has felt a little dizzy and fatigued. She had unprotected sex 2 weeks ago, which was precisely 2 weeks after her LMP. She recognizes it is early, but the symptoms she is feeling today are mild, but very similar to her last pregnancy. She wants to have a serum HCG test for this reason. As it may affect treatment of above, will obtain labs.   PERTINENT  PMH / PSH:   OBJECTIVE:   BP (!) 128/94   Pulse 85   Wt 217 lb 8 oz (98.7 kg)   LMP 02/08/2022   SpO2 99%   BMI 37.33 kg/m   Nursing note and vitals reviewed GEN: Age-appropriate, AA W, resting comfortably in chair, NAD, class II obesity Cardiac: Regular rate and rhythm. Normal S1/S2. No murmurs, rubs, or gallops appreciated. 2+ radial pulses. Lungs: Clear bilaterally to ascultation. No increased WOB, no accessory muscle usage. No w/r/r. Abdomen: Soft, NT, ND.  No CVA tenderness.  Normoactive bowel sounds Neuro: AOx3  Ext: no edema Psych: Pleasant and appropriate  ASSESSMENT/PLAN:   Essential hypertension Reviewed after visit. BP is mildly elevated but close to goal. HCTZ is not ideal tx, but not teratogenic. However, given that patient is having unprotected intercourse, recommend she return to discuss with PCP medications. Let patient know via MyChart message, made follow up appt for Tuesday 10:30 AM.  UTI (urinary tract infection) Unable to use UA given AZO. Urine culture shows staph saprophyticus. Patient was tx empirically with keflex 500 mg BID x7d, which is safe in first trimester of pregnancy.   Possible pregnancy Patient has sx consistent with previous pregnancy, has had unprotected intercourse 2 weeks ago, which was correctly timed for possible pregnancy. Urine pregnancy test obtained today  negative. Serum b HCG negative, however could be too early. Recommend retest in 1 week.     Shirlean Mylar, MD [facility]

## 2022-03-01 NOTE — Patient Instructions (Signed)
It was a pleasure to see you today!  We will get some labs today.  If they are abnormal or we need to do something about them, I will call you.  If they are normal, I will send you a message on MyChart (if it is active) or a letter in the mail.  If you don't hear from [place] in 2 weeks, please call the office  [phone]. IF you have a fever (temp above 100.4*F), flank pain, or worsening abdominal pain, come back sooner For UTI: I am getting a urine culture and will let you know results. In the mean time, take keflex 500 mg twice a day for 7 days   Be Well,  Dr. Leary Roca

## 2022-03-01 NOTE — Telephone Encounter (Signed)
Patient came in checking on the status of her physical form for school that she left with Dr. Clent Ridges earlier this month. States that she needs it by 03/07/22. She would like to be called when it is ready to be picked up please

## 2022-03-01 NOTE — Telephone Encounter (Signed)
Form placed up front for pick up.   A copy was made for batch scanning.   The patient has been made aware.

## 2022-03-02 ENCOUNTER — Encounter: Payer: Self-pay | Admitting: Family Medicine

## 2022-03-02 DIAGNOSIS — I872 Venous insufficiency (chronic) (peripheral): Secondary | ICD-10-CM | POA: Diagnosis not present

## 2022-03-02 LAB — BASIC METABOLIC PANEL
BUN/Creatinine Ratio: 11 (ref 9–23)
BUN: 9 mg/dL (ref 6–20)
CO2: 25 mmol/L (ref 20–29)
Calcium: 9.2 mg/dL (ref 8.7–10.2)
Chloride: 101 mmol/L (ref 96–106)
Creatinine, Ser: 0.83 mg/dL (ref 0.57–1.00)
Glucose: 102 mg/dL — ABNORMAL HIGH (ref 70–99)
Potassium: 3.7 mmol/L (ref 3.5–5.2)
Sodium: 139 mmol/L (ref 134–144)
eGFR: 92 mL/min/{1.73_m2} (ref >59)

## 2022-03-02 LAB — BETA HCG QUANT (REF LAB): hCG Quant: <1 m[IU]/mL

## 2022-03-04 ENCOUNTER — Encounter: Payer: Self-pay | Admitting: Family Medicine

## 2022-03-04 DIAGNOSIS — N390 Urinary tract infection, site not specified: Secondary | ICD-10-CM | POA: Insufficient documentation

## 2022-03-04 DIAGNOSIS — Z3200 Encounter for pregnancy test, result unknown: Secondary | ICD-10-CM | POA: Insufficient documentation

## 2022-03-04 LAB — URINE CULTURE

## 2022-03-04 NOTE — Assessment & Plan Note (Addendum)
Reviewed after visit. BP is mildly elevated but close to goal. HCTZ is not ideal tx, but not teratogenic. However, given that patient is having unprotected intercourse, recommend she return to discuss with PCP medications. Let patient know via MyChart message, made follow up appt for Tuesday 10:30 AM.

## 2022-03-04 NOTE — Assessment & Plan Note (Signed)
Patient has sx consistent with previous pregnancy, has had unprotected intercourse 2 weeks ago, which was correctly timed for possible pregnancy. Urine pregnancy test obtained today negative. Serum b HCG negative, however could be too early. Recommend retest in 1 week.

## 2022-03-04 NOTE — Assessment & Plan Note (Signed)
Unable to use UA given AZO. Urine culture shows staph saprophyticus. Patient was tx empirically with keflex 500 mg BID x7d, which is safe in first trimester of pregnancy.

## 2022-03-07 ENCOUNTER — Ambulatory Visit: Payer: 59 | Admitting: Family Medicine

## 2022-03-08 ENCOUNTER — Telehealth: Payer: 59 | Admitting: Physician Assistant

## 2022-03-09 DIAGNOSIS — I83812 Varicose veins of left lower extremities with pain: Secondary | ICD-10-CM | POA: Diagnosis not present

## 2022-03-09 DIAGNOSIS — Z09 Encounter for follow-up examination after completed treatment for conditions other than malignant neoplasm: Secondary | ICD-10-CM | POA: Diagnosis not present

## 2022-03-09 NOTE — Progress Notes (Signed)
Mother trying to do a visit for her daughter through her own chart. Let her know we do not treat minors via e-visit and resources given so she can be evaluated.

## 2022-03-14 ENCOUNTER — Encounter: Payer: Self-pay | Admitting: *Deleted

## 2022-03-28 DIAGNOSIS — F419 Anxiety disorder, unspecified: Secondary | ICD-10-CM | POA: Diagnosis not present

## 2022-08-04 ENCOUNTER — Ambulatory Visit (INDEPENDENT_AMBULATORY_CARE_PROVIDER_SITE_OTHER): Payer: 59

## 2022-08-04 DIAGNOSIS — Z23 Encounter for immunization: Secondary | ICD-10-CM | POA: Diagnosis not present

## 2022-08-04 NOTE — Progress Notes (Signed)
Patient presents to nurse clinic for flu vaccination. Administered in LD, site unremarkable, tolerated injection well.   Cedarius Kersh C Risa Auman, RN   

## 2022-11-20 ENCOUNTER — Other Ambulatory Visit (HOSPITAL_COMMUNITY): Payer: Self-pay

## 2022-11-20 ENCOUNTER — Other Ambulatory Visit (HOSPITAL_BASED_OUTPATIENT_CLINIC_OR_DEPARTMENT_OTHER): Payer: Self-pay

## 2022-11-20 ENCOUNTER — Encounter: Payer: Self-pay | Admitting: Student

## 2022-11-20 ENCOUNTER — Ambulatory Visit (INDEPENDENT_AMBULATORY_CARE_PROVIDER_SITE_OTHER): Payer: Self-pay | Admitting: Student

## 2022-11-20 VITALS — BP 132/98 | HR 78 | Ht 64.0 in | Wt 206.4 lb

## 2022-11-20 DIAGNOSIS — F4321 Adjustment disorder with depressed mood: Secondary | ICD-10-CM

## 2022-11-20 MED ORDER — HYDROXYZINE HCL 10 MG PO TABS
10.0000 mg | ORAL_TABLET | Freq: Every day | ORAL | 0 refills | Status: DC
  Filled 2022-11-20: qty 30, 30d supply, fill #0

## 2022-11-20 MED ORDER — SERTRALINE HCL 50 MG PO TABS
50.0000 mg | ORAL_TABLET | Freq: Every day | ORAL | 3 refills | Status: DC
  Filled 2022-11-20: qty 30, 30d supply, fill #0

## 2022-11-20 MED ORDER — HYDROCHLOROTHIAZIDE 25 MG PO TABS
25.0000 mg | ORAL_TABLET | Freq: Every day | ORAL | 3 refills | Status: DC
Start: 2022-11-20 — End: 2023-11-21
  Filled 2022-11-20: qty 30, 30d supply, fill #0
  Filled 2023-07-05 – 2023-08-28 (×3): qty 30, 30d supply, fill #1

## 2022-11-20 NOTE — Patient Instructions (Signed)
It was great seeing you today.  We are starting you on Zoloft 50 mg daily. It will take 6 weeks for the full effect.  Let me know how you are doing in 4 weeks.   If you have any questions or concerns, please feel free to call the clinic.   Have a wonderful day,  Dr. Orvis Brill [facility] [phone]

## 2022-11-20 NOTE — Assessment & Plan Note (Signed)
Having a lot of life stressors at the moment.  Denies any suicidal ideation or self-harm thoughts. PHQ-9 reviewed, with score of 12 and #9 is 0. Start Zoloft 50 mg daily. Follow-up in 6 weeks to see how Christine Cervantes is doing. Provided with mental health resources

## 2022-11-20 NOTE — Progress Notes (Signed)
    SUBJECTIVE:   CHIEF COMPLAINT / HPI:   Eryn is a very pleasant 37 year old female here to discuss restarting anxiety/depression medication.  When she was pregnant, she took Zoloft which worked well for her.  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  Also has 2 children which keep her very busy.  Feels her symptoms are more related to depression than anxiety.  She has trouble falling asleep at night.  She has been losing weight unintentionally due to not being able to eat as much because of depression.  She has no self-harm thoughts.   PERTINENT  PMH / PSH: Reviewed  OBJECTIVE:   BP (!) 132/98   Pulse 78   Wt 206 lb 6.4 oz (93.6 kg)   SpO2 100%   BMI 35.43 kg/m General: Well-appearing, no distress CV: Regular rate and rhythm Respiratory: Normal work of breathing on room air.  No wheezing or crackles Psychiatric: Pleasant.  Normal mood and affect.  Makes good eye contact.   ASSESSMENT/PLAN:   Depression Having a lot of life stressors at the moment.  Denies any suicidal ideation or self-harm thoughts. PHQ-9 reviewed, with score of 12 and #9 is 0. Start Zoloft 50 mg daily. Follow-up in 6 weeks to see how she is doing. Provided with mental health resources   Recommend completing Pap smear and follow-up given that she is due.   Orvis Brill, [place]

## 2022-11-20 NOTE — Progress Notes (Deleted)
    SUBJECTIVE:   CHIEF COMPLAINT / HPI:   ***  PERTINENT  PMH / PSH: ***  OBJECTIVE:   BP (!) 132/98   Pulse 78   Wt 206 lb 6.4 oz (93.6 kg)   SpO2 100%   BMI 35.43 kg/m  ***   ASSESSMENT/PLAN:   No problem-specific Assessment & Plan notes found for this encounter.     Orvis Brill, [place]    {    This will disappear when note is signed, click to select method of visit    :1}

## 2023-02-28 ENCOUNTER — Telehealth: Payer: Self-pay | Admitting: Student

## 2023-02-28 ENCOUNTER — Other Ambulatory Visit: Payer: Self-pay | Admitting: Student

## 2023-02-28 DIAGNOSIS — Z111 Encounter for screening for respiratory tuberculosis: Secondary | ICD-10-CM

## 2023-02-28 NOTE — Telephone Encounter (Signed)
Patient is calling and would like for Dr. Melissa Noon to place a lab order to have Quantam Gold blood work. Patient needs this for her employer.   Please call patient when orders have been placed so that she can schedule.

## 2023-02-28 NOTE — Telephone Encounter (Signed)
Patient scheduled for tomorrow

## 2023-02-28 NOTE — Telephone Encounter (Signed)
Once order is placed please let me know and I will get her scheduled for a lab visit.

## 2023-02-28 NOTE — Telephone Encounter (Signed)
Patient is calling and would like an order placed for quantam gold test. She is needing this for an employer. Please call patient when order has been placed so she can schedule.

## 2023-02-28 NOTE — Progress Notes (Signed)
Order for quant gold placed.

## 2023-03-01 ENCOUNTER — Other Ambulatory Visit: Payer: Medicaid Other

## 2023-03-01 DIAGNOSIS — Z111 Encounter for screening for respiratory tuberculosis: Secondary | ICD-10-CM

## 2023-03-03 LAB — QUANTIFERON-TB GOLD PLUS

## 2023-03-04 ENCOUNTER — Emergency Department (HOSPITAL_BASED_OUTPATIENT_CLINIC_OR_DEPARTMENT_OTHER)
Admission: EM | Admit: 2023-03-04 | Discharge: 2023-03-04 | Disposition: A | Discharge status: Home / Self Care | Payer: Medicaid Other | Attending: Emergency Medicine | Admitting: Emergency Medicine

## 2023-03-04 ENCOUNTER — Encounter (HOSPITAL_BASED_OUTPATIENT_CLINIC_OR_DEPARTMENT_OTHER): Payer: Self-pay | Admitting: Emergency Medicine

## 2023-03-04 DIAGNOSIS — M545 Low back pain, unspecified: Secondary | ICD-10-CM | POA: Diagnosis present

## 2023-03-04 DIAGNOSIS — Z91040 Latex allergy status: Secondary | ICD-10-CM | POA: Insufficient documentation

## 2023-03-04 LAB — URINALYSIS, ROUTINE W REFLEX MICROSCOPIC
Bilirubin Urine: NEGATIVE
Glucose, UA: NEGATIVE mg/dL
Hgb urine dipstick: NEGATIVE
Ketones, ur: NEGATIVE mg/dL
Leukocytes,Ua: NEGATIVE
Nitrite: NEGATIVE
Specific Gravity, Urine: 1.027 (ref 1.005–1.030)
pH: 6 (ref 5.0–8.0)

## 2023-03-04 LAB — PREGNANCY, URINE: Preg Test, Ur: NEGATIVE

## 2023-03-04 MED ORDER — METHOCARBAMOL 500 MG PO TABS: 500.0000 mg | ORAL_TABLET | Freq: Two times a day (BID) | ORAL | 0 refills | Status: DC

## 2023-03-04 MED ORDER — KETOROLAC TROMETHAMINE 30 MG/ML IJ SOLN
30.0000 mg | Freq: Once | INTRAMUSCULAR | Status: AC
  Administered 2023-03-04: 30 mg via INTRAMUSCULAR

## 2023-03-04 MED ORDER — NAPROXEN 500 MG PO TABS: 500.0000 mg | ORAL_TABLET | Freq: Two times a day (BID) | ORAL | 0 refills | Status: DC

## 2023-03-04 NOTE — ED Notes (Signed)
Reviewed AVS/discharge instruction with patient. Time allotted for and all questions answered. Patient is agreeable for d/c and escorted to ed exit by staff.  

## 2023-03-04 NOTE — ED Provider Notes (Signed)
[facility]  Provider Note  CSN: [id number] Arrival date & time: 03/04/23 0022  History Chief Complaint  Patient presents with   Back Pain    Christine Cervantes is a 37 y.o. 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 Hurts some every day but worse on days after she has worked. No falls or injuries. At one point the pain was radiating down her L leg but not hurting her there tonight. She reports some urinary urgency recently, notes she has been drinking more water.    Home Medications Prior to Admission medications   Medication Sig Start Date End Date Taking? Authorizing Provider  methocarbamol (ROBAXIN) 500 MG tablet Take 1 tablet (500 mg total) by mouth 2 (two) times daily. 03/04/23  Yes Pollyann Savoy, MD  naproxen (NAPROSYN) 500 MG tablet Take 1 tablet (500 mg total) by mouth 2 (two) times daily. 03/04/23  Yes Pollyann Savoy, MD  acetaminophen (TYLENOL) 325 MG tablet Take 650 mg by mouth every 6 (six) hours as needed for mild pain or headache. Patient not taking: Reported on 11/20/2022    [provider]  Ascorbic Acid (VITAMIN C PO) Take 1 tablet by mouth daily.    [provider]  hydrochlorothiazide (HYDRODIURIL) 25 MG tablet Take 1 tablet (25 mg total) by mouth daily. 11/20/22   Dameron, Nolberto Hanlon, DO  hydrOXYzine (ATARAX) 10 MG tablet Take 1 tablet (10 mg total) by mouth at bedtime. 11/20/22   Dameron, Nolberto Hanlon, DO  sertraline (ZOLOFT) 50 MG tablet Take 1 tablet (50 mg total) by mouth daily. 11/20/22   Dameron, Nolberto Hanlon, DO  VITAMIN E PO Take 1 tablet by mouth daily.    [provider]  Ferrous Sulfate (IRON PO) Take by mouth daily. Liquid OTC iron not sure of dose  01/23/20  [provider]     Allergies    Latex   Review of Systems   Review of Systems Please see HPI for pertinent positives and  negatives  Physical Exam BP (!) 139/97   Pulse 74   Temp 98.2 F (36.8 C) (Oral)   Resp 18   LMP 02/25/2023   SpO2 100%   Physical Exam Vitals and nursing note reviewed.  HENT:     Head: Normocephalic.     Nose: Nose normal.  Eyes:     Extraocular Movements: Extraocular movements intact.  Pulmonary:     Effort: Pulmonary effort is normal.  Musculoskeletal:        General: Tenderness (diffuse lumbar area, no focal bony tenderness) present. Normal range of motion.     Cervical back: Neck supple.  Skin:    Findings: No rash (on exposed skin).  Neurological:     Mental Status: She is alert and oriented to person, place, and time.     Sensory: No sensory deficit.     Motor: No weakness.  Psychiatric:        Mood and Affect: Mood normal.     ED Results / Procedures / Treatments   EKG None  Procedures Procedures  Medications Ordered in the ED Medications  ketorolac (TORADOL) 30 MG/ML injection 30 mg (30 mg Intramuscular Given 03/04/23 0103)    Initial Impression and Plan  Patient here with MSK low back pain, ongoing for several weeks. No red flags for acute cord compression. She has tried heat,  ice, OTC motrin and APAP at home. Some vague urinary symptoms, but no dysuria. Will give toradol for pain, check UA.   ED Course   Clinical Course as of 03/04/23 0111  Sun Mar 04, 2023  0059 UA is neg for infection [CS]  0106 HCG is neg.  [CS]  0109 Plan discharge with Rx for Naprosyn and Robaxin. Advised PCP follow up, RTED for any other concerns.   [CS]    Clinical Course User Index [CS] Pollyann Savoy, MD     MDM Rules/Calculators/A&P Medical Decision Making Problems Addressed: Acute bilateral low back pain without sciatica: acute illness or injury  Amount and/or Complexity of Data Reviewed Labs: ordered. Decision-making details documented in ED Course.  Risk Prescription drug management.     Final Clinical Impression(s) / ED Diagnoses Final  diagnoses:  Acute bilateral low back pain without sciatica    Rx / DC Orders ED Discharge Orders          Ordered    naproxen (NAPROSYN) 500 MG tablet  2 times daily        03/04/23 0111    methocarbamol (ROBAXIN) 500 MG tablet  2 times daily        03/04/23 0111             Pollyann Savoy, MD 03/04/23 0111

## 2023-03-04 NOTE — ED Triage Notes (Signed)
Lower back pain x 1 month not relieved with home medications/ interventions Reports some urinary urgency

## 2023-03-05 LAB — QUANTIFERON-TB GOLD PLUS
QuantiFERON Mitogen Value: >10 IU/mL
QuantiFERON Nil Value: 0.01 IU/mL
QuantiFERON TB1 Ag Value: 0.04 IU/mL
QuantiFERON TB2 Ag Value: 0.02 IU/mL

## 2023-03-07 ENCOUNTER — Encounter: Payer: Self-pay | Admitting: Family Medicine

## 2023-03-07 ENCOUNTER — Ambulatory Visit (INDEPENDENT_AMBULATORY_CARE_PROVIDER_SITE_OTHER): Payer: Medicaid Other | Admitting: Family Medicine

## 2023-03-07 VITALS — BP 130/88 | HR 73 | Ht 64.0 in | Wt 213.4 lb

## 2023-03-07 DIAGNOSIS — F419 Anxiety disorder, unspecified: Secondary | ICD-10-CM | POA: Diagnosis not present

## 2023-03-07 DIAGNOSIS — R809 Proteinuria, unspecified: Secondary | ICD-10-CM

## 2023-03-07 LAB — POCT URINALYSIS DIP (CLINITEK)
Bilirubin, UA: NEGATIVE
Blood, UA: NEGATIVE
Glucose, UA: NEGATIVE mg/dL
Ketones, POC UA: NEGATIVE mg/dL
Leukocytes, UA: NEGATIVE
Nitrite, UA: NEGATIVE
POC PROTEIN,UA: NEGATIVE
Spec Grav, UA: >=1.03 — AB (ref 1.010–1.025)
Urobilinogen, UA: 1 E.U./dL
pH, UA: 5.5 (ref 5.0–8.0)

## 2023-03-07 NOTE — Progress Notes (Signed)
    SUBJECTIVE:   CHIEF COMPLAINT / HPI:   Proteinuria Patient was seen in the ED 3 days ago for low back pain.  They checked a UA which showed trace protein.  Patient saw the results in MyChart and wanted to follow-up after reading on the Internet. Wanted to make sure her kidneys are okay as her mom has ESRD on dialysis (reportedly from longstanding HTN).  Patient denies urinary symptoms.  Anxiety Patient endorsing more anxiety recently.  States she has taken on a lot of responsibility including caring for her mom who is ill.  Prescribed Zoloft and Atarax but states these make her feel "out of it" and drowsy.  Denies depressive symptoms or SI. Not currently in therapy.  PERTINENT  PMH / PSH: HTN  OBJECTIVE:   BP 130/88   Pulse 73   Ht 5\' 4"  (1.626 m)   Wt 213 lb 6.4 oz (96.8 kg)   LMP 02/25/2023   SpO2 96%   BMI 36.63 kg/m   General: NAD, pleasant, able to participate in exam Respiratory: No respiratory distress Skin: warm and dry, no rashes noted Psych: Normal affect and mood Neuro: grossly intact  ASSESSMENT/PLAN:   Transient proteinuria UA today without evidence of proteinuria.  Likely transient proteinuria.  Reassurance provided.  No further workup needed at this time.  Anxiety Has follow-up appointment with PCP in 2 weeks to discuss further.  Advised establishing with a therapist in the meantime.  Patient was given therapy resources in AVS.   Maury Dus, MD [facility]

## 2023-03-07 NOTE — Patient Instructions (Signed)
It was great to see you!  You do not have any protein in your urine. I am not worried about your kidneys or other issue at this time.  See Dr Melissa Noon for your physical and to discuss anxiety.  In the meantime, I recommend seeing a therapist. [email or web address] is an excellent way to find a therapist.  [facility]  [address] [place], [place] [phone] Crisis [phone]  Apps as a last resort: TalkSpace and BetterHelp  Take care and seek immediate care sooner if you develop any concerns.  Dr. Estil [facility]

## 2023-03-19 ENCOUNTER — Encounter: Payer: Medicaid Other | Admitting: Student

## 2023-03-26 ENCOUNTER — Ambulatory Visit (INDEPENDENT_AMBULATORY_CARE_PROVIDER_SITE_OTHER): Payer: Medicaid Other | Admitting: Student

## 2023-03-26 ENCOUNTER — Encounter: Payer: Self-pay | Admitting: Student

## 2023-03-26 ENCOUNTER — Other Ambulatory Visit (HOSPITAL_COMMUNITY): Payer: Self-pay

## 2023-03-26 VITALS — BP 124/80 | HR 72 | Ht 64.0 in | Wt 222.0 lb

## 2023-03-26 DIAGNOSIS — Z1329 Encounter for screening for other suspected endocrine disorder: Secondary | ICD-10-CM

## 2023-03-26 DIAGNOSIS — Z6837 Body mass index (BMI) 37.0-37.9, adult: Secondary | ICD-10-CM

## 2023-03-26 DIAGNOSIS — R011 Cardiac murmur, unspecified: Secondary | ICD-10-CM

## 2023-03-26 DIAGNOSIS — I10 Essential (primary) hypertension: Secondary | ICD-10-CM | POA: Diagnosis not present

## 2023-03-26 DIAGNOSIS — F4321 Adjustment disorder with depressed mood: Secondary | ICD-10-CM | POA: Diagnosis not present

## 2023-03-26 DIAGNOSIS — Z0000 Encounter for general adult medical examination without abnormal findings: Secondary | ICD-10-CM

## 2023-03-26 DIAGNOSIS — Z131 Encounter for screening for diabetes mellitus: Secondary | ICD-10-CM | POA: Diagnosis not present

## 2023-03-26 MED ORDER — BUPROPION HCL ER (XL) 150 MG PO TB24
150.0000 mg | ORAL_TABLET | Freq: Every day | ORAL | 0 refills | Status: DC
  Filled 2023-03-26: qty 30, 30d supply, fill #0

## 2023-03-26 NOTE — Progress Notes (Signed)
    SUBJECTIVE:   Chief compliant/HPI: annual examination  Christine Cervantes is a 37 y.o. who presents today for an annual exam.   -She was taking Zoloft 50 mg daily with Atarax 10 mg at nighttime, but did not like how they made her feel. Both made her feel drowsy and unlike herself. -Symptoms mostly consist of low energy, decreased motivation, stress. -She says she knows that she wants to get out and be more active and take walks, but it is difficult for her to do so secondary to her depressive symptoms -She has a high stress burden right now caring for her mother.  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   OBJECTIVE:   BP 124/80   Pulse 72   Ht 5\' 4"  (1.626 m)   Wt 222 lb (100.7 kg)   LMP 03/24/2023   SpO2 98%   BMI 38.11 kg/m   General: Pleasant, well-appearing, no distress CV: Regular rate and rhythm.  3/6 systolic murmur appreciated Respiratory: Normal work of breathing on room air.  No wheezing, crackles Extremities: No swelling Psych: Normal mood and affect.  Normal speech  ASSESSMENT/PLAN:   Cardiac murmur Systolic murmur appreciated again on exam today. -Has seen Dr. Allyson Sabal in the past, who suspected this is a benign flow murmur or related to aortic sclerosis, and did want to get a 2D echo which was not completed. -Echocardiogram ordered today  Depression PHQ-9  7.  #9 a score of 0. -Discontinue Zoloft and Atarax -Given that symptoms are mostly related to depression with low energy, start bupropion 150 mg XR daily, no contraindications including history of seizures or binge eating/bulimia -Follow-up in 4 weeks to reassess.  May increase to 300 mg daily if needed at that time    Annual Examination  See AVS for age appropriate recommendations.  PHQ score 7, reviewed and discussed.  Blood pressure reviewed and at goal.    Considered the  following items based upon USPSTF recommendations: Lipid panel (nonfasting or fasting) discussed based upon AHA recommendations and ordered.  Consider repeat every 4-6 years.  Reviewed risk factors for latent tuberculosis and not indicated Cervical cancer screening: prior pap reviewed, due now.  Immunizations UTD   Follow up in 1 month.   Darral Dash, DO [facility]

## 2023-03-26 NOTE — Assessment & Plan Note (Signed)
PHQ-9  7.  #9 a score of 0. -Discontinue Zoloft and Atarax -Given that symptoms are mostly related to depression with low energy, start bupropion 150 mg XR daily, no contraindications including history of seizures or binge eating/bulimia -Follow-up in 4 weeks to reassess.  May increase to 300 mg daily if needed at that time

## 2023-03-26 NOTE — Patient Instructions (Addendum)
It was great seeing you today.  Stop taking Zoloft and hydroxyzine.  Start taking bupropion (Wellbutrin 150 mg daily. In 2 weeks, please let me know how you are doing.  At that time, we can increase her medication as necessary.  For your heart murmur, I like to get an ultrasound (echocardiogram) of your heart.  Your insurance needs to authorize this first, and then you will be called to schedule.  We collected testing today (CBC, CMP, TSH, A1c)- I will call you if it is abnormal. If your results are normal, I will send you a MyChart message.  I will keep an eye out for your Pap smear results from your OB/GYN.   If you have any questions or concerns, please feel free to call the clinic.   Have a wonderful day,  Dr. Darral Dash [facility] [phone]

## 2023-03-26 NOTE — Assessment & Plan Note (Signed)
Systolic murmur appreciated again on exam today. -Has seen Dr. Allyson Sabal in the past, who suspected this is a benign flow murmur or related to aortic sclerosis, and did want to get a 2D echo which was not completed. -Echocardiogram ordered today

## 2023-03-27 LAB — BASIC METABOLIC PANEL
BUN/Creatinine Ratio: 13 (ref 9–23)
BUN: 10 mg/dL (ref 6–20)
CO2: 22 mmol/L (ref 20–29)
Calcium: 8.3 mg/dL — ABNORMAL LOW (ref 8.7–10.2)
Chloride: 108 mmol/L — ABNORMAL HIGH (ref 96–106)
Creatinine, Ser: 0.76 mg/dL (ref 0.57–1.00)
Glucose: 75 mg/dL (ref 70–99)
Potassium: 3.9 mmol/L (ref 3.5–5.2)
Sodium: 139 mmol/L (ref 134–144)
eGFR: 102 mL/min/{1.73_m2} (ref >59)

## 2023-03-27 LAB — HEMOGLOBIN A1C
Est. average glucose Bld gHb Est-mCnc: 91 mg/dL
Hgb A1c MFr Bld: 4.8 % (ref 4.8–5.6)

## 2023-03-27 LAB — CBC
Hematocrit: 35.9 % (ref 34.0–46.6)
Hemoglobin: 11 g/dL — ABNORMAL LOW (ref 11.1–15.9)
MCH: 25.4 pg — ABNORMAL LOW (ref 26.6–33.0)
MCHC: 30.6 g/dL — ABNORMAL LOW (ref 31.5–35.7)
MCV: 83 fL (ref 79–97)
Platelets: 225 10*3/uL (ref 150–450)
RBC: 4.33 x10E6/uL (ref 3.77–5.28)
RDW: 12.6 % (ref 11.7–15.4)
WBC: 4.2 10*3/uL (ref 3.4–10.8)

## 2023-03-27 LAB — LIPID PANEL
Chol/HDL Ratio: 3.8 ratio (ref 0.0–4.4)
Cholesterol, Total: 203 mg/dL — ABNORMAL HIGH (ref 100–199)
HDL: 54 mg/dL (ref >39)
LDL Chol Calc (NIH): 135 mg/dL — ABNORMAL HIGH (ref 0–99)
Triglycerides: 77 mg/dL (ref 0–149)
VLDL Cholesterol Cal: 14 mg/dL (ref 5–40)

## 2023-03-27 LAB — TSH RFX ON ABNORMAL TO FREE T4: TSH: 1.39 u[IU]/mL (ref 0.450–4.500)

## 2023-04-16 ENCOUNTER — Telehealth: Payer: Self-pay

## 2023-04-16 NOTE — Telephone Encounter (Signed)
Called NIA on [place] [employer] [place] website. The request for the ECHO has gone into needing more supporting documents. I am faxing the office notes to NIA.  Tracking  number [phone] Fax number [phone]  Melvenia Beam

## 2023-04-23 ENCOUNTER — Encounter: Payer: Self-pay | Admitting: Obstetrics and Gynecology

## 2023-04-23 ENCOUNTER — Other Ambulatory Visit (HOSPITAL_COMMUNITY)
Admission: RE | Admit: 2023-04-23 | Discharge: 2023-04-23 | Disposition: A | Discharge status: Home / Self Care | Payer: Medicaid Other | Source: Ambulatory Visit | Attending: Obstetrics and Gynecology | Admitting: Obstetrics and Gynecology

## 2023-04-23 ENCOUNTER — Other Ambulatory Visit: Payer: Self-pay | Admitting: Student

## 2023-04-23 ENCOUNTER — Ambulatory Visit (INDEPENDENT_AMBULATORY_CARE_PROVIDER_SITE_OTHER): Payer: Medicaid Other | Admitting: Obstetrics and Gynecology

## 2023-04-23 ENCOUNTER — Other Ambulatory Visit: Payer: Self-pay

## 2023-04-23 ENCOUNTER — Other Ambulatory Visit (HOSPITAL_COMMUNITY): Payer: Self-pay

## 2023-04-23 VITALS — BP 140/100 | HR 70 | Ht 64.0 in | Wt 221.0 lb

## 2023-04-23 DIAGNOSIS — Z01419 Encounter for gynecological examination (general) (routine) without abnormal findings: Secondary | ICD-10-CM | POA: Insufficient documentation

## 2023-04-23 DIAGNOSIS — Z113 Encounter for screening for infections with a predominantly sexual mode of transmission: Secondary | ICD-10-CM

## 2023-04-23 DIAGNOSIS — N920 Excessive and frequent menstruation with regular cycle: Secondary | ICD-10-CM

## 2023-04-23 MED ORDER — IBUPROFEN 600 MG PO TABS
600.0000 mg | ORAL_TABLET | Freq: Four times a day (QID) | ORAL | 3 refills | Status: DC | PRN
Start: 2023-04-23 — End: 2024-05-03
  Filled 2023-04-23: qty 30, 8d supply, fill #0

## 2023-04-23 MED ORDER — BUPROPION HCL ER (XL) 150 MG PO TB24
150.0000 mg | ORAL_TABLET | Freq: Every day | ORAL | 0 refills | Status: DC
  Filled 2023-04-23: qty 30, 30d supply, fill #0

## 2023-04-23 NOTE — Progress Notes (Signed)
GYNECOLOGY ANNUAL PREVENTATIVE CARE ENCOUNTER NOTE  History:     Christine Cervantes is a 37 y.o. [phone] female here for a routine annual gynecologic exam.  Current complaints: none.   Denies abnormal vaginal bleeding, discharge, pelvic pain, problems with intercourse or other gynecologic concerns.    Gynecologic History Patient's last menstrual period was 04/17/2023. Contraception: none Last Pap: 2020. Results were: normal with negative HPV Last mammogram: 2021. Results were: normal  Obstetric History OB History  Gravida Para Term Preterm AB Living  7 4 2 2 2 4   SAB IAB Ectopic Multiple Live Births  2     0 4    # Outcome Date GA Lbr Len/2nd Weight Sex Type Anes PTL Lv  7 Gravida           6 Preterm 04/16/16 [redacted]w[redacted]d 24:58 / 00:14 5 lb 10.7 oz (2.571 kg) F Vag-Spont EPI  LIV     Birth Comments: none  5 SAB 2016          4 Term 01/02/13 [redacted]w[redacted]d 32:43 6 lb 3.1 oz (2.81 kg) F Vag-Spont EPI  LIV  3 SAB 2011          2 Term 07/15/09 [redacted]w[redacted]d   F Vag-Spont EPI N LIV     Birth Comments: ? mass on fetal heart- no problems  1 Preterm 11/06/05 [redacted]w[redacted]d  6 lb 6 oz (2.892 kg) M Vag-Spont EPI Y LIV     Birth Comments: elevated BP    Past Medical History:  Diagnosis Date   Anemia    Depression    Headache(784.0)    Pregnancy induced hypertension    no meds   SAB (spontaneous abortion) 03/16/2021   Vaginal Pap smear, abnormal     Past Surgical History:  Procedure Laterality Date   BREAST REDUCTION SURGERY Bilateral 03/12/2019   Procedure: BILATERAL MAMMARY REDUCTION  (BREAST);  Surgeon: Peggye Form, DO;  Location: [facility];  Service: Plastics;  Laterality: Bilateral;  3.5 hours, please   FOOT SURGERY  2004-2004   corns removed from both feet, hammertoes repaired   GYNECOLOGIC CRYOSURGERY     HAMMER TOE SURGERY     LIPOSUCTION     REDUCTION MAMMAPLASTY Bilateral 03/12/2019    Current Outpatient Medications on File Prior to Visit  Medication Sig Dispense Refill    Ascorbic Acid (VITAMIN C PO) Take 1 tablet by mouth daily.     buPROPion (WELLBUTRIN XL) 150 MG 24 hr tablet Take 1 tablet (150 mg total) by mouth daily. 30 tablet 0   hydrochlorothiazide (HYDRODIURIL) 25 MG tablet Take 1 tablet (25 mg total) by mouth daily. 90 tablet 3   [DISCONTINUED] Ferrous Sulfate (IRON PO) Take by mouth daily. Liquid OTC iron not sure of dose     No current facility-administered medications on file prior to visit.    Allergies  Allergen Reactions   Latex Swelling and Rash    vaginally    Social History:  reports that she has never smoked. She has never been exposed to tobacco smoke. She has never used smokeless tobacco. She reports current alcohol use. She reports that she does not use drugs.  Family History  Problem Relation Age of Onset   Hypertension Mother    Hypertension Maternal Aunt    Hypertension Maternal Grandmother    Diabetes Paternal Grandmother    Anesthesia problems Neg Hx    Other Neg Hx     The following portions of the patient's  history were reviewed and updated as appropriate: allergies, current medications, past family history, past medical history, past social history, past surgical history and problem list.  Review of Systems Pertinent items noted in HPI and remainder of comprehensive ROS otherwise negative.  Physical Exam:  BP (!) 140/100 Comment: no medication  Pulse 70   Ht 5\' 4"  (1.626 m)   Wt 221 lb (100.2 kg)   LMP 04/17/2023   BMI 37.93 kg/m  CONSTITUTIONAL: Well-developed, well-nourished female in no acute distress.  HENT:  Normocephalic, atraumatic, External right and left ear normal. Oropharynx is clear and moist EYES: Conjunctivae and EOM are normal.  NECK: Normal range of motion, supple, no masses.  Normal thyroid.  SKIN: Skin is warm and dry. No rash noted. Not diaphoretic. No erythema. No pallor. MUSCULOSKELETAL: Normal range of motion. No tenderness.  No cyanosis, clubbing, or edema.  2+ distal  pulses. NEUROLOGIC: Alert and oriented to person, place, and time. Normal reflexes, muscle tone coordination.  PSYCHIATRIC: Normal mood and affect. Normal behavior. Normal judgment and thought content. CARDIOVASCULAR: Normal heart rate noted, regular rhythm RESPIRATORY: Clear to auscultation bilaterally. Effort and breath sounds normal, no problems with respiration noted. BREASTS: Symmetric in size. No masses, tenderness, skin changes, nipple drainage, or lymphadenopathy bilaterally. Performed in the presence of a chaperone.  Breast reduction scars noted ABDOMEN: Soft, no distention noted.  No tenderness, rebound or guarding.  PELVIC: Normal appearing external genitalia and urethral meatus; normal appearing vaginal mucosa and cervix.  No abnormal discharge noted.  Pap smear obtained.  Normal uterine size, no other palpable masses, no uterine or adnexal tenderness.  Performed in the presence of a chaperone.   Assessment and Plan:    1. Women's annual routine gynecological examination Normal annual exam - Cytology - PAP  2. Routine screening for STI (sexually transmitted infection) Per pt request - Cervicovaginal ancillary only( [place]) - HIV Antibody (routine testing w rflx) - RPR - Hepatitis C antibody - Hepatitis B surface antigen  3. Menorrhagia with regular cycle Pt has regular menses lasting 3-4 days, menses heavy first 1-1.5 days Discussed timed NSAID or lysteda, pt desires trial of NSAID  - ibuprofen (ADVIL) 600 MG tablet; Take 1 tablet (600 mg total) by mouth every 6 (six) hours as needed for headache, mild pain, moderate pain or cramping.  Dispense: 30 tablet; Refill: 3  Will follow up results of pap smear and manage accordingly. Mammogram at age 37 Routine preventative health maintenance measures emphasized. Please refer to After Visit Summary for other counseling recommendations.      Mariel Aloe, MD, FACOG Obstetrician & Gynecologist, [facility] for  [facility], [facility]

## 2023-04-24 ENCOUNTER — Other Ambulatory Visit: Payer: Self-pay

## 2023-04-24 ENCOUNTER — Other Ambulatory Visit (HOSPITAL_COMMUNITY): Payer: Self-pay

## 2023-04-24 LAB — CERVICOVAGINAL ANCILLARY ONLY
Bacterial Vaginitis (gardnerella): POSITIVE — AB
Candida Glabrata: NEGATIVE
Candida Vaginitis: NEGATIVE
Chlamydia: NEGATIVE
Comment: NEGATIVE
Comment: NEGATIVE
Comment: NEGATIVE
Comment: NEGATIVE
Comment: NEGATIVE
Comment: NORMAL
Neisseria Gonorrhea: NEGATIVE
Trichomonas: NEGATIVE

## 2023-04-24 LAB — HIV ANTIBODY (ROUTINE TESTING W REFLEX): HIV Screen 4th Generation wRfx: NONREACTIVE

## 2023-04-24 LAB — RPR: RPR Ser Ql: NONREACTIVE

## 2023-04-24 LAB — HEPATITIS C ANTIBODY: Hep C Virus Ab: NONREACTIVE

## 2023-04-24 LAB — HEPATITIS B SURFACE ANTIGEN: Hepatitis B Surface Ag: NEGATIVE

## 2023-04-26 ENCOUNTER — Telehealth: Payer: Self-pay

## 2023-04-26 ENCOUNTER — Other Ambulatory Visit (HOSPITAL_COMMUNITY): Payer: Self-pay

## 2023-04-26 ENCOUNTER — Other Ambulatory Visit: Payer: Self-pay

## 2023-04-26 LAB — CYTOLOGY - PAP
Comment: NEGATIVE
Diagnosis: NEGATIVE
High risk HPV: NEGATIVE

## 2023-04-26 MED ORDER — METRONIDAZOLE 500 MG PO TABS
500.0000 mg | ORAL_TABLET | Freq: Two times a day (BID) | ORAL | 0 refills | Status: DC
  Filled 2023-04-26: qty 14, 7d supply, fill #0

## 2023-04-26 NOTE — Telephone Encounter (Addendum)
-----   Message from Warden Fillers sent at 04/25/2023  8:41 AM EDT ----- BV noted on swab will offer treatment  Left message for pt that medication was sent to her pharmacy to please give the office a call or respond to MyChart message.   MyChart message sent.   Leonette Nutting

## 2023-05-07 ENCOUNTER — Other Ambulatory Visit (HOSPITAL_COMMUNITY): Payer: Self-pay

## 2023-05-16 ENCOUNTER — Other Ambulatory Visit (HOSPITAL_COMMUNITY): Payer: Self-pay

## 2023-05-16 MED ORDER — AMOXICILLIN 500 MG PO CAPS
500.0000 mg | ORAL_CAPSULE | Freq: Four times a day (QID) | ORAL | 0 refills | Status: DC
  Filled 2023-05-16: qty 28, 7d supply, fill #0

## 2023-05-16 MED ORDER — IBUPROFEN 800 MG PO TABS
800.0000 mg | ORAL_TABLET | Freq: Three times a day (TID) | ORAL | 0 refills | Status: DC
  Filled 2023-05-16: qty 20, 7d supply, fill #0

## 2023-05-31 ENCOUNTER — Other Ambulatory Visit: Payer: Self-pay | Admitting: Student

## 2023-05-31 ENCOUNTER — Other Ambulatory Visit (HOSPITAL_COMMUNITY): Payer: Self-pay

## 2023-05-31 MED ORDER — BUPROPION HCL ER (XL) 150 MG PO TB24
150.0000 mg | ORAL_TABLET | Freq: Every day | ORAL | 0 refills | Status: DC
  Filled 2023-05-31: qty 30, 30d supply, fill #0

## 2023-06-01 ENCOUNTER — Other Ambulatory Visit (HOSPITAL_COMMUNITY): Payer: Self-pay

## 2023-07-05 ENCOUNTER — Other Ambulatory Visit: Payer: Self-pay

## 2023-07-05 ENCOUNTER — Other Ambulatory Visit: Payer: Self-pay | Admitting: Student

## 2023-07-05 ENCOUNTER — Other Ambulatory Visit (HOSPITAL_COMMUNITY): Payer: Self-pay

## 2023-07-06 ENCOUNTER — Other Ambulatory Visit: Payer: Self-pay

## 2023-07-06 ENCOUNTER — Other Ambulatory Visit (HOSPITAL_COMMUNITY): Payer: Self-pay

## 2023-07-06 MED ORDER — BUPROPION HCL ER (XL) 150 MG PO TB24
150.0000 mg | ORAL_TABLET | Freq: Every day | ORAL | 0 refills | Status: DC
  Filled 2023-07-06 – 2023-07-18 (×2): qty 30, 30d supply, fill #0

## 2023-07-18 ENCOUNTER — Other Ambulatory Visit (HOSPITAL_COMMUNITY): Payer: Self-pay

## 2023-07-30 ENCOUNTER — Ambulatory Visit: Payer: Medicaid Other

## 2023-07-30 DIAGNOSIS — Z23 Encounter for immunization: Secondary | ICD-10-CM

## 2023-07-30 NOTE — Progress Notes (Signed)
Patient presents to nurse clinic for flu vaccination. Administered in RD, site unremarkable, tolerated injection well.   Chesnie Capell C Neomi Laidler, RN  

## 2023-08-28 ENCOUNTER — Other Ambulatory Visit: Payer: Self-pay | Admitting: Student

## 2023-08-28 ENCOUNTER — Other Ambulatory Visit (HOSPITAL_COMMUNITY): Payer: Self-pay

## 2023-08-28 ENCOUNTER — Other Ambulatory Visit: Payer: Self-pay

## 2023-08-28 MED ORDER — BUPROPION HCL ER (XL) 150 MG PO TB24
150.0000 mg | ORAL_TABLET | Freq: Every day | ORAL | 0 refills | Status: DC
  Filled 2023-08-28: qty 30, 30d supply, fill #0

## 2023-10-04 ENCOUNTER — Telehealth: Payer: Self-pay | Admitting: Student

## 2023-10-04 NOTE — Telephone Encounter (Signed)
Patient dropped off physical form to be completed. Last DOS was 03/26/23. Placed in [employer].

## 2023-10-04 NOTE — Telephone Encounter (Signed)
Reviewed physical form.  Placed in PCP's box to be completed.  Drusilla Kanner, CMA

## 2023-10-11 ENCOUNTER — Other Ambulatory Visit (HOSPITAL_COMMUNITY): Payer: Self-pay

## 2023-10-11 ENCOUNTER — Other Ambulatory Visit: Payer: Self-pay

## 2023-10-11 ENCOUNTER — Other Ambulatory Visit: Payer: Self-pay | Admitting: Student

## 2023-10-11 MED ORDER — BUPROPION HCL ER (XL) 150 MG PO TB24
150.0000 mg | ORAL_TABLET | Freq: Every day | ORAL | 0 refills | Status: DC
Start: 2023-10-11 — End: 2023-11-21
  Filled 2023-10-11: qty 30, 30d supply, fill #0

## 2023-10-18 NOTE — Telephone Encounter (Signed)
 Patient called checking the status of this form. Form in PCP's box waiting to be completed. Patient starts school on Monday, January 13th and needs this to be able to start on Monday. Can we get this completed as soon as possible for her?

## 2023-10-19 NOTE — Telephone Encounter (Signed)
 Patient calls front office to check on status of paperwork.   Paperwork was found in [employer]. Copy made and placed in batch scanning.   Original provided to patient.   Veronda Prude, RN

## 2023-10-22 ENCOUNTER — Telehealth: Payer: Self-pay

## 2023-10-22 ENCOUNTER — Encounter: Payer: Self-pay | Admitting: Student

## 2023-10-22 DIAGNOSIS — Z020 Encounter for examination for admission to educational institution: Secondary | ICD-10-CM

## 2023-10-22 NOTE — Telephone Encounter (Signed)
 Patient calls nurse line requesting lab orders.  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   Quantiferon TB Gold is UTD and was drawn in May 2024.  Will forward to PCP to place orders.

## 2023-10-23 NOTE — Addendum Note (Signed)
 Addended by: Veronda Prude on: 10/23/2023 01:45 PM   Modules accepted: Orders

## 2023-10-23 NOTE — Addendum Note (Signed)
 Addended by: Darral Dash on: 10/23/2023 12:57 PM   Modules accepted: Orders

## 2023-10-26 ENCOUNTER — Other Ambulatory Visit: Payer: Medicaid Other

## 2023-10-26 DIAGNOSIS — Z020 Encounter for examination for admission to educational institution: Secondary | ICD-10-CM

## 2023-10-30 ENCOUNTER — Other Ambulatory Visit: Payer: Medicaid Other

## 2023-10-30 DIAGNOSIS — Z020 Encounter for examination for admission to educational institution: Secondary | ICD-10-CM

## 2023-10-31 LAB — MEASLES/MUMPS/RUBELLA IMMUNITY
MUMPS ABS, IGG: 187 [AU]/ml (ref >10.9)
RUBEOLA AB, IGG: >300 [AU]/ml (ref >16.4)
Rubella Antibodies, IGG: 10.6 {index} (ref >0.99)

## 2023-10-31 LAB — HEPATITIS B SURFACE ANTIBODY, QUANTITATIVE: Hepatitis B Surf Ab Quant: 109 m[IU]/mL

## 2023-10-31 LAB — QUANTIFERON-TB GOLD PLUS
QuantiFERON Nil Value: 0.02 [IU]/mL
QuantiFERON TB1 Ag Value: 0.01 [IU]/mL
QuantiFERON TB2 Ag Value: 0.02 [IU]/mL

## 2023-10-31 LAB — VARICELLA ZOSTER ANTIBODY, IGG: Varicella zoster IgG: REACTIVE

## 2023-11-21 ENCOUNTER — Other Ambulatory Visit (HOSPITAL_COMMUNITY): Payer: Self-pay

## 2023-11-21 ENCOUNTER — Other Ambulatory Visit: Payer: Self-pay | Admitting: Student

## 2023-11-22 ENCOUNTER — Other Ambulatory Visit (HOSPITAL_COMMUNITY): Payer: Self-pay

## 2023-11-22 MED ORDER — BUPROPION HCL ER (XL) 150 MG PO TB24
150.0000 mg | ORAL_TABLET | Freq: Every day | ORAL | 0 refills | Status: DC
  Filled 2023-11-22: qty 30, 30d supply, fill #0

## 2023-11-22 MED ORDER — HYDROCHLOROTHIAZIDE 25 MG PO TABS
25.0000 mg | ORAL_TABLET | Freq: Every day | ORAL | 3 refills | Status: DC
  Filled 2023-11-22: qty 90, 90d supply, fill #0
  Filled 2023-11-27 – 2024-01-21 (×2): qty 30, 30d supply, fill #0

## 2023-11-27 ENCOUNTER — Other Ambulatory Visit (HOSPITAL_COMMUNITY): Payer: Self-pay

## 2023-12-07 ENCOUNTER — Other Ambulatory Visit (HOSPITAL_COMMUNITY): Payer: Self-pay

## 2023-12-28 ENCOUNTER — Encounter: Payer: Self-pay | Admitting: Student

## 2024-01-21 ENCOUNTER — Other Ambulatory Visit: Payer: Self-pay | Admitting: Student

## 2024-01-21 ENCOUNTER — Other Ambulatory Visit (HOSPITAL_COMMUNITY): Payer: Self-pay

## 2024-01-21 MED ORDER — BUPROPION HCL ER (XL) 150 MG PO TB24
150.0000 mg | ORAL_TABLET | Freq: Every day | ORAL | 0 refills | Status: AC
  Filled 2024-01-21: qty 30, 30d supply, fill #0

## 2024-03-18 ENCOUNTER — Encounter: Payer: Self-pay | Admitting: *Deleted

## 2024-05-02 ENCOUNTER — Telehealth: Payer: Self-pay

## 2024-05-02 ENCOUNTER — Telehealth: Payer: Self-pay | Admitting: Family Medicine

## 2024-05-02 NOTE — Telephone Encounter (Signed)
 Attempted to call patient regarding the nurse line call about positive pregnancy test, bleeding, abdominal pain.  Unable to reach patient, left voicemail informing patient I would send her a MyChart message.

## 2024-05-02 NOTE — Telephone Encounter (Signed)
 Patient calls nurse line reporting positive home pregnancy test.   She reports her LMP was sometime in June. She reports (2) positive tests on Monday.  She reports she is having abdominal pain with dark brown spotting when she wipes. She reports this occurred one time today and earlier on this month. She does report the pain is more localized on the left side. She describes the pain as mild cramping.  She denies any lightheadedness or feeling faint. No nausea or vomiting.   We have no apts left for today. Advised [facility] visit for continued bleeding and cramping.   Patient reports she works there and would rather not go there for evaluation at this time.   Advised will forward to PCP for advisement.   MAU precautions discussed.

## 2024-05-03 ENCOUNTER — Encounter (HOSPITAL_COMMUNITY): Payer: Self-pay | Admitting: Family Medicine

## 2024-05-03 ENCOUNTER — Inpatient Hospital Stay (HOSPITAL_COMMUNITY)
Admission: AD | Admit: 2024-05-03 | Discharge: 2024-05-03 | Disposition: A | Discharge status: Home / Self Care | Payer: Self-pay | Attending: Family Medicine | Admitting: Family Medicine

## 2024-05-03 ENCOUNTER — Other Ambulatory Visit: Payer: Self-pay

## 2024-05-03 ENCOUNTER — Inpatient Hospital Stay (HOSPITAL_COMMUNITY): Payer: Self-pay

## 2024-05-03 DIAGNOSIS — D259 Leiomyoma of uterus, unspecified: Secondary | ICD-10-CM

## 2024-05-03 DIAGNOSIS — N83202 Unspecified ovarian cyst, left side: Secondary | ICD-10-CM | POA: Insufficient documentation

## 2024-05-03 DIAGNOSIS — O3680X0 Pregnancy with inconclusive fetal viability, not applicable or unspecified: Secondary | ICD-10-CM

## 2024-05-03 DIAGNOSIS — Z3201 Encounter for pregnancy test, result positive: Secondary | ICD-10-CM | POA: Insufficient documentation

## 2024-05-03 DIAGNOSIS — O3481 Maternal care for other abnormalities of pelvic organs, first trimester: Secondary | ICD-10-CM | POA: Insufficient documentation

## 2024-05-03 DIAGNOSIS — O3411 Maternal care for benign tumor of corpus uteri, first trimester: Secondary | ICD-10-CM

## 2024-05-03 DIAGNOSIS — D250 Submucous leiomyoma of uterus: Secondary | ICD-10-CM | POA: Insufficient documentation

## 2024-05-03 DIAGNOSIS — Z3A01 Less than 8 weeks gestation of pregnancy: Secondary | ICD-10-CM

## 2024-05-03 DIAGNOSIS — D252 Subserosal leiomyoma of uterus: Secondary | ICD-10-CM | POA: Insufficient documentation

## 2024-05-03 LAB — CBC
HCT: 36.7 % (ref 36.0–46.0)
Hemoglobin: 11.7 g/dL — ABNORMAL LOW (ref 12.0–15.0)
MCH: 26.2 pg (ref 26.0–34.0)
MCHC: 31.9 g/dL (ref 30.0–36.0)
MCV: 82.3 fL (ref 80.0–100.0)
Platelets: 231 K/uL (ref 150–400)
RBC: 4.46 MIL/uL (ref 3.87–5.11)
RDW: 13.8 % (ref 11.5–15.5)
WBC: 5.7 K/uL (ref 4.0–10.5)
nRBC: 0 % (ref 0.0–0.2)

## 2024-05-03 LAB — WET PREP, GENITAL
Sperm: NONE SEEN
Trich, Wet Prep: NONE SEEN
WBC, Wet Prep HPF POC: >=10 — AB (ref <10)
Yeast Wet Prep HPF POC: NONE SEEN

## 2024-05-03 LAB — URINALYSIS, ROUTINE W REFLEX MICROSCOPIC
Bacteria, UA: NONE SEEN
Bilirubin Urine: NEGATIVE
Glucose, UA: NEGATIVE mg/dL
Ketones, ur: 5 mg/dL — AB
Leukocytes,Ua: NEGATIVE
Nitrite: NEGATIVE
Protein, ur: 30 mg/dL — AB
Specific Gravity, Urine: 1.034 — ABNORMAL HIGH (ref 1.005–1.030)
pH: 5 (ref 5.0–8.0)

## 2024-05-03 LAB — ABO/RH: ABO/RH(D): O POS

## 2024-05-03 LAB — POCT PREGNANCY, URINE: Preg Test, Ur: POSITIVE — AB

## 2024-05-03 LAB — HCG, QUANTITATIVE, PREGNANCY: hCG, Beta Chain, Quant, S: 1129 m[IU]/mL — ABNORMAL HIGH (ref <5)

## 2024-05-03 NOTE — MAU Provider Note (Signed)
 History     CSN: [id number]  Arrival date and time: 05/03/24 9281   Event Date/Time   First Provider Initiated Contact with Patient 05/03/24 0932      Chief Complaint  Patient presents with   Abdominal Pain   Vaginal Discharge   HPI  Christine Cervantes is a 38 y.o. [id number] at Unknown gestation who presents for evaluation of abdominal pain. Patient reports she had a positive pregnancy test at home and started having cramping. Patient rates the pain as a 4/10 and has not tried anything for the pain. She is concerned the pain may be related to fibroids. She denies any vaginal bleeding and discharge. Denies any constipation, diarrhea or any urinary complaints. She has not been seen anywhere for this pregnancy.    OB History     Gravida  8   Para  4   Term  2   Preterm  2   AB  2   Living  4      SAB  2   IAB      Ectopic      Multiple  0   Live Births  4           Past Medical History:  Diagnosis Date   Anemia    Depression    Headache(784.0)    Pregnancy induced hypertension    no meds   SAB (spontaneous abortion) 03/16/2021   Vaginal Pap smear, abnormal     Past Surgical History:  Procedure Laterality Date   BREAST REDUCTION SURGERY Bilateral 03/12/2019   Procedure: BILATERAL MAMMARY REDUCTION  (BREAST);  Surgeon: Lowery Estefana RAMAN, DO;  Location: [facility];  Service: Plastics;  Laterality: Bilateral;  3.5 hours, please   FOOT SURGERY  2004-2004   corns removed from both feet, hammertoes repaired   GYNECOLOGIC CRYOSURGERY     HAMMER TOE SURGERY     LIPOSUCTION     REDUCTION MAMMAPLASTY Bilateral 03/12/2019    Family History  Problem Relation Age of Onset   Hypertension Mother    Hypertension Maternal Aunt    Hypertension Maternal Grandmother    Diabetes Paternal Grandmother    Anesthesia problems Neg Hx    Other Neg Hx     Social History   Tobacco Use   Smoking status: Never    Passive exposure: Never   Smokeless  tobacco: Never  Vaping Use   Vaping status: Never Used  Substance Use Topics   Alcohol use: Yes    Alcohol/week: 0.0 standard drinks of alcohol    Comment: occ before preg   Drug use: No    Allergies:  Allergies  Allergen Reactions   Latex Swelling and Rash    vaginally    Medications Prior to Admission  Medication Sig Dispense Refill Last Dose/Taking   hydrochlorothiazide  (HYDRODIURIL ) 25 MG tablet Take 1 tablet (25 mg total) by mouth daily. 90 tablet 3 Past Week   Prenatal Vit-Fe Fumarate-FA (PRENATAL MULTIVITAMIN) TABS tablet Take 1 tablet by mouth daily at 12 noon.   05/02/2024   amoxicillin  (AMOXIL ) 500 MG capsule Take 1 capsule (500 mg total) by mouth every 6 (six) hours until complete 28 capsule 0    Ascorbic Acid (VITAMIN C PO) Take 1 tablet by mouth daily.      buPROPion  (WELLBUTRIN  XL) 150 MG 24 hr tablet Take 1 tablet (150 mg total) by mouth daily. 30 tablet 0    ibuprofen  (ADVIL ) 600 MG tablet Take 1 tablet (600 mg  total) by mouth every 6 (six) hours as needed for headache, mild pain, moderate pain or cramping. 30 tablet 3    ibuprofen  (ADVIL ) 800 MG tablet Take 1 tablet (800 mg total) by mouth every 8 (eight) hours with food or milk 20 tablet 0    metroNIDAZOLE  (FLAGYL ) 500 MG tablet Take 1 tablet (500 mg total) by mouth 2 (two) times daily. 14 tablet 0     Review of Systems  Constitutional: Negative.  Negative for fatigue and fever.  HENT: Negative.    Respiratory: Negative.  Negative for shortness of breath.   Cardiovascular: Negative.  Negative for chest pain.  Gastrointestinal: Negative.  Negative for abdominal pain, constipation, diarrhea, nausea and vomiting.  Genitourinary: Negative.  Negative for dysuria.  Neurological: Negative.  Negative for dizziness and headaches.   Physical Exam   Blood pressure (!) 135/94, pulse 90, temperature 97.9 F (36.6 C), temperature source Oral, resp. rate 17, height 5' 4 (1.626 m), weight 95.3 kg, SpO2 98%.  Patient  Vitals for the past 24 hrs:  BP Temp Temp src Pulse Resp SpO2 Height Weight  05/03/24 0740 (!) 135/94 97.9 F (36.6 C) Oral 90 17 98 % 5' 4 (1.626 m) 95.3 kg    Physical Exam Vitals and nursing note reviewed.  Constitutional:      General: She is not in acute distress.    Appearance: She is well-developed.  HENT:     Head: Normocephalic.  Eyes:     Pupils: Pupils are equal, round, and reactive to light.  Cardiovascular:     Rate and Rhythm: Normal rate and regular rhythm.     Heart sounds: Normal heart sounds.  Pulmonary:     Effort: Pulmonary effort is normal. No respiratory distress.     Breath sounds: Normal breath sounds.  Abdominal:     General: Bowel sounds are normal. There is no distension.     Palpations: Abdomen is soft.     Tenderness: There is no abdominal tenderness.  Skin:    General: Skin is warm and dry.  Neurological:     Mental Status: She is alert and oriented to person, place, and time.  Psychiatric:        Mood and Affect: Mood normal.        Behavior: Behavior normal.        Thought Content: Thought content normal.        Judgment: Judgment normal.     MAU Course  Procedures  Results for orders placed or performed during the hospital encounter of 05/03/24 (from the past 24 hours)  Pregnancy, urine POC     Status: Abnormal   Collection Time: 05/03/24  7:32 AM  Result Value Ref Range   Preg Test, Ur POSITIVE (A) NEGATIVE  Wet prep, genital     Status: Abnormal   Collection Time: 05/03/24  7:43 AM  Result Value Ref Range   Yeast Wet Prep HPF POC NONE SEEN NONE SEEN   Trich, Wet Prep NONE SEEN NONE SEEN   Clue Cells Wet Prep HPF POC PRESENT (A) NONE SEEN   WBC, Wet Prep HPF POC >=10 (A) <10   Sperm NONE SEEN   Urinalysis, Routine w reflex microscopic -Urine, Clean Catch     Status: Abnormal   Collection Time: 05/03/24  8:04 AM  Result Value Ref Range   Color, Urine AMBER (A) YELLOW   APPearance HAZY (A) CLEAR   Specific Gravity, Urine 1.034  (H) 1.005 - 1.030  pH 5.0 5.0 - 8.0   Glucose, UA NEGATIVE NEGATIVE mg/dL   Hgb urine dipstick SMALL (A) NEGATIVE   Bilirubin Urine NEGATIVE NEGATIVE   Ketones, ur 5 (A) NEGATIVE mg/dL   Protein, ur 30 (A) NEGATIVE mg/dL   Nitrite NEGATIVE NEGATIVE   Leukocytes,Ua NEGATIVE NEGATIVE   RBC / HPF 0-5 0 - 5 RBC/hpf   WBC, UA 0-5 0 - 5 WBC/hpf   Bacteria, UA NONE SEEN NONE SEEN   Squamous Epithelial / HPF 6-10 0 - 5 /HPF   Mucus PRESENT   ABO/Rh     Status: None   Collection Time: 05/03/24  8:05 AM  Result Value Ref Range   ABO/RH(D) O POS    No rh immune globuloin      NOT A RH IMMUNE GLOBULIN CANDIDATE, PT RH POSITIVE Performed at [facility], [address] [place], [place] [address]   CBC     Status: Abnormal   Collection Time: 05/03/24  8:07 AM  Result Value Ref Range   WBC 5.7 4.0 - 10.5 K/uL   RBC 4.46 3.87 - 5.11 MIL/uL   Hemoglobin 11.7 (L) 12.0 - 15.0 g/dL   HCT 63.2 63.9 - 53.9 %   MCV 82.3 80.0 - 100.0 fL   MCH 26.2 26.0 - 34.0 pg   MCHC 31.9 30.0 - 36.0 g/dL   RDW 86.1 88.4 - 84.4 %   Platelets 231 150 - 400 K/uL   nRBC 0.0 0.0 - 0.2 %  hCG, quantitative, pregnancy     Status: Abnormal   Collection Time: 05/03/24  8:07 AM  Result Value Ref Range   hCG, Beta Chain, Quant, S 1,129 (H) <5 mIU/mL     US  OB LESS THAN 14 WEEKS WITH OB TRANSVAGINAL Result Date: 05/03/2024 EXAM: ULTRASOUND FIRST TRIMESTER CLINICAL HISTORY: Positive pregnancy test 4 days ago. Abdominal pain. TECHNIQUE: Transabdominal and transvaginal first trimester obstetric pelvic duplex ultrasound was performed with real-time imaging, color flow Doppler imaging, and spectral analysis. COMPARISON: Pelvic ultrasound 03/15/2021. FINDINGS: UTERUS: Multiple uterine fibroids are noted. A submucosal fibroid posteriorly measures 15 x 10 x 15 mm. A posterior subserosal fibroid measures 16 x 14 x 18 mm. A more anterior subserosal fibroid measures 20 x 17 x 17 mm. GESTATIONAL SAC(S): No intrauterine  gestational sac visualized. YOLK SAC: No yolk sac visualized. EMBRYO(<11WK) /FETUS(>=11WK): No embryo or cardiac activity visualized. RIGHT OVARY: Unremarkable. Normal arterial and venous flow. LEFT OVARY: Simple cysts are present, measuring up to 2.8 cm. Normal arterial and venous flow. FREE FLUID: No free fluid. ESTIMATED GESTATIONAL AGE BY LMP/PRIOR ULTRASOUND: 5 weeks 3 days. IMPRESSION: 1. No intrauterine gestational sac, yolk sac, embryo, or cardiac activity visualized. 2. Multiple uterine fibroids, including a submucosal fibroid posteriorly measuring 15 x 10 x 15 mm, a posterior subserosal fibroid measuring 16 x 14 x 18 mm, and a more anterior subserosal fibroid measuring 20 x 17 x 17 mm. 3. Simple cysts in the left ovary measuring up to 2.8 cm. Electronically signed by: Lonni Necessary MD 05/03/2024 09:17 AM EDT RP Workstation: [id number]     MDM Labs ordered and reviewed.   UA, UPT CBC, HCG ABO/Rh- O Pos Wet prep and gc/chlamydia US  OB Comp Less 14 weeks with Transvaginal  CNM independently reviewed the imaging ordered. Imaging show no IUP visualized, multiple fibroids present  Discussed with client the diagnosis of pregnancy of unknown anatomic location.  Three possibilities of outcome are: a healthy pregnancy that is too early to see  a yolk sac to confirm the pregnancy is in the uterus, a pregnancy that is not healthy and has not developed and will not develop, and an ectopic pregnancy that is in the abdomen that cannot be identified at this time.  And ectopic pregnancy can be a life threatening situation as a pregnancy needs to be in the uterus which is a muscle and can stretch to accommodate the growth of a pregnancy.  Other structures in the pelvis and abdomen as not muscular and do not stretch with the growth of a pregnancy.  Worst case scenario is that a structure ruptures with a growing pregnancy not in the uterus and and internal hemorrhage can be a life threatening situation.  We  need to follow the progression of this pregnancy carefully.  We need to check another serum pregnancy hormone level to determine if the levels are rising appropriately  and to determine the next steps that are needed for you. Patient's questions were answered.  Patient has appointment at [facility] on Monday 07/28 and can have HCG drawn at that time. Urgent message sent to provider and note made in chart.   Wet mount with positive clue cells but patient denies abnormal discharge or odor. No indication to treat for BV at this time.  Assessment and Plan  No diagnosis found.  -Discharge home in stable condition -Strict ectopic precautions discussed -Patient advised to follow-up with OB on Monday for repeat HCG -Patient may return to MAU as needed or if her condition were to change or worsen  Aleck CHRISTELLA Fireman, CNM 05/03/2024, 9:32 AM

## 2024-05-03 NOTE — MAU Note (Addendum)
 Margretta Zamorano is a 38 y.o. at Unknown here in MAU reporting: +HPT on Monday 04/28/24 LLQ pain that has been ongoing for 2-3 days that is constant. Vaginal discharge that started yesterday that was brown/thick and pt stated it resembled yeast consistency. Denies any recent sexual intercourse. Denies any vaginal itching, odor or pain. Pain is worse when she is sitting or lying down. States it feels better when she is up moving. States she usually has sciatic back pain and left leg pain that is normal for her but the lower abdominal cramping is new. Does not have an OB appointment scheduled yet.  LMP: June 18th 2025, unsure of exact date. Stated her cycles have been off pt thought she was in perimenopause  Onset of complaint: 2-3 days ago Pain score: 4 Vitals:   05/03/24 0740  BP: (!) 135/94  Pulse: 90  Resp: 17  Temp: 97.9 F (36.6 C)  SpO2: 98%     FHT:n/a Lab orders placed from triage:

## 2024-05-03 NOTE — Discharge Instructions (Signed)

## 2024-05-05 ENCOUNTER — Ambulatory Visit (INDEPENDENT_AMBULATORY_CARE_PROVIDER_SITE_OTHER): Payer: Self-pay | Admitting: Family Medicine

## 2024-05-05 ENCOUNTER — Ambulatory Visit: Payer: Self-pay | Admitting: Family Medicine

## 2024-05-05 VITALS — BP 115/83 | HR 97 | Ht 60.0 in | Wt 219.6 lb

## 2024-05-05 DIAGNOSIS — R109 Unspecified abdominal pain: Secondary | ICD-10-CM

## 2024-05-05 LAB — BETA HCG QUANT (REF LAB): hCG Quant: 2088 m[IU]/mL

## 2024-05-05 LAB — GC/CHLAMYDIA PROBE AMP (~~LOC~~) NOT AT ARMC
Chlamydia: NEGATIVE
Comment: NEGATIVE
Comment: NORMAL
Neisseria Gonorrhea: NEGATIVE

## 2024-05-05 NOTE — Addendum Note (Signed)
 Addended by: ELICIA HAMLET on: 05/05/2024 11:37 AM   Modules accepted: Orders

## 2024-05-05 NOTE — Progress Notes (Cosign Needed)
    SUBJECTIVE:   CHIEF COMPLAINT / HPI:   ED is a 38 year old [id number] that pf vaginbal bleeding f/u - Seen in MAU 7/26 for vaginal bleeding, found to have positive preg.  - Was initially having brown discharge before going to MAU, which has since resolved - However still having L sided abm pain, but improving. - Feels like the pain gets better when she moves around, worse when she is laying down - Normal bowel movements. No N/V/D.  - No dysuria - This pregnancy was planned    7/28 Staff Message from [facility]: This patient has an appointment with you on 7/28. I saw her today in MAU and she needs a repeat STAT HCG at this appointment to determine location of pregnancy. If it rises appropriately, she needs a repeat u/s in 10-14 days for viability. If not, she needs to be rescheduled for repeat STAT HCG on 7/30     OBJECTIVE:   BP 115/83   Pulse 97   Ht 5' (1.524 m)   Wt 219 lb 9.6 oz (99.6 kg)   LMP 03/23/2024   SpO2 99%   BMI 42.89 kg/m   General: Alert, pleasant woman. NAD. HEENT: NCAT. MMM. CV: RRR, no murmurs.   Resp: CTAB, no wheezing or crackles. Normal WOB on RA.  Abm: Soft, nontender, nondistended. BS present. Ext: Moves all ext spontaneously Skin: Warm, well perfused     ASSESSMENT/PLAN:   Assessment & Plan Abdominal cramping Improving since DC from MAU. C/f potential ectopic given nonvisualization of fetus on U/S. Abm reassuringly benign and symptoms improving.  - Repeat HCG today. HCG 1129 7/26. Expect it to increase to ~01-4999. - If abnormal, will need repeat HCG on 7/30 - If normal, will need repeat US  in 10-14 days.   - Counseled on Strict MAU precautions. Advised daily prenatal.    Twyla Nearing, MD [facility]

## 2024-05-05 NOTE — Patient Instructions (Signed)
 1) We will reach out to you via phone for your HCG results.   2) Please go back to the MAU if - you have new or worsening vaginal bleeding - your cramping worsens - you start vomiting uncontrollably

## 2024-05-06 ENCOUNTER — Telehealth: Payer: Self-pay

## 2024-05-06 NOTE — Telephone Encounter (Signed)
-----   Message from Twyla Nearing sent at 05/06/2024  8:53 AM EDT ----- Regarding: FW: OB Ultrasound She would prefer it to be on 8/8 if possible. ----- Message ----- From: Nearing Twyla, MD Sent: 05/06/2024   8:49 AM EDT To: [facility] Subject: OB Ultrasound                                  I have an ultrasound ordered for this patient. She need it from 8/7-8/11.

## 2024-05-06 NOTE — Addendum Note (Signed)
 Addended by: ELICIA HAMLET on: 05/06/2024 08:38 AM   Modules accepted: Orders

## 2024-05-06 NOTE — Telephone Encounter (Signed)
 Patient called nurse line this morning requesting letter confirming pregnancy. She needs this in order to apply for pregnancy Medicaid.   Spoke with Dr. Donzetta who advised that we could provide letter.   I called patient to advise of letter completion.   She is asking if she still needs to come in for appt tomorrow morning to recheck HCG. Originally she was told this was needed, however, later spoke with Dr. Elicia who advised normal HCG rise.   I advised patient that we could keep appointment for now and I would reach out to provider for clarification.   Chiquita JAYSON English, RN

## 2024-05-06 NOTE — Addendum Note (Signed)
 Addended by: DONZETTA QUANT E on: 05/06/2024 08:45 AM   Modules accepted: Orders

## 2024-05-06 NOTE — Telephone Encounter (Signed)
 Scheduled US  and pt informed. Shatarra Wehling [place], CMA

## 2024-05-06 NOTE — Telephone Encounter (Signed)
-----   Message from Aleck CHRISTELLA Fireman sent at 05/03/2024  9:40 AM EDT ----- Regarding: Appt 7/28 Hi!   This patient has an appointment with you on 7/28. I saw her today in MAU and she needs a repeat STAT HCG at this appointment to determine location of pregnancy. If it rises appropriately, she needs a repeat u/s in 10-14 days for viability. If not, she needs to be rescheduled for repeat STAT HCG on 7/30  Thanks!

## 2024-05-07 ENCOUNTER — Ambulatory Visit: Payer: Self-pay

## 2024-05-07 VITALS — BP 133/81 | HR 76 | Ht 65.0 in | Wt 223.6 lb

## 2024-05-07 DIAGNOSIS — Z3A01 Less than 8 weeks gestation of pregnancy: Secondary | ICD-10-CM

## 2024-05-07 NOTE — Patient Instructions (Signed)
 It was so good to see you today! Thank you for allowing me to take care of you.  Pregnancy Related Return Precautions The follow are signs/symptoms that are abnormal in pregnancy and may require further evaluation by a physician: Go to the MAU at [facility] if: You have cramping/contractions that do not go away with drinking water, especially if they are lasting 30 seconds to 1.5 minutes, coming and going every 5-10 minutes for an hour or more, or are getting stronger and you cannot walk or talk while having a contraction/cramp. You have vaginal bleeding.    You have a persistent headache that does not go away with 1 g of Tylenol , vision changes, chest pain, difficulty breathing, severe pain in your right upper abdomen, worsening leg swelling- these can all be signs of high blood pressure in pregnancy and need to be evaluated by a provider immediately  These are all concerning in pregnancy and if you have any of these I recommend you call your PCP and present to the Maternity Admissions Unit (map below) for further evaluation.  For any pregnancy-related emergencies, please go to the Maternity Admissions Unit in the [facility] at [facility]. You will use hospital Entrance C.    If you have any concerns, please call the clinic or schedule an appointment.  It was a pleasure to take care of you today. Be well!  Lauraine Norse, DO [facility], PGY-2  Do you need your medications delivered to your home?   We'll send your prescription to the [facility] for delivery.          Address: [address] [place], [place], [place] [address]          Phone: [phone]  Please call the [facility] to speak with a pharmacist and set up your home medication delivery. If you have any questions, feel free to contact us  -- we're happy to help!  Other [facility] that offer affordable prices on both  prescriptions and over-the-counter items, as well as convenient services like vaccinations, are  [facility], at [facility]         Address:  [address], [place], [place] [address]         Phone: [phone]  [facility], located in the [facility]        Address: [address] [place], [place] [address]        Phone: [phone]  [facility], at [facility]       Address: [address], [place], [place] [address]       Phone: [phone]  [facility], at [facility]       Address: [address], [address] Floor, [place], [place] [address]       Phone: [phone]

## 2024-05-07 NOTE — Progress Notes (Signed)
    SUBJECTIVE:   CHIEF COMPLAINT / HPI:   Need for HCG? On chart review HCG did rise appropriately and repeat HCG is not needed today (please see documentation and addendum by Dr. Elicia 7/28 and 7/29). She already has US  scheduled for 05/16/24. No vaginal bleeding, abdominal pain/cramping, or any other concerns today.  PERTINENT  PMH / PSH: Reviewed.  OBJECTIVE:   BP 133/81   Pulse 76   Ht 5' 5 (1.651 m)   Wt 223 lb 9.6 oz (101.4 kg)   LMP 03/23/2024   SpO2 100%   BMI 37.21 kg/m   General: well-appearing, no acute distress. HEENT: normocephalic, EOM grossly intact. Pulm: No increased work of breathing. Extremities: no peripheral edema. Moves all extremities equally. Neuro: Alert and oriented x3. Psych:  Cognition and judgment appear intact.   ASSESSMENT/PLAN:   Assessment & Plan Less than [redacted] weeks gestation of pregnancy Repeat HCG not necessary today given appropriate rise on last check. Patient is doing well overall, no concerns at this time. - US  scheduled for 8/8 - return precautions given    Lauraine Norse, DO [facility]

## 2024-05-16 ENCOUNTER — Ambulatory Visit (HOSPITAL_COMMUNITY)
Admission: RE | Admit: 2024-05-16 | Discharge: 2024-05-16 | Disposition: A | Discharge status: Home / Self Care | Payer: Self-pay | Source: Ambulatory Visit | Attending: Family Medicine | Admitting: Family Medicine

## 2024-05-16 ENCOUNTER — Telehealth: Payer: Self-pay

## 2024-05-16 DIAGNOSIS — R109 Unspecified abdominal pain: Secondary | ICD-10-CM | POA: Insufficient documentation

## 2024-05-16 NOTE — Telephone Encounter (Signed)
 Patient calls nurse line regarding concerns post US  today.   She reports that prior to ultrasound, she had a small amount of brown vaginal discharge.   She reports after wiping about 1 hour ago, she noticed light pink spotting. She denies pelvic or abdominal pain or bright red blood.   US  results are still pending.   Spoke with Dr. Donzetta. After review of chart, recommended MAU evaluation. Advised patient of MAU recommendation. Patient reports that she is going to monitor her symptoms at home for now and that if she begins to experience pain or increase in bleeding, she will go to MAU.   Chiquita JAYSON English, RN

## 2024-05-17 ENCOUNTER — Telehealth: Payer: Self-pay | Admitting: Family Medicine

## 2024-05-17 NOTE — Telephone Encounter (Signed)
 AFTER HOURS EMERGENCY LINE Patient called after-hours line reporting a pinkish red-tinged vaginal discharge for the last few days.  She states that this is only noticeable when she wipes, there is no bright red blood or significant discharge in her underwear.  She denies pelvic pain or cramping.  It looks like she called our office yesterday and was advised to go to the MAU.  Patient states that she did not go to the MAU she wanted to wait and see if it got better. Ultrasound yesterday resulted with live IUP estimated 5 weeks 6 days gestation along with multiple fibroids.  Advised that patient could either go to the MAU today and be evaluated, or follow-up in our office on Monday with the contingency that she will go to the MAU if she develops any bright red bleeding, heavier bleeding, or abdominal pain.  Patient would like to wait until Monday to be seen.  I have scheduled her an access to care at that time and strongly advised her to go to the MAU prior to that appointment if anything changes.  Patient is comfortable with plan.

## 2024-05-19 ENCOUNTER — Ambulatory Visit: Payer: Self-pay

## 2024-05-23 ENCOUNTER — Inpatient Hospital Stay (HOSPITAL_COMMUNITY): Payer: Self-pay

## 2024-05-23 ENCOUNTER — Encounter (HOSPITAL_COMMUNITY): Payer: Self-pay | Admitting: Obstetrics & Gynecology

## 2024-05-23 ENCOUNTER — Inpatient Hospital Stay (HOSPITAL_COMMUNITY)
Admission: AD | Admit: 2024-05-23 | Discharge: 2024-05-23 | Disposition: A | Discharge status: Home / Self Care | Payer: Self-pay | Attending: Obstetrics & Gynecology | Admitting: Obstetrics & Gynecology

## 2024-05-23 DIAGNOSIS — O209 Hemorrhage in early pregnancy, unspecified: Secondary | ICD-10-CM

## 2024-05-23 DIAGNOSIS — O3680X0 Pregnancy with inconclusive fetal viability, not applicable or unspecified: Secondary | ICD-10-CM | POA: Insufficient documentation

## 2024-05-23 DIAGNOSIS — O3680X1 Pregnancy with inconclusive fetal viability, fetus 1: Secondary | ICD-10-CM

## 2024-05-23 DIAGNOSIS — Z3A08 8 weeks gestation of pregnancy: Secondary | ICD-10-CM

## 2024-05-23 HISTORY — DX: Urinary tract infection, site not specified: N39.0

## 2024-05-23 HISTORY — DX: Benign neoplasm of connective and other soft tissue, unspecified: D21.9

## 2024-05-23 LAB — CBC
HCT: 35.1 % — ABNORMAL LOW (ref 36.0–46.0)
Hemoglobin: 11 g/dL — ABNORMAL LOW (ref 12.0–15.0)
MCH: 26.3 pg (ref 26.0–34.0)
MCHC: 31.3 g/dL (ref 30.0–36.0)
MCV: 84 fL (ref 80.0–100.0)
Platelets: 233 K/uL (ref 150–400)
RBC: 4.18 MIL/uL (ref 3.87–5.11)
RDW: 13.4 % (ref 11.5–15.5)
WBC: 7.5 K/uL (ref 4.0–10.5)
nRBC: 0 % (ref 0.0–0.2)

## 2024-05-23 LAB — WET PREP, GENITAL
Sperm: NONE SEEN
Trich, Wet Prep: NONE SEEN
WBC, Wet Prep HPF POC: >=10 — AB (ref <10)
Yeast Wet Prep HPF POC: NONE SEEN

## 2024-05-23 LAB — HCG, QUANTITATIVE, PREGNANCY: hCG, Beta Chain, Quant, S: 44199 m[IU]/mL — ABNORMAL HIGH (ref <5)

## 2024-05-23 NOTE — MAU Note (Signed)
 Christine Cervantes is a 38 y.o. at [redacted]w[redacted]d here in MAU reporting: has been having pink spotting for past wk and a half. Has been off and on, sees it when she wipes. Scares her  as this is how a previous SAB started. No pain at this time, has had some mild cramps.   Onset of complaint: ~10 days ago Pain score: none Vitals:   05/23/24 0833  BP: 137/86  Pulse: 67  Resp: 17  Temp: 98.5 F (36.9 C)  SpO2: 100%      Lab orders placed from triage:  vag swabs

## 2024-05-23 NOTE — MAU Provider Note (Signed)
 Chief Complaint: Vaginal Bleeding   None     SUBJECTIVE HPI: Christine Cervantes is a 38 y.o. [id number] at [redacted]w[redacted]d by LMP who presents to maternity admissions reporting vaginal bleeding off and on x 1 week. Bleeding stopped for a few days but started again today.   She denies vaginal itching/burning, urinary symptoms, h/a, dizziness, n/v, or fever/chills.     HPI  Past Medical History:  Diagnosis Date   Anemia    Depression    Fibroid    Headache(784.0)    Pregnancy induced hypertension    no meds   SAB (spontaneous abortion) 03/16/2021   UTI (urinary tract infection)    Vaginal Pap smear, abnormal    Past Surgical History:  Procedure Laterality Date   BREAST REDUCTION SURGERY Bilateral 03/12/2019   Procedure: BILATERAL MAMMARY REDUCTION  (BREAST);  Surgeon: Lowery Estefana RAMAN, DO;  Location: [facility];  Service: Plastics;  Laterality: Bilateral;  3.5 hours, please   FOOT SURGERY  2004-2004   corns removed from both feet, hammertoes repaired   GYNECOLOGIC CRYOSURGERY     HAMMER TOE SURGERY     LIPOSUCTION     REDUCTION MAMMAPLASTY Bilateral 03/12/2019   Social History   Socioeconomic History   Marital status: Single    Spouse name: Not on file   Number of children: Not on file   Years of education: Not on file   Highest education level: Not on file  Occupational History   Not on file  Tobacco Use   Smoking status: Never    Passive exposure: Never   Smokeless tobacco: Never  Vaping Use   Vaping status: Never Used  Substance and Sexual Activity   Alcohol use: Not Currently    Comment: occ before preg   Drug use: No   Sexual activity: Yes    Birth control/protection: None  Other Topics Concern   Not on file  Social History Narrative   Not on file   Social Drivers of Health   Financial Resource Strain: Not on file  Food Insecurity: No Food Insecurity (04/23/2023)   Hunger Vital Sign    Worried About Running Out of Food in the Last Year: Never true     Ran Out of Food in the Last Year: Never true  Transportation Needs: No Transportation Needs (04/23/2023)   PRAPARE - Administrator, Civil Service (Medical): No    Lack of Transportation (Non-Medical): No  Physical Activity: Not on file  Stress: Not on file  Social Connections: Not on file  Intimate Partner Violence: Not on file   No current facility-administered medications on file prior to encounter.   Current Outpatient Medications on File Prior to Encounter  Medication Sig Dispense Refill   Ascorbic Acid (VITAMIN C PO) Take 1 tablet by mouth daily.     buPROPion  (WELLBUTRIN  XL) 150 MG 24 hr tablet Take 1 tablet (150 mg total) by mouth daily. 30 tablet 0   Prenatal Vit-Fe Fumarate-FA (PRENATAL MULTIVITAMIN) TABS tablet Take 1 tablet by mouth daily at 12 noon.     [DISCONTINUED] Ferrous Sulfate  (IRON  PO) Take by mouth daily. Liquid OTC iron  not sure of dose     Allergies  Allergen Reactions   Latex Swelling and Rash    Vaginally....no problems with any other latex products- more than likely is the lubricant or spermicide    ROS:  Review of Systems  Constitutional:  Negative for chills, fatigue and fever.  HENT:  Negative for  sinus pressure.   Eyes:  Negative for photophobia.  Respiratory:  Negative for shortness of breath.   Cardiovascular:  Negative for chest pain.  Gastrointestinal:  Negative for abdominal pain, nausea and vomiting.  Genitourinary:  Positive for vaginal bleeding. Negative for difficulty urinating, dysuria, flank pain, frequency, pelvic pain, vaginal discharge and vaginal pain.  Musculoskeletal:  Negative for neck pain.  Neurological:  Negative for dizziness, weakness and headaches.  Psychiatric/Behavioral: Negative.       I have reviewed patient's Past Medical Hx, Surgical Hx, Family Hx, Social Hx, medications and allergies.   Physical Exam  Patient Vitals for the past 24 hrs:  BP Temp Temp src Pulse Resp SpO2 Height Weight  05/23/24  0833 137/86 98.5 F (36.9 C) Oral 67 17 100 % 5' 5 (1.651 m) 100.2 kg   Constitutional: Well-developed, well-nourished female in no acute distress.  Cardiovascular: normal rate Respiratory: normal effort GI: Abd soft, non-tender. Pos BS x 4 MS: Extremities nontender, no edema, normal ROM Neurologic: Alert and oriented x 4.  GU: Neg CVAT.  PELVIC EXAM: deferred  LAB RESULTS Results for orders placed or performed during the hospital encounter of 05/23/24 (from the past 24 hours)  CBC     Status: Abnormal   Collection Time: 05/23/24  8:55 AM  Result Value Ref Range   WBC 7.5 4.0 - 10.5 K/uL   RBC 4.18 3.87 - 5.11 MIL/uL   Hemoglobin 11.0 (L) 12.0 - 15.0 g/dL   HCT 64.8 (L) 63.9 - 53.9 %   MCV 84.0 80.0 - 100.0 fL   MCH 26.3 26.0 - 34.0 pg   MCHC 31.3 30.0 - 36.0 g/dL   RDW 86.5 88.4 - 84.4 %   Platelets 233 150 - 400 K/uL   nRBC 0.0 0.0 - 0.2 %  hCG, quantitative, pregnancy     Status: Abnormal   Collection Time: 05/23/24  8:55 AM  Result Value Ref Range   hCG, Beta Chain, Quant, S 44,199 (H) <5 mIU/mL  Wet prep, genital     Status: Abnormal   Collection Time: 05/23/24  9:31 AM  Result Value Ref Range   Yeast Wet Prep HPF POC NONE SEEN NONE SEEN   Trich, Wet Prep NONE SEEN NONE SEEN   Clue Cells Wet Prep HPF POC PRESENT (A) NONE SEEN   WBC, Wet Prep HPF POC >=10 (A) <10   Sperm NONE SEEN     --/--/O POS (07/26 0805)  IMAGING US  OB Transvaginal Result Date: 05/23/2024 CLINICAL DATA:  Vaginal bleeding EXAM: TRANSVAGINAL OB ULTRASOUND TECHNIQUE: Transvaginal ultrasound was performed for complete evaluation of the gestation as well as the maternal uterus, adnexal regions, and pelvic cul-de-sac. COMPARISON:  Obstetric ultrasound examination dated 05/16/2024 FINDINGS: Intrauterine gestational sac: Single Yolk sac:  Visualized. Embryo:  Visualized. Cardiac Activity: Not Visualized. CRL:   4.5 mm   6 w 1 d                  US  EDC: 01/15/2025 Subchorionic hemorrhage:  None  visualized. Maternal uterus/adnexae: Normal right ovary. Left ovary is not seen. Heterogeneous myometrium containing multiple hypoechoic lesions, likely leiomyomata. IMPRESSION: Single intrauterine pregnancy with findings of pregnancy failure. Cardiac activity is no longer seen. Electronically Signed   By: Limin  Xu M.D.   On: 05/23/2024 10:13   US  OB Comp Less 14 Wks Result Date: 05/16/2024 CLINICAL DATA:  pregnancy of unknown location EXAM: OBSTETRIC <14 WK US  AND TRANSVAGINAL OB US  TECHNIQUE: Both transabdominal and transvaginal ultrasound  examinations were performed for complete evaluation of the gestation as well as the maternal uterus, adnexal regions, and pelvic cul-de-sac. Transvaginal technique was performed to assess early pregnancy. COMPARISON:  May 03, 2024 FINDINGS: Intrauterine gestational sac: Single Yolk sac:  Present Fetal Pole:  Present Cardiac Activity: Present Heart Rate: 173 bpm CRL: 25.8 mm 5w 6d                US  EDC: 12/31/2024 Subchorionic hemorrhage:  None visualized. Maternal uterus/adnexae: Multiple fibroids again noted. For example, in the posterior uterine body, there is a 2 x 1.2 x 1.5 cm submucosal fibroid. Posterior uterine fundal fibroid measuring 1.7 x 1.3 x 1.5 cm. Anterior uterine body fibroid measuring 1.6 x 1.5 x 1.4 cm. dominant follicle in the right ovary measuring 2 cm. Small corpus luteum measuring 1.5 cm. The left ovary was not well visualized or evaluated. No free pelvic fluid. IMPRESSION: 1. Single, live intrauterine gestation with an estimated gestational age of [redacted] weeks, 6 days. Fetal heart rate of 173 beats per minute. Continued routine obstetric and sonographic follow-up is recommended. 2. Multiple uterine fibroids again noted. Likely submucosal fibroid in the posterior uterine body measuring 2 x 1.2 x 1.5 cm. Electronically Signed   By: Rogelia Myers M.D.   On: 05/16/2024 23:23   US  OB LESS THAN 14 WEEKS WITH OB TRANSVAGINAL Result Date: 05/03/2024 EXAM:  ULTRASOUND FIRST TRIMESTER CLINICAL HISTORY: Positive pregnancy test 4 days ago. Abdominal pain. TECHNIQUE: Transabdominal and transvaginal first trimester obstetric pelvic duplex ultrasound was performed with real-time imaging, color flow Doppler imaging, and spectral analysis. COMPARISON: Pelvic ultrasound 03/15/2021. FINDINGS: UTERUS: Multiple uterine fibroids are noted. A submucosal fibroid posteriorly measures 15 x 10 x 15 mm. A posterior subserosal fibroid measures 16 x 14 x 18 mm. A more anterior subserosal fibroid measures 20 x 17 x 17 mm. GESTATIONAL SAC(S): No intrauterine gestational sac visualized. YOLK SAC: No yolk sac visualized. EMBRYO(<11WK) /FETUS(>=11WK): No embryo or cardiac activity visualized. RIGHT OVARY: Unremarkable. Normal arterial and venous flow. LEFT OVARY: Simple cysts are present, measuring up to 2.8 cm. Normal arterial and venous flow. FREE FLUID: No free fluid. ESTIMATED GESTATIONAL AGE BY LMP/PRIOR ULTRASOUND: 5 weeks 3 days. IMPRESSION: 1. No intrauterine gestational sac, yolk sac, embryo, or cardiac activity visualized. 2. Multiple uterine fibroids, including a submucosal fibroid posteriorly measuring 15 x 10 x 15 mm, a posterior subserosal fibroid measuring 16 x 14 x 18 mm, and a more anterior subserosal fibroid measuring 20 x 17 x 17 mm. 3. Simple cysts in the left ovary measuring up to 2.8 cm. Electronically signed by: Lonni Necessary MD 05/03/2024 09:17 AM EDT RP Workstation: [id number]    MAU Management/MDM: Orders Placed This Encounter  Procedures   Wet prep, genital   US  OB Transvaginal   CBC   hCG, quantitative, pregnancy   Discharge patient Discharge disposition: 01-Home or Self Care; Discharge patient date: 05/23/2024   Discharge patient Discharge disposition: 01-Home or Self Care; Discharge patient date: 05/23/2024    No orders of the defined types were placed in this encounter.   Pt had US  with cardiac activity on 05/16/24 and this cardiac activity is not  seen on US  today.  Reviewed results with patient as evidence of miscarriage.  Pt desires a follow up ultrasound as she is having light bleeding only at this time and minimal pain.  Outpatient US  ordered in 1 week.  Return/miscarriage precautions given.    ASSESSMENT 1. Pregnancy with inconclusive fetal viability, fetus 1  of multiple gestation   2. [redacted] weeks gestation of pregnancy   3. Vaginal bleeding affecting early pregnancy     PLAN Discharge home Allergies as of 05/23/2024       Reactions   Latex Swelling, Rash   Vaginally....no problems with any other latex products- more than likely is the lubricant or spermicide        Medication List     TAKE these medications    buPROPion  150 MG 24 hr tablet Commonly known as: WELLBUTRIN  XL Take 1 tablet (150 mg total) by mouth daily.   prenatal multivitamin Tabs tablet Take 1 tablet by mouth daily at 12 noon.   VITAMIN C PO Take 1 tablet by mouth daily.        Follow-up Information     [facility] Follow up.   Specialty: Obstetrics and Gynecology Why: As needed for emergencies Contact information: [address] [place]  [address] [phone]                Olam Boards Certified Nurse-Midwife 05/23/2024  10:08 PM

## 2024-05-26 LAB — GC/CHLAMYDIA PROBE AMP (~~LOC~~) NOT AT ARMC
Chlamydia: NEGATIVE
Comment: NEGATIVE
Comment: NORMAL
Neisseria Gonorrhea: NEGATIVE

## 2024-05-28 ENCOUNTER — Ambulatory Visit (INDEPENDENT_AMBULATORY_CARE_PROVIDER_SITE_OTHER): Payer: Self-pay | Admitting: Student

## 2024-05-28 VITALS — BP 121/84 | HR 72 | Ht 65.0 in | Wt 219.8 lb

## 2024-05-28 DIAGNOSIS — Z3490 Encounter for supervision of normal pregnancy, unspecified, unspecified trimester: Secondary | ICD-10-CM

## 2024-05-28 NOTE — Patient Instructions (Signed)
 Pleasure to see you today.  As discussed please make sure to complete your ultrasound on the 22nd.  And schedule a follow-up with us  in 1 week or the 27-28th to discuss result and plan   Please if you start developing any fevers, chills severe abdominal pain, bleeding please go to the MAU/mother-baby emergency units to be evaluated.

## 2024-05-28 NOTE — Progress Notes (Signed)
    SUBJECTIVE:   CHIEF COMPLAINT / HPI:   The patient is a 38 year old female G29P222 with a history of high-risk pregnancies who presents for follow-up on HCG levels and ultrasound findings.  She had a positive pregnancy test and an initial ultrasound on August 8th, showing an intrauterine pregnancy with a heart rate of 173 bpm. A follow-up ultrasound on August 13th, after experiencing pinkish vaginal discharge, revealed an intrauterine sac without detectable heart tones. There is a discrepancy in CRL measurements between the two ultrasounds, with the first at 25.8 mm and the second at 4.5 mm.  HCG levels were 11,000 three weeks ago and increased to 44,000 five days ago. She is concerned about whether the levels have risen further or indicate complications.  She has experienced two spontaneous miscarriages at six to seven weeks of gestation. This pregnancy was unplanned but not prevented. She is anxious about the lack of follow-up communication from the initial ultrasound and the current situation.  She experiences pinkish vaginal discharge but denies any current bleeding or cramping.  PERTINENT  PMH / PSH: Reviewed   OBJECTIVE:   BP 121/84   Pulse 72   Wt 219 lb 12.8 oz (99.7 kg)   LMP 03/23/2024   SpO2 100%   BMI 36.58 kg/m    Physical Exam General: Alert, well appearing, NAD Cardiovascular: RRR, No Murmurs, Normal S2/S2 Respiratory: CTAB, No wheezing or Rales Abdomen: No distension or tenderness :Psych: Pleasant and Normal affect   ASSESSMENT/PLAN:   Intrauterine pregnancy Initial ultrasound showed fetal heart rate of 173 bpm. Subsequent ultrasound showed intrauterine sac without heart tones, indicating possible fetal demise. CRL measurements decreased significantly. Ultrasound remains the most reliable test at this stage. - Agree with follow-up ultrasound on August 22 to confirm findings. - Discussed potential outcomes and next steps based on ultrasound results -Follow  up in a week to discuss plans after repeat US   Kahlen Morais, MD [facility]

## 2024-05-30 ENCOUNTER — Other Ambulatory Visit (INDEPENDENT_AMBULATORY_CARE_PROVIDER_SITE_OTHER): Payer: Self-pay

## 2024-05-30 ENCOUNTER — Ambulatory Visit (INDEPENDENT_AMBULATORY_CARE_PROVIDER_SITE_OTHER): Payer: Self-pay | Admitting: Family Medicine

## 2024-05-30 VITALS — BP 133/84 | HR 83 | Wt 223.0 lb

## 2024-05-30 DIAGNOSIS — O021 Missed abortion: Secondary | ICD-10-CM

## 2024-05-30 DIAGNOSIS — Z3481 Encounter for supervision of other normal pregnancy, first trimester: Secondary | ICD-10-CM

## 2024-05-30 DIAGNOSIS — O3680X1 Pregnancy with inconclusive fetal viability, fetus 1: Secondary | ICD-10-CM

## 2024-05-30 DIAGNOSIS — Z3A01 Less than 8 weeks gestation of pregnancy: Secondary | ICD-10-CM

## 2024-05-30 DIAGNOSIS — O0289 Other abnormal products of conception: Secondary | ICD-10-CM

## 2024-05-30 NOTE — Progress Notes (Signed)
 Pt here for follow up US  from her last MAU visit -no FHR was noted and this is to re confirm, as pt had only some spotting.   Denies any vaginal bleeding or cramping today   Patient informed that the ultrasound is considered a limited obstetric ultrasound and is not intended to be a complete ultrasound exam.  Patient also informed that the ultrasound is not being completed with the intent of assessing for fetal or placental anomalies or any pelvic abnormalities. Explained that the purpose of today's ultrasound is to assess for fetal heart rate.  Patient acknowledges the purpose of the exam and the limitations of the study.        Christine Buckles, RN
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
## 2024-06-03 ENCOUNTER — Other Ambulatory Visit (HOSPITAL_COMMUNITY): Payer: Self-pay

## 2024-06-03 ENCOUNTER — Telehealth: Payer: Self-pay

## 2024-06-04 ENCOUNTER — Other Ambulatory Visit (HOSPITAL_COMMUNITY): Payer: Self-pay

## 2024-06-04 ENCOUNTER — Ambulatory Visit: Payer: Self-pay | Admitting: Family Medicine

## 2024-06-04 MED ORDER — LORAZEPAM 0.5 MG PO TABS
0.5000 mg | ORAL_TABLET | Freq: Three times a day (TID) | ORAL | 0 refills | Status: AC
  Filled 2024-06-04: qty 3, 1d supply, fill #0

## 2024-06-04 MED ORDER — OXYCODONE-ACETAMINOPHEN 5-325 MG PO TABS: 1.0000 | ORAL_TABLET | Freq: Four times a day (QID) | ORAL | 0 refills | Status: DC | PRN

## 2024-06-04 MED ORDER — DOXYCYCLINE HYCLATE 100 MG PO CAPS
200.0000 mg | ORAL_CAPSULE | Freq: Once | ORAL | 0 refills | Status: AC
Start: 2024-06-04 — End: 2024-06-14
  Filled 2024-06-04: qty 2, 1d supply, fill #0

## 2024-06-05 ENCOUNTER — Other Ambulatory Visit: Payer: Self-pay

## 2024-06-05 ENCOUNTER — Telehealth: Payer: Self-pay | Admitting: Family Medicine

## 2024-06-05 NOTE — Telephone Encounter (Signed)
**  After Hours/ Emergency Line Call**  Received a page to call [phone]) - [phone].  Patient: Christine Cervantes  Caller: Self  Confirmed name & DOB of patient with caller  Subjective:  Patient calls after hours line with concerns nausea and one episode of vomiting today. She has also had some overall malaise the past couple of days.  Reports she is able to PO liquids. Denies fevers, lightheadness, dizziness or feelings like she is going to past out. No abdominal pain or vaginal bleeding.  Objective:  Observations: NAD, comfortably talking over the phone,   Assessment & Plan  Christine Cervantes is a 38 y.o. female with PMHx s/f non viable pregnancy scheduled for D&E on 09/05 who calls with the following complaints and concerns:   Nausea Suspect associated with her non-viable pregnancy. No red flags indicating need for immediate evaluation in the MAU.   Recommendations:  Offered appointment tomorrow AM, patient declined, stating she would observe for symptoms we discussed above. If worsening recommended to call back or go to the MAU, to which she verbalized understanding.   -- Red flags discussed.   -- Will forward to PCP.  Christine Provencal, MD, PGY-3 [facility] 9:50 PM 06/05/2024

## 2024-06-11 ENCOUNTER — Encounter: Payer: Self-pay | Admitting: Obstetrics and Gynecology

## 2024-06-13 ENCOUNTER — Encounter: Payer: Self-pay | Admitting: Family Medicine

## 2024-06-13 ENCOUNTER — Other Ambulatory Visit (HOSPITAL_COMMUNITY)
Admission: RE | Admit: 2024-06-13 | Discharge: 2024-06-13 | Disposition: A | Discharge status: Home / Self Care | Payer: Self-pay | Source: Ambulatory Visit | Attending: Family Medicine | Admitting: Family Medicine

## 2024-06-13 ENCOUNTER — Ambulatory Visit (INDEPENDENT_AMBULATORY_CARE_PROVIDER_SITE_OTHER): Payer: Self-pay | Admitting: Family Medicine

## 2024-06-13 ENCOUNTER — Other Ambulatory Visit: Payer: Self-pay

## 2024-06-13 ENCOUNTER — Other Ambulatory Visit (HOSPITAL_COMMUNITY): Payer: Self-pay

## 2024-06-13 VITALS — BP 136/86 | HR 83 | Wt 221.0 lb

## 2024-06-13 DIAGNOSIS — O0289 Other abnormal products of conception: Secondary | ICD-10-CM

## 2024-06-13 DIAGNOSIS — O021 Missed abortion: Secondary | ICD-10-CM | POA: Insufficient documentation

## 2024-06-13 DIAGNOSIS — Z3A01 Less than 8 weeks gestation of pregnancy: Secondary | ICD-10-CM

## 2024-06-13 MED ORDER — OXYCODONE-ACETAMINOPHEN 5-325 MG PO TABS
1.0000 | ORAL_TABLET | Freq: Four times a day (QID) | ORAL | 0 refills | Status: AC | PRN
  Filled 2024-06-13: qty 5, 1d supply, fill #0
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
## 2024-06-16 LAB — SURGICAL PATHOLOGY

## 2024-06-17 ENCOUNTER — Ambulatory Visit: Payer: Self-pay | Admitting: Family Medicine

## 2024-06-18 ENCOUNTER — Telehealth: Payer: Self-pay | Admitting: *Deleted
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
## 2024-06-20 ENCOUNTER — Encounter: Payer: Self-pay | Admitting: *Deleted

## 2024-06-24 ENCOUNTER — Other Ambulatory Visit (HOSPITAL_COMMUNITY): Payer: Self-pay

## 2024-09-24 ENCOUNTER — Encounter: Payer: Self-pay | Admitting: Obstetrics and Gynecology
